# Patient Record
Sex: Female | Born: 1940 | Race: White | Hispanic: No | Smoking: Former smoker
Health system: Southern US, Community
[De-identification: ages and names within clinical notes are randomized; demographics above are authoritative.]

## PROBLEM LIST (undated history)

## (undated) ENCOUNTER — Emergency Department (HOSPITAL_COMMUNITY): Admission: EM | Payer: Medicare Other | Source: Home / Self Care

## (undated) DIAGNOSIS — F32A Depression, unspecified: Secondary | ICD-10-CM

## (undated) DIAGNOSIS — Z8709 Personal history of other diseases of the respiratory system: Secondary | ICD-10-CM

## (undated) DIAGNOSIS — I00 Rheumatic fever without heart involvement: Secondary | ICD-10-CM

## (undated) DIAGNOSIS — J189 Pneumonia, unspecified organism: Secondary | ICD-10-CM

## (undated) DIAGNOSIS — N289 Disorder of kidney and ureter, unspecified: Secondary | ICD-10-CM

## (undated) DIAGNOSIS — Z8669 Personal history of other diseases of the nervous system and sense organs: Secondary | ICD-10-CM

## (undated) DIAGNOSIS — Z860100 Personal history of colon polyps, unspecified: Secondary | ICD-10-CM

## (undated) DIAGNOSIS — M11261 Other chondrocalcinosis, right knee: Secondary | ICD-10-CM

## (undated) DIAGNOSIS — D649 Anemia, unspecified: Secondary | ICD-10-CM

## (undated) DIAGNOSIS — R062 Wheezing: Secondary | ICD-10-CM

## (undated) DIAGNOSIS — F329 Major depressive disorder, single episode, unspecified: Secondary | ICD-10-CM

## (undated) DIAGNOSIS — E785 Hyperlipidemia, unspecified: Secondary | ICD-10-CM

## (undated) DIAGNOSIS — M1990 Unspecified osteoarthritis, unspecified site: Secondary | ICD-10-CM

## (undated) DIAGNOSIS — F419 Anxiety disorder, unspecified: Secondary | ICD-10-CM

## (undated) DIAGNOSIS — R42 Dizziness and giddiness: Secondary | ICD-10-CM

## (undated) DIAGNOSIS — M2550 Pain in unspecified joint: Secondary | ICD-10-CM

## (undated) DIAGNOSIS — H269 Unspecified cataract: Secondary | ICD-10-CM

## (undated) DIAGNOSIS — I10 Essential (primary) hypertension: Secondary | ICD-10-CM

## (undated) HISTORY — PX: EYE SURGERY: SHX253

## (undated) HISTORY — PX: OTHER SURGICAL HISTORY: SHX169

## (undated) HISTORY — PX: CATARACT EXTRACTION: SUR2

## (undated) HISTORY — PX: ABDOMINAL HYSTERECTOMY: SHX81

## (undated) HISTORY — PX: CHOLECYSTECTOMY: SHX55

## (undated) HISTORY — PX: EXPLORATORY LAPAROTOMY: SUR591

## (undated) HISTORY — PX: CARDIAC CATHETERIZATION: SHX172

## (undated) HISTORY — PX: APPENDECTOMY: SHX54

---

## 1998-12-02 ENCOUNTER — Inpatient Hospital Stay (HOSPITAL_COMMUNITY): Admission: EM | Admit: 1998-12-02 | Discharge: 1998-12-03 | Payer: Self-pay | Admitting: Cardiology

## 2000-07-28 ENCOUNTER — Encounter: Payer: Self-pay | Admitting: Internal Medicine

## 2000-07-28 ENCOUNTER — Ambulatory Visit (HOSPITAL_COMMUNITY): Admission: RE | Admit: 2000-07-28 | Discharge: 2000-07-28 | Payer: Self-pay | Admitting: Internal Medicine

## 2000-12-19 ENCOUNTER — Ambulatory Visit (HOSPITAL_COMMUNITY): Admission: RE | Admit: 2000-12-19 | Discharge: 2000-12-19 | Payer: Self-pay | Admitting: Specialist

## 2000-12-19 ENCOUNTER — Encounter: Payer: Self-pay | Admitting: Specialist

## 2002-10-30 ENCOUNTER — Encounter: Payer: Self-pay | Admitting: Family Medicine

## 2002-10-30 ENCOUNTER — Ambulatory Visit (HOSPITAL_COMMUNITY): Admission: RE | Admit: 2002-10-30 | Discharge: 2002-10-30 | Payer: Self-pay | Admitting: Family Medicine

## 2003-01-03 ENCOUNTER — Ambulatory Visit (HOSPITAL_COMMUNITY): Admission: RE | Admit: 2003-01-03 | Discharge: 2003-01-03 | Payer: Self-pay | Admitting: Neurology

## 2003-01-27 ENCOUNTER — Ambulatory Visit (HOSPITAL_COMMUNITY): Admission: RE | Admit: 2003-01-27 | Discharge: 2003-01-27 | Payer: Self-pay | Admitting: Family Medicine

## 2003-01-30 ENCOUNTER — Encounter: Admission: RE | Admit: 2003-01-30 | Discharge: 2003-01-30 | Payer: Self-pay | Admitting: Family Medicine

## 2003-02-15 HISTORY — PX: COLONOSCOPY: SHX174

## 2003-07-08 ENCOUNTER — Ambulatory Visit (HOSPITAL_COMMUNITY): Admission: RE | Admit: 2003-07-08 | Discharge: 2003-07-08 | Payer: Self-pay | Admitting: Family Medicine

## 2003-12-03 ENCOUNTER — Ambulatory Visit (HOSPITAL_COMMUNITY): Admission: RE | Admit: 2003-12-03 | Discharge: 2003-12-03 | Payer: Self-pay | Admitting: Internal Medicine

## 2003-12-12 ENCOUNTER — Ambulatory Visit (HOSPITAL_COMMUNITY): Admission: RE | Admit: 2003-12-12 | Discharge: 2003-12-12 | Payer: Self-pay | Admitting: Internal Medicine

## 2004-01-01 ENCOUNTER — Ambulatory Visit: Payer: Self-pay | Admitting: Internal Medicine

## 2004-03-09 ENCOUNTER — Ambulatory Visit: Payer: Self-pay | Admitting: Cardiology

## 2004-04-14 ENCOUNTER — Ambulatory Visit: Payer: Self-pay | Admitting: Cardiology

## 2004-05-12 ENCOUNTER — Ambulatory Visit (HOSPITAL_COMMUNITY): Admission: RE | Admit: 2004-05-12 | Discharge: 2004-05-12 | Payer: Self-pay | Admitting: Urology

## 2004-09-13 ENCOUNTER — Ambulatory Visit (HOSPITAL_COMMUNITY): Admission: RE | Admit: 2004-09-13 | Discharge: 2004-09-13 | Payer: Self-pay | Admitting: Family Medicine

## 2004-12-23 ENCOUNTER — Ambulatory Visit: Payer: Self-pay | Admitting: Orthopedic Surgery

## 2004-12-27 ENCOUNTER — Ambulatory Visit (HOSPITAL_COMMUNITY): Admission: RE | Admit: 2004-12-27 | Discharge: 2004-12-27 | Payer: Self-pay | Admitting: Family Medicine

## 2005-02-09 ENCOUNTER — Ambulatory Visit (HOSPITAL_COMMUNITY): Admission: RE | Admit: 2005-02-09 | Discharge: 2005-02-09 | Payer: Self-pay | Admitting: Family Medicine

## 2005-07-08 ENCOUNTER — Ambulatory Visit: Payer: Self-pay | Admitting: Cardiology

## 2005-07-22 ENCOUNTER — Ambulatory Visit: Payer: Self-pay

## 2005-09-30 ENCOUNTER — Ambulatory Visit (HOSPITAL_COMMUNITY): Admission: RE | Admit: 2005-09-30 | Discharge: 2005-09-30 | Payer: Self-pay | Admitting: Family Medicine

## 2005-11-14 ENCOUNTER — Ambulatory Visit (HOSPITAL_COMMUNITY): Admission: RE | Admit: 2005-11-14 | Discharge: 2005-11-14 | Payer: Self-pay | Admitting: Family Medicine

## 2006-06-28 ENCOUNTER — Ambulatory Visit: Payer: Self-pay | Admitting: Cardiology

## 2006-06-28 LAB — CONVERTED CEMR LAB
ALT: 16 units/L (ref 0–40)
AST: 19 units/L (ref 0–37)
Albumin: 4.2 g/dL (ref 3.5–5.2)
Alkaline Phosphatase: 91 units/L (ref 39–117)
BUN: 14 mg/dL (ref 6–23)
Bilirubin, Direct: 0.1 mg/dL (ref 0.0–0.3)
CO2: 31 meq/L (ref 19–32)
Calcium: 9.6 mg/dL (ref 8.4–10.5)
Chloride: 106 meq/L (ref 96–112)
Cholesterol: 182 mg/dL (ref 0–200)
Creatinine, Ser: 0.7 mg/dL (ref 0.4–1.2)
GFR calc Af Amer: 107 mL/min
GFR calc non Af Amer: 89 mL/min
Glucose, Bld: 109 mg/dL — ABNORMAL HIGH (ref 70–99)
HDL: 49.5 mg/dL (ref >39.0)
LDL Cholesterol: 109 mg/dL — ABNORMAL HIGH (ref 0–99)
Potassium: 4.5 meq/L (ref 3.5–5.1)
Sodium: 142 meq/L (ref 135–145)
Total Bilirubin: 0.7 mg/dL (ref 0.3–1.2)
Total CHOL/HDL Ratio: 3.7
Total Protein: 7.7 g/dL (ref 6.0–8.3)
Triglycerides: 118 mg/dL (ref 0–149)
VLDL: 24 mg/dL (ref 0–40)

## 2006-12-12 ENCOUNTER — Ambulatory Visit (HOSPITAL_COMMUNITY): Admission: RE | Admit: 2006-12-12 | Discharge: 2006-12-12 | Payer: Self-pay | Admitting: Family Medicine

## 2007-09-03 ENCOUNTER — Ambulatory Visit: Payer: Self-pay | Admitting: Cardiology

## 2008-01-09 ENCOUNTER — Ambulatory Visit (HOSPITAL_COMMUNITY): Admission: RE | Admit: 2008-01-09 | Discharge: 2008-01-09 | Payer: Self-pay | Admitting: Internal Medicine

## 2008-01-21 ENCOUNTER — Ambulatory Visit (HOSPITAL_COMMUNITY): Admission: RE | Admit: 2008-01-21 | Discharge: 2008-01-21 | Payer: Self-pay | Admitting: Internal Medicine

## 2008-01-24 ENCOUNTER — Ambulatory Visit (HOSPITAL_COMMUNITY): Admission: RE | Admit: 2008-01-24 | Discharge: 2008-01-24 | Payer: Self-pay | Admitting: Internal Medicine

## 2008-11-24 ENCOUNTER — Encounter (INDEPENDENT_AMBULATORY_CARE_PROVIDER_SITE_OTHER): Payer: Self-pay | Admitting: *Deleted

## 2008-12-24 DIAGNOSIS — I059 Rheumatic mitral valve disease, unspecified: Secondary | ICD-10-CM | POA: Insufficient documentation

## 2009-01-07 ENCOUNTER — Ambulatory Visit: Payer: Self-pay | Admitting: Cardiology

## 2009-01-12 ENCOUNTER — Ambulatory Visit (HOSPITAL_COMMUNITY): Admission: RE | Admit: 2009-01-12 | Discharge: 2009-01-12 | Payer: Self-pay | Admitting: Internal Medicine

## 2009-06-25 ENCOUNTER — Ambulatory Visit (HOSPITAL_COMMUNITY): Admission: RE | Admit: 2009-06-25 | Discharge: 2009-06-25 | Payer: Self-pay | Admitting: Obstetrics and Gynecology

## 2009-12-16 ENCOUNTER — Ambulatory Visit: Payer: Self-pay | Admitting: Internal Medicine

## 2009-12-16 ENCOUNTER — Ambulatory Visit (HOSPITAL_COMMUNITY): Admission: RE | Admit: 2009-12-16 | Discharge: 2009-12-16 | Payer: Self-pay | Admitting: Internal Medicine

## 2009-12-17 ENCOUNTER — Encounter: Payer: Self-pay | Admitting: Cardiology

## 2009-12-17 ENCOUNTER — Ambulatory Visit: Payer: Self-pay | Admitting: Cardiology

## 2009-12-17 DIAGNOSIS — R0989 Other specified symptoms and signs involving the circulatory and respiratory systems: Secondary | ICD-10-CM

## 2009-12-24 ENCOUNTER — Ambulatory Visit (HOSPITAL_COMMUNITY): Admission: RE | Admit: 2009-12-24 | Discharge: 2009-12-24 | Payer: Self-pay | Admitting: Cardiology

## 2010-01-15 ENCOUNTER — Ambulatory Visit (HOSPITAL_COMMUNITY): Admission: RE | Admit: 2010-01-15 | Discharge: 2010-01-15 | Payer: Self-pay | Admitting: Internal Medicine

## 2010-03-16 NOTE — Assessment & Plan Note (Signed)
Summary: YEARLY./CY   Visit Type:  Follow-up Primary Provider:  Carylon Perches  CC:  little sob.  History of Present Illness: Mackenzie [name] comes in today for followup of her coronary disease, history of mitral valve prolapse. Her hyperlipidemia and hypertension are being managed by Dr. Ouida Sills.  Her blood pressure is under excellent control. She denies any symptoms of TIAs or mini strokes. He's had no angina or ischemic symptoms. She denies palpitations or chest pain.   Current Medications (verified): 1)  Aspirin 81 Mg Tbec (Aspirin) .... Take One Tablet By Mouth Daily 2)  Pravastatin Sodium 40 Mg Tabs (Pravastatin Sodium) .Marland Kitchen.. 1 By Mouth Daily 3)  Azor 5-40 Mg Tabs (Amlodipine-Olmesartan) .... Take One Daily 4)  Chlorthalidone 25 Mg Tabs (Chlorthalidone) .... Take One Daily  Allergies: No Known Drug Allergies  Past History:  Past Medical History: Last updated: 2009-01-20 MITRAL VALVE PROLAPSE (ICD-424.0) CAD, NATIVE VESSEL (ICD-414.01) HYPERTENSION (ICD-401.9) HYPERLIPIDEMIA (ICD-272.4)  Past Surgical History: Last updated: 01-20-2009 Total colonoscopy..12/03/2003.Lionel December, M.D. Hysterectomy Cholecystectomy  Family History: Last updated: 01/20/2009 Mother: deceased 33..heart trouble Siblings: 1 deceased at 30 Family History of Cancer:  Family History of Diabetes:  Family History of Hypertension:   Social History: Last updated: January 20, 2009 Alcohol Use - yes.Marland Kitchenon occasion Tobacco Use - Yes. quit more than 20+ yrs ago Drug Use - no  Risk Factors: Smoking Status: current (01-20-2009)  Review of Systems       negative other than history of present illness  Vital Signs:  Patient profile:   70 year old female Height:      60 inches Weight:      138 pounds BMI:     27.05 Pulse rate:   64 / minute Pulse rhythm:   regular BP sitting:   110 / 50  (right arm)  Vitals Entered By: Jacquelin Hawking, CMA (December 17, 2009 2:24 PM)  Physical Exam  General:  Well  developed, well nourished, in no acute distress. Head:  normocephalic and atraumatic Eyes:  PERRLA/EOM intact; conjunctiva and lids normal. Neck:  Neck supple, no JVD. No masses, thyromegaly or abnormal cervical nodes. Chest Wall:  no deformities or breast masses noted Lungs:  Clear bilaterally to auscultation and percussion. Heart:  PMI nondisplaced, normal S1-S2, no obvious click, regular rate and rhythm, left carotid bruit Abdomen:  Bowel sounds positive; abdomen soft and non-tender without masses, organomegaly, or hernias noted. No hepatosplenomegaly. Msk:  Back normal, normal gait. Muscle strength and tone normal. Pulses:  pulses normal in all 4 extremities Extremities:  No clubbing or cyanosis. Neurologic:  Alert and oriented x 3. Skin:  Intact without lesions or rashes. Psych:  Normal affect.   Impression & Recommendations:  Problem # 1:  CAROTID BRUIT, LEFT (ICD-785.9) Assessment New Will obtain carotid Dopplers in [place] Orders: Carotid Duplex (Carotid Duplex)  Problem # 2:  MITRAL VALVE PROLAPSE (ICD-424.0) Assessment: Unchanged  Her updated medication list for this problem includes:    Azor 5-40 Mg Tabs (Amlodipine-olmesartan) .Marland Kitchen... Take one daily    Chlorthalidone 25 Mg Tabs (Chlorthalidone) .Marland Kitchen... Take one daily  Problem # 3:  CAD, NATIVE VESSEL (ICD-414.01) Assessment: Unchanged clinically doing well, no change in medical treatment Her updated medication list for this problem includes:    Aspirin 81 Mg Tbec (Aspirin) .Marland Kitchen... Take one tablet by mouth daily    Azor 5-40 Mg Tabs (Amlodipine-olmesartan) .Marland Kitchen... Take one daily  Orders: EKG w/ Interpretation (93000)  Problem # 4:  HYPERTENSION (ICD-401.9) Assessment: Improved  Her updated  medication list for this problem includes:    Aspirin 81 Mg Tbec (Aspirin) .Marland Kitchen... Take one tablet by mouth daily    Azor 5-40 Mg Tabs (Amlodipine-olmesartan) .Marland Kitchen... Take one daily    Chlorthalidone 25 Mg Tabs (Chlorthalidone)  .Marland Kitchen... Take one daily  Problem # 5:  HYPERLIPIDEMIA (ICD-272.4)  Her updated medication list for this problem includes:    Pravastatin Sodium 40 Mg Tabs (Pravastatin sodium) .Marland Kitchen... 1 by mouth daily  Patient Instructions: 1)  Your physician recommends that you schedule a follow-up appointment in: 1 year with Dr. Daleen Squibb 2)  Your physician recommends that you continue on your current medications as directed. Please refer to the Current Medication list given to you today. 3)  Your physician has requested that you have a carotid duplex. This test is an ultrasound of the carotid arteries in your neck. It looks at blood flow through these arteries that supply the brain with blood. Allow one hour for this exam. There are no restrictions or special instructions.

## 2010-06-29 NOTE — Assessment & Plan Note (Signed)
[facility]                            CARDIOLOGY OFFICE NOTE   NAME:Keagle, Bernyce B                          MRN:          [id number]  DATE:09/03/2007                            DOB:          August 29, 1940    Ms. Sarracino comes to the office today for followup.  She has a history of  the following:  1. Nonobstructive coronary artery disease.  She has normal left      ventricular function.  Stress Myoview on July 22, 2005, EF 70%, no      ischemia.  Her cardiac catheterization was in October 2000.  2. Hypertension.  This is mostly with exertion.  She is on felodipine,      which she takes at night.  Her pressures have been fairly good at      home.  She is asymptomatic.  3. Hyperlipidemia.  She began to have problems with aches and pains      with a torn and came off it.  Her total cholesterol by Dr. Ouida Sills      was 280, LDL 193, HDL 50, and triglycerides 213 in May on no      therapy.  He placed her on pravastatin 20 mg daily and she is due      for followup blood work.  She noticed except inferior results with      a weaker dose statin and this may have to be titrated.  4. Mitral valve prolapse.   She has no specific complaints today.  She is clearly having no chest  pain, angina, or any other vascular symptoms.   CURRENT MEDICATIONS:  1. Baby aspirin 81 mg a day.  2. Felodipine 2.5 mg q.a.m.  3. Pravastatin 20 mg a day.  4. B12 injection monthly.  5. Calcium.  6. Vitamin D.  7. Multivitamin.   PHYSICAL EXAMINATION:  VITAL SIGNS:  Her blood pressure today is 156/67.  Her pulse is 68 and regular.  Her weight is 124.  HEENT:  Unchanged.  NECK:  Carotid upstrokes were equal bilateral without bruits.  No JVD.  Thyroid is not enlarged.  Trachea is midline.  LUNGS:  Clear.  HEART:  Regular rate and rhythm.  No gallop.  ABDOMEN:  Soft.  Good bowel sounds.  EXTREMITIES:  No edema.  Pulses are intact.  NEURO:  Intact.   I have had a long talk with Ms.  Holdman.  I have encouraged her to follow  with Dr. Ouida Sills concerning her lipids.  He may indeed have to increase  her pravastatin to 40, which I hope she can tolerate.  I told her any  statin is better than no statin.   We will plan on seeing her back again in a year.   She wanted a limit supply of her Xanax before she takes a trip to  [place].  I gave her alprazolam 0.5 mg #30 in total, no refills.     Thomas C. Daleen Squibb, MD, [facility]  Electronically Signed    TCW/MedQ  DD: 09/03/2007  DT: 09/04/2007  Job #: [id number]  cc:   Kingsley Callander. Ouida Sills, MD

## 2010-06-29 NOTE — Assessment & Plan Note (Signed)
[facility]                            CARDIOLOGY OFFICE NOTE   Mackenzie [name], Mackenzie [name]                         MRN:          [id number]  DATE:06/28/2006                            DOB:          [birth year]    Mackenzie [name] returns today for management of the following issues:   1. Nonobstructive coronary disease. She has normal left ventricular      systolic function. Stress Myoview July 22, 2005. EF 70%, no      ischemia. Hypertensive blood pressure response to exercise.  2. Hypertension. She is now on felodipine with pressures usually      around 130-180 systolic, 60s diastolic. She is asymptomatic.  3. Hyperlipidemia. She is due lipids and LFTs which she would like me      to draw today.  4. Mitral valve prolapse.   Other than some chronic back pain which she injures occasionally during  yard work, she has no complaints.   MEDICATIONS:  1. Baby aspirin 81 mg a day.  2. Vytorin 10/20 daily.  3. Felodipine 2.5 mg q.a.m.   PHYSICAL EXAMINATION:  VITAL SIGNS:  Her blood pressure today is 142/68.  Her pulse was 85 and regular. EKG is normal. Weight is 134, down 2.  HEENT:  Normocephalic, atraumatic. PERRLA. Extraocular movements intact.  Sclera clear. Facial symmetry is normal.  NECK:  Carotid upstrokes equal bilaterally without bruits. There is no  JVD. Thyroid is not enlarged, trachea is midline.  LUNGS:  Clear.  HEART:  Reveals a poorly appreciated PMI, normal S1, S2. No click or  gallop.  ABDOMEN:  Soft with good bowel sounds.  EXTREMITIES:  Reveal no edema. Pulses are intact.  NEUROLOGIC:  Intact.   ASSESSMENT/PLAN:  Mackenzie [name] is doing well. I have asked her to continue  with her current medications. I want her blood pressure on the average  less than 140 systolic. Will obtain a BNP, LFTs and a lipid panel today.  Assuming these are unremarkable and she is doing well, I will see her  back in a year.    Thomas C. Daleen Squibb, MD, [facility]  Electronically Signed   TCW/MedQ  DD: 06/28/2006  DT: 06/28/2006  Job #: [id number]   cc:   Patrica Duel, M.D.

## 2010-07-02 NOTE — Op Note (Signed)
NAMEElleigh, Mackenzie [name]                   ACCOUNT NO.:  [id number]   MEDICAL RECORD NO.:  [id number]          PATIENT TYPE:  AMB   LOCATION:  DAY                           FACILITY:  [facility]   PHYSICIAN:  Lionel December, M.D.    DATE OF BIRTH:  05/18/1940   DATE OF PROCEDURE:  12/03/2003  DATE OF DISCHARGE:                                 OPERATIVE REPORT   PROCEDURE:  Total colonoscopy.   INDICATION:  Khaila is a 70 year old, Caucasian female, who is undergoing  screening colonoscopy.  She has occasional hematochezia felt to be secondary  to hemorrhoids. Family history is strongly positive for colon carcinoma.  Mother died of it at age 77, and her maternal aunt and uncle also had colon  carcinoma.  Her last exam was in August 2000 and was normal.  Procedure  risks were reviewed with the patient.  Informed consent was obtained.   PREOPERATIVE MEDICATIONS:  1.  Demerol 50 mg IV.  2.  Versed 5 mg IV in divided dose.   FINDINGS:  Procedure performed in endoscopy suite.  The patient's vital  signs and O2 saturations were monitored during the procedure and remained  stable.  The patient was placed in left lateral decubitus position.  Rectal  examination performed.  No abnormality noted on external or digital exam.  Olympus video scope was placed in the rectum and advanced under vision into  sigmoid colon and beyond.  Preparation was satisfactory.  Scope was passed  in the cecum which was identified by appendiceal stump and ileocecal valve.  Pictures taken for the record.  As the scope was withdrawn, colonic mucosa  was carefully examined and was normal throughout.  Rectal mucosa similarly  was normal.  Scope was retroflexed to examine anorectal junction, and small  hemorrhoids were noted below the dentate line.  Endoscope was straightened  and withdrawn.  The patient tolerated the procedure well.   FINAL DIAGNOSES:  Normal colonoscopy except for small external hemorrhoids.   RECOMMENDATIONS:   Standard instructions given.  She should continue with  yearly Hemoccults and return for next screening exam in 5 years from now.     Naje   NR/MEDQ  D:  12/03/2003  T:  12/03/2003  Job:  [id number]   cc:   Richardean Canal, M.D.  [address]  [place], [place] [address]  Fax: [phone]   Patrica Duel, M.D.  [address], [address]  [place]  [place] [address]  Fax: [phone]

## 2010-07-02 NOTE — Procedures (Signed)
NAME:  Mackenzie [name]                           ACCOUNT NO.:  [id number]   MEDICAL RECORD NO.:  [id number]                   PATIENT TYPE:  OUT   LOCATION:  RAD                                  FACILITY:  [facility]   PHYSICIAN:  Bratenahl Bing, M.D.               DATE OF BIRTH:  [birth year]   DATE OF PROCEDURE:  01/03/2003  DATE OF DISCHARGE:                                  ECHOCARDIOGRAM   CLINICAL DATA:  A 70 year old woman with cerebrovascular accident.   M-MODE MEASUREMENTS:  Aorta 2.8.   Left atrium 3.9.   Septum 1.3.   Posterior wall 1.2.   Left ventricular diastole 4.1.   Left ventricular systole 3.3.   1. Technically adequate echocardiographic study.  2. Very mild left atrial enlargement; normal right atrial size.  3. Normal right ventricular size; moderate hypertrophy. Normal systolic     function.  4. Normal aortic valve; mild annular calcification.  5. Normal tricuspid valve; trivial regurgitation.  6. Mild mitral valve thickening; mild prolapse; mild annular calcification;     trivial regurgitation.  7. Normal pulmonic valve.  8. Normal internal dimension of the left ventricle; mild concentric left     ventricular hypertrophy. Normal regional and global systolic function.  9. Normal inferior vena cava.  10.      Thickening of the atrial septum, consistent with lipomatous     hypertrophy.  11.      Comparison to prior study of April 14, 1998: No significant     interval change.      ___________________________________________                                            Homedale Bing, M.D.   RR/MEDQ  D:  01/03/2003  T:  01/03/2003  Job:  [id number]

## 2010-09-02 ENCOUNTER — Emergency Department (HOSPITAL_COMMUNITY)
Admission: EM | Admit: 2010-09-02 | Discharge: 2010-09-02 | Disposition: A | Discharge status: Home / Self Care | Payer: Medicare Other | Attending: Emergency Medicine | Admitting: Emergency Medicine

## 2010-09-02 ENCOUNTER — Encounter: Payer: Self-pay | Admitting: Emergency Medicine

## 2010-09-02 ENCOUNTER — Other Ambulatory Visit: Payer: Self-pay

## 2010-09-02 ENCOUNTER — Emergency Department (HOSPITAL_COMMUNITY): Payer: Medicare Other

## 2010-09-02 DIAGNOSIS — I10 Essential (primary) hypertension: Secondary | ICD-10-CM | POA: Insufficient documentation

## 2010-09-02 DIAGNOSIS — R51 Headache: Secondary | ICD-10-CM | POA: Insufficient documentation

## 2010-09-02 DIAGNOSIS — R5381 Other malaise: Secondary | ICD-10-CM | POA: Insufficient documentation

## 2010-09-02 DIAGNOSIS — E785 Hyperlipidemia, unspecified: Secondary | ICD-10-CM | POA: Insufficient documentation

## 2010-09-02 DIAGNOSIS — R55 Syncope and collapse: Secondary | ICD-10-CM | POA: Insufficient documentation

## 2010-09-02 HISTORY — DX: Hyperlipidemia, unspecified: E78.5

## 2010-09-02 HISTORY — DX: Essential (primary) hypertension: I10

## 2010-09-02 LAB — CBC
HCT: 35.5 % — ABNORMAL LOW (ref 36.0–46.0)
Hemoglobin: 12.1 g/dL (ref 12.0–15.0)
MCH: 32.3 pg (ref 26.0–34.0)
MCHC: 34.1 g/dL (ref 30.0–36.0)
RDW: 12.6 % (ref 11.5–15.5)

## 2010-09-02 LAB — BASIC METABOLIC PANEL
BUN: 20 mg/dL (ref 6–23)
Chloride: 100 mEq/L (ref 96–112)
Creatinine, Ser: 0.72 mg/dL (ref 0.50–1.10)
GFR calc non Af Amer: >60 mL/min (ref >60)
Glucose, Bld: 96 mg/dL (ref 70–99)
Potassium: 4.3 mEq/L (ref 3.5–5.1)

## 2010-09-02 LAB — DIFFERENTIAL
Basophils Relative: 0 % (ref 0–1)
Eosinophils Absolute: 0.2 10*3/uL (ref 0.0–0.7)
Monocytes Absolute: 0.7 10*3/uL (ref 0.1–1.0)
Monocytes Relative: 9 % (ref 3–12)

## 2010-09-02 MED ORDER — SODIUM CHLORIDE 0.9 % IV SOLN
Freq: Once | INTRAVENOUS | Status: AC
  Administered 2010-09-02: 20:00:00 via INTRAVENOUS

## 2010-09-02 NOTE — ED Notes (Signed)
Pt states she still feels jittery. Has taken 1 0.5mg  xanax PTA.

## 2010-09-02 NOTE — ED Notes (Signed)
Pt ambulated to the restroom and did leave a urine sample . Mackenzie [name]

## 2010-09-02 NOTE — ED Notes (Signed)
Pt states she passed out last night in the bathroom and loss control of her bowels and urine and could not move. Pt was able to stand with help. Pt states today she feels jittery on the inside.

## 2010-09-02 NOTE — ED Provider Notes (Addendum)
History     Chief Complaint  Patient presents with  . Loss of Consciousness   The history is provided by the patient. No language interpreter was used.  Patient reports she collapsed last night while using the [place]room in [place] with associated urinary incontinence. Patient states she did not lose consciousness during the collapse or after and she was speaking with her friend following the collapse but was unable to move despite trying to get up after fall. States her friend was forced to pick her up and move her to the door. Says she was feeling completely normal prior to collapsing. Patient notes onset of a HA, not similar to previous migraines experienced in the past, a "jittery" feeling and generalized weakness this morning upon awaking which has been persistent since. Patient notes she recently had blood pressure medication changed approximately 3 weeks ago. Reports history of dizziness. Denies abdominal pain, chest pain, head injury, n/v and history of previous similar symptoms.   Patient seen at 7:58 PM   Past Medical History  Diagnosis Date  . Hypertension   . Hyperlipidemia     Past Surgical History  Procedure Date  . Cholecystectomy   . Abdominal hysterectomy     History reviewed. No pertinent family history.  History  Substance Use Topics  . Smoking status: Not on file  . Smokeless tobacco: Not on file  . Alcohol Use: Yes    OB History    Grav Para Term Preterm Abortions TAB SAB Ect Mult Living                  Review of Systems  Respiratory: Negative for shortness of breath.   Cardiovascular: Negative for chest pain.  Gastrointestinal: Negative for nausea, vomiting and abdominal pain.  Genitourinary:       Urinary incontinence.   Neurological: Positive for weakness and headaches. Negative for dizziness and syncope.  All other systems reviewed and are negative.  All other systems negative except as noted in HPI.   Physical Exam  BP 181/70  Pulse 99   Temp(Src) 98.7 F (37.1 C) (Oral)  Resp 16  Ht 5' (1.524 m)  Wt 138 lb (62.596 kg)  BMI 26.95 kg/m2  SpO2 98%  Physical Exam  Nursing note and vitals reviewed. Constitutional: She is oriented to person, place, and time. She appears well-developed and well-nourished.       Hypertensive.   HENT:  Head: Normocephalic and atraumatic.  Eyes: Conjunctivae are normal. Pupils are equal, round, and reactive to light.  Neck: Neck supple.  Cardiovascular: Normal rate, regular rhythm, intact distal pulses and normal pulses.  Exam reveals no gallop and no friction rub.   No murmur heard. Pulmonary/Chest: Effort normal.  Abdominal: Soft. Bowel sounds are normal. She exhibits no distension and no mass. There is no tenderness.  Musculoskeletal: Normal range of motion. She exhibits no edema.  Neurological: She is alert and oriented to person, place, and time. No sensory deficit.  Skin: Skin is warm and dry.  Psychiatric: She has a normal mood and affect. Her behavior is normal.    ED Course  Procedures  MDM   Ct Head Wo Contrast  09/02/2010  *RADIOLOGY REPORT*  Clinical Data: Weakness, headache, dizziness, hypertension  CT HEAD WITHOUT CONTRAST  Technique:  Contiguous axial images were obtained from the base of the skull through the vertex without contrast.  Comparison: None.  Findings: Atherosclerotic and physiologic intracranial calcifications. There is no evidence of acute intracranial hemorrhage, brain  edema, mass lesion, acute infarction,   mass effect, or midline shift. Acute infarct may be inapparent on noncontrast CT.  No other intra-axial abnormalities are seen, and the ventricles and sulci are within normal limits in size and symmetry.   No abnormal extra-axial fluid collections or masses are identified.  No significant calvarial abnormality.  IMPRESSION: 1. Negative for bleed or other acute intracranial process.  Original Report Authenticated By: Osa Craver, M.D.   Results for  orders placed during the hospital encounter of 09/02/10  BASIC METABOLIC PANEL      Component Value Range   Sodium 138  135 - 145 (mEq/L)   Potassium 4.3  3.5 - 5.1 (mEq/L)   Chloride 100  96 - 112 (mEq/L)   CO2 28  19 - 32 (mEq/L)   Glucose, Bld 96  70 - 99 (mg/dL)   BUN 20  6 - 23 (mg/dL)   Creatinine, Ser 7.82  0.50 - 1.10 (mg/dL)   Calcium 9.8  8.4 - 95.6 (mg/dL)   GFR calc non Af Amer >60  >60 (mL/min)   GFR calc Af Amer >60  >60 (mL/min)  CBC      Component Value Range   WBC 8.4  4.0 - 10.5 (K/uL)   RBC 3.75 (*) 3.87 - 5.11 (MIL/uL)   Hemoglobin 12.1  12.0 - 15.0 (g/dL)   HCT 21.3 (*) 08.6 - 46.0 (%)   MCV 94.7  78.0 - 100.0 (fL)   MCH 32.3  26.0 - 34.0 (pg)   MCHC 34.1  30.0 - 36.0 (g/dL)   RDW 57.8  46.9 - 62.9 (%)   Platelets 405 (*) 150 - 400 (K/uL)  DIFFERENTIAL      Component Value Range   Neutrophils Relative 50  43 - 77 (%)   Neutro Abs 4.2  1.7 - 7.7 (K/uL)   Lymphocytes Relative 38  12 - 46 (%)   Lymphs Abs 3.2  0.7 - 4.0 (K/uL)   Monocytes Relative 9  3 - 12 (%)   Monocytes Absolute 0.7  0.1 - 1.0 (K/uL)   Eosinophils Relative 3  0 - 5 (%)   Eosinophils Absolute 0.2  0.0 - 0.7 (K/uL)   Basophils Relative 0  0 - 1 (%)   Basophils Absolute 0.0  0.0 - 0.1 (K/uL)  TROPONIN I      Component Value Range   Troponin I <0.30  <0.30 (ng/mL)  CK      Component Value Range   Total CK 119  7 - 177 (U/L)    EKG: Normal Sinus Rhythm Rate=73 Axis in normal QRS is normal No ST/T wave changes.    Chart written by Clarita Crane acting as scribe for Nelia Shi, MD  I personally performed the services described in this documentation, which was scribed in my presence. The recorded information has been reviewed and considered.   Nelia Shi, MD 09/08/10 [id number]  Nelia Shi, MD 11/02/10 2223

## 2010-12-28 ENCOUNTER — Ambulatory Visit: Payer: 59 | Admitting: Cardiology

## 2011-01-10 ENCOUNTER — Other Ambulatory Visit (HOSPITAL_COMMUNITY): Payer: Self-pay | Admitting: Internal Medicine

## 2011-01-10 DIAGNOSIS — Z139 Encounter for screening, unspecified: Secondary | ICD-10-CM

## 2011-01-19 ENCOUNTER — Other Ambulatory Visit (HOSPITAL_COMMUNITY): Payer: Self-pay | Admitting: Internal Medicine

## 2011-01-19 DIAGNOSIS — Z139 Encounter for screening, unspecified: Secondary | ICD-10-CM

## 2011-01-20 ENCOUNTER — Ambulatory Visit (HOSPITAL_COMMUNITY)
Admission: RE | Admit: 2011-01-20 | Discharge: 2011-01-20 | Disposition: A | Discharge status: Home / Self Care | Payer: Medicare Other | Source: Ambulatory Visit | Attending: Internal Medicine | Admitting: Internal Medicine

## 2011-01-20 DIAGNOSIS — Z139 Encounter for screening, unspecified: Secondary | ICD-10-CM

## 2011-01-20 DIAGNOSIS — Z1231 Encounter for screening mammogram for malignant neoplasm of breast: Secondary | ICD-10-CM | POA: Insufficient documentation

## 2011-01-21 ENCOUNTER — Ambulatory Visit (HOSPITAL_COMMUNITY)
Admission: RE | Admit: 2011-01-21 | Discharge: 2011-01-21 | Disposition: A | Discharge status: Home / Self Care | Payer: Medicare Other | Source: Ambulatory Visit | Attending: Internal Medicine | Admitting: Internal Medicine

## 2011-01-21 DIAGNOSIS — Z13820 Encounter for screening for osteoporosis: Secondary | ICD-10-CM | POA: Insufficient documentation

## 2011-01-21 DIAGNOSIS — Z780 Asymptomatic menopausal state: Secondary | ICD-10-CM | POA: Insufficient documentation

## 2011-01-21 DIAGNOSIS — Z139 Encounter for screening, unspecified: Secondary | ICD-10-CM

## 2011-01-21 DIAGNOSIS — M899 Disorder of bone, unspecified: Secondary | ICD-10-CM | POA: Insufficient documentation

## 2011-01-31 ENCOUNTER — Encounter: Payer: Self-pay | Admitting: *Deleted

## 2011-02-02 ENCOUNTER — Ambulatory Visit (INDEPENDENT_AMBULATORY_CARE_PROVIDER_SITE_OTHER): Payer: Medicare Other | Admitting: Cardiology

## 2011-02-02 ENCOUNTER — Encounter: Payer: Self-pay | Admitting: Cardiology

## 2011-02-02 DIAGNOSIS — I2510 Atherosclerotic heart disease of native coronary artery without angina pectoris: Secondary | ICD-10-CM

## 2011-02-02 DIAGNOSIS — R0600 Dyspnea, unspecified: Secondary | ICD-10-CM

## 2011-02-02 DIAGNOSIS — E785 Hyperlipidemia, unspecified: Secondary | ICD-10-CM

## 2011-02-02 DIAGNOSIS — I10 Essential (primary) hypertension: Secondary | ICD-10-CM

## 2011-02-02 DIAGNOSIS — R0989 Other specified symptoms and signs involving the circulatory and respiratory systems: Secondary | ICD-10-CM

## 2011-02-02 DIAGNOSIS — I059 Rheumatic mitral valve disease, unspecified: Secondary | ICD-10-CM

## 2011-02-02 DIAGNOSIS — R0609 Other forms of dyspnea: Secondary | ICD-10-CM

## 2011-02-02 NOTE — Assessment & Plan Note (Signed)
Since she is unable to take statins, we'll continue with a low saturated fat diet. She also needs to lose significant weight.

## 2011-02-02 NOTE — Patient Instructions (Addendum)
Your physician has requested that you have a carotid duplex. This test is an ultrasound of the carotid arteries in your neck. It looks at blood flow through these arteries that supply the brain with blood. Allow one hour for this exam. There are no restrictions or special instructions.  Your physician has requested that you have a lexiscan myoview. For further information please visit [email or web address] Please follow instruction sheet, as given.  Your physician wants you to follow-up in: 1 year with Dr. Daleen Squibb. You will receive a reminder letter in the mail two months in advance. If you don't receive a letter, please call our office to schedule the follow-up appointment.  Your physician encouraged you to lose weight for better health.  A weight loss of 1 pound per week until you reach your ideal weight. Cholesterol Control Diet Cholesterol levels in your body are determined significantly by your diet. Cholesterol levels may also be related to heart disease. The following material helps to explain this relationship and discusses what you can do to help keep your heart healthy. Not all cholesterol is bad. Low-density lipoprotein (LDL) cholesterol is the "bad" cholesterol. It may cause fatty deposits to build up inside your arteries. High-density lipoprotein (HDL) cholesterol is "good." It helps to remove the "bad" LDL cholesterol from your blood. Cholesterol is a very important risk factor for heart disease. Other risk factors are high blood pressure, smoking, stress, heredity, and weight. The heart muscle gets its supply of blood through the coronary arteries. If your LDL cholesterol is high and your HDL cholesterol is low, you are at risk for having fatty deposits build up in your coronary arteries. This leaves less room through which blood can flow. Without sufficient blood and oxygen, the heart muscle cannot function properly and you may feel chest pains (angina pectoris). When a coronary artery closes up  entirely, a part of the heart muscle may die, causing a heart attack (myocardial infarction). CHECKING CHOLESTEROL When your caregiver sends your blood to a lab to be analyzed for cholesterol, a complete lipid (fat) profile may be done. With this test, the total amount of cholesterol and levels of LDL and HDL are determined. Triglycerides are a type of fat that circulates in the blood and can also be used to determine heart disease risk. The list below describes what the numbers should be: Test: Total Cholesterol.  Less than 200 mg/dl.  Test: LDL "bad cholesterol."  Less than 100 mg/dl.   Less than 70 mg/dl if you are at very high risk of a heart attack or sudden cardiac death.  Test: HDL "good cholesterol."  Greater than 50 mg/dl for women.   Greater than 40 mg/dl for men.  Test: Triglycerides.  Less than 150 mg/dl.  CONTROLLING CHOLESTEROL WITH DIET Although exercise and lifestyle factors are important, your diet is key. That is because certain foods are known to raise cholesterol and others to lower it. The goal is to balance foods for their effect on cholesterol and more importantly, to replace saturated and trans fat with other types of fat, such as monounsaturated fat, polyunsaturated fat, and omega-3 fatty acids. On average, a person should consume no more than 15 to 17 g of saturated fat daily. Saturated and trans fats are considered "bad" fats, and they will raise LDL cholesterol. Saturated fats are primarily found in animal products such as meats, butter, and cream. However, that does not mean you need to sacrifice all your favorite foods. Today, there are good tasting, low-fat,  low-cholesterol substitutes for most of the things you like to eat. Choose low-fat or nonfat alternatives. Choose round or loin cuts of red meat, since these types of cuts are lowest in fat and cholesterol. Chicken (without the skin), fish, veal, and ground [place] breast are excellent choices. Eliminate fatty  meats, such as hot dogs and salami. Even shellfish have little or no saturated fat. Have a 3 oz (85 g) portion when you eat lean meat, poultry, or fish. Trans fats are also called "partially hydrogenated oils." They are oils that have been scientifically manipulated so that they are solid at room temperature resulting in a longer shelf life and improved taste and texture of foods in which they are added. Trans fats are found in stick margarine, some tub margarines, cookies, crackers, and baked goods.  When baking and cooking, oils are an excellent substitute for butter. The monounsaturated oils are especially beneficial since it is believed they lower LDL and raise HDL. The oils you should avoid entirely are saturated tropical oils, such as coconut and palm.  Remember to eat liberally from food groups that are naturally free of saturated and trans fat, including fish, fruit, vegetables, beans, grains (barley, rice, couscous, bulgur wheat), and pasta (without cream sauces).  IDENTIFYING FOODS THAT LOWER CHOLESTEROL  Soluble fiber may lower your cholesterol. This type of fiber is found in fruits such as apples, vegetables such as broccoli, potatoes, and carrots, legumes such as beans, peas, and lentils, and grains such as barley. Foods fortified with plant sterols (phytosterol) may also lower cholesterol. You should eat at least 2 g per day of these foods for a cholesterol lowering effect.  Read package labels to identify low-saturated fats, trans fats free, and low-fat foods at the supermarket. Select cheeses that have only 2 to 3 g saturated fat per ounce. Use a heart-healthy tub margarine that is free of trans fats or partially hydrogenated oil. When buying baked goods (cookies, crackers), avoid partially hydrogenated oils. Breads and muffins should be made from whole grains (whole-wheat or whole oat flour, instead of "flour" or "enriched flour"). Buy non-creamy canned soups with reduced salt and no added  fats.  FOOD PREPARATION TECHNIQUES  Never deep-fry. If you must fry, either stir-fry, which uses very little fat, or use non-stick cooking sprays. When possible, broil, bake, or roast meats, and steam vegetables. Instead of dressing vegetables with butter or margarine, use lemon and herbs, applesauce and cinnamon (for squash and sweet potatoes), nonfat yogurt, salsa, and low-fat dressings for salads.  LOW-SATURATED FAT / LOW-FAT FOOD SUBSTITUTES Meats / Saturated Fat (g)  Avoid: Steak, marbled (3 oz/85 g) / 11 g   Choose: Steak, lean (3 oz/85 g) / 4 g   Avoid: Hamburger (3 oz/85 g) / 7 g   Choose: Hamburger, lean (3 oz/85 g) / 5 g   Avoid: Ham (3 oz/85 g) / 6 g   Choose: Ham, lean cut (3 oz/85 g) / 2.4 g   Avoid: Chicken, with skin, dark meat (3 oz/85 g) / 4 g   Choose: Chicken, skin removed, dark meat (3 oz/85 g) / 2 g   Avoid: Chicken, with skin, light meat (3 oz/85 g) / 2.5 g   Choose: Chicken, skin removed, light meat (3 oz/85 g) / 1 g  Dairy / Saturated Fat (g)  Avoid: Whole milk (1 cup) / 5 g   Choose: Low-fat milk, 2% (1 cup) / 3 g   Choose: Low-fat milk, 1% (1 cup) / 1.5  g   Choose: Skim milk (1 cup) / 0.3 g   Avoid: Hard cheese (1 oz/28 g) / 6 g   Choose: Skim milk cheese (1 oz/28 g) / 2 to 3 g   Avoid: Cottage cheese, 4% fat (1 cup) / 6.5 g   Choose: Low-fat cottage cheese, 1% fat (1 cup) / 1.5 g   Avoid: Ice cream (1 cup) / 9 g   Choose: Sherbet (1 cup) / 2.5 g   Choose: Nonfat frozen yogurt (1 cup) / 0.3 g   Choose: Frozen fruit bar / trace   Avoid: Whipped cream (1 tbs) / 3.5 g   Choose: Nondairy whipped topping (1 tbs) / 1 g  Condiments / Saturated Fat (g)  Avoid: Mayonnaise (1 tbs) / 2 g   Choose: Low-fat mayonnaise (1 tbs) / 1 g   Avoid: Butter (1 tbs) / 7 g   Choose: Extra light margarine (1 tbs) / 1 g   Avoid: Coconut oil (1 tbs) / 11.8 g   Choose: Olive oil (1 tbs) / 1.8 g   Choose: Corn oil (1 tbs) / 1.7 g   Choose: Safflower  oil (1 tbs) / 1.2 g   Choose: Sunflower oil (1 tbs) / 1.4 g   Choose: Soybean oil (1 tbs) / 2.4 g   Choose: Canola oil (1 tbs) / 1 g  Document Released: 01/31/2005 Document Revised: 10/13/2010 Document Reviewed: 07/22/2010 [facility] Patient Information 2012 [place], [place].Your physician discussed the importance of regular exercise and recommended that you start or continue a regular exercise program for good health.

## 2011-02-02 NOTE — Progress Notes (Signed)
HPI Mackenzie [name] comes in today with a chief complaint of dyspnea and exertion. She is known carotid disease and nonobstructive coronary disease. She is significant hyperlipidemia but cannot take statins. Her last cholesterol was 270 with an LDL of 180. Her HDL is 48.  She doesn't activity such as rushed to define concepts with you shortly. She denies a true angina.  Carotid Dopplers last year showed nonobstructive plaque.  Her blood pressures and elevated in the past as well but has come down with the addition of antidepressant. She says she cannot a lot of stress.  Past Medical History  Diagnosis Date  . Hypertension   . Hyperlipidemia   . Mitral valve disorders   . Coronary atherosclerosis of native coronary artery     Current Outpatient Prescriptions  Medication Sig Dispense Refill  . ALPRAZolam (XANAX) 0.5 MG tablet Take 0.5 mg by mouth 2 (two) times daily.        Marland Kitchen amLODipine-olmesartan (AZOR) 5-40 MG per tablet Take 1 tablet by mouth daily.        Marland Kitchen aspirin 81 MG tablet Take 81 mg by mouth daily.        . chlorthalidone (HYGROTON) 25 MG tablet Take 25 mg by mouth daily.        . citalopram (CELEXA) 10 MG tablet Take 10 mg by mouth as directed.        . metoprolol succinate (TOPROL-XL) 25 MG 24 hr tablet Take 25 mg by mouth daily.          No Known Allergies  Family History  Problem Relation Age of Onset  . Other Mother     heart problems  . Cancer      FH  . Diabetes      FH  . Hypertension      FH    History   Social History  . Marital Status: Widowed    Spouse Name: N/A    Number of Children: N/A  . Years of Education: N/A   Occupational History  . Not on file.   Social History Main Topics  . Smoking status: Former Games developer  . Smokeless tobacco: Never Used   Comment: quit 20 + yrs ago  . Alcohol Use: Yes     occasionally  . Drug Use: No  . Sexually Active: Not on file   Other Topics Concern  . Not on file   Social History Narrative  . No narrative  on file    ROS ALL NEGATIVE EXCEPT THOSE NOTED IN HPI  PE  General Appearance: well developed, well nourished in no acute distress, obese HEENT: symmetrical face, PERRLA, good dentition  Neck: no JVD, thyromegaly, or adenopathy, trachea midline Chest: symmetric without deformity Cardiac: PMI non-displaced, RRR, normal S1, S2, no gallop or murmur Lung: clear to ausculation and percussion Vascular: all pulses full, right carotid bruit  Abdominal: nondistended, nontender, good bowel sounds, no HSM, no bruits Extremities: no cyanosis, clubbing or edema, no sign of DVT, no varicosities  Skin: normal color, no rashes Neuro: alert and oriented x 3, non-focal Pysch: normal affect  EKG Sinus bradycardia, otherwise normal EKG BMET    Component Value Date/Time   NA 138 09/02/2010 1953   K 4.3 09/02/2010 1953   CL 100 09/02/2010 1953   CO2 28 09/02/2010 1953   GLUCOSE 96 09/02/2010 1953   BUN 20 09/02/2010 1953   CREATININE 0.72 09/02/2010 1953   CALCIUM 9.8 09/02/2010 1953   GFRNONAA >60 09/02/2010 1953  GFRAA >60 09/02/2010 1953    Lipid Panel     Component Value Date/Time   CHOL 182 06/28/2006 1226   TRIG 118 06/28/2006 1226   HDL 49.5 06/28/2006 1226   CHOLHDL 3.7 CALC 06/28/2006 1226   VLDL 24 06/28/2006 1226   LDLCALC 109* 06/28/2006 1226    CBC    Component Value Date/Time   WBC 8.4 09/02/2010 1953   RBC 3.75* 09/02/2010 1953   HGB 12.1 09/02/2010 1953   HCT 35.5* 09/02/2010 1953   PLT 405* 09/02/2010 1953   MCV 94.7 09/02/2010 1953   MCH 32.3 09/02/2010 1953   MCHC 34.1 09/02/2010 1953   RDW 12.6 09/02/2010 1953   LYMPHSABS 3.2 09/02/2010 1953   MONOABS 0.7 09/02/2010 1953   EOSABS 0.2 09/02/2010 1953   BASOSABS 0.0 09/02/2010 1953

## 2011-02-02 NOTE — Assessment & Plan Note (Signed)
Her dyspnea on exertion may be an anginal equivalent.  I will arrange for a stress Myoview for risk stratification.

## 2011-02-02 NOTE — Assessment & Plan Note (Signed)
Arrange carotid Dopplers. 

## 2011-02-03 NOTE — Progress Notes (Signed)
Addended by: Mylo Red F on: 02/03/2011 04:37 PM   Modules accepted: Orders

## 2011-02-17 ENCOUNTER — Encounter: Payer: Medicare Other | Admitting: *Deleted

## 2011-02-17 ENCOUNTER — Ambulatory Visit (HOSPITAL_COMMUNITY): Payer: Medicare Other | Attending: Cardiology | Admitting: Radiology

## 2011-02-17 VITALS — BP 150/54 | Ht 60.0 in | Wt 140.0 lb

## 2011-02-17 DIAGNOSIS — R5381 Other malaise: Secondary | ICD-10-CM | POA: Insufficient documentation

## 2011-02-17 DIAGNOSIS — R000 Tachycardia, unspecified: Secondary | ICD-10-CM | POA: Diagnosis not present

## 2011-02-17 DIAGNOSIS — I2510 Atherosclerotic heart disease of native coronary artery without angina pectoris: Secondary | ICD-10-CM

## 2011-02-17 DIAGNOSIS — Z87891 Personal history of nicotine dependence: Secondary | ICD-10-CM | POA: Diagnosis not present

## 2011-02-17 DIAGNOSIS — I059 Rheumatic mitral valve disease, unspecified: Secondary | ICD-10-CM | POA: Diagnosis not present

## 2011-02-17 DIAGNOSIS — R0602 Shortness of breath: Secondary | ICD-10-CM | POA: Diagnosis not present

## 2011-02-17 DIAGNOSIS — I10 Essential (primary) hypertension: Secondary | ICD-10-CM | POA: Diagnosis not present

## 2011-02-17 DIAGNOSIS — I779 Disorder of arteries and arterioles, unspecified: Secondary | ICD-10-CM | POA: Diagnosis not present

## 2011-02-17 DIAGNOSIS — Z8249 Family history of ischemic heart disease and other diseases of the circulatory system: Secondary | ICD-10-CM | POA: Insufficient documentation

## 2011-02-17 DIAGNOSIS — R42 Dizziness and giddiness: Secondary | ICD-10-CM | POA: Diagnosis not present

## 2011-02-17 DIAGNOSIS — R002 Palpitations: Secondary | ICD-10-CM | POA: Insufficient documentation

## 2011-02-17 DIAGNOSIS — E785 Hyperlipidemia, unspecified: Secondary | ICD-10-CM | POA: Insufficient documentation

## 2011-02-17 DIAGNOSIS — R0609 Other forms of dyspnea: Secondary | ICD-10-CM | POA: Insufficient documentation

## 2011-02-17 DIAGNOSIS — R0989 Other specified symptoms and signs involving the circulatory and respiratory systems: Secondary | ICD-10-CM | POA: Insufficient documentation

## 2011-02-17 DIAGNOSIS — R0600 Dyspnea, unspecified: Secondary | ICD-10-CM

## 2011-02-17 MED ORDER — REGADENOSON 0.4 MG/5ML IV SOLN
0.4000 mg | Freq: Once | INTRAVENOUS | Status: AC
  Administered 2011-02-17: 0.4 mg via INTRAVENOUS

## 2011-02-17 MED ORDER — TECHNETIUM TC 99M TETROFOSMIN IV KIT
11.0000 | PACK | Freq: Once | INTRAVENOUS | Status: AC | PRN
  Administered 2011-02-17: 11 via INTRAVENOUS

## 2011-02-17 MED ORDER — TECHNETIUM TC 99M TETROFOSMIN IV KIT
33.0000 | PACK | Freq: Once | INTRAVENOUS | Status: AC | PRN
  Administered 2011-02-17: 33 via INTRAVENOUS

## 2011-02-17 NOTE — Progress Notes (Signed)
[facility] SITE 3 NUCLEAR MED [address] [place] [address] [phone]  Cardiology Nuclear Med Study  Mackenzie [name] is a 71 y.o. female [id number] [birth year]   Nuclear Med Background Indication for Stress Test:  Evaluation for Ischemia History:  MVP and '00 Cath:N/O CAD; '04 Echo:Normal LVF; '07 MPS:No ischemia, EF=70% Cardiac Risk Factors: Carotid Disease, Family History - CAD, History of Smoking, Hypertension and Lipids  Symptoms:  Dizziness, DOE, Fatigue, Palpitations and Rapid HR   Nuclear Pre-Procedure Caffeine/Decaff Intake:  None NPO After: 4:30 pm yesterday   Lungs:  Clear.  O2 SAT 98% on RA. IV 0.9% NS with Angio Cath:  22g  IV Site: L Antecubital x 1, tolerated well IV Started by:  Irean Hong, RN  Chest Size (in):  46 Cup Size: D  Height: 5' (1.524 m)  Weight:  140 lb (63.504 kg)  BMI:  Body mass index is 27.34 kg/(m^2). Tech Comments:  Toprol held x 36 hours, no am meds. Taken today    Nuclear Med Study 1 or 2 day study: 1 day  Stress Test Type:  Treadmill/Lexiscan  Reading MD: Olga Millers, MD  Order Authorizing Provider:  Valera Castle, MD  Resting Radionuclide: Technetium 48m Tetrofosmin  Resting Radionuclide Dose: 11.0 mCi   Stress Radionuclide:  Technetium 56m Tetrofosmin  Stress Radionuclide Dose: 33.0 mCi           Stress Protocol Rest HR: 62 Stress HR: 114  Rest BP: 150/54 Stress BP: **224/95  Exercise Time (min): 2:00 METS: n/a   Predicted Max HR: 148 bpm % Max HR: 77.03 bpm Rate Pressure Product: 21308   Dose of Adenosine (mg):  n/a Dose of Lexiscan: 0.4 mg  Dose of Atropine (mg): n/a Dose of Dobutamine: n/a mcg/kg/min (at max HR)  Stress Test Technologist: Smiley Houseman, CMA-N  Nuclear Technologist:  Domenic Polite, CNMT     Rest Procedure:  Myocardial perfusion imaging was performed at rest 45 minutes following the intravenous administration of Technetium 41m Tetrofosmin.  Rest ECG: No acute changes.  Stress  Procedure:  The patient received IV Lexiscan 0.4 mg over 15-seconds with concurrent low level exercise and then Technetium 53m Tetrofosmin was injected at 30-seconds while the patient continued walking one more minute.  There were no significant changes with Lexiscan.  She did have a hypertensive response to Lexiscan, 224/95, an she c/o neck tightness.  Quantitative spect images were obtained after a 45-minute delay.  Stress ECG: No significant ST segment change suggestive of ischemia.  QPS Raw Data Images:  Acquisition technically good; normal left ventricular size. Stress Images:  There is decreased uptake in the apex. Rest Images:  There is decreased uptake in the apex. Subtraction (SDS):  No evidence of ischemia. Transient Ischemic Dilatation (Normal <1.22):  1.11 Lung/Heart Ratio (Normal <0.45):  0.32  Quantitative Gated Spect Images QGS EDV:  62 ml QGS ESV:  18 ml QGS cine images:  NL LV Function; NL Wall Motion QGS EF: 71%  Impression Exercise Capacity:  Lexiscan with low level exercise. BP Response:  Hypertensive blood pressure response. Clinical Symptoms:  Neck tightness with infusion ECG Impression:  No significant ST segment change suggestive of ischemia. Comparison with Prior Nuclear Study: Apical defect slightly more prominent compared to previous  Overall Impression:  Normal stress nuclear study with a small fixed apical defect suggestive of soft tissue attenuation; no ischemia.   Olga Millers

## 2011-02-18 ENCOUNTER — Encounter (INDEPENDENT_AMBULATORY_CARE_PROVIDER_SITE_OTHER): Payer: Medicare Other | Admitting: *Deleted

## 2011-02-18 DIAGNOSIS — I6529 Occlusion and stenosis of unspecified carotid artery: Secondary | ICD-10-CM | POA: Diagnosis not present

## 2011-02-18 DIAGNOSIS — R0989 Other specified symptoms and signs involving the circulatory and respiratory systems: Secondary | ICD-10-CM

## 2011-02-21 ENCOUNTER — Telehealth: Payer: Self-pay | Admitting: *Deleted

## 2011-02-21 NOTE — Telephone Encounter (Signed)
Notified pt of nuclear stress test results.

## 2011-03-17 DIAGNOSIS — I10 Essential (primary) hypertension: Secondary | ICD-10-CM | POA: Diagnosis not present

## 2011-03-17 DIAGNOSIS — F329 Major depressive disorder, single episode, unspecified: Secondary | ICD-10-CM | POA: Diagnosis not present

## 2011-06-23 DIAGNOSIS — I10 Essential (primary) hypertension: Secondary | ICD-10-CM | POA: Diagnosis not present

## 2011-06-23 DIAGNOSIS — F329 Major depressive disorder, single episode, unspecified: Secondary | ICD-10-CM | POA: Diagnosis not present

## 2011-07-19 DIAGNOSIS — Z961 Presence of intraocular lens: Secondary | ICD-10-CM | POA: Diagnosis not present

## 2011-07-19 DIAGNOSIS — H2510 Age-related nuclear cataract, unspecified eye: Secondary | ICD-10-CM | POA: Diagnosis not present

## 2011-07-19 DIAGNOSIS — H35379 Puckering of macula, unspecified eye: Secondary | ICD-10-CM | POA: Diagnosis not present

## 2011-09-28 DIAGNOSIS — I10 Essential (primary) hypertension: Secondary | ICD-10-CM | POA: Diagnosis not present

## 2011-09-28 DIAGNOSIS — F329 Major depressive disorder, single episode, unspecified: Secondary | ICD-10-CM | POA: Diagnosis not present

## 2011-12-16 DIAGNOSIS — Z23 Encounter for immunization: Secondary | ICD-10-CM | POA: Diagnosis not present

## 2011-12-16 DIAGNOSIS — J209 Acute bronchitis, unspecified: Secondary | ICD-10-CM | POA: Diagnosis not present

## 2012-01-02 DIAGNOSIS — J189 Pneumonia, unspecified organism: Secondary | ICD-10-CM | POA: Diagnosis not present

## 2012-01-03 ENCOUNTER — Encounter (HOSPITAL_COMMUNITY): Payer: Self-pay

## 2012-01-03 ENCOUNTER — Ambulatory Visit (HOSPITAL_COMMUNITY)
Admission: RE | Admit: 2012-01-03 | Discharge: 2012-01-03 | Disposition: A | Discharge status: Home / Self Care | Payer: Medicare Other | Source: Ambulatory Visit | Attending: Internal Medicine | Admitting: Internal Medicine

## 2012-01-03 ENCOUNTER — Other Ambulatory Visit (HOSPITAL_COMMUNITY): Payer: Self-pay | Admitting: Internal Medicine

## 2012-01-03 DIAGNOSIS — R059 Cough, unspecified: Secondary | ICD-10-CM | POA: Insufficient documentation

## 2012-01-03 DIAGNOSIS — R509 Fever, unspecified: Secondary | ICD-10-CM | POA: Diagnosis not present

## 2012-01-03 DIAGNOSIS — J449 Chronic obstructive pulmonary disease, unspecified: Secondary | ICD-10-CM | POA: Insufficient documentation

## 2012-01-03 DIAGNOSIS — R05 Cough: Secondary | ICD-10-CM

## 2012-01-03 DIAGNOSIS — J4489 Other specified chronic obstructive pulmonary disease: Secondary | ICD-10-CM | POA: Insufficient documentation

## 2012-01-09 DIAGNOSIS — B9789 Other viral agents as the cause of diseases classified elsewhere: Secondary | ICD-10-CM | POA: Diagnosis not present

## 2012-01-15 DIAGNOSIS — D649 Anemia, unspecified: Secondary | ICD-10-CM

## 2012-01-15 HISTORY — DX: Anemia, unspecified: D64.9

## 2012-01-31 ENCOUNTER — Encounter (HOSPITAL_COMMUNITY): Payer: Self-pay | Admitting: Emergency Medicine

## 2012-01-31 ENCOUNTER — Emergency Department (HOSPITAL_COMMUNITY): Payer: Medicare Other

## 2012-01-31 ENCOUNTER — Other Ambulatory Visit: Payer: Self-pay

## 2012-01-31 ENCOUNTER — Encounter (HOSPITAL_COMMUNITY): Payer: Self-pay | Admitting: Cardiology

## 2012-01-31 ENCOUNTER — Observation Stay (HOSPITAL_COMMUNITY)
Admission: EM | Admit: 2012-01-31 | Discharge: 2012-02-01 | Disposition: A | Discharge status: Home / Self Care | Payer: Medicare Other | Attending: Internal Medicine | Admitting: Internal Medicine

## 2012-01-31 DIAGNOSIS — I7090 Unspecified atherosclerosis: Secondary | ICD-10-CM

## 2012-01-31 DIAGNOSIS — I2510 Atherosclerotic heart disease of native coronary artery without angina pectoris: Secondary | ICD-10-CM | POA: Diagnosis not present

## 2012-01-31 DIAGNOSIS — R079 Chest pain, unspecified: Principal | ICD-10-CM | POA: Insufficient documentation

## 2012-01-31 DIAGNOSIS — I059 Rheumatic mitral valve disease, unspecified: Secondary | ICD-10-CM | POA: Diagnosis present

## 2012-01-31 DIAGNOSIS — D649 Anemia, unspecified: Secondary | ICD-10-CM | POA: Diagnosis present

## 2012-01-31 DIAGNOSIS — R0602 Shortness of breath: Secondary | ICD-10-CM | POA: Diagnosis not present

## 2012-01-31 DIAGNOSIS — I679 Cerebrovascular disease, unspecified: Secondary | ICD-10-CM | POA: Diagnosis present

## 2012-01-31 DIAGNOSIS — I10 Essential (primary) hypertension: Secondary | ICD-10-CM | POA: Diagnosis not present

## 2012-01-31 DIAGNOSIS — I200 Unstable angina: Secondary | ICD-10-CM | POA: Diagnosis present

## 2012-01-31 DIAGNOSIS — R0789 Other chest pain: Secondary | ICD-10-CM | POA: Diagnosis not present

## 2012-01-31 DIAGNOSIS — E785 Hyperlipidemia, unspecified: Secondary | ICD-10-CM | POA: Diagnosis not present

## 2012-01-31 DIAGNOSIS — I38 Endocarditis, valve unspecified: Secondary | ICD-10-CM | POA: Diagnosis not present

## 2012-01-31 HISTORY — DX: Anemia, unspecified: D64.9

## 2012-01-31 LAB — LIPID PANEL
HDL: 56 mg/dL (ref >39)
LDL Cholesterol: 184 mg/dL — ABNORMAL HIGH (ref 0–99)
Triglycerides: 152 mg/dL — ABNORMAL HIGH (ref <150)
VLDL: 30 mg/dL (ref 0–40)

## 2012-01-31 LAB — BASIC METABOLIC PANEL
BUN: 26 mg/dL — ABNORMAL HIGH (ref 6–23)
Calcium: 10 mg/dL (ref 8.4–10.5)
Chloride: 101 mEq/L (ref 96–112)
Creatinine, Ser: 0.96 mg/dL (ref 0.50–1.10)
GFR calc Af Amer: 66 mL/min — ABNORMAL LOW (ref >90)
GFR calc non Af Amer: 57 mL/min — ABNORMAL LOW (ref >90)

## 2012-01-31 LAB — CBC
HCT: 32 % — ABNORMAL LOW (ref 36.0–46.0)
MCH: 31.5 pg (ref 26.0–34.0)
MCHC: 33.1 g/dL (ref 30.0–36.0)
MCV: 95 fL (ref 78.0–100.0)
RDW: 13.8 % (ref 11.5–15.5)

## 2012-01-31 LAB — CK TOTAL AND CKMB (NOT AT ARMC)
CK, MB: 1.3 ng/mL (ref 0.3–4.0)
Relative Index: INVALID (ref 0.0–2.5)
Total CK: 58 U/L (ref 7–177)

## 2012-01-31 LAB — URINALYSIS, ROUTINE W REFLEX MICROSCOPIC
Bilirubin Urine: NEGATIVE
Hgb urine dipstick: NEGATIVE
Ketones, ur: NEGATIVE mg/dL
Specific Gravity, Urine: <1.005 — ABNORMAL LOW (ref 1.005–1.030)
pH: 7 (ref 5.0–8.0)

## 2012-01-31 LAB — FERRITIN: Ferritin: 120 ng/mL (ref 10–291)

## 2012-01-31 LAB — IRON AND TIBC
Iron: 53 ug/dL (ref 42–135)
Saturation Ratios: 16 % — ABNORMAL LOW (ref 20–55)
UIBC: 286 ug/dL (ref 125–400)

## 2012-01-31 LAB — MAGNESIUM: Magnesium: 1.8 mg/dL (ref 1.5–2.5)

## 2012-01-31 LAB — FOLATE: Folate: >20 ng/mL

## 2012-01-31 MED ORDER — METOPROLOL TARTRATE 25 MG PO TABS
12.5000 mg | ORAL_TABLET | Freq: Two times a day (BID) | ORAL | Status: DC
  Administered 2012-01-31 – 2012-02-01 (×3): 12.5 mg via ORAL
  Filled 2012-01-31 (×3): qty 1

## 2012-01-31 MED ORDER — TRAZODONE HCL 50 MG PO TABS: 25.0000 mg | ORAL_TABLET | Freq: Every evening | ORAL | Status: DC | PRN

## 2012-01-31 MED ORDER — IRBESARTAN 300 MG PO TABS
300.0000 mg | ORAL_TABLET | Freq: Every day | ORAL | Status: DC
  Administered 2012-01-31 – 2012-02-01 (×2): 300 mg via ORAL
  Filled 2012-01-31 (×2): qty 1

## 2012-01-31 MED ORDER — HEPARIN (PORCINE) IN NACL 100-0.45 UNIT/ML-% IJ SOLN
12.0000 [IU]/kg/h | INTRAMUSCULAR | Status: DC
  Administered 2012-01-31: 12 [IU]/kg/h via INTRAVENOUS
  Filled 2012-01-31: qty 250

## 2012-01-31 MED ORDER — POLYETHYLENE GLYCOL 3350 17 G PO PACK: 17.0000 g | PACK | Freq: Every day | ORAL | Status: DC | PRN

## 2012-01-31 MED ORDER — ALUM & MAG HYDROXIDE-SIMETH 200-200-20 MG/5ML PO SUSP: 30.0000 mL | Freq: Four times a day (QID) | ORAL | Status: DC | PRN

## 2012-01-31 MED ORDER — CITALOPRAM HYDROBROMIDE 20 MG PO TABS
10.0000 mg | ORAL_TABLET | Freq: Every day | ORAL | Status: DC
  Administered 2012-01-31 – 2012-02-01 (×2): 10 mg via ORAL
  Filled 2012-01-31: qty 2
  Filled 2012-01-31: qty 1

## 2012-01-31 MED ORDER — METOPROLOL TARTRATE 25 MG PO TABS
25.0000 mg | ORAL_TABLET | Freq: Once | ORAL | Status: AC
  Administered 2012-01-31: 25 mg via ORAL
  Filled 2012-01-31: qty 1

## 2012-01-31 MED ORDER — NITROGLYCERIN 0.4 MG SL SUBL: 0.4000 mg | SUBLINGUAL_TABLET | SUBLINGUAL | Status: DC | PRN

## 2012-01-31 MED ORDER — ASPIRIN EC 81 MG PO TBEC
81.0000 mg | DELAYED_RELEASE_TABLET | Freq: Every day | ORAL | Status: DC
  Administered 2012-01-31 – 2012-02-01 (×2): 81 mg via ORAL
  Filled 2012-01-31 (×2): qty 1

## 2012-01-31 MED ORDER — ALPRAZOLAM 0.5 MG PO TABS
0.5000 mg | ORAL_TABLET | Freq: Two times a day (BID) | ORAL | Status: DC
  Administered 2012-01-31: 0.5 mg via ORAL
  Filled 2012-01-31 (×2): qty 1

## 2012-01-31 MED ORDER — AMLODIPINE BESYLATE 5 MG PO TABS
5.0000 mg | ORAL_TABLET | Freq: Every day | ORAL | Status: DC
  Administered 2012-01-31 – 2012-02-01 (×2): 5 mg via ORAL
  Filled 2012-01-31 (×2): qty 1

## 2012-01-31 MED ORDER — NITROGLYCERIN IN D5W 200-5 MCG/ML-% IV SOLN
10.0000 ug/min | Freq: Once | INTRAVENOUS | Status: AC
  Administered 2012-01-31: 5 ug/min via INTRAVENOUS
  Filled 2012-01-31: qty 250

## 2012-01-31 MED ORDER — PANTOPRAZOLE SODIUM 40 MG PO TBEC
40.0000 mg | DELAYED_RELEASE_TABLET | Freq: Two times a day (BID) | ORAL | Status: DC
  Administered 2012-01-31 – 2012-02-01 (×2): 40 mg via ORAL
  Filled 2012-01-31 (×2): qty 1

## 2012-01-31 MED ORDER — NITROGLYCERIN 0.4 MG SL SUBL
SUBLINGUAL_TABLET | SUBLINGUAL | Status: AC
  Administered 2012-01-31: 01:00:00
  Filled 2012-01-31: qty 25

## 2012-01-31 MED ORDER — AMLODIPINE-OLMESARTAN 5-40 MG PO TABS: 1.0000 | ORAL_TABLET | Freq: Every day | ORAL | Status: DC

## 2012-01-31 MED ORDER — SORBITOL 70 % SOLN
30.0000 mL | Freq: Every day | Status: DC | PRN
  Filled 2012-01-31: qty 30

## 2012-01-31 MED ORDER — ATORVASTATIN CALCIUM 40 MG PO TABS
40.0000 mg | ORAL_TABLET | Freq: Every day | ORAL | Status: DC
  Administered 2012-01-31: 40 mg via ORAL
  Filled 2012-01-31: qty 1

## 2012-01-31 MED ORDER — ONDANSETRON HCL 4 MG/2ML IJ SOLN: 4.0000 mg | INTRAMUSCULAR | Status: DC | PRN

## 2012-01-31 MED ORDER — ONDANSETRON HCL 4 MG/2ML IJ SOLN
4.0000 mg | Freq: Once | INTRAMUSCULAR | Status: AC
  Administered 2012-01-31: 4 mg via INTRAVENOUS
  Filled 2012-01-31: qty 2

## 2012-01-31 MED ORDER — CLOPIDOGREL BISULFATE 300 MG PO TABS
300.0000 mg | ORAL_TABLET | Freq: Once | ORAL | Status: AC
  Administered 2012-01-31: 300 mg via ORAL
  Filled 2012-01-31: qty 1

## 2012-01-31 MED ORDER — HEPARIN SODIUM (PORCINE) 5000 UNIT/ML IJ SOLN
60.0000 [IU]/kg | Freq: Once | INTRAMUSCULAR | Status: AC
  Administered 2012-01-31: 3850 [IU] via INTRAVENOUS

## 2012-01-31 MED ORDER — FAMOTIDINE IN NACL 20-0.9 MG/50ML-% IV SOLN
20.0000 mg | Freq: Once | INTRAVENOUS | Status: AC
  Administered 2012-01-31: 20 mg via INTRAVENOUS
  Filled 2012-01-31: qty 50

## 2012-01-31 MED ORDER — ENOXAPARIN SODIUM 40 MG/0.4ML ~~LOC~~ SOLN
40.0000 mg | SUBCUTANEOUS | Status: DC
  Administered 2012-01-31: 40 mg via SUBCUTANEOUS
  Filled 2012-01-31 (×2): qty 0.4

## 2012-01-31 MED ORDER — ACETAMINOPHEN 325 MG PO TABS: 650.0000 mg | ORAL_TABLET | ORAL | Status: DC | PRN

## 2012-01-31 MED ORDER — SODIUM CHLORIDE 0.9 % IJ SOLN
3.0000 mL | Freq: Two times a day (BID) | INTRAMUSCULAR | Status: DC
  Administered 2012-01-31: 3 mL via INTRAVENOUS

## 2012-01-31 MED ORDER — ASPIRIN 81 MG PO TABS
81.0000 mg | ORAL_TABLET | Freq: Every day | ORAL | Status: DC
  Filled 2012-01-31 (×2): qty 1

## 2012-01-31 MED ORDER — PANTOPRAZOLE SODIUM 40 MG IV SOLR
40.0000 mg | Freq: Once | INTRAVENOUS | Status: AC
  Filled 2012-01-31: qty 40

## 2012-01-31 NOTE — ED Notes (Signed)
Comfortable, pain free. Sitting upright talking with family

## 2012-01-31 NOTE — ED Notes (Signed)
Patient presents to ER with c/o sharp chest pain that woke her up at 2330.  Patient states she took a baby aspirin before she went to bed.  Patient states she took 2 ASA 325mg  before coming.  Patient A&O; skin w/d.  Respirations even and unlabored; able to speak in complete sentences without difficulty.

## 2012-01-31 NOTE — Consult Note (Signed)
Patient Name: Mackenzie [name]  MRN: [id number]  HPI: DEMIAH GULLICKSON is an 71 y.o. female referred for consultation by Osborne Casco, MD for chest pain.  This nice woman has been a patient followed by Dr. Valera Castle for cardiovascular risk factors and nonsignificant coronary disease at cardiac catheterization approximately 13 years ago. Her most recent stress test was 6 years ago and was negative. She notes occasional mild chest heaviness that she generally ignores, but was awakened the night of admission with severe anterior chest pressure accompanied by diaphoresis. She subsequently noted dyspnea nausea as well as a sensation of fullness in her throat. She took aspirin at home and came to the emergency department where sublingual nitroglycerin resulted in substantial improvement. Initial EKGs and cardiac markers have been negative.  Since admission to the hospital, she has felt fine.  Past Medical History  Diagnosis Date  . Hypertension   . Hyperlipidemia   . Mitral valve prolapse     Mitral valve prolapse on echocardiogram in 2004 along with RVH and LVH  . Arteriosclerotic cardiovascular disease (ASCVD)     Cardiac catheterization in 2000: Report is unavailable-chart notes indicate insignificant coronary artery disease  . Colonic polyp     X3 in 2011 along with an ulceration of the cecum  . Anemia, normocytic normochromic 01/2012    H&H-10.6/32; low normal iron, normal ferritin, saturation-16%   Past Surgical History  Procedure Date  . Cholecystectomy   . Abdominal hysterectomy   . Colonoscopy 2005    Negative screening study  . Colonoscopy w/ polypectomy 2011    Cecal ulcer; tubular adenoma x3   Family History  Problem Relation Age of Onset  . Heart attack Mother 15    heart problems  . Cancer      FH  . Diabetes      FH  . Hypertension      FH  . Cancer Mother     uterus  . Pancreatitis Sister     died  age 40  . Diabetes Son   . Diabetes Daughter    Social History:  reports  that she quit smoking about 25 years ago. She has never used smokeless tobacco. She reports that she drinks alcohol. She reports that she does not use illicit drugs.  Allergies: No Known Allergies  I have reviewed the patient's current medications.    . ALPRAZolam  0.5 mg Oral BID  . amLODipine  5 mg Oral Daily  . aspirin EC  81 mg Oral Daily  . citalopram  10 mg Oral Daily  . enoxaparin (LOVENOX) injection  40 mg Subcutaneous Q24H  . irbesartan  300 mg Oral Daily  . metoprolol tartrate  12.5 mg Oral BID  . pantoprazole (PROTONIX) IV  40 mg Intravenous Once   Followed by  . pantoprazole  40 mg Oral BID AC  . sodium chloride  3 mL Intravenous Q12H   Results for orders placed during the hospital encounter of 01/31/12 (from the past 48 hour(s))  CBC     Status: Abnormal   Collection Time   01/31/12 12:54 AM      Component Value Range Comment   WBC 7.0  4.0 - 10.5 K/uL    RBC 3.37 (*) 3.87 - 5.11 MIL/uL    Hemoglobin 10.6 (*) 12.0 - 15.0 g/dL    HCT 40.9 (*) 81.1 - 46.0 %    MCV 95.0  78.0 - 100.0 fL    MCH 31.5  26.0 -  34.0 pg    MCHC 33.1  30.0 - 36.0 g/dL    RDW 11.9  14.7 - 82.9 %    Platelets 268  150 - 400 K/uL   BASIC METABOLIC PANEL     Status: Abnormal   Collection Time   01/31/12 12:54 AM      Component Value Range Comment   Sodium 138  135 - 145 mEq/L    Potassium 3.8  3.5 - 5.1 mEq/L    Chloride 101  96 - 112 mEq/L    CO2 27  19 - 32 mEq/L    Glucose, Bld 113 (*) 70 - 99 mg/dL    BUN 26 (*) 6 - 23 mg/dL    Creatinine, Ser 5.62  0.50 - 1.10 mg/dL    Calcium 13.0  8.4 - 10.5 mg/dL    GFR calc non Af Amer 57 (*) >90 mL/min    GFR calc Af Amer 66 (*) >90 mL/min   TROPONIN I     Status: Normal   Collection Time   01/31/12 12:54 AM      Component Value Range Comment   Troponin I <0.30  <0.30 ng/mL   TROPONIN I     Status: Normal   Collection Time   01/31/12  4:01 AM      Component Value Range Comment   Troponin I <0.30  <0.30 ng/mL   VITAMIN B12     Status:  Normal   Collection Time   01/31/12  5:50 AM      Component Value Range Comment   Vitamin B-12 554  211 - 911 pg/mL   FOLATE     Status: Normal   Collection Time   01/31/12  5:50 AM      Component Value Range Comment   Folate >20.0     IRON AND TIBC     Status: Abnormal   Collection Time   01/31/12  5:50 AM      Component Value Range Comment   Iron 53  42 - 135 ug/dL    TIBC 865  784 - 696 ug/dL    Saturation Ratios 16 (*) 20 - 55 %    UIBC 286  125 - 400 ug/dL   FERRITIN     Status: Normal   Collection Time   01/31/12  5:50 AM      Component Value Range Comment   Ferritin 120  10 - 291 ng/mL   RETICULOCYTES     Status: Abnormal   Collection Time   01/31/12  5:50 AM      Component Value Range Comment   Retic Ct Pct 2.5  0.4 - 3.1 %    RBC. 3.37 (*) 3.87 - 5.11 MIL/uL    Retic Count, Manual 84.3  19.0 - 186.0 K/uL   LIPID PANEL     Status: Abnormal   Collection Time   01/31/12  5:50 AM      Component Value Range Comment   Cholesterol 270 (*) 0 - 200 mg/dL    Triglycerides 295 (*) <150 mg/dL    HDL 56  >28 mg/dL    Total CHOL/HDL Ratio 4.8      VLDL 30  0 - 40 mg/dL    LDL Cholesterol 413 (*) 0 - 99 mg/dL   MAGNESIUM     Status: Normal   Collection Time   01/31/12  9:58 AM      Component Value Range Comment   Magnesium 1.8  1.5 - 2.5  mg/dL   URINALYSIS, ROUTINE W REFLEX MICROSCOPIC     Status: Abnormal   Collection Time   01/31/12 11:36 AM      Component Value Range Comment   Color, Urine YELLOW  YELLOW    APPearance CLEAR  CLEAR    Specific Gravity, Urine <1.005 (*) 1.005 - 1.030    pH 7.0  5.0 - 8.0    Glucose, UA NEGATIVE  NEGATIVE mg/dL    Hgb urine dipstick NEGATIVE  NEGATIVE    Bilirubin Urine NEGATIVE  NEGATIVE    Ketones, ur NEGATIVE  NEGATIVE mg/dL    Protein, ur NEGATIVE  NEGATIVE mg/dL    Urobilinogen, UA 0.2  0.0 - 1.0 mg/dL    Nitrite NEGATIVE  NEGATIVE    Leukocytes, UA NEGATIVE  NEGATIVE    Dg Chest Port 1 View  01/31/2012  *RADIOLOGY  REPORT*  Clinical Data: Hypertension, shortness of breath  PORTABLE CHEST - 1 VIEW  Comparison: 01/03/2012  Findings: Chronic interstitial markings.  No focal consolidation. No pleural effusion or pneumothorax.  Stable mild cardiomegaly.  IMPRESSION: No evidence of acute cardiopulmonary disease.   Original Report Authenticated By: Charline Bills, M.D.    Review of Systems: General: no anorexia, weight gain or weight loss Respiratory: no cough, sputum production or hemoptysis GI: no nausea, abdominal pain, emesis, diarrhea or constipation Integument: no significant lesions Neurologic: No muscle weakness or paralysis; no speech disturbance; no headache All other systems reviewed and are negative.  Physical Exam: Blood pressure 119/44, pulse 60, temperature 98.7 F (37.1 C), temperature source Oral, resp. rate 18, height 5' (1.524 m), weight 65.091 kg (143 lb 8 oz), SpO2 95.00%. General-Well-developed; no acute distress HEENT-[place]/AT; PERRL; EOM intact; conjunctiva and lids nl Neck-No JVD; no carotid bruits Endocrine-No thyromegaly Lungs-Clear lung fields; resonant percussion; normal I-to-E ratio Cardiovascular- normal PMI; normal S1 and S2 Abdomen-BS normal; soft and non-tender without masses or organomegaly Musculoskeletal-No deformities, cyanosis or clubbing Neurologic-Nl cranial nerves; symmetric strength and tone Skin- Warm, no significant lesions Extremities-Nl distal pulses; no edema  EKG:  Normal sinus rhythm; borderline low-voltage; minor nonspecific ST segment abnormality; otherwise unremarkable.  Assessment/Plan:  Chest pain: Clinical characteristics are highly consistent with myocardial ischemia, but negative EKG and cardiac markers decrease the likelihood of symptomatic coronary artery disease. Remote negative cardiac catheterization is not terribly helpful, but certainly does not markedly increase the likelihood of an acute coronary syndrome. If a third set of cardiac markers,  a d-dimer level, and repeat EKG remained normal, we will plan to proceed with a stress echocardiogram in the morning. Alternative diagnoses include pulmonary embolism, coronary artery spasm, esophageal spasm or other GI conditions that might cause chest discomfort along with GI blood loss.  Amlodipine would tend to diminish coronary or esophageal spasm.  Hypertension: Systolic blood pressure has been elevated in the past and early in this admission; however, since this morning BPs have been normal. We will continue to monitor and adjust medication if necessary.  Hyperlipidemia: Lipid profile is markedly suboptimal. Atorvastatin will be added to the patient's medical regime.  Anemia:  Etiology is uncertain. Patient denies use of nonsteroidal medications. Iron studies are not highly suggestive of iron deficiency. Stool for Hemoccult testing will be obtained. If positive, and GI consultation should be considered.  [place], MD 01/31/2012, 6:04 PM

## 2012-01-31 NOTE — H&P (Addendum)
[facility] History and Physical  CHARLE [name]  BMW:[id number]  DOB: 10-18-1940   DOA: 01/31/2012   PCP:   Mackenzie Perches, MD   Chief Complaint:  Chest pain for one hour last night  HPI: Mackenzie [name] is an 71 y.o. female.   Elderly Caucasian lady, with known nonobstructive coronary artery disease, hypertension, hyperlipidemia, awoken from sleep at about 11:30 PM with 8/10 central crushing chest pain, associated with drenching sweats, legs and nausea, and after about one hour without significant relief from over 700 mg of aspirin she eventually came to the emergency room for assistance.  In the emergency room she received intravenous Zofran without effect and then one tablet of sublingual NTG, which cause the pain to dramatically decrease to a 2/10. Because of the relief with nitroglycerin the patient was initially started on intravenous heparin and nitroglycerin, but as her EKG remained unremarkable and cardiac enzymes were returned negative, nitroglycerin and heparin were discontinued and the hospitalist service was called for assistance.  She denies previous similar episode, and admits to heartburn with [place] food, or margaritas.  Rewiew of Systems:   All systems negative except as marked bold or noted in the HPI;  Constitutional:  malaise, fever and chills. ;  Eyes: eye pain, redness and discharge. ;  ENMT: ear pain, hoarseness, nasal congestion, sinus pressure and sore throat. ;  Cardiovascular:  chest pain, palpitations, diaphoresis, dyspnea and peripheral edema. ;  Respiratory:  cough, hemoptysis, wheezing and stridor. ;  Gastrointestinal:  nausea, vomiting, diarrhea, constipation, abdominal pain, melena, blood in stool, hematemesis, jaundice and rectal bleeding. unusual weight loss..   Genitourinary: r frequency, dysuria, incontinence,flank pain and hematuria; Musculoskeletal:  back pain and neck pain.  swelling and trauma.;  Skin: .  pruritus, rash, abrasions, bruising and skin  lesion.; ulcerations Neuro:  headache, lightheadedness and neck stiffness.  weakness, altered level of consciousness , altered mental status, extremity weakness, burning feet, involuntary movement, seizure and syncope.  Psych:  anxiety, depression, insomnia, tearfulness, panic attacks, hallucinations, paranoia, suicidal or homicidal ideation    Past Medical History  Diagnosis Date  . Hypertension   . Hyperlipidemia   . Mitral valve disorders   . Coronary atherosclerosis of native coronary artery     Past Surgical History  Procedure Date  . Cholecystectomy   . Abdominal hysterectomy     Medications:  HOME MEDS: Prior to Admission medications   Medication Sig Start Date End Date Taking? Authorizing Provider  aspirin 81 MG tablet Take 81 mg by mouth daily.     Yes Historical Provider, MD  ALPRAZolam Prudy Feeler) 0.5 MG tablet Take 0.5 mg by mouth 2 (two) times daily.      Historical Provider, MD  amLODipine-olmesartan (AZOR) 5-40 MG per tablet Take 1 tablet by mouth daily.      Historical Provider, MD  chlorthalidone (HYGROTON) 25 MG tablet Take 25 mg by mouth daily.      Historical Provider, MD  citalopram (CELEXA) 10 MG tablet Take 10 mg by mouth as directed.      Historical Provider, MD  metoprolol succinate (TOPROL-XL) 25 MG 24 hr tablet Take 25 mg by mouth daily.      Historical Provider, MD     Allergies:  No Known Allergies  Social History:   reports that she quit smoking about 25 years ago. She has never used smokeless tobacco. She reports that she drinks alcohol. She reports that she does not use illicit drugs.  Family History: Family History  Problem Relation Age of Onset  . Heart attack Mother 65    heart problems  . Cancer      FH  . Diabetes      FH  . Hypertension      FH  . Cancer Mother     uterus  . Pancreatitis Sister     died  age 52  . Diabetes Son   . Diabetes Daughter      Physical Exam: Filed Vitals:   01/31/12 0400 01/31/12 0415 01/31/12  0445 01/31/12 0500  BP: 147/69 139/65 140/58 135/48  Pulse: 59 67 57 59  Temp:      TempSrc:      Resp: 16 18 15 17   Height:      Weight:      SpO2: 97% 95% 97% 96%   Blood pressure 135/48, pulse 59, temperature 98.3 F (36.8 C), temperature source Oral, resp. rate 17, height 5' (1.524 m), weight 64.411 kg (142 lb), SpO2 96.00%.  GEN:  Pleasant obese elderly lady lying in the stretcher in no acute distress; cooperative with exam PSYCH:  alert and oriented x4; seems a little anxious; affect is appropriate. HEENT: Mucous membranes pink and anicteric; PERRLA; EOM intact; no cervical lymphadenopathy nor thyromegaly or carotid bruit; no JVD; Breasts:: Not examined CHEST WALL: No tenderness CHEST: Normal respiration, clear to auscultation bilaterally HEART: Regular rate and rhythm; no murmurs rubs or gallops BACK: Mild kyphosis  no CVA tenderness ABDOMEN: Obese, soft non-tender; no masses, no organomegaly, normal abdominal bowel sounds;  Rectal Exam: Not done EXTREMITIES:  age-appropriate arthropathy of the hands and knees; no edema; no ulcerations. Genitalia: not examined PULSES: 2+ and symmetric SKIN: Normal hydration no rash or ulceration CNS: Cranial nerves 2-12 grossly intact no focal lateralizing neurologic deficit   Labs on Admission:  Basic Metabolic Panel:  Lab 01/31/12 9604  NA 138  K 3.8  CL 101  CO2 27  GLUCOSE 113*  BUN 26*  CREATININE 0.96  CALCIUM 10.0  MG --  PHOS --   Liver Function Tests: No results found for this basename: AST:5,ALT:5,ALKPHOS:5,BILITOT:5,PROT:5,ALBUMIN:5 in the last 168 hours No results found for this basename: LIPASE:5,AMYLASE:5 in the last 168 hours No results found for this basename: AMMONIA:5 in the last 168 hours CBC:  Lab 01/31/12 0054  WBC 7.0  NEUTROABS --  HGB 10.6*  HCT 32.0*  MCV 95.0  PLT 268   Cardiac Enzymes:  Lab 01/31/12 0401 01/31/12 0054  CKTOTAL -- --  CKMB -- --  CKMBINDEX -- --  TROPONINI <0.30 <0.30    BNP: No components found with this basename: POCBNP:5 D-dimer: No components found with this basename: D-DIMER:5 CBG: No results found for this basename: GLUCAP:5 in the last 168 hours  Radiological Exams on Admission: Dg Chest Port 1 View  01/31/2012  *RADIOLOGY REPORT*  Clinical Data: Hypertension, shortness of breath  PORTABLE CHEST - 1 VIEW  Comparison: 01/03/2012  Findings: Chronic interstitial markings.  No focal consolidation. No pleural effusion or pneumothorax.  Stable mild cardiomegaly.  IMPRESSION: No evidence of acute cardiopulmonary disease.   Original Report Authenticated By: Charline Bills, M.D.     EKG: Independently reviewed. Normal sinus rhythm; nonspecific ST segment changes unchanged from prior EKGs   Assessment/Plan Present on Admission:  Chest pain  . Unstable angina, versus GERD   anemia  . HYPERLIPIDEMIA . HYPERTENSION . CAD, NATIVE VESSEL . MITRAL VALVE PROLAPSE   PLAN: We'll bring this lady in on observation to rule out an  acute coronary event by cardiac enzymes; will request a cardiology  evaluation in the morning.  Will give a dose of intravenous Protonix and continue high-dose Protonix as empiric treatment for acid reflux  Will not give any further IV fluids but will hold her diuretics for now;  When necessary nitroglycerin; Check lipid panel  check in your panel in light of new lid diagnosed anemia  She will be followed by her primary care physician later today  Other plans as per orders.  Code Status: FULL CODE  Family Communication: Plans discussed with patient and her son who is present at the bedside Disposition Plan: Deferred to primary physician    Mackenzie [name] Nocturnist [facility] Pager [phone]   01/31/2012, 6:17 AM

## 2012-01-31 NOTE — ED Provider Notes (Signed)
History     CSN: [id number]  Arrival date & time 01/31/12  0036   First MD Initiated Contact with Patient 01/31/12 0100      Chief Complaint  Patient presents with  . Chest Pain    (Consider location/radiation/quality/duration/timing/severity/associated sxs/prior treatment) HPI Mackenzie [name] is a 70 y.o. female  With a h/o HTN, MVP, CAD, hyperlipidemia who presents to the Emergency Department complaining of chest discomfort that woke her from sleep at 2330. She states she was wrapping packages all evening and had some anterior chest discomfort which she attributed to wrapping. She took her ASA 81 mg at bedtime. Woke with diaphoresis and sharp chest pain that radiated to her left shoulder blade. Took two regular 325 mg aspirin. Experienced mild nausea, no vomiting.Discomfort while improved some, remains a 3-4/10. Denies dificulty speaking or swallowing, fever, chills, cough, shortness of breath, weakness.  PCP Dr. Ouida Sills Cardiology Dr. Daleen Squibb  Past Medical History  Diagnosis Date  . Hypertension   . Hyperlipidemia   . Mitral valve disorders   . Coronary atherosclerosis of native coronary artery     Past Surgical History  Procedure Date  . Cholecystectomy   . Abdominal hysterectomy     Family History  Problem Relation Age of Onset  . Other Mother     heart problems  . Cancer      FH  . Diabetes      FH  . Hypertension      FH    History  Substance Use Topics  . Smoking status: Former Games developer  . Smokeless tobacco: Never Used     Comment: quit 20 + yrs ago  . Alcohol Use: Yes     Comment: occasionally    OB History    Grav Para Term Preterm Abortions TAB SAB Ect Mult Living                  Review of Systems  Constitutional: Positive for diaphoresis. Negative for fever.       10 Systems reviewed and are negative for acute change except as noted in the HPI.  HENT: Negative for congestion.   Eyes: Negative for discharge and redness.  Respiratory: Negative for  cough and shortness of breath.   Cardiovascular: Positive for chest pain.  Gastrointestinal: Positive for nausea. Negative for vomiting and abdominal pain.  Musculoskeletal: Negative for back pain.  Skin: Negative for rash.  Neurological: Negative for syncope, numbness and headaches.  Psychiatric/Behavioral:       No behavior change.    Allergies  Review of patient's allergies indicates no known allergies.  Home Medications   Current Outpatient Rx  Name  Route  Sig  Dispense  Refill  . ASPIRIN 81 MG PO TABS   Oral   Take 81 mg by mouth daily.           Marland Kitchen ALPRAZOLAM 0.5 MG PO TABS   Oral   Take 0.5 mg by mouth 2 (two) times daily.           Marland Kitchen AMLODIPINE-OLMESARTAN 5-40 MG PO TABS   Oral   Take 1 tablet by mouth daily.           . CHLORTHALIDONE 25 MG PO TABS   Oral   Take 25 mg by mouth daily.           Marland Kitchen CITALOPRAM HYDROBROMIDE 10 MG PO TABS   Oral   Take 10 mg by mouth as directed.           Marland Kitchen  METOPROLOL SUCCINATE ER 25 MG PO TB24   Oral   Take 25 mg by mouth daily.             BP 139/65  Pulse 67  Temp 98.3 F (36.8 C) (Oral)  Resp 18  Ht 5' (1.524 m)  Wt 142 lb (64.411 kg)  BMI 27.73 kg/m2  SpO2 95%  Physical Exam  Nursing note and vitals reviewed. Constitutional: She is oriented to person, place, and time. She appears well-developed and well-nourished.       Awake, alert, nontoxic appearance.  HENT:  Head: Normocephalic and atraumatic.  Right Ear: External ear normal.  Left Ear: External ear normal.  Mouth/Throat: Oropharynx is clear and moist.  Eyes: Conjunctivae normal are normal. Pupils are equal, round, and reactive to light. Right eye exhibits no discharge. Left eye exhibits no discharge.  Neck: Neck supple. No JVD present.       Right carotid bruit  Cardiovascular: Normal heart sounds and intact distal pulses.   Pulmonary/Chest: Effort normal and breath sounds normal. She exhibits no tenderness.  Abdominal: Soft. Bowel sounds  are normal. There is no tenderness. There is no rebound.  Musculoskeletal: Normal range of motion. She exhibits no tenderness.       Baseline ROM, no obvious new focal weakness.  Neurological: She is alert and oriented to person, place, and time.       Mental status and motor strength appears baseline for patient and situation.  Skin: No rash noted.  Psychiatric: She has a normal mood and affect.    ED Course  Procedures (including critical care time)  Results for orders placed during the hospital encounter of 01/31/12  CBC      Component Value Range   WBC 7.0  4.0 - 10.5 K/uL   RBC 3.37 (*) 3.87 - 5.11 MIL/uL   Hemoglobin 10.6 (*) 12.0 - 15.0 g/dL   HCT 40.9 (*) 81.1 - 91.4 %   MCV 95.0  78.0 - 100.0 fL   MCH 31.5  26.0 - 34.0 pg   MCHC 33.1  30.0 - 36.0 g/dL   RDW 78.2  95.6 - 21.3 %   Platelets 268  150 - 400 K/uL  BASIC METABOLIC PANEL      Component Value Range   Sodium 138  135 - 145 mEq/L   Potassium 3.8  3.5 - 5.1 mEq/L   Chloride 101  96 - 112 mEq/L   CO2 27  19 - 32 mEq/L   Glucose, Bld 113 (*) 70 - 99 mg/dL   BUN 26 (*) 6 - 23 mg/dL   Creatinine, Ser 0.86  0.50 - 1.10 mg/dL   Calcium 57.8  8.4 - 46.9 mg/dL   GFR calc non Af Amer 57 (*) >90 mL/min   GFR calc Af Amer 66 (*) >90 mL/min  TROPONIN I      Component Value Range   Troponin I <0.30  <0.30 ng/mL  TROPONIN I      Component Value Range   Troponin I <0.30  <0.30 ng/mL   Dg Chest Port 1 View  01/31/2012  *RADIOLOGY REPORT*  Clinical Data: Hypertension, shortness of breath  PORTABLE CHEST - 1 VIEW  Comparison: 01/03/2012  Findings: Chronic interstitial markings.  No focal consolidation. No pleural effusion or pneumothorax.  Stable mild cardiomegaly.  IMPRESSION: No evidence of acute cardiopulmonary disease.   Original Report Authenticated By: Charline Bills, M.D.     Date: 01/31/2012   0043  Rate:79  Rhythm:  normal sinus rhythm  QRS Axis: normal  Intervals: normal  ST/T Wave abnormalities:  nonspecific ST changes  Conduction Disutrbances:none  Narrative Interpretation:   Old EKG Reviewed: unchanged c/w 09/02/10  #2  Date: 01/31/2012  0346  Rate: 64  Rhythm: normal sinus rhythm  QRS Axis: normal  Intervals: normal  ST/T Wave abnormalities: normal  Conduction Disutrbances: none  Narrative Interpretation: unremarkable    Review of nuclear study completed on 02/17/2011: EF 71% Impression  Exercise Capacity: Lexiscan with low level exercise.  BP Response: Hypertensive blood pressure response.  Clinical Symptoms: Neck tightness with infusion  ECG Impression: No significant ST segment change suggestive of ischemia.  Comparison with Prior Nuclear Study: Apical defect slightly more prominent compared to previous  Overall Impression: Normal stress nuclear study with a small fixed apical defect suggestive of soft tissue attenuation; no ischemia.      4:50 AM:  T/C to Dr. Orvan Falconer, case discussed, including:  HPI, pertinent PM/SHx, VS/PE, dx testing, ED course and treatment.  Agreeable to admission for observation.  Requests to write temporary orders, telemetry bed..Will D/C heparin and NTG drips MDM  Patient with chst pain radiating to left shoulder associated with diaphoresis and nausea. H/o CAD, stress test showing fixed lesion of native vessel and mild apical defect. EKGS unremarkable. Troponin x 2 negative. Had begun heparin and nitro with relief of chest pain with one SL NTG. No further chest pain. Consulted with hospitalist, Dr. Orvan Falconer. He will admit for observation. Cardiology to see in the AM.Dx testing d/w pt and family.  Questions answered.  Verb understanding, agreeable to admission.Pt stable in ED with no significant deterioration in condition.The patient appears reasonably stabilized for admission considering the current resources, flow, and capabilities available in the ED at this time, and I doubt any other [facility] requiring further screening and/or treatment in the ED  prior to admission.  MDM Reviewed: previous chart, nursing note and vitals Reviewed previous: labs, ECG and x-ray Interpretation: labs, ECG and x-ray  CRITICAL CARE Performed by: Annamarie Dawley. Total critical care time: 30 Critical care time was exclusive of separately billable procedures and treating other patients. Critical care was necessary to treat or prevent imminent or life-threatening deterioration. Critical care was time spent personally by me on the following activities: development of treatment plan with patient and/or surrogate as well as nursing, discussions with consultants, evaluation of patient's response to treatment, examination of patient, obtaining history from patient or surrogate, ordering and performing treatments and interventions, ordering and review of laboratory studies, ordering and review of radiographic studies, pulse oximetry and re-evaluation of patient's condition.          Nicoletta Dress. Colon Branch, MD 01/31/12 [phone]

## 2012-01-31 NOTE — Progress Notes (Signed)
UR Chart Review Completed  

## 2012-02-01 DIAGNOSIS — D649 Anemia, unspecified: Secondary | ICD-10-CM | POA: Diagnosis not present

## 2012-02-01 DIAGNOSIS — R072 Precordial pain: Secondary | ICD-10-CM | POA: Diagnosis not present

## 2012-02-01 DIAGNOSIS — I2510 Atherosclerotic heart disease of native coronary artery without angina pectoris: Secondary | ICD-10-CM | POA: Diagnosis not present

## 2012-02-01 DIAGNOSIS — R079 Chest pain, unspecified: Secondary | ICD-10-CM | POA: Diagnosis not present

## 2012-02-01 LAB — CBC
HCT: 31.4 % — ABNORMAL LOW (ref 36.0–46.0)
Hemoglobin: 10.3 g/dL — ABNORMAL LOW (ref 12.0–15.0)
MCH: 31.4 pg (ref 26.0–34.0)
MCV: 95.7 fL (ref 78.0–100.0)
Platelets: 259 10*3/uL (ref 150–400)
RBC: 3.28 MIL/uL — ABNORMAL LOW (ref 3.87–5.11)
WBC: 6.7 10*3/uL (ref 4.0–10.5)

## 2012-02-01 LAB — COMPREHENSIVE METABOLIC PANEL
AST: 16 U/L (ref 0–37)
BUN: 20 mg/dL (ref 6–23)
CO2: 29 mEq/L (ref 19–32)
Calcium: 9.6 mg/dL (ref 8.4–10.5)
Chloride: 103 mEq/L (ref 96–112)
Creatinine, Ser: 0.97 mg/dL (ref 0.50–1.10)
GFR calc Af Amer: 66 mL/min — ABNORMAL LOW (ref >90)
GFR calc non Af Amer: 57 mL/min — ABNORMAL LOW (ref >90)
Glucose, Bld: 101 mg/dL — ABNORMAL HIGH (ref 70–99)
Total Bilirubin: 0.3 mg/dL (ref 0.3–1.2)

## 2012-02-01 MED ORDER — NITROGLYCERIN 0.4 MG SL SUBL: 0.4000 mg | SUBLINGUAL_TABLET | SUBLINGUAL | Status: DC | PRN

## 2012-02-01 MED ORDER — ATORVASTATIN CALCIUM 40 MG PO TABS: 40.0000 mg | ORAL_TABLET | Freq: Every day | ORAL | Status: DC

## 2012-02-01 NOTE — Progress Notes (Signed)
*  PRELIMINARY RESULTS* Echocardiogram Echocardiogram Stress Test has been performed.  [place] 02/01/2012, 10:11 AM

## 2012-02-01 NOTE — Progress Notes (Signed)
Stress Lab Nurses Notes - Liseth Wann  ANYRA KAUFMAN 02/01/2012 Reason for doing test: Chest Pain Type of test: Stress Echo inpatient Rm 320 Nurse performing test: Parke Poisson, RN Nuclear Medicine Tech: Not Applicable Echo Tech: Karrie Doffing MD performing test: R. Dietrich Pates Family MD: Ouida Sills Test explained and consent signed: yes IV started: 20g jelco, No redness or edema and Saline lock from floor Symptoms: Fatigue & SOB Treatment/Intervention: None Reason test stopped: reached target HR After recovery IV was: No redness or edema Patient to return to Nuc. Med at :NA Patient discharged: Transported back to room 320 via wc Patient's Condition upon discharge was: stable Comments: During test peak BP 203/43 & HR 150.  Recovery BP 132/54 & HR 88.  Symptom resolved in recovery. Erskine Speed T

## 2012-02-01 NOTE — Progress Notes (Signed)
NAMEGLENNIS, BORGER                  ACCOUNT NO.:  [id number]  MEDICAL RECORD NO.:  [id number]  LOCATION:  A320                          FACILITY:  [facility]  PHYSICIAN:  Kingsley Callander. Ouida Sills, MD       DATE OF BIRTH:  26-Jun-1940  DATE OF PROCEDURE: DATE OF DISCHARGE:  02/01/2012                                PROGRESS NOTE   Katara has had no chest pain overnight.  She has been evaluated by Cardiology and is having a stress echo today.  PHYSICAL EXAMINATION:  VITAL SIGNS:  Stable. LUNGS:  Clear. HEART:  Regular with no murmurs. ABDOMEN:  Soft and nontender.  IMPRESSION/PLAN: 1. Congestive chest pain.  History of nonobstructive coronary artery     disease.  Three sets of troponins are normal at less than 0.30.     EKGs revealed no evidence of ischemia.  Stress echo today. 2. Hypertension.  Continue current treatment. 3. Hyperlipidemia.  Cholesterol was 270 with an LDL of 184 and HDL of     56, triglycerides are 152.  She has been off therapy because of     side effects.  Cardiology has restarted Lipitor which has     previously given her side effects.  She is euthyroid with a TSH of     2.19. 4. Anemia stable.  Hemoglobin is 10.3.  Iron is 53, ferritin is 120,     B12 is 554, folate is greater than 20.  She has had no symptoms of     bleeding.  Colonoscopy is up-to-date.     Kingsley Callander. Ouida Sills, MD     ROF/MEDQ  D:  02/01/2012  T:  02/01/2012  Job:  [id number]

## 2012-02-01 NOTE — Plan of Care (Signed)
Problem: Discharge Progression Outcomes Goal: Discharge plan in place and appropriate Outcome: Adequate for Discharge Pt will be d/c home  Goal: Pain controlled with appropriate interventions Outcome: Adequate for Discharge Nitroglycerin tabs ordered prn for chest pain Goal: Hemodynamically stable Outcome: Adequate for Discharge Follow up with Dr. Ouida Sills for out pt blood work.

## 2012-02-01 NOTE — Progress Notes (Signed)
Mackenzie [name], [name]                  ACCOUNT NO.:  [id number]  MEDICAL RECORD NO.:  [id number]  LOCATION:  A320                          FACILITY:  [facility]  PHYSICIAN:  Kingsley Callander. Ouida Sills, MD       DATE OF BIRTH:  January 06, 1941  DATE OF PROCEDURE: DATE OF DISCHARGE:  02/01/2012                                PROGRESS NOTE   [name] was admitted last night after awakening with substernal chest pressure radiating into her back.  She had associated diaphoresis.  She was evaluated in the emergency room where EKG revealed no evidence of acute ischemia and her initial troponin was normal.  PHYSICAL EXAMINATION:  VITAL SIGNS:  Normal. GENERAL:  She is alert and in no distress. LUNGS:  Clear. HEART:  Regular with no murmurs.  No chest wall tenderness. ABDOMEN:  Soft and nontender with no palpable organomegaly. EXTREMITIES:  Reveal no calf swelling or edema.  IMPRESSION/PLAN: 1. Chest pain.  Initial evaluation reveals no evidence of acute     infarct.  Consult Cardiology. 2. Anemia stable. 3. Hypertension. 4. Hyperlipidemia.     Kingsley Callander. Ouida Sills, MD     ROF/MEDQ  D:  02/01/2012  T:  02/01/2012  Job:  [id number]

## 2012-02-01 NOTE — Progress Notes (Signed)
Mackenzie [name]  71 y.o.  female  Subjective: No problems overnight.  No recurrent chest discomfort since hospital admission.  Allergy: Review of patient's allergies indicates no known allergies.  Objective: Vital signs in last 24 hours: Temp:  [97.9 F (36.6 C)-98.7 F (37.1 C)] 98.1 F (36.7 C) (12/18 0559) Pulse Rate:  [57-66] 57  (12/18 0559) Resp:  [18] 18  (12/18 0559) BP: (119-139)/(42-68) 139/48 mmHg (12/18 0559) SpO2:  [95 %-98 %] 98 % (12/18 0559) Weight:  [66.134 kg (145 lb 12.8 oz)] 66.134 kg (145 lb 12.8 oz) (12/18 0559)  66.134 kg (145 lb 12.8 oz) Body mass index is 28.47 kg/(m^2).  Weight change: 1.724 kg (3 lb 12.8 oz) Last BM Date: 01/30/12  Intake/Output from previous day: 12/17 0701 - 12/18 0700 In: 120 [P.O.:120] Out: 1000 [Urine:1000]  General- Well developed; no acute distress; overweight Neck- No JVD, right carotid bruit Lungs- clear lung fields; normal I:E ratio Cardiovascular- normal PMI; normal S1 and S2; fourth heart sound present Abdomen- normal bowel sounds; soft and non-tender without masses or organomegaly Skin- Warm, no significant lesions  Lab Results: Cardiac Markers:    Basename 01/31/12 1829 01/31/12 0401  TROPONINI <0.30 <0.30   CBC:   Basename 02/01/12 0432 01/31/12 0054  WBC 6.7 7.0  HGB 10.3* 10.6*  HCT 31.4* 32.0*  PLT 259 268   BMET:  Basename 02/01/12 0432 01/31/12 0054  NA 140 138  K 4.3 3.8  CL 103 101  CO2 29 27  GLUCOSE 101* 113*  BUN 20 26*  CREATININE 0.97 0.96  CALCIUM 9.6 10.0   Hepatic Function:   Basename 02/01/12 0432  PROT 6.7  ALBUMIN 3.5  AST 16  ALT 11  ALKPHOS 65  BILITOT 0.3  BILIDIR --  IBILI --   GFR:  Estimated Creatinine Clearance: 43.8 ml/min (by C-G formula based on Cr of 0.97). Lipids:   Basename 01/31/12 0550  CHOL 270*  TRIG 152*  HDL 56   Imaging Studies/Results: Dg Chest Port 1 View 01/31/2012  *IMPRESSION: No evidence of acute cardiopulmonary disease.     Stress Echo:  achieve workload of 7 mets and an adequate heart rate.  No chest pain.  Stress EKG-normal.   Imaging: normal at rest; only a slight increase in contractility post-exercise.  Equivocal but low risk study.  Medications:  I have reviewed the patient's current medications. Scheduled:   . ALPRAZolam  0.5 mg Oral BID  . amLODipine  5 mg Oral Daily  . aspirin EC  81 mg Oral Daily  . atorvastatin  40 mg Oral q1800  . citalopram  10 mg Oral Daily  . enoxaparin (LOVENOX) injection  40 mg Subcutaneous Q24H  . irbesartan  300 mg Oral Daily  . metoprolol tartrate  12.5 mg Oral BID  . pantoprazole (PROTONIX) IV  40 mg Intravenous Once   Followed by  . pantoprazole  40 mg Oral BID AC  . sodium chloride  3 mL Intravenous Q12H    Assessment/Plan: In the setting of negative cardiac studies and a low risk stress echocardiogram, no further testing is necessary at present. Patient is to be discharged today. I will provide her with nitroglycerin to use in case of recurrent symptoms, but he she is to call if that happens.  Dr. Ouida Sills will address anemia. She will be provided with stool Hemoccult cards to return to his office. Iron studies are pending.  LOS: 1 day   [name] 02/01/2012, 10:13 AM

## 2012-02-13 DIAGNOSIS — R079 Chest pain, unspecified: Secondary | ICD-10-CM | POA: Diagnosis not present

## 2012-02-13 DIAGNOSIS — D649 Anemia, unspecified: Secondary | ICD-10-CM | POA: Diagnosis not present

## 2012-02-19 NOTE — Discharge Summary (Signed)
NAMEKADI, Mackenzie [name]                  ACCOUNT NO.:  [id number]  MEDICAL RECORD NO.:  [id number]  LOCATION:  A320                          FACILITY:  [facility]  PHYSICIAN:  Kingsley Callander. Ouida Sills, MD       DATE OF BIRTH:  15-Nov-1940  DATE OF ADMISSION:  01/31/2012 DATE OF DISCHARGE:  12/18/2013LH                              DISCHARGE SUMMARY   DISCHARGE DIAGNOSES: 1. Chest pain, myocardial infarction ruled out. 2. Normocytic anemia. 3. Hyperlipidemia. 4. Hypertension. 5. Mitral valve prolapse. 6. Colon polyps.  PROCEDURES:  Stress echocardiogram.  HOSPITAL COURSE:  This patient is a 72 year old female, who presented to the emergency room with pressure in the chest.  Her initial evaluation revealed no evidence of ischemia.  She was hospitalized for observation and had negative serial cardiac enzymes.  She was seen in Cardiology consultation by Dr. Dietrich Pates.  She underwent a stress echo which revealed no evidence of ischemia.  She had previously undergone a cardiac catheterization, in 2000 and has had no significant disease.  She has risk factors of heart disease and hyperlipidemia.  She has had great difficulty tolerating statin therapy.  Her cholesterol now is 270 with an LDL of 184, HDL 56, and triglycerides of 152.  Her glucose was mildly elevated on admission at 113.  Hemoglobin A1c was 5.8.  She was euthyroid with a TSH of 2.19.  She had a normocytic anemia with a hemoglobin of 10.6 with an MCV of 95. Iron was 53, TIBC was 339, ferritin was 120.  Folate was greater than 20 and B12 was 554.  Her colonoscopy is up-to-date.  She has had no symptoms of blood loss.  She is recovering from a recent viral illness.  She had a normal D-dimer of 0.38.  The patient's symptoms resolved.  There were no arrhythmias on cardiac monitoring.  She had no further pressures, therefore felt to be stable for discharge.  She will have follow up in my office in 1 week.  DISCHARGE MEDICATIONS:  Azor 5/40 mg  daily, alprazolam 0.5 mg b.i.d. p.r.n., aspirin 81 mg daily, chlorthalidone 25 mg daily, citalopram 10 mg daily, Toprol-XL 25 mg daily, multivitamin daily, and sublingual nitroglycerin p.r.n.  CONDITION AT DISCHARGE:  Stable and improved.     Kingsley Callander. Ouida Sills, MD     ROF/MEDQ  D:  02/19/2012  T:  02/19/2012  Job:  [id number]

## 2012-03-01 ENCOUNTER — Other Ambulatory Visit (INDEPENDENT_AMBULATORY_CARE_PROVIDER_SITE_OTHER): Payer: Medicare Other | Admitting: *Deleted

## 2012-03-01 ENCOUNTER — Ambulatory Visit (INDEPENDENT_AMBULATORY_CARE_PROVIDER_SITE_OTHER): Payer: Medicare Other | Admitting: *Deleted

## 2012-03-01 DIAGNOSIS — D649 Anemia, unspecified: Secondary | ICD-10-CM | POA: Diagnosis not present

## 2012-03-01 DIAGNOSIS — R0989 Other specified symptoms and signs involving the circulatory and respiratory systems: Secondary | ICD-10-CM

## 2012-03-01 DIAGNOSIS — Z7901 Long term (current) use of anticoagulants: Secondary | ICD-10-CM

## 2012-03-01 LAB — POC HEMOCCULT BLD/STL (HOME/3-CARD/SCREEN)
Card #2 Fecal Occult Blod, POC: NEGATIVE
Card #3 Fecal Occult Blood, POC: NEGATIVE

## 2012-03-22 ENCOUNTER — Other Ambulatory Visit (HOSPITAL_COMMUNITY): Payer: Self-pay | Admitting: Internal Medicine

## 2012-03-22 DIAGNOSIS — Z139 Encounter for screening, unspecified: Secondary | ICD-10-CM

## 2012-03-26 ENCOUNTER — Encounter (INDEPENDENT_AMBULATORY_CARE_PROVIDER_SITE_OTHER): Payer: Medicare Other

## 2012-03-26 DIAGNOSIS — I6529 Occlusion and stenosis of unspecified carotid artery: Secondary | ICD-10-CM | POA: Diagnosis not present

## 2012-03-27 ENCOUNTER — Ambulatory Visit (HOSPITAL_COMMUNITY)
Admission: RE | Admit: 2012-03-27 | Discharge: 2012-03-27 | Disposition: A | Discharge status: Home / Self Care | Payer: Medicare Other | Source: Ambulatory Visit | Attending: Internal Medicine | Admitting: Internal Medicine

## 2012-03-27 DIAGNOSIS — Z1231 Encounter for screening mammogram for malignant neoplasm of breast: Secondary | ICD-10-CM | POA: Diagnosis not present

## 2012-03-27 DIAGNOSIS — Z139 Encounter for screening, unspecified: Secondary | ICD-10-CM

## 2012-05-08 DIAGNOSIS — D649 Anemia, unspecified: Secondary | ICD-10-CM | POA: Diagnosis not present

## 2012-05-08 DIAGNOSIS — Z79899 Other long term (current) drug therapy: Secondary | ICD-10-CM | POA: Diagnosis not present

## 2012-05-15 DIAGNOSIS — D649 Anemia, unspecified: Secondary | ICD-10-CM | POA: Diagnosis not present

## 2012-05-15 DIAGNOSIS — I10 Essential (primary) hypertension: Secondary | ICD-10-CM | POA: Diagnosis not present

## 2012-06-18 ENCOUNTER — Inpatient Hospital Stay (HOSPITAL_COMMUNITY): Payer: Medicare Other

## 2012-06-18 ENCOUNTER — Emergency Department (HOSPITAL_COMMUNITY): Payer: Medicare Other

## 2012-06-18 ENCOUNTER — Inpatient Hospital Stay (HOSPITAL_COMMUNITY)
Admission: EM | Admit: 2012-06-18 | Discharge: 2012-06-20 | DRG: 184 | Disposition: A | Discharge status: Home Health | Payer: Medicare Other | Attending: General Surgery | Admitting: General Surgery

## 2012-06-18 ENCOUNTER — Encounter (HOSPITAL_COMMUNITY): Payer: Self-pay | Admitting: *Deleted

## 2012-06-18 DIAGNOSIS — I200 Unstable angina: Secondary | ICD-10-CM | POA: Diagnosis present

## 2012-06-18 DIAGNOSIS — I2510 Atherosclerotic heart disease of native coronary artery without angina pectoris: Secondary | ICD-10-CM | POA: Diagnosis present

## 2012-06-18 DIAGNOSIS — D649 Anemia, unspecified: Secondary | ICD-10-CM | POA: Diagnosis present

## 2012-06-18 DIAGNOSIS — Z888 Allergy status to other drugs, medicaments and biological substances status: Secondary | ICD-10-CM | POA: Diagnosis not present

## 2012-06-18 DIAGNOSIS — S2241XA Multiple fractures of ribs, right side, initial encounter for closed fracture: Secondary | ICD-10-CM

## 2012-06-18 DIAGNOSIS — Z7982 Long term (current) use of aspirin: Secondary | ICD-10-CM | POA: Diagnosis not present

## 2012-06-18 DIAGNOSIS — S2231XA Fracture of one rib, right side, initial encounter for closed fracture: Secondary | ICD-10-CM

## 2012-06-18 DIAGNOSIS — Z79899 Other long term (current) drug therapy: Secondary | ICD-10-CM | POA: Diagnosis not present

## 2012-06-18 DIAGNOSIS — I10 Essential (primary) hypertension: Secondary | ICD-10-CM | POA: Diagnosis not present

## 2012-06-18 DIAGNOSIS — I679 Cerebrovascular disease, unspecified: Secondary | ICD-10-CM | POA: Diagnosis present

## 2012-06-18 DIAGNOSIS — Z87891 Personal history of nicotine dependence: Secondary | ICD-10-CM | POA: Diagnosis not present

## 2012-06-18 DIAGNOSIS — E785 Hyperlipidemia, unspecified: Secondary | ICD-10-CM | POA: Diagnosis present

## 2012-06-18 DIAGNOSIS — J939 Pneumothorax, unspecified: Secondary | ICD-10-CM | POA: Diagnosis present

## 2012-06-18 DIAGNOSIS — S2249XA Multiple fractures of ribs, unspecified side, initial encounter for closed fracture: Secondary | ICD-10-CM | POA: Diagnosis not present

## 2012-06-18 DIAGNOSIS — S298XXA Other specified injuries of thorax, initial encounter: Secondary | ICD-10-CM | POA: Diagnosis not present

## 2012-06-18 DIAGNOSIS — S2239XA Fracture of one rib, unspecified side, initial encounter for closed fracture: Secondary | ICD-10-CM | POA: Diagnosis not present

## 2012-06-18 DIAGNOSIS — S7000XA Contusion of unspecified hip, initial encounter: Secondary | ICD-10-CM | POA: Diagnosis not present

## 2012-06-18 DIAGNOSIS — Y92009 Unspecified place in unspecified non-institutional (private) residence as the place of occurrence of the external cause: Secondary | ICD-10-CM

## 2012-06-18 DIAGNOSIS — Z8249 Family history of ischemic heart disease and other diseases of the circulatory system: Secondary | ICD-10-CM

## 2012-06-18 DIAGNOSIS — W010XXA Fall on same level from slipping, tripping and stumbling without subsequent striking against object, initial encounter: Secondary | ICD-10-CM | POA: Diagnosis present

## 2012-06-18 DIAGNOSIS — Z8049 Family history of malignant neoplasm of other genital organs: Secondary | ICD-10-CM

## 2012-06-18 DIAGNOSIS — Z833 Family history of diabetes mellitus: Secondary | ICD-10-CM

## 2012-06-18 DIAGNOSIS — S270XXA Traumatic pneumothorax, initial encounter: Secondary | ICD-10-CM | POA: Diagnosis present

## 2012-06-18 DIAGNOSIS — Y93H2 Activity, gardening and landscaping: Secondary | ICD-10-CM

## 2012-06-18 DIAGNOSIS — R079 Chest pain, unspecified: Secondary | ICD-10-CM | POA: Diagnosis not present

## 2012-06-18 LAB — CBC WITH DIFFERENTIAL/PLATELET
Eosinophils Relative: 2 % (ref 0–5)
HCT: 36.8 % (ref 36.0–46.0)
Hemoglobin: 12.4 g/dL (ref 12.0–15.0)
Lymphocytes Relative: 19 % (ref 12–46)
Lymphs Abs: 2 10*3/uL (ref 0.7–4.0)
MCV: 93.2 fL (ref 78.0–100.0)
Monocytes Absolute: 0.6 10*3/uL (ref 0.1–1.0)
Monocytes Relative: 6 % (ref 3–12)
RBC: 3.95 MIL/uL (ref 3.87–5.11)
WBC: 10.8 10*3/uL — ABNORMAL HIGH (ref 4.0–10.5)

## 2012-06-18 LAB — COMPREHENSIVE METABOLIC PANEL
ALT: 22 U/L (ref 0–35)
CO2: 26 mEq/L (ref 19–32)
Calcium: 10.4 mg/dL (ref 8.4–10.5)
Creatinine, Ser: 1.09 mg/dL (ref 0.50–1.10)
GFR calc Af Amer: 57 mL/min — ABNORMAL LOW (ref >90)
GFR calc non Af Amer: 49 mL/min — ABNORMAL LOW (ref >90)
Glucose, Bld: 117 mg/dL — ABNORMAL HIGH (ref 70–99)
Sodium: 134 mEq/L — ABNORMAL LOW (ref 135–145)

## 2012-06-18 LAB — CREATININE, SERUM
Creatinine, Ser: 1.15 mg/dL — ABNORMAL HIGH (ref 0.50–1.10)
GFR calc non Af Amer: 46 mL/min — ABNORMAL LOW (ref >90)

## 2012-06-18 LAB — URINALYSIS, ROUTINE W REFLEX MICROSCOPIC
Bilirubin Urine: NEGATIVE
Hgb urine dipstick: NEGATIVE
Protein, ur: NEGATIVE mg/dL
Urobilinogen, UA: 0.2 mg/dL (ref 0.0–1.0)

## 2012-06-18 LAB — MRSA PCR SCREENING: MRSA by PCR: NEGATIVE

## 2012-06-18 LAB — URINE MICROSCOPIC-ADD ON

## 2012-06-18 MED ORDER — IOHEXOL 300 MG/ML  SOLN
80.0000 mL | Freq: Once | INTRAMUSCULAR | Status: AC | PRN
  Administered 2012-06-18: 80 mL via INTRAVENOUS

## 2012-06-18 MED ORDER — HYDROCODONE-ACETAMINOPHEN 5-325 MG PO TABS
1.0000 | ORAL_TABLET | Freq: Once | ORAL | Status: AC
  Administered 2012-06-18: 1 via ORAL
  Filled 2012-06-18: qty 1

## 2012-06-18 MED ORDER — ALPRAZOLAM 0.25 MG PO TABS: 0.5000 mg | ORAL_TABLET | Freq: Two times a day (BID) | ORAL | Status: DC | PRN

## 2012-06-18 MED ORDER — ASPIRIN EC 81 MG PO TBEC
81.0000 mg | DELAYED_RELEASE_TABLET | Freq: Every day | ORAL | Status: DC
  Administered 2012-06-18 – 2012-06-20 (×3): 81 mg via ORAL
  Filled 2012-06-18 (×3): qty 1

## 2012-06-18 MED ORDER — METOPROLOL SUCCINATE ER 25 MG PO TB24
25.0000 mg | ORAL_TABLET | Freq: Every day | ORAL | Status: DC
  Administered 2012-06-18 – 2012-06-19 (×2): 25 mg via ORAL
  Filled 2012-06-18 (×3): qty 1

## 2012-06-18 MED ORDER — IRBESARTAN 300 MG PO TABS
300.0000 mg | ORAL_TABLET | Freq: Every day | ORAL | Status: DC
  Administered 2012-06-18 – 2012-06-20 (×3): 300 mg via ORAL
  Filled 2012-06-18 (×3): qty 1

## 2012-06-18 MED ORDER — SODIUM CHLORIDE 0.9 % IJ SOLN
3.0000 mL | Freq: Two times a day (BID) | INTRAMUSCULAR | Status: DC
  Administered 2012-06-18: 3 mL via INTRAVENOUS

## 2012-06-18 MED ORDER — SODIUM CHLORIDE 0.9 % IJ SOLN: 3.0000 mL | INTRAMUSCULAR | Status: DC | PRN

## 2012-06-18 MED ORDER — MORPHINE SULFATE 2 MG/ML IJ SOLN
1.0000 mg | INTRAMUSCULAR | Status: DC | PRN
  Administered 2012-06-19 (×4): 1 mg via INTRAVENOUS
  Filled 2012-06-18 (×4): qty 1

## 2012-06-18 MED ORDER — CITALOPRAM HYDROBROMIDE 10 MG PO TABS
10.0000 mg | ORAL_TABLET | Freq: Every day | ORAL | Status: DC
  Administered 2012-06-18 – 2012-06-20 (×3): 10 mg via ORAL
  Filled 2012-06-18 (×3): qty 1

## 2012-06-18 MED ORDER — HYDROMORPHONE HCL PF 1 MG/ML IJ SOLN
1.0000 mg | Freq: Once | INTRAMUSCULAR | Status: AC
  Administered 2012-06-18: 1 mg via INTRAVENOUS
  Filled 2012-06-18: qty 1

## 2012-06-18 MED ORDER — ATORVASTATIN CALCIUM 20 MG PO TABS
20.0000 mg | ORAL_TABLET | Freq: Every day | ORAL | Status: DC
  Administered 2012-06-18: 20 mg via ORAL
  Filled 2012-06-18 (×2): qty 1

## 2012-06-18 MED ORDER — OXYCODONE HCL 5 MG PO TABS
5.0000 mg | ORAL_TABLET | ORAL | Status: DC | PRN
  Administered 2012-06-18 – 2012-06-20 (×4): 5 mg via ORAL
  Filled 2012-06-18 (×4): qty 1

## 2012-06-18 MED ORDER — AMLODIPINE BESYLATE 5 MG PO TABS
5.0000 mg | ORAL_TABLET | Freq: Every day | ORAL | Status: DC
  Administered 2012-06-18 – 2012-06-19 (×2): 5 mg via ORAL
  Filled 2012-06-18 (×3): qty 1

## 2012-06-18 MED ORDER — SIMVASTATIN 40 MG PO TABS: 40.0000 mg | ORAL_TABLET | Freq: Every day | ORAL | Status: DC

## 2012-06-18 MED ORDER — ONDANSETRON HCL 4 MG/2ML IJ SOLN
4.0000 mg | Freq: Once | INTRAMUSCULAR | Status: AC
  Administered 2012-06-18: 4 mg via INTRAVENOUS
  Filled 2012-06-18: qty 2

## 2012-06-18 MED ORDER — SODIUM CHLORIDE 0.9 % IV SOLN: 250.0000 mL | INTRAVENOUS | Status: DC | PRN

## 2012-06-18 MED ORDER — ONDANSETRON HCL 4 MG/2ML IJ SOLN
4.0000 mg | Freq: Four times a day (QID) | INTRAMUSCULAR | Status: DC | PRN
  Administered 2012-06-18: 4 mg via INTRAVENOUS
  Filled 2012-06-18: qty 2

## 2012-06-18 MED ORDER — SODIUM CHLORIDE 0.9 % IV SOLN
Freq: Once | INTRAVENOUS | Status: AC
  Administered 2012-06-18: 13:00:00 via INTRAVENOUS

## 2012-06-18 MED ORDER — AMLODIPINE-OLMESARTAN 5-40 MG PO TABS: 1.0000 | ORAL_TABLET | Freq: Every day | ORAL | Status: DC

## 2012-06-18 MED ORDER — ACETAMINOPHEN 325 MG PO TABS
650.0000 mg | ORAL_TABLET | ORAL | Status: DC | PRN
  Filled 2012-06-18: qty 2

## 2012-06-18 MED ORDER — ONDANSETRON HCL 4 MG PO TABS: 4.0000 mg | ORAL_TABLET | Freq: Four times a day (QID) | ORAL | Status: DC | PRN

## 2012-06-18 MED ORDER — ENOXAPARIN SODIUM 40 MG/0.4ML ~~LOC~~ SOLN
40.0000 mg | SUBCUTANEOUS | Status: DC
  Administered 2012-06-18 – 2012-06-19 (×2): 40 mg via SUBCUTANEOUS
  Filled 2012-06-18 (×3): qty 0.4

## 2012-06-18 MED ORDER — CHLORTHALIDONE 25 MG PO TABS
25.0000 mg | ORAL_TABLET | Freq: Every day | ORAL | Status: DC
  Administered 2012-06-18 – 2012-06-20 (×3): 25 mg via ORAL
  Filled 2012-06-18 (×3): qty 1

## 2012-06-18 MED ORDER — ADULT MULTIVITAMIN W/MINERALS CH
1.0000 | ORAL_TABLET | Freq: Every day | ORAL | Status: DC
  Administered 2012-06-18 – 2012-06-20 (×3): 1 via ORAL
  Filled 2012-06-18 (×3): qty 1

## 2012-06-18 MED ORDER — BISACODYL 10 MG RE SUPP: 10.0000 mg | Freq: Every day | RECTAL | Status: DC | PRN

## 2012-06-18 NOTE — ED Notes (Signed)
Per [facility]-Pt fell while doing yard work, has right 8th and 9th rib fx and small apical pneumo. HR-55 O2-96% on 2L BP-113/45

## 2012-06-18 NOTE — ED Notes (Signed)
Trauma MD at bedside.

## 2012-06-18 NOTE — ED Notes (Signed)
Abrasions noted to rt lateral chest.

## 2012-06-18 NOTE — ED Notes (Signed)
Pt requesting something for pain. edp aware and vo received

## 2012-06-18 NOTE — H&P (Signed)
Mackenzie [name] is an 72 y.o. female.   Chief Complaint: right rib pain HPI: Patient was working in her backyard pulling out dead trees and gathering branches.  She fell and struck her right chest on a stump.  She denies LOC but had immediate sharp pain.  The pain is in her right chest and exacerbated by deep inspiration. She called her neighbor who helped her get up. She continued to have pain & her neighbor brought her to [facility] emergency room. She was evaluated there by Dr. Preston Fleeting. She was found to have right-sided rib fractures 8-9 With a small apical pneumothorax on the right side. No significant hemothorax was seen. CT scan of the chest was planned but the scanner was not functional at [facility]. I accepted the patient in transfer to the trauma service at [facility]. She came to the emergency department so we could complete her workup. She continues to complain of localized pain. No significant shortness of breath. Her son is known to me as he works  In patient placement.  Past Medical History  Diagnosis Date  . Hypertension   . Hyperlipidemia   . Mitral valve prolapse     Mitral valve prolapse on echocardiogram in 2004 along with RVH and LVH  . Arteriosclerotic cardiovascular disease (ASCVD)     Cardiac catheterization in 2000: Report is unavailable-chart notes indicate insignificant coronary artery disease  . Colonic polyp     X3 in 2011 along with an ulceration of the cecum  . Anemia, normocytic normochromic 01/2012    H&H-10.6/32; low normal iron, normal ferritin, saturation-16%   Primary care physician is Dr. Ouida Sills in [place]   Past Surgical History  Procedure Laterality Date  . Cholecystectomy    . Abdominal hysterectomy    . Colonoscopy  2005    Negative screening study  . Colonoscopy w/ polypectomy  2011    Cecal ulcer; tubular adenoma x3    Family History  Problem Relation Age of Onset  . Heart attack Mother 83    heart problems  . Cancer      FH  . Diabetes     FH  . Hypertension      FH  . Cancer Mother     uterus  . Pancreatitis Sister     died  age 75  . Diabetes Son   . Diabetes Daughter    Social History:  reports that she quit smoking about 25 years ago. She has never used smokeless tobacco. She reports that  drinks alcohol. She reports that she does not use illicit drugs.  Allergies:  Allergies  Allergen Reactions  . Statins Other (See Comments)    Atorvastatin, pravastatin, simvastatin->myalgias      (Not in a hospital admission)  Results for orders placed during the hospital encounter of 06/18/12 (from the past 48 hour(s))  CBC WITH DIFFERENTIAL     Status: Abnormal   Collection Time    06/18/12 12:52 PM      Result Value Range   WBC 10.8 (*) 4.0 - 10.5 K/uL   RBC 3.95  3.87 - 5.11 MIL/uL   Hemoglobin 12.4  12.0 - 15.0 g/dL   HCT 57.8  46.9 - 62.9 %   MCV 93.2  78.0 - 100.0 fL   MCH 31.4  26.0 - 34.0 pg   MCHC 33.7  30.0 - 36.0 g/dL   RDW 52.8  41.3 - 24.4 %   Platelets 374  150 - 400 K/uL  Neutrophils Relative 73  43 - 77 %   Neutro Abs 7.9 (*) 1.7 - 7.7 K/uL   Lymphocytes Relative 19  12 - 46 %   Lymphs Abs 2.0  0.7 - 4.0 K/uL   Monocytes Relative 6  3 - 12 %   Monocytes Absolute 0.6  0.1 - 1.0 K/uL   Eosinophils Relative 2  0 - 5 %   Eosinophils Absolute 0.3  0.0 - 0.7 K/uL   Basophils Relative 0  0 - 1 %   Basophils Absolute 0.0  0.0 - 0.1 K/uL  COMPREHENSIVE METABOLIC PANEL     Status: Abnormal   Collection Time    06/18/12 12:52 PM      Result Value Range   Sodium 134 (*) 135 - 145 mEq/L   Potassium 4.8  3.5 - 5.1 mEq/L   Chloride 95 (*) 96 - 112 mEq/L   CO2 26  19 - 32 mEq/L   Glucose, Bld 117 (*) 70 - 99 mg/dL   BUN 38 (*) 6 - 23 mg/dL   Creatinine, Ser 0.86  0.50 - 1.10 mg/dL   Calcium 57.8  8.4 - 46.9 mg/dL   Total Protein 8.4 (*) 6.0 - 8.3 g/dL   Albumin 4.6  3.5 - 5.2 g/dL   AST 30  0 - 37 U/L   ALT 22  0 - 35 U/L   Alkaline Phosphatase 91  39 - 117 U/L   Total Bilirubin 0.3  0.3 - 1.2  mg/dL   GFR calc non Af Amer 49 (*) >90 mL/min   GFR calc Af Amer 57 (*) >90 mL/min   Comment:            The eGFR has been calculated     using the CKD EPI equation.     This calculation has not been     validated in all clinical     situations.     eGFR's persistently     <90 mL/min signify     possible Chronic Kidney Disease.  LACTIC ACID, PLASMA     Status: None   Collection Time    06/18/12 12:53 PM      Result Value Range   Lactic Acid, Venous 1.3  0.5 - 2.2 mmol/L  URINALYSIS, ROUTINE W REFLEX MICROSCOPIC     Status: Abnormal   Collection Time    06/18/12  1:26 PM      Result Value Range   Color, Urine YELLOW  YELLOW   APPearance CLEAR  CLEAR   Specific Gravity, Urine 1.025  1.005 - 1.030   pH 6.0  5.0 - 8.0   Glucose, UA NEGATIVE  NEGATIVE mg/dL   Hgb urine dipstick NEGATIVE  NEGATIVE   Bilirubin Urine NEGATIVE  NEGATIVE   Ketones, ur TRACE (*) NEGATIVE mg/dL   Protein, ur NEGATIVE  NEGATIVE mg/dL   Urobilinogen, UA 0.2  0.0 - 1.0 mg/dL   Nitrite NEGATIVE  NEGATIVE   Leukocytes, UA SMALL (*) NEGATIVE  URINE MICROSCOPIC-ADD ON     Status: Abnormal   Collection Time    06/18/12  1:26 PM      Result Value Range   Squamous Epithelial / LPF FEW (*) RARE   WBC, UA 0-2  <3 WBC/hpf   RBC / HPF 3-6  <3 RBC/hpf   Bacteria, UA FEW (*) RARE   Dg Ribs Unilateral W/chest Right  06/18/2012  *RADIOLOGY REPORT*  Clinical Data: Limb fell and struck the patient in back, pain and  swelling over right scapula  RIGHT RIBS AND CHEST - 3+ VIEW  Comparison: Chest radiograph 01/31/2012  Findings: Normal heart size, mediastinal contours, and pulmonary vascularity. Calcified granuloma mid right lung again identified. Small right apex pneumothorax. No pulmonary infiltrate or definite pleural effusion. Bones appear mildly demineralized. Atherosclerotic calcification noted within thoracic aorta. Mildly displaced fractures of the right eighth and ninth ribs noted. No additional focal bony  abnormalities.  IMPRESSION: Fractures of the posterior right eighth and ninth ribs. Small right apex pneumothorax.  Findings called to Dr. Preston Fleeting on 06/18/2012 at 1240 hours.   Original Report Authenticated By: Ulyses Southward, M.D.     Review of Systems  Constitutional: Negative.   HENT: Negative.   Eyes: Negative.   Respiratory:       Pain right ribs exacerbated by deep inspiration, no cough  Cardiovascular:       Localized rib chest wall pain, no central chest pain  Gastrointestinal:       No acute abdominal pain, chronic pelvic abdominal pain  Genitourinary: Negative.   Musculoskeletal: Negative.   Skin:       Abrasion right chest wall  Neurological: Negative for dizziness, tremors, sensory change, speech change and loss of consciousness.  Endo/Heme/Allergies: Negative.     Blood pressure 132/67, pulse 70, temperature 97.9 F (36.6 C), temperature source Oral, resp. rate 18, height 5' (1.524 m), weight 63.504 kg (140 lb), SpO2 94.00%. Physical Exam  Constitutional: She is oriented to person, place, and time. She appears well-developed and well-nourished. No distress.  HENT:  Head: Normocephalic and atraumatic.  Right Ear: External ear normal.  Nose: Nose normal.  Mouth/Throat: No oropharyngeal exudate.  Eyes: EOM are normal. Pupils are equal, round, and reactive to light. No scleral icterus.  Neck: Normal range of motion. Neck supple. No tracheal deviation present.  No posterior midline tenderness  Cardiovascular: Normal rate, regular rhythm, normal heart sounds and intact distal pulses.   No murmur heard. Respiratory: Effort normal and breath sounds normal. No stridor. No respiratory distress. She has no wheezes. She has no rales. She exhibits tenderness.    Abrasion with underlying tenderness right lateral chest wall, no crepitance  GI: Soft. Bowel sounds are normal. She exhibits no distension. There is no tenderness. There is no rebound and no guarding.  Lower midline scar,  small periumbilical incisional hernia  Musculoskeletal: Normal range of motion. She exhibits no edema and no tenderness.  Neurological: She is alert and oriented to person, place, and time. She exhibits normal muscle tone. GCS eye subscore is 4. GCS verbal subscore is 5. GCS motor subscore is 6.  Skin: Skin is warm.  Abrasion as above  Psychiatric: She has a normal mood and affect.     Assessment/Plan Status post fall with blunt force trauma to right chest wall and fractures of rib 8 and 9. Also a small apical pneumothorax. We'll proceed with CT scan of the chest with IV contrast. We will evaluate the size of her pneumothorax and the presence of any hemothorax. We'll admit her to the surgical intensive care unit and work on pulmonary toilet and pain control. Plan was discussed in detail with the patient and her son who is at the bedside.  Ricke Kimoto E 06/18/2012, 3:58 PM

## 2012-06-18 NOTE — ED Provider Notes (Signed)
Patient transferred from [facility] for evaluation of rib fracture with ptx.  She was seen by Dr. Preston Fleeting and xrays revealed these injuries.  There ct scanner was down and she was sent here for a ct of the chest in consultation with Dr. Janee Morn from [facility].  The patient has no additional complaints or injuries.  Her pain is controlled with medications given.  Surgery has come in to see the patient and will admit her to their service.  Geoffery Lyons, MD 06/18/12 [phone]

## 2012-06-18 NOTE — ED Notes (Signed)
Injury to rt lat chest,  Pt was pulling branches  Off a tree, fell and struck rt chest on broken tree trunk.  Hurts to take a deep breath.  No Loc.  Abrasion , contusion to rt lat chest

## 2012-06-18 NOTE — ED Provider Notes (Signed)
History     CSN: [id number]  Arrival date & time 06/18/12  1112   First MD Initiated Contact with Patient 06/18/12 1241      Chief Complaint  Patient presents with  . Chest Pain    (Consider location/radiation/quality/duration/timing/severity/associated sxs/prior treatment) Patient is a 72 y.o. female presenting with chest pain. The history is provided by the patient.  Chest Pain She was pulling their branches off of the bush when she fell and struck her right posterior lateral rib cage on a branch off the flexor he. She's complaining of pain in that site. Pain is worse if she moves or if she takes a breath but she is not short of breath. She denies other injury. She rates pain at 10/10 with movement but at 6/10 at rest. She denies head, neck, abdomen, extremity injury.  Past Medical History  Diagnosis Date  . Hypertension   . Hyperlipidemia   . Mitral valve prolapse     Mitral valve prolapse on echocardiogram in 2004 along with RVH and LVH  . Arteriosclerotic cardiovascular disease (ASCVD)     Cardiac catheterization in 2000: Report is unavailable-chart notes indicate insignificant coronary artery disease  . Colonic polyp     X3 in 2011 along with an ulceration of the cecum  . Anemia, normocytic normochromic 01/2012    H&H-10.6/32; low normal iron, normal ferritin, saturation-16%    Past Surgical History  Procedure Laterality Date  . Cholecystectomy    . Abdominal hysterectomy    . Colonoscopy  2005    Negative screening study  . Colonoscopy w/ polypectomy  2011    Cecal ulcer; tubular adenoma x3    Family History  Problem Relation Age of Onset  . Heart attack Mother 28    heart problems  . Cancer      FH  . Diabetes      FH  . Hypertension      FH  . Cancer Mother     uterus  . Pancreatitis Sister     died  age 36  . Diabetes Son   . Diabetes Daughter     History  Substance Use Topics  . Smoking status: Former Smoker    Quit date: 01/31/1987  .  Smokeless tobacco: Never Used     Comment: quit 20 + yrs ago  . Alcohol Use: Yes     Comment: occasionally    OB History   Grav Para Term Preterm Abortions TAB SAB Ect Mult Living                  Review of Systems  Cardiovascular: Positive for chest pain.  All other systems reviewed and are negative.    Allergies  Statins  Home Medications   Current Outpatient Rx  Name  Route  Sig  Dispense  Refill  . ALPRAZolam (XANAX) 0.5 MG tablet   Oral   Take 0.5 mg by mouth 2 (two) times daily as needed. Anxiety/sleep aid         . amLODipine-olmesartan (AZOR) 5-40 MG per tablet   Oral   Take 1 tablet by mouth daily.           Marland Kitchen aspirin 81 MG tablet   Oral   Take 81 mg by mouth daily.           . chlorthalidone (HYGROTON) 25 MG tablet   Oral   Take 25 mg by mouth daily.           Marland Kitchen  citalopram (CELEXA) 10 MG tablet   Oral   Take 10 mg by mouth daily.         . metFORMIN (GLUCOPHAGE) 500 MG tablet   Oral   Take 500 mg by mouth every evening.         . metoprolol succinate (TOPROL-XL) 25 MG 24 hr tablet   Oral   Take 25 mg by mouth daily.           . Multiple Vitamin (MULTIVITAMIN WITH MINERALS) TABS   Oral   Take 1 tablet by mouth daily.         . pravastatin (PRAVACHOL) 40 MG tablet   Oral   Take 40 mg by mouth 3 (three) times a week.           BP 151/42  Pulse 68  Temp(Src) 97.2 F (36.2 C) (Oral)  Resp 18  Ht 5' (1.524 m)  Wt 140 lb (63.504 kg)  BMI 27.34 kg/m2  SpO2 100%  Physical Exam  Nursing note and vitals reviewed.  72 year old female, resting comfortably and in no acute distress. Vital signs are significant for hypertension with blood pressure 151/42. Oxygen saturation is 100%, which is normal. Head is normocephalic and atraumatic. PERRLA, EOMI. Oropharynx is clear. Neck is nontender and supple without adenopathy or JVD. Back is nontender in the midline and there is no CVA tenderness. Lungs are clear without rales,  wheezes, or rhonchi. Chest has an abrasion present in the right lower posterior lateral rib cage with marked tenderness over that area. There is no crepitus.Marland Kitchen Heart has regular rate and rhythm without murmur. Abdomen is soft, flat, nontender without masses or hepatosplenomegaly and peristalsis is normoactive. Extremities have no cyanosis or edema, full range of motion is present. Skin is warm and dry without rash. Neurologic: Mental status is normal, cranial nerves are intact, there are no motor or sensory deficits.  ED Course  Procedures (including critical care time)  Results for orders placed during the hospital encounter of 06/18/12  CBC WITH DIFFERENTIAL      Result Value Range   WBC 10.8 (*) 4.0 - 10.5 K/uL   RBC 3.95  3.87 - 5.11 MIL/uL   Hemoglobin 12.4  12.0 - 15.0 g/dL   HCT 09.8  11.9 - 14.7 %   MCV 93.2  78.0 - 100.0 fL   MCH 31.4  26.0 - 34.0 pg   MCHC 33.7  30.0 - 36.0 g/dL   RDW 82.9  56.2 - 13.0 %   Platelets 374  150 - 400 K/uL   Neutrophils Relative 73  43 - 77 %   Neutro Abs 7.9 (*) 1.7 - 7.7 K/uL   Lymphocytes Relative 19  12 - 46 %   Lymphs Abs 2.0  0.7 - 4.0 K/uL   Monocytes Relative 6  3 - 12 %   Monocytes Absolute 0.6  0.1 - 1.0 K/uL   Eosinophils Relative 2  0 - 5 %   Eosinophils Absolute 0.3  0.0 - 0.7 K/uL   Basophils Relative 0  0 - 1 %   Basophils Absolute 0.0  0.0 - 0.1 K/uL  COMPREHENSIVE METABOLIC PANEL      Result Value Range   Sodium 134 (*) 135 - 145 mEq/L   Potassium 4.8  3.5 - 5.1 mEq/L   Chloride 95 (*) 96 - 112 mEq/L   CO2 26  19 - 32 mEq/L   Glucose, Bld 117 (*) 70 - 99 mg/dL  BUN 38 (*) 6 - 23 mg/dL   Creatinine, Ser 7.82  0.50 - 1.10 mg/dL   Calcium 95.6  8.4 - 21.3 mg/dL   Total Protein 8.4 (*) 6.0 - 8.3 g/dL   Albumin 4.6  3.5 - 5.2 g/dL   AST 30  0 - 37 U/L   ALT 22  0 - 35 U/L   Alkaline Phosphatase 91  39 - 117 U/L   Total Bilirubin 0.3  0.3 - 1.2 mg/dL   GFR calc non Af Amer 49 (*) >90 mL/min   GFR calc Af Amer 57 (*)  >90 mL/min  LACTIC ACID, PLASMA      Result Value Range   Lactic Acid, Venous 1.3  0.5 - 2.2 mmol/L  URINALYSIS, ROUTINE W REFLEX MICROSCOPIC      Result Value Range   Color, Urine YELLOW  YELLOW   APPearance CLEAR  CLEAR   Specific Gravity, Urine 1.025  1.005 - 1.030   pH 6.0  5.0 - 8.0   Glucose, UA NEGATIVE  NEGATIVE mg/dL   Hgb urine dipstick NEGATIVE  NEGATIVE   Bilirubin Urine NEGATIVE  NEGATIVE   Ketones, ur TRACE (*) NEGATIVE mg/dL   Protein, ur NEGATIVE  NEGATIVE mg/dL   Urobilinogen, UA 0.2  0.0 - 1.0 mg/dL   Nitrite NEGATIVE  NEGATIVE   Leukocytes, UA SMALL (*) NEGATIVE  URINE MICROSCOPIC-ADD ON      Result Value Range   Squamous Epithelial / LPF FEW (*) RARE   WBC, UA 0-2  <3 WBC/hpf   RBC / HPF 3-6  <3 RBC/hpf   Bacteria, UA FEW (*) RARE   Dg Ribs Unilateral W/chest Right  06/18/2012  *RADIOLOGY REPORT*  Clinical Data: Limb fell and struck the patient in back, pain and swelling over right scapula  RIGHT RIBS AND CHEST - 3+ VIEW  Comparison: Chest radiograph 01/31/2012  Findings: Normal heart size, mediastinal contours, and pulmonary vascularity. Calcified granuloma mid right lung again identified. Small right apex pneumothorax. No pulmonary infiltrate or definite pleural effusion. Bones appear mildly demineralized. Atherosclerotic calcification noted within thoracic aorta. Mildly displaced fractures of the right eighth and ninth ribs noted. No additional focal bony abnormalities.  IMPRESSION: Fractures of the posterior right eighth and ninth ribs. Small right apex pneumothorax.  Findings called to Dr. Preston Fleeting on 06/18/2012 at 1240 hours.   Original Report Authenticated By: Ulyses Southward, M.D.     Images viewed by me, discussed with radiologist.  1. Fall at home, initial encounter   2. Closed rib fracture, right, initial encounter   3. Pneumothorax, right       MDM  Fall with chest injury with rib fractures and small pneumothorax. She will be sent for CT to clarify extent  of injury. Because of pneumothorax, she will need to be transferred to [facility] cone the trauma service.  CT scanner at any panel hospital is off-line for repairs. Case is discussed with Dr. Janee Morn of trauma surgery service who requests the patient be sent to the ED at [name] where she can get CT of her chest. Case is also discussed with Dr.Pollina, ED physician at [facility], agrees to accept the patient in transfer.       Dione Booze, MD 06/18/12 [phone]

## 2012-06-18 NOTE — ED Notes (Signed)
Report given to [facility]. Pt advised they are on way.

## 2012-06-19 ENCOUNTER — Inpatient Hospital Stay (HOSPITAL_COMMUNITY): Payer: Medicare Other

## 2012-06-19 DIAGNOSIS — I10 Essential (primary) hypertension: Secondary | ICD-10-CM | POA: Diagnosis not present

## 2012-06-19 DIAGNOSIS — S2239XA Fracture of one rib, unspecified side, initial encounter for closed fracture: Secondary | ICD-10-CM | POA: Diagnosis not present

## 2012-06-19 DIAGNOSIS — S270XXA Traumatic pneumothorax, initial encounter: Secondary | ICD-10-CM | POA: Diagnosis not present

## 2012-06-19 DIAGNOSIS — S2249XA Multiple fractures of ribs, unspecified side, initial encounter for closed fracture: Secondary | ICD-10-CM | POA: Diagnosis not present

## 2012-06-19 DIAGNOSIS — S7000XA Contusion of unspecified hip, initial encounter: Secondary | ICD-10-CM | POA: Diagnosis not present

## 2012-06-19 LAB — CBC
Hemoglobin: 9.8 g/dL — ABNORMAL LOW (ref 12.0–15.0)
MCHC: 34.1 g/dL (ref 30.0–36.0)
Platelets: 292 10*3/uL (ref 150–400)
RDW: 13.2 % (ref 11.5–15.5)

## 2012-06-19 LAB — BASIC METABOLIC PANEL
GFR calc Af Amer: 44 mL/min — ABNORMAL LOW (ref >90)
GFR calc non Af Amer: 38 mL/min — ABNORMAL LOW (ref >90)
Potassium: 3.9 mEq/L (ref 3.5–5.1)
Sodium: 133 mEq/L — ABNORMAL LOW (ref 135–145)

## 2012-06-19 MED ORDER — SODIUM CHLORIDE 0.9 % IV SOLN
INTRAVENOUS | Status: DC
  Administered 2012-06-19 – 2012-06-20 (×3): via INTRAVENOUS

## 2012-06-19 NOTE — Progress Notes (Signed)
Patient ID: Mackenzie [name], female   DOB: 09-27-1940, 72 y.o.   MRN: [id number]   LOS: 1 day   Subjective: Pt did well overnight.  Complaining of right sided hip pain, ecchymosis noted on the side.  Denies shortness of breath, chest pains or palpitations.  I helped the patient to the chair, did fairly well with minimal assistance.  She is tolerating diet, no dysuria.   She lives at home by herself, 1 level.  Has a son and daughter as well as neighbors nearby for assistance.  Objective: Vital signs in last 24 hours: Temp:  [97.2 F (36.2 C)-99.1 F (37.3 C)] 99.1 F (37.3 C) (05/06 0724) Pulse Rate:  [56-70] 67 (05/06 0725) Resp:  [9-18] 15 (05/06 0725) BP: (100-151)/(42-67) 101/45 mmHg (05/06 0725) SpO2:  [94 %-100 %] 98 % (05/06 0725) Weight:  [140 lb (63.504 kg)] 140 lb (63.504 kg) (05/05 1700) Last BM Date: 06/18/12  Lab Results:  CBC  Recent Labs  06/18/12 1252 06/19/12 0520  WBC 10.8* 7.3  HGB 12.4 9.8*  HCT 36.8 28.7*  PLT 374 292   BMET  Recent Labs  06/18/12 1252 06/18/12 1738 06/19/12 0520  NA 134*  --  133*  K 4.8  --  3.9  CL 95*  --  97  CO2 26  --  27  GLUCOSE 117*  --  109*  BUN 38*  --  38*  CREATININE 1.09 1.15* 1.34*  CALCIUM 10.4  --  9.1    Imaging: Dg Ribs Unilateral W/chest Right  06/18/2012  *RADIOLOGY REPORT*  Clinical Data: Limb fell and struck the patient in back, pain and swelling over right scapula  RIGHT RIBS AND CHEST - 3+ VIEW  Comparison: Chest radiograph 01/31/2012  Findings: Normal heart size, mediastinal contours, and pulmonary vascularity. Calcified granuloma mid right lung again identified. Small right apex pneumothorax. No pulmonary infiltrate or definite pleural effusion. Bones appear mildly demineralized. Atherosclerotic calcification noted within thoracic aorta. Mildly displaced fractures of the right eighth and ninth ribs noted. No additional focal bony abnormalities.  IMPRESSION: Fractures of the posterior right eighth and ninth  ribs. Small right apex pneumothorax.  Findings called to Dr. Preston Fleeting on 06/18/2012 at 1240 hours.   Original Report Authenticated By: Ulyses Southward, M.D.    Ct Chest W Contrast  06/18/2012  *RADIOLOGY REPORT*  Clinical Data: Post fall, now with right-sided chest pain, exacerbated by deep inspiration  CT CHEST WITH CONTRAST  Technique:  Multidetector CT imaging of the chest was performed following the standard protocol during bolus administration of intravenous contrast.  Contrast: 80mL OMNIPAQUE IOHEXOL 300 MG/ML  SOLN  Comparison: Chest radiograph - earlier same day  Findings:  There are minimally displaced fractures of the lateral and posterior aspect of the right sixth seventh through ninth ribs. This finding is associated with a tiny right-sided hydropneumothorax and minimal amount of subcutaneous emphysema about the right posterior and lateral chest wall.  No left-sided pleural effusion or pneumothorax.  Note is made of an approximately 8 x 5 mm calcified granuloma within the superior segment right lower lobe (image 28, series 3). This finding is associated with calcified right hilar lymph nodes, the sequela of prior granulomas infection.  No discrete noncalcified pulmonary nodules.  There is minimal grossly symmetric subpleural ground-glass opacities favored to represent atelectasis. No focal airspace opacity.  The central pulmonary airways are patent.  No mediastinal, hilar or axillary lymphadenopathy.  Normal heart size.  Coronary artery calcifications.  Calcifications within the mitral  valve annulus.  Trace amount of pericardial fluid, presumably physiologic.  There is scattered atherosclerotic plaque within a normal caliber thoracic aorta.  Conventional configuration of the aortic arch.  The visualized portions of the aortic arch widely patent.  Early arterial phase evaluation of the superior aspect of the abdomen demonstrates the sequela of prior cholecystectomy.  IMPRESSION: 1.  Minimally displaced right  sided seventh through ninth rib fractures with associated tiny right-sided hydropneumothorax and minimal amount of adjacent subcutaneous emphysema. 2.  Sequela of prior granulomatous disease as above. 3.  Coronary artery calcifications.   Original Report Authenticated By: Tacey Ruiz, MD     PE:  General appearance: alert, cooperative and appears stated age  Head: Normocephalic, atraumatic,  without obvious abnormality Eyes: conjunctivae/corneas clear. PERRL, EOM's intact. Fundi benign. Resp: clear to auscultation bilaterally.  Bruising and swelling right lateral chest wall. Cardio: regular rate and rhythm, S1, S2 normal, no murmur, click, rub or gallop and +2 pedal pulses, no edema.  GI: soft, non-tender; bowel sounds normal; no masses, no organomegaly and no ecchymosis  Pulses: 2+ and symmetric  Skin: Skin color, texture, turgor normal. No rashes or lesions Extremities: right hip, normal ROM, able to bear weight, ecchymosis right lateral aspect of the hip. Neurologic: Alert and oriented X 3, normal strength and tone. Normal symmetric reflexes. Normal coordination and gait   Patient Active Problem List   Diagnosis Date Noted  . Multiple fractures of ribs of right side 06/18/2012  . Pneumothorax on right 06/18/2012  . Unstable angina 01/31/2012  . Cerebrovascular disease 01/31/2012  . Arteriosclerotic cardiovascular disease (ASCVD)   . Hyperlipidemia   . Hypertension   . Anemia, normocytic normochromic 01/15/2012  . MITRAL VALVE PROLAPSE 12/24/2008   Assessment/Plan  S/p Fall Right 7th, 8th and 9th rib fractures with hydropneumothorax -VSS, she maintained oxygen saturation overnight -repeat cxr today to ensure no increase in pneumothorax. -Incentive spirometry -ambulate -PT to evaluate mobility and safe discharge home -pain control  Anemia -h&h 9.8/28.7  -monitor  Right hip pain -obtain xr to rule out a fracture.  Hyperlipidemia -pt only able to tolerate pravastatin 3x  per week.  DC atorvastatin, pt to resume home medication after discharge  ARI -likely due to IV contrast -increase fluids -repeat BMET in AM  VTE - SCD's, Lovenox, ambulate  FEN - tolerating diet  Dispo --if cxr is stable, transfer to floor, discharge tomorrow  **verified home medication with Dr. Alonza Smoker office, pt does not take metformin**   Ashok Norris, ANP-BC Pager: [phone] General Trauma [place] Pager: [phone]   06/19/2012

## 2012-06-19 NOTE — Progress Notes (Signed)
Patient examined and I agree with the assessment and plan Check cxr and hip x-ray. I spoke to her son at the bedside as well as her friend that can provide 24h assist at D/C. Violeta Gelinas, MD, MPH, FACS Pager: [phone]  06/19/2012 10:31 AM

## 2012-06-19 NOTE — Clinical Social Work Note (Signed)
Clinical Social Work Department BRIEF PSYCHOSOCIAL ASSESSMENT 06/19/2012  Patient:  Mackenzie [name]     Account Number:  [id number]     Admit date:  06/18/2012  Clinical Social Worker:  Verl Blalock  Date/Time:  06/19/2012 02:00 PM  Referred by:  Physician  Date Referred:  06/19/2012 Referred for  Psychosocial assessment   Other Referral:   Interview type:  Patient Other interview type:   Patient son, Mackenzie [name], at bedside    PSYCHOSOCIAL DATA Living Status:  ALONE Admitted from facility:   Level of care:   Primary support name:  [name],Mackenzie   [phone] Primary support relationship to patient:  CHILD, ADULT Degree of support available:   Strong    CURRENT CONCERNS Current Concerns  None Noted   Other Concerns:    SOCIAL WORK ASSESSMENT / PLAN Clinical Social Worker met with patient and patient son at bedside to offer support and discuss patient needs at discharge.  Patient states that she had gone for her morning work and when she returned she started to pick up tree limbs and pull broken ones from the tree when she fell backwards.  Patient knew immediately that something was wrong and was brought in to [name] and transferred to [name].  Patient son is an employee with Patient Placement at [employer] [facility] [employer] as well.  Patient states that she lives alone, however she has many friends and family members to assist her at discharge.  Patient and patient son agreed that SNF is not option for patient at this time and they will make 24 hour support available as needed.  Patient would like to think about the idea of home health - CM notified of [facility] if home health is ordered.  Patient son likely to provide transportation to patient at discharge.    Clinical Social Worker inquired about current substance use.  Patient states that there was alcohol involved at the time of the trauma and is comfortable with current use. SBIRT complete and no resources needed at this time.  CSW signing off  at this time.  No further social work needs identified - please reconsult if further needs arise.   Assessment/plan status:  Psychosocial Support/Ongoing Assessment of Needs Other assessment/ plan:   Information/referral to community resources:   Patient and patient son both state that they have necessary resources and equipment at discharge - CM aware.    PATIENT'S/FAMILY'S RESPONSE TO PLAN OF CARE: Patient alert and oriented x3 sitting up in bed.  Patient and patient son discussing potential home health, but have clearly stated that patient will be returning home with assistance.  Patient not completely realistic regarding her injury and ability to recover.  Patient son seems realistic regarding patient needs and plans to push patient in the right direction.  Patient verbalized her appreciation for CSW support and concern.   Mackenzie [name], [place] [phone]

## 2012-06-19 NOTE — Evaluation (Signed)
Physical Therapy Evaluation Patient Details Name: Mackenzie [name] MRN: [id number] DOB: [birth year] Today's Date: 06/19/2012 Time: 4098-1191 PT Time Calculation (min): 15 min  PT Assessment / Plan / Recommendation Clinical Impression  Patient is a 72 y/o female admitted s/p fall with multiple right rib fractures and hydropneumothorax.  Brief session due to on the way to x-ray and reports right hip bruise.  Feel limitations mainly due to pain and her significant other reports he can stay with her to assist as needed.  LIkely no DME needs, but will benefit from HHPT. Will follow acutely.    PT Assessment  Patient needs continued PT services    Follow Up Recommendations  Home health PT;Supervision/Assistance - 24 hour          Equipment Recommendations  None recommended by PT       Frequency Min 5X/week    Precautions / Restrictions Precautions Precautions: Fall   Pertinent Vitals/Pain 5/10 right ribs      Mobility  Bed Mobility Bed Mobility: Not assessed Details for Bed Mobility Assistance: pt out of bed in chair Transfers Transfers: Sit to Stand;Stand to Sit Sit to Stand: 5: Supervision;With armrests;From chair/3-in-1 Stand to Sit: 5: Supervision;To chair/3-in-1;With upper extremity assist Details for Transfer Assistance: cues for safety with backing up to wheelchair (going to x-ray) Ambulation/Gait Ambulation/Gait Assistance: 4: Min assist Ambulation Distance (Feet): 20 Feet Assistive device: None Ambulation/Gait Assistance Details: slightly antalgic on right Gait Pattern: Step-through pattern;Decreased stride length;Trunk flexed;Antalgic        PT Diagnosis: Difficulty walking;Acute pain  PT Problem List: Decreased activity tolerance;Decreased mobility;Pain PT Treatment Interventions: DME instruction;Gait training;Stair training;Balance training;Functional mobility training;Therapeutic activities;Therapeutic exercise;Patient/family education   PT Goals Acute Rehab PT  Goals PT Goal Formulation: With patient Time For Goal Achievement: 06/26/12 Potential to Achieve Goals: Good Pt will go Supine/Side to Sit: with supervision PT Goal: Supine/Side to Sit - Progress: Goal set today Pt will go Sit to Supine/Side: with supervision PT Goal: Sit to Supine/Side - Progress: Goal set today Pt will go Sit to Stand: with modified independence PT Goal: Sit to Stand - Progress: Goal set today Pt will Ambulate: >150 feet;with modified independence;with least restrictive assistive device PT Goal: Ambulate - Progress: Goal set today Pt will Go Up / Down Stairs: 3-5 stairs;with rail(s);with supervision PT Goal: Up/Down Stairs - Progress: Goal set today Pt will Perform Home Exercise Program: Independently PT Goal: Perform Home Exercise Program - Progress: Goal set today  Visit Information  Last PT Received On: 06/19/12 Assistance Needed: +1    Subjective Data  Subjective: Just was trying to pull branches out of trees and fell over. Patient Stated Goal: To return to independent   Prior Functioning  Home Living Lives With: Alone Available Help at Discharge: Friend(s);Available 24 hours/day Type of Home: House Home Access: Stairs to enter [employer] of Steps: 4 Entrance Stairs-Rails: Right Home Layout: One level Bathroom Shower/Tub: Tub/shower unit;Walk-in shower Home Adaptive Equipment: Walker - rolling (equipment from mother) Prior Function Level of Independence: Independent Able to Take Stairs?: Yes Driving: Yes Communication Communication: No difficulties    Cognition  Cognition Arousal/Alertness: Awake/alert Behavior During Therapy: WFL for tasks assessed/performed Overall Cognitive Status: Within Functional Limits for tasks assessed    Extremity/Trunk Assessment Right Lower Extremity Assessment RLE ROM/Strength/Tone: Deficits;Due to pain RLE ROM/Strength/Tone Deficits: AROM WFL, pain with increased weight right hip (as with testing left  hip flexion,)  knee extension, ankle dorsiflexion WNL RLE Sensation: WFL - Light Touch Left Lower Extremity  Assessment LLE ROM/Strength/Tone: Within functional levels LLE Sensation: WFL - Light Touch      End of Session PT - End of Session Equipment Utilized During Treatment: Gait belt Activity Tolerance: Patient tolerated treatment well Patient left: in chair;with family/visitor present  GP     [facility] 06/19/2012, 11:07 AM Sheran Lawless, PT [phone] 06/19/2012

## 2012-06-20 DIAGNOSIS — S270XXA Traumatic pneumothorax, initial encounter: Secondary | ICD-10-CM | POA: Diagnosis not present

## 2012-06-20 DIAGNOSIS — I10 Essential (primary) hypertension: Secondary | ICD-10-CM | POA: Diagnosis not present

## 2012-06-20 DIAGNOSIS — S2249XA Multiple fractures of ribs, unspecified side, initial encounter for closed fracture: Secondary | ICD-10-CM | POA: Diagnosis not present

## 2012-06-20 LAB — BASIC METABOLIC PANEL
CO2: 26 mEq/L (ref 19–32)
GFR calc non Af Amer: 60 mL/min — ABNORMAL LOW (ref >90)
Glucose, Bld: 107 mg/dL — ABNORMAL HIGH (ref 70–99)
Potassium: 4 mEq/L (ref 3.5–5.1)
Sodium: 137 mEq/L (ref 135–145)

## 2012-06-20 LAB — CBC
Hemoglobin: 9.7 g/dL — ABNORMAL LOW (ref 12.0–15.0)
RBC: 3.09 MIL/uL — ABNORMAL LOW (ref 3.87–5.11)

## 2012-06-20 MED ORDER — ACETAMINOPHEN 325 MG PO TABS: 650.0000 mg | ORAL_TABLET | Freq: Four times a day (QID) | ORAL | Status: DC | PRN

## 2012-06-20 MED ORDER — OXYCODONE HCL 5 MG PO TABS: 5.0000 mg | ORAL_TABLET | ORAL | Status: DC | PRN

## 2012-06-20 NOTE — Progress Notes (Signed)
Pt d/cing to home with family, VSS, d/c instructions given and pt verbalizes understanding, prescriptions given

## 2012-06-20 NOTE — Discharge Summary (Signed)
Physician Discharge Summary  Mackenzie [name] HYQ:[id number] DOB: [birth year] DOA: 06/18/2012  PCP: Carylon Perches, MD  Consultation: none  Admit date: 06/18/2012 Discharge date: 06/20/2012  Recommendations for Outpatient Follow-up:  1. Pt will be discharged home with physical therapy.  She will have 24 hour supervision between her significant other, son and neighbors.  Follow-up Information   Schedule an appointment as soon as possible for a visit with FAGAN,ROY, MD. (please be sure to follow up with your primary care doctor in 1-2 weeks.)    Contact information:   [address] [place] [address] [phone]       Call [facility]. (please call our clinic with any questions or concerns.)    Contact information:   [address] [place] [address] [phone]      Discharge Diagnoses:  1. Right sided rib fractures, 8th and 9th 2. Small right apical pneumothorax   Surgical Procedure: none  Discharge Condition: stable Disposition: discharge home with 24 hour supervision  Diet recommendation: regular  Filed Weights   06/18/12 1121 06/18/12 1700  Weight: 140 lb (63.504 kg) 140 lb (63.504 kg)       Hospital Course:  [name] is a pleasant 72 year old female with a history of hypertension, hyperlipidemia, ASCVD, anemia and CHF who presented to [facility] ED from [facility] ED after falling at home while pulling down dead braches.  She did not have LOC, but experienced right sided rib pain.  She was found to have 8th and 9th rib fractures on the right with a small apical pneumothorax.  A CT of chest showed above pathology.  She was admitted to stepdown for continues pulse oximetry and close monitoring.  Her pain was adequately treated with IV and po medication.  She was evaluated by physical therapy who recommended 24hr assistance and home physical therapy.  On hospital day 2, a repeat chest x ray was stable.  A right hip xray was obtained due to  complaints of pain and bruising.  It did not show a fracture.  She was able to bear weight and her exam was unremarkable.  This will need to be further evaluated with imaging should her symptoms persist.  She was ambulating quite well.   Denied shortness of breath.  VS remained stable with an adequate oxygen saturation.  Her home medication were resumed.  I called Dr. Alonza Smoker office because I was unsure if she was taking metformin as she does not have a diagnosis of diabetes mellitus, Dr. Alonza Smoker office confirmed she was not on the medication.  She was stable for discharge.  Home PT arranged by case manager. The patient and I discussed warning signs that warrant immediate attention.  She was encouraged to use IS q1h while awake which she demonstrated correctly for me.  I encouraged her to take pain medication as needed.  She will take miralax daily as needed for constipation.  She was advised to avoid driving or operating heavy machinery.  She will follow up with pcp in 1-2 weeks.   General appearance: alert and oriented. Calm and cooperative No acute distress. VSS. Afebrile.  Resp: clear to auscultation bilaterally.  Right lateral and posterior TTP.   Cardio: S1S1 RRR without murmurs or gallops. No edema. GI: soft round and nontender. +BS x4 quadrants. No organomegaly, hernias or masses.  Pulses: +2 bilaterally without cyanosis. Neurologic: Mental status: Alert, oriented, thought content appropriate  Psychiatric: calm and cooperative  Discharge Instructions   Future Appointments Provider Department Dept Phone   07/03/2012 2:30 PM Malissa Hippo, MD [facility] [phone]       Medication List    STOP taking these medications       metFORMIN 500 MG tablet  Commonly known as:  GLUCOPHAGE      TAKE these medications       acetaminophen 325 MG tablet  Commonly known as:  TYLENOL  Take 2 tablets (650 mg total) by mouth every 6 (six) hours as needed.      ALPRAZolam 0.5 MG tablet  Commonly known as:  XANAX  Take 0.5 mg by mouth 2 (two) times daily as needed. Anxiety/sleep aid     aspirin 81 MG tablet  Take 81 mg by mouth daily.     AZOR 5-40 MG per tablet  Generic drug:  amLODipine-olmesartan  Take 1 tablet by mouth daily.     chlorthalidone 25 MG tablet  Commonly known as:  HYGROTON  Take 25 mg by mouth daily.     citalopram 10 MG tablet  Commonly known as:  CELEXA  Take 10 mg by mouth daily.     metoprolol succinate 25 MG 24 hr tablet  Commonly known as:  TOPROL-XL  Take 25 mg by mouth daily.     multivitamin with minerals Tabs  Take 1 tablet by mouth daily.     oxyCODONE 5 MG immediate release tablet  Commonly known as:  Oxy IR/ROXICODONE  Take 1 tablet (5 mg total) by mouth every 4 (four) hours as needed.     pravastatin 40 MG tablet  Commonly known as:  PRAVACHOL  Take 40 mg by mouth 3 (three) times a week.           Follow-up Information   Schedule an appointment as soon as possible for a visit with FAGAN,ROY, MD. (please be sure to follow up with your primary care doctor in 1-2 weeks.)    Contact information:   [address] [place] [address] [phone]       Call [facility]. (please call our clinic with any questions or concerns.)    Contact information:   [address] [place] [address] [phone]        The results of significant diagnostics from this hospitalization (including imaging, microbiology, ancillary and laboratory) are listed below for reference.    Significant Diagnostic Studies: Dg Chest 2 View  06/19/2012  *RADIOLOGY REPORT*  Clinical Data: Follow-up rib fractures and pneumothorax.  CHEST - 2 VIEW  Comparison: Radiographs and CT 06/18/2012.  Findings: Mildly displaced fractures of the right eighth and ninth ribs are unchanged.  There is a stable small right apical pneumothorax.  The lungs appear stable with a stable calcified right  lower lobe granuloma.  There is no significant pleural effusion or mediastinal shift.  Heart size and mediastinal contours are stable.  IMPRESSION: Stable right-sided rib fractures and small right-sided pneumothorax.  No new findings identified.   Original Report Authenticated By: Carey Bullocks, M.D.    Dg Ribs Unilateral W/chest Right  06/18/2012  *RADIOLOGY REPORT*  Clinical Data: Limb fell and struck the patient in back, pain and swelling over right scapula  RIGHT RIBS AND CHEST - 3+ VIEW  Comparison: Chest radiograph 01/31/2012  Findings: Normal heart size, mediastinal contours, and pulmonary vascularity. Calcified granuloma mid right lung again identified. Small right apex pneumothorax. No pulmonary infiltrate  or definite pleural effusion. Bones appear mildly demineralized. Atherosclerotic calcification noted within thoracic aorta. Mildly displaced fractures of the right eighth and ninth ribs noted. No additional focal bony abnormalities.  IMPRESSION: Fractures of the posterior right eighth and ninth ribs. Small right apex pneumothorax.  Findings called to Dr. Preston Fleeting on 06/18/2012 at 1240 hours.   Original Report Authenticated By: Ulyses Southward, M.D.    Dg Hip Complete Right  06/19/2012  *RADIOLOGY REPORT*  Clinical Data: Right hip pain and bruising since falling yesterday.  RIGHT HIP - COMPLETE 2+ VIEW  Comparison: One-view pelvis 09/30/2005.  Findings: The bones are mildly demineralized.  No acute fracture or dislocation is identified.  The hip and sacroiliac joint spaces are maintained.  There may be mild asymmetric soft tissue swelling superior to the right femoral greater trochanter.  IMPRESSION:  1.  No radiographic evidence of acute fracture or dislocation. Possible mild right lateral soft tissue swelling. 2. If the patient has persistent unexplained pain or inability to bear weight, follow-up or additional imaging may be warranted as acute fractures can be radiographically occult in the elderly.    Original Report Authenticated By: Carey Bullocks, M.D.    Ct Chest W Contrast  06/18/2012  *RADIOLOGY REPORT*  Clinical Data: Post fall, now with right-sided chest pain, exacerbated by deep inspiration  CT CHEST WITH CONTRAST  Technique:  Multidetector CT imaging of the chest was performed following the standard protocol during bolus administration of intravenous contrast.  Contrast: 80mL OMNIPAQUE IOHEXOL 300 MG/ML  SOLN  Comparison: Chest radiograph - earlier same day  Findings:  There are minimally displaced fractures of the lateral and posterior aspect of the right sixth seventh through ninth ribs. This finding is associated with a tiny right-sided hydropneumothorax and minimal amount of subcutaneous emphysema about the right posterior and lateral chest wall.  No left-sided pleural effusion or pneumothorax.  Note is made of an approximately 8 x 5 mm calcified granuloma within the superior segment right lower lobe (image 28, series 3). This finding is associated with calcified right hilar lymph nodes, the sequela of prior granulomas infection.  No discrete noncalcified pulmonary nodules.  There is minimal grossly symmetric subpleural ground-glass opacities favored to represent atelectasis. No focal airspace opacity.  The central pulmonary airways are patent.  No mediastinal, hilar or axillary lymphadenopathy.  Normal heart size.  Coronary artery calcifications.  Calcifications within the mitral valve annulus.  Trace amount of pericardial fluid, presumably physiologic.  There is scattered atherosclerotic plaque within a normal caliber thoracic aorta.  Conventional configuration of the aortic arch.  The visualized portions of the aortic arch widely patent.  Early arterial phase evaluation of the superior aspect of the abdomen demonstrates the sequela of prior cholecystectomy.  IMPRESSION: 1.  Minimally displaced right sided seventh through ninth rib fractures with associated tiny right-sided hydropneumothorax and  minimal amount of adjacent subcutaneous emphysema. 2.  Sequela of prior granulomatous disease as above. 3.  Coronary artery calcifications.   Original Report Authenticated By: Tacey Ruiz, MD     Microbiology: Recent Results (from the past 240 hour(s))  MRSA PCR SCREENING     Status: None   Collection Time    06/18/12  5:17 PM      Result Value Range Status   MRSA by PCR NEGATIVE  NEGATIVE Final   Comment:            The GeneXpert MRSA Assay (FDA     approved for NASAL specimens  only), is one component of a     comprehensive MRSA colonization     surveillance program. It is not     intended to diagnose MRSA     infection nor to guide or     monitor treatment for     MRSA infections.     Labs: Basic Metabolic Panel:  Recent Labs Lab 06/18/12 1252 06/18/12 1738 06/19/12 0520 06/20/12 0400  NA 134*  --  133* 137  K 4.8  --  3.9 4.0  CL 95*  --  97 103  CO2 26  --  27 26  GLUCOSE 117*  --  109* 107*  BUN 38*  --  38* 26*  CREATININE 1.09 1.15* 1.34* 0.92  CALCIUM 10.4  --  9.1 8.8   Liver Function Tests:  Recent Labs Lab 06/18/12 1252  AST 30  ALT 22  ALKPHOS 91  BILITOT 0.3  PROT 8.4*  ALBUMIN 4.6   No results found for this basename: LIPASE, AMYLASE,  in the last 168 hours No results found for this basename: AMMONIA,  in the last 168 hours CBC:  Recent Labs Lab 06/18/12 1252 06/19/12 0520 06/20/12 0400  WBC 10.8* 7.3 7.9  NEUTROABS 7.9*  --   --   HGB 12.4 9.8* 9.7*  HCT 36.8 28.7* 28.3*  MCV 93.2 91.7 91.6  PLT 374 292 281    Active Problems:   Multiple fractures of ribs of right side   Pneumothorax on right   Time coordinating discharge: 20 mins  Signed:  Kaeli Nichelson, ANP-BC

## 2012-06-20 NOTE — Discharge Summary (Signed)
Patient doing well enough to go home.  This patient has been seen and I agree with the findings and treatment plan.  Marta Lamas. Gae Bon, MD, FACS [phone] (pager) [phone] (direct pager) Trauma Surgeon

## 2012-06-20 NOTE — Progress Notes (Signed)
Spoke with pt and her sig other in room re: HH needs for discharge. Confirmed the address and both phone numbers in EPIC are correct. Pt reports having spoken with her son (a hospital employee) and a relative who has had HH in the past and they recommended [facility].  Arrangements made for HHPT. There were no DME needs noted by therapists and pt reports having several pieces of DME at home from a relative who used them in the past.

## 2012-06-22 DIAGNOSIS — J9383 Other pneumothorax: Secondary | ICD-10-CM | POA: Diagnosis not present

## 2012-06-22 DIAGNOSIS — IMO0001 Reserved for inherently not codable concepts without codable children: Secondary | ICD-10-CM | POA: Diagnosis not present

## 2012-06-22 DIAGNOSIS — I10 Essential (primary) hypertension: Secondary | ICD-10-CM | POA: Diagnosis not present

## 2012-06-22 DIAGNOSIS — I2510 Atherosclerotic heart disease of native coronary artery without angina pectoris: Secondary | ICD-10-CM | POA: Diagnosis not present

## 2012-06-22 DIAGNOSIS — Z9181 History of falling: Secondary | ICD-10-CM | POA: Diagnosis not present

## 2012-06-22 DIAGNOSIS — D649 Anemia, unspecified: Secondary | ICD-10-CM | POA: Diagnosis not present

## 2012-06-25 DIAGNOSIS — I10 Essential (primary) hypertension: Secondary | ICD-10-CM | POA: Diagnosis not present

## 2012-06-25 DIAGNOSIS — J9383 Other pneumothorax: Secondary | ICD-10-CM | POA: Diagnosis not present

## 2012-06-25 DIAGNOSIS — I2510 Atherosclerotic heart disease of native coronary artery without angina pectoris: Secondary | ICD-10-CM | POA: Diagnosis not present

## 2012-06-25 DIAGNOSIS — D649 Anemia, unspecified: Secondary | ICD-10-CM | POA: Diagnosis not present

## 2012-06-25 DIAGNOSIS — IMO0001 Reserved for inherently not codable concepts without codable children: Secondary | ICD-10-CM | POA: Diagnosis not present

## 2012-06-28 DIAGNOSIS — D649 Anemia, unspecified: Secondary | ICD-10-CM | POA: Diagnosis not present

## 2012-06-28 DIAGNOSIS — I2510 Atherosclerotic heart disease of native coronary artery without angina pectoris: Secondary | ICD-10-CM | POA: Diagnosis not present

## 2012-06-28 DIAGNOSIS — J9383 Other pneumothorax: Secondary | ICD-10-CM | POA: Diagnosis not present

## 2012-06-28 DIAGNOSIS — IMO0001 Reserved for inherently not codable concepts without codable children: Secondary | ICD-10-CM | POA: Diagnosis not present

## 2012-06-28 DIAGNOSIS — I10 Essential (primary) hypertension: Secondary | ICD-10-CM | POA: Diagnosis not present

## 2012-06-29 DIAGNOSIS — S2249XA Multiple fractures of ribs, unspecified side, initial encounter for closed fracture: Secondary | ICD-10-CM | POA: Diagnosis not present

## 2012-06-29 DIAGNOSIS — J9311 Primary spontaneous pneumothorax: Secondary | ICD-10-CM | POA: Diagnosis not present

## 2012-07-03 ENCOUNTER — Ambulatory Visit (INDEPENDENT_AMBULATORY_CARE_PROVIDER_SITE_OTHER): Payer: Medicare Other | Admitting: Internal Medicine

## 2012-07-03 DIAGNOSIS — I10 Essential (primary) hypertension: Secondary | ICD-10-CM | POA: Diagnosis not present

## 2012-07-03 DIAGNOSIS — D649 Anemia, unspecified: Secondary | ICD-10-CM | POA: Diagnosis not present

## 2012-07-03 DIAGNOSIS — J9383 Other pneumothorax: Secondary | ICD-10-CM | POA: Diagnosis not present

## 2012-07-03 DIAGNOSIS — IMO0001 Reserved for inherently not codable concepts without codable children: Secondary | ICD-10-CM | POA: Diagnosis not present

## 2012-07-03 DIAGNOSIS — I2510 Atherosclerotic heart disease of native coronary artery without angina pectoris: Secondary | ICD-10-CM | POA: Diagnosis not present

## 2012-07-05 DIAGNOSIS — D649 Anemia, unspecified: Secondary | ICD-10-CM | POA: Diagnosis not present

## 2012-07-05 DIAGNOSIS — I2510 Atherosclerotic heart disease of native coronary artery without angina pectoris: Secondary | ICD-10-CM | POA: Diagnosis not present

## 2012-07-05 DIAGNOSIS — J9383 Other pneumothorax: Secondary | ICD-10-CM | POA: Diagnosis not present

## 2012-07-05 DIAGNOSIS — IMO0001 Reserved for inherently not codable concepts without codable children: Secondary | ICD-10-CM | POA: Diagnosis not present

## 2012-07-05 DIAGNOSIS — I10 Essential (primary) hypertension: Secondary | ICD-10-CM | POA: Diagnosis not present

## 2012-07-10 DIAGNOSIS — I10 Essential (primary) hypertension: Secondary | ICD-10-CM | POA: Diagnosis not present

## 2012-07-10 DIAGNOSIS — I2510 Atherosclerotic heart disease of native coronary artery without angina pectoris: Secondary | ICD-10-CM | POA: Diagnosis not present

## 2012-07-10 DIAGNOSIS — IMO0001 Reserved for inherently not codable concepts without codable children: Secondary | ICD-10-CM | POA: Diagnosis not present

## 2012-07-10 DIAGNOSIS — J9383 Other pneumothorax: Secondary | ICD-10-CM | POA: Diagnosis not present

## 2012-07-10 DIAGNOSIS — D649 Anemia, unspecified: Secondary | ICD-10-CM | POA: Diagnosis not present

## 2012-07-13 DIAGNOSIS — S2249XA Multiple fractures of ribs, unspecified side, initial encounter for closed fracture: Secondary | ICD-10-CM | POA: Diagnosis not present

## 2012-07-13 DIAGNOSIS — N390 Urinary tract infection, site not specified: Secondary | ICD-10-CM | POA: Diagnosis not present

## 2012-07-25 DIAGNOSIS — IMO0001 Reserved for inherently not codable concepts without codable children: Secondary | ICD-10-CM | POA: Diagnosis not present

## 2012-07-25 DIAGNOSIS — J9383 Other pneumothorax: Secondary | ICD-10-CM | POA: Diagnosis not present

## 2012-07-25 DIAGNOSIS — D649 Anemia, unspecified: Secondary | ICD-10-CM | POA: Diagnosis not present

## 2012-07-25 DIAGNOSIS — I2510 Atherosclerotic heart disease of native coronary artery without angina pectoris: Secondary | ICD-10-CM | POA: Diagnosis not present

## 2012-07-25 DIAGNOSIS — I10 Essential (primary) hypertension: Secondary | ICD-10-CM | POA: Diagnosis not present

## 2012-08-14 DIAGNOSIS — Z961 Presence of intraocular lens: Secondary | ICD-10-CM | POA: Diagnosis not present

## 2012-08-14 DIAGNOSIS — H2510 Age-related nuclear cataract, unspecified eye: Secondary | ICD-10-CM | POA: Diagnosis not present

## 2012-09-03 DIAGNOSIS — E785 Hyperlipidemia, unspecified: Secondary | ICD-10-CM | POA: Diagnosis not present

## 2012-09-10 DIAGNOSIS — E785 Hyperlipidemia, unspecified: Secondary | ICD-10-CM | POA: Diagnosis not present

## 2012-09-10 DIAGNOSIS — I10 Essential (primary) hypertension: Secondary | ICD-10-CM | POA: Diagnosis not present

## 2012-11-15 DIAGNOSIS — Z23 Encounter for immunization: Secondary | ICD-10-CM | POA: Diagnosis not present

## 2013-01-15 DIAGNOSIS — I10 Essential (primary) hypertension: Secondary | ICD-10-CM | POA: Diagnosis not present

## 2013-01-15 DIAGNOSIS — F411 Generalized anxiety disorder: Secondary | ICD-10-CM | POA: Diagnosis not present

## 2013-01-29 DIAGNOSIS — M76899 Other specified enthesopathies of unspecified lower limb, excluding foot: Secondary | ICD-10-CM | POA: Diagnosis not present

## 2013-04-19 DIAGNOSIS — N390 Urinary tract infection, site not specified: Secondary | ICD-10-CM | POA: Diagnosis not present

## 2013-04-19 DIAGNOSIS — J209 Acute bronchitis, unspecified: Secondary | ICD-10-CM | POA: Diagnosis not present

## 2013-04-29 DIAGNOSIS — N390 Urinary tract infection, site not specified: Secondary | ICD-10-CM | POA: Diagnosis not present

## 2013-05-01 ENCOUNTER — Other Ambulatory Visit (HOSPITAL_COMMUNITY): Payer: Self-pay | Admitting: Internal Medicine

## 2013-05-01 ENCOUNTER — Other Ambulatory Visit: Payer: Self-pay

## 2013-05-01 DIAGNOSIS — Z1231 Encounter for screening mammogram for malignant neoplasm of breast: Secondary | ICD-10-CM

## 2013-05-02 ENCOUNTER — Ambulatory Visit (HOSPITAL_COMMUNITY)
Admission: RE | Admit: 2013-05-02 | Discharge: 2013-05-02 | Disposition: A | Discharge status: Home / Self Care | Payer: Medicare Other | Source: Ambulatory Visit | Attending: Internal Medicine | Admitting: Internal Medicine

## 2013-05-02 DIAGNOSIS — Z1231 Encounter for screening mammogram for malignant neoplasm of breast: Secondary | ICD-10-CM | POA: Diagnosis not present

## 2013-05-06 ENCOUNTER — Encounter (HOSPITAL_COMMUNITY): Payer: Medicare Other

## 2013-05-06 ENCOUNTER — Other Ambulatory Visit (HOSPITAL_COMMUNITY): Payer: Self-pay | Admitting: Cardiology

## 2013-05-06 DIAGNOSIS — I6529 Occlusion and stenosis of unspecified carotid artery: Secondary | ICD-10-CM

## 2013-05-07 ENCOUNTER — Ambulatory Visit (HOSPITAL_COMMUNITY): Payer: Medicare Other | Attending: Cardiology | Admitting: Cardiology

## 2013-05-07 ENCOUNTER — Encounter: Payer: Self-pay | Admitting: Cardiology

## 2013-05-07 DIAGNOSIS — H53129 Transient visual loss, unspecified eye: Secondary | ICD-10-CM

## 2013-05-07 DIAGNOSIS — I6529 Occlusion and stenosis of unspecified carotid artery: Secondary | ICD-10-CM | POA: Insufficient documentation

## 2013-05-07 DIAGNOSIS — R42 Dizziness and giddiness: Secondary | ICD-10-CM | POA: Insufficient documentation

## 2013-05-07 NOTE — Progress Notes (Signed)
Carotid duplex complete 

## 2013-05-10 ENCOUNTER — Encounter: Payer: Self-pay | Admitting: Cardiology

## 2013-05-14 DIAGNOSIS — E785 Hyperlipidemia, unspecified: Secondary | ICD-10-CM | POA: Diagnosis not present

## 2013-05-14 DIAGNOSIS — D649 Anemia, unspecified: Secondary | ICD-10-CM | POA: Diagnosis not present

## 2013-05-14 DIAGNOSIS — Z79899 Other long term (current) drug therapy: Secondary | ICD-10-CM | POA: Diagnosis not present

## 2013-05-21 DIAGNOSIS — D649 Anemia, unspecified: Secondary | ICD-10-CM | POA: Diagnosis not present

## 2013-05-21 DIAGNOSIS — I10 Essential (primary) hypertension: Secondary | ICD-10-CM | POA: Diagnosis not present

## 2013-05-21 DIAGNOSIS — E785 Hyperlipidemia, unspecified: Secondary | ICD-10-CM | POA: Diagnosis not present

## 2013-06-10 ENCOUNTER — Institutional Professional Consult (permissible substitution): Payer: Medicare Other | Admitting: Cardiology

## 2013-06-12 ENCOUNTER — Encounter: Payer: Medicare Other | Admitting: Cardiology

## 2013-07-09 DIAGNOSIS — M25559 Pain in unspecified hip: Secondary | ICD-10-CM | POA: Diagnosis not present

## 2013-07-09 DIAGNOSIS — M76899 Other specified enthesopathies of unspecified lower limb, excluding foot: Secondary | ICD-10-CM | POA: Diagnosis not present

## 2013-08-19 DIAGNOSIS — Z79899 Other long term (current) drug therapy: Secondary | ICD-10-CM | POA: Diagnosis not present

## 2013-08-19 DIAGNOSIS — D649 Anemia, unspecified: Secondary | ICD-10-CM | POA: Diagnosis not present

## 2013-08-27 DIAGNOSIS — I10 Essential (primary) hypertension: Secondary | ICD-10-CM | POA: Diagnosis not present

## 2013-08-27 DIAGNOSIS — D649 Anemia, unspecified: Secondary | ICD-10-CM | POA: Diagnosis not present

## 2013-09-10 DIAGNOSIS — H35379 Puckering of macula, unspecified eye: Secondary | ICD-10-CM | POA: Diagnosis not present

## 2013-09-10 DIAGNOSIS — H2510 Age-related nuclear cataract, unspecified eye: Secondary | ICD-10-CM | POA: Diagnosis not present

## 2013-09-10 DIAGNOSIS — Z961 Presence of intraocular lens: Secondary | ICD-10-CM | POA: Diagnosis not present

## 2013-10-29 DIAGNOSIS — M25559 Pain in unspecified hip: Secondary | ICD-10-CM | POA: Diagnosis not present

## 2013-10-29 DIAGNOSIS — M76899 Other specified enthesopathies of unspecified lower limb, excluding foot: Secondary | ICD-10-CM | POA: Diagnosis not present

## 2013-10-29 DIAGNOSIS — M719 Bursopathy, unspecified: Secondary | ICD-10-CM | POA: Diagnosis not present

## 2013-10-29 DIAGNOSIS — M67919 Unspecified disorder of synovium and tendon, unspecified shoulder: Secondary | ICD-10-CM | POA: Diagnosis not present

## 2013-11-23 ENCOUNTER — Emergency Department (HOSPITAL_COMMUNITY): Payer: Medicare Other

## 2013-11-23 ENCOUNTER — Emergency Department (HOSPITAL_COMMUNITY)
Admission: EM | Admit: 2013-11-23 | Discharge: 2013-11-23 | Disposition: A | Discharge status: Home / Self Care | Payer: Medicare Other | Attending: Emergency Medicine | Admitting: Emergency Medicine

## 2013-11-23 ENCOUNTER — Encounter (HOSPITAL_COMMUNITY): Payer: Self-pay | Admitting: Emergency Medicine

## 2013-11-23 DIAGNOSIS — Z86010 Personal history of colonic polyps: Secondary | ICD-10-CM | POA: Insufficient documentation

## 2013-11-23 DIAGNOSIS — R42 Dizziness and giddiness: Secondary | ICD-10-CM | POA: Diagnosis not present

## 2013-11-23 DIAGNOSIS — Z862 Personal history of diseases of the blood and blood-forming organs and certain disorders involving the immune mechanism: Secondary | ICD-10-CM | POA: Insufficient documentation

## 2013-11-23 DIAGNOSIS — I10 Essential (primary) hypertension: Secondary | ICD-10-CM | POA: Diagnosis not present

## 2013-11-23 DIAGNOSIS — Z7982 Long term (current) use of aspirin: Secondary | ICD-10-CM | POA: Insufficient documentation

## 2013-11-23 DIAGNOSIS — I2510 Atherosclerotic heart disease of native coronary artery without angina pectoris: Secondary | ICD-10-CM | POA: Insufficient documentation

## 2013-11-23 DIAGNOSIS — S0990XA Unspecified injury of head, initial encounter: Secondary | ICD-10-CM | POA: Diagnosis not present

## 2013-11-23 DIAGNOSIS — Z79899 Other long term (current) drug therapy: Secondary | ICD-10-CM | POA: Diagnosis not present

## 2013-11-23 DIAGNOSIS — I509 Heart failure, unspecified: Secondary | ICD-10-CM | POA: Insufficient documentation

## 2013-11-23 DIAGNOSIS — R55 Syncope and collapse: Secondary | ICD-10-CM | POA: Diagnosis present

## 2013-11-23 DIAGNOSIS — E785 Hyperlipidemia, unspecified: Secondary | ICD-10-CM | POA: Insufficient documentation

## 2013-11-23 LAB — URINALYSIS, ROUTINE W REFLEX MICROSCOPIC
Bilirubin Urine: NEGATIVE
GLUCOSE, UA: NEGATIVE mg/dL
HGB URINE DIPSTICK: NEGATIVE
Ketones, ur: NEGATIVE mg/dL
Nitrite: NEGATIVE
PROTEIN: NEGATIVE mg/dL
Specific Gravity, Urine: 1.006 (ref 1.005–1.030)
Urobilinogen, UA: 0.2 mg/dL (ref 0.0–1.0)
pH: 7 (ref 5.0–8.0)

## 2013-11-23 LAB — CBC
HCT: 35.7 % — ABNORMAL LOW (ref 36.0–46.0)
HEMOGLOBIN: 11.6 g/dL — AB (ref 12.0–15.0)
MCH: 31.3 pg (ref 26.0–34.0)
MCHC: 32.5 g/dL (ref 30.0–36.0)
MCV: 96.2 fL (ref 78.0–100.0)
Platelets: 334 10*3/uL (ref 150–400)
RBC: 3.71 MIL/uL — ABNORMAL LOW (ref 3.87–5.11)
RDW: 12.5 % (ref 11.5–15.5)
WBC: 7.6 10*3/uL (ref 4.0–10.5)

## 2013-11-23 LAB — BASIC METABOLIC PANEL
Anion gap: 14 (ref 5–15)
BUN: 33 mg/dL — ABNORMAL HIGH (ref 6–23)
CO2: 26 mEq/L (ref 19–32)
Calcium: 9.5 mg/dL (ref 8.4–10.5)
Chloride: 99 mEq/L (ref 96–112)
Creatinine, Ser: 0.87 mg/dL (ref 0.50–1.10)
GFR calc Af Amer: 74 mL/min — ABNORMAL LOW (ref >90)
GFR, EST NON AFRICAN AMERICAN: 64 mL/min — AB (ref >90)
GLUCOSE: 97 mg/dL (ref 70–99)
Potassium: 4.6 mEq/L (ref 3.7–5.3)
SODIUM: 139 meq/L (ref 137–147)

## 2013-11-23 LAB — URINE MICROSCOPIC-ADD ON

## 2013-11-23 MED ORDER — SODIUM CHLORIDE 0.9 % IV BOLUS (SEPSIS)
500.0000 mL | Freq: Once | INTRAVENOUS | Status: AC
  Administered 2013-11-23: 500 mL via INTRAVENOUS

## 2013-11-23 MED ORDER — MECLIZINE HCL 12.5 MG PO TABS: 12.5000 mg | ORAL_TABLET | Freq: Three times a day (TID) | ORAL | Status: DC | PRN

## 2013-11-23 NOTE — ED Provider Notes (Signed)
CSN: [id number]     Arrival date & time 11/23/13  1303 History   First MD Initiated Contact with Patient 11/23/13 1653     Chief Complaint  Patient presents with  . Loss of Consciousness   Patient is a 73 y.o. female presenting with near-syncope. The history is provided by the patient.  Near Syncope This is a new problem. The current episode started today. The problem occurs rarely. The problem has been resolved. Pertinent negatives include no abdominal pain, chest pain, fever, headaches, nausea, neck pain, rash, sore throat, vomiting or weakness. The symptoms are aggravated by bending. She has tried nothing for the symptoms. The treatment provided no relief.   Pt is a 73 yo F with a PMH of HTN, HLD, MVP, CHF and anemia who presents after multiple pre-syncopal events. She describes these episodes as a sensation of falling forward each time she bends over. She has not had a full syncopal event nor has she hit her head. She denies fevers, chest pain, shortness of breath, bloody stools, recent travel. She denies room spinning sensation. She denies any tick bites or rash. She denies tinnitus. She has not had any recent medication changes.   Past Medical History  Diagnosis Date  . Hypertension   . Hyperlipidemia   . Mitral valve prolapse     Mitral valve prolapse on echocardiogram in 2004 along with RVH and LVH  . Arteriosclerotic cardiovascular disease (ASCVD)     Cardiac catheterization in 2000: Report is unavailable-chart notes indicate insignificant coronary artery disease  . Colonic polyp     X3 in 2011 along with an ulceration of the cecum  . Anemia, normocytic normochromic 01/2012    H&H-10.6/32; low normal iron, normal ferritin, saturation-16%  . CHF (congestive heart failure)    Past Surgical History  Procedure Laterality Date  . Cholecystectomy    . Abdominal hysterectomy    . Colonoscopy  2005    Negative screening study  . Colonoscopy w/ polypectomy  2011    Cecal ulcer;  tubular adenoma x3   Family History  Problem Relation Age of Onset  . Heart attack Mother 79    heart problems  . Cancer      FH  . Diabetes      FH  . Hypertension      FH  . Cancer Mother     uterus  . Pancreatitis Sister     died  age 53  . Diabetes Son   . Diabetes Daughter    History  Substance Use Topics  . Smoking status: Former Smoker    Quit date: 01/31/1987  . Smokeless tobacco: Never Used     Comment: quit 20 + yrs ago  . Alcohol Use: Yes     Comment: occasionally   OB History   Grav Para Term Preterm Abortions TAB SAB Ect Mult Living                 Review of Systems  Constitutional: Negative for fever.  HENT: Negative for rhinorrhea and sore throat.   Eyes: Negative for visual disturbance.  Respiratory: Negative for chest tightness and shortness of breath.   Cardiovascular: Positive for near-syncope. Negative for chest pain and palpitations.  Gastrointestinal: Negative for nausea, vomiting, abdominal pain and constipation.  Genitourinary: Negative for dysuria and hematuria.  Musculoskeletal: Negative for back pain and neck pain.  Skin: Negative for rash.  Neurological: Positive for light-headedness. Negative for dizziness, tremors, seizures, syncope, weakness and headaches.  Imbalance, sensation of falling forward with bending  Psychiatric/Behavioral: Negative for confusion.  All other systems reviewed and are negative.  Allergies  Statins  Home Medications   Prior to Admission medications   Medication Sig Start Date End Date Taking? Authorizing Provider  ALPRAZolam Duanne Moron) 0.5 MG tablet Take 0.5 mg by mouth 2 (two) times daily as needed. Anxiety/sleep aid   Yes Historical Provider, MD  amLODipine-olmesartan (AZOR) 5-40 MG per tablet Take 1 tablet by mouth at bedtime.    Yes Historical Provider, MD  aspirin 81 MG tablet Take 81 mg by mouth at bedtime.    Yes Historical Provider, MD  chlorthalidone (HYGROTON) 25 MG tablet Take 25 mg by mouth  daily.     Yes Historical Provider, MD  citalopram (CELEXA) 10 MG tablet Take 10 mg by mouth daily.   Yes Historical Provider, MD  Cyanocobalamin (VITAMIN B-12 IJ) Inject 1 mL as directed every 30 (thirty) days.   Yes Historical Provider, MD  metoprolol succinate (TOPROL-XL) 25 MG 24 hr tablet Take 25 mg by mouth daily.     Yes Historical Provider, MD  Multiple Vitamin (MULTIVITAMIN WITH MINERALS) TABS Take 1 tablet by mouth daily.   Yes Historical Provider, MD  pravastatin (PRAVACHOL) 40 MG tablet Take 40 mg by mouth daily.    Yes Historical Provider, MD  meclizine (ANTIVERT) 12.5 MG tablet Take 1 tablet (12.5 mg total) by mouth 3 (three) times daily as needed for dizziness or nausea. 11/23/13   Gustavus Bryant, MD   BP 159/50  Pulse 73  Temp(Src) 98.8 F (37.1 C)  Resp 18  Ht 5' (1.524 m)  Wt 137 lb (62.143 kg)  BMI 26.76 kg/m2  SpO2 97% Physical Exam  Constitutional: She is oriented to person, place, and time. She appears well-developed and well-nourished. No distress.  HENT:  Head: Normocephalic and atraumatic.  Mouth/Throat: Oropharynx is clear and moist.  Eyes: EOM are normal. Pupils are equal, round, and reactive to light.  Neck: Neck supple. No JVD present.  Cardiovascular: Normal rate, regular rhythm, normal heart sounds and intact distal pulses.  Exam reveals no gallop.   No murmur heard. Pulmonary/Chest: Effort normal and breath sounds normal. She has no wheezes. She has no rales.  Abdominal: Soft. She exhibits no distension. There is no tenderness.  Musculoskeletal: Normal range of motion. She exhibits no tenderness.  Neurological: She is alert and oriented to person, place, and time. She displays no tremor. No cranial nerve deficit or sensory deficit. She exhibits normal muscle tone. She displays a negative Romberg sign. Gait abnormal. GCS eye subscore is 4. GCS verbal subscore is 5. GCS motor subscore is 6. She displays no Babinski's sign on the right side. She displays no  Babinski's sign on the left side.  Reflex Scores:      Tricep reflexes are 2+ on the right side and 2+ on the left side.      Bicep reflexes are 2+ on the right side and 2+ on the left side.      Brachioradialis reflexes are 2+ on the right side and 2+ on the left side.      Patellar reflexes are 2+ on the right side and 2+ on the left side.      Achilles reflexes are 2+ on the right side and 2+ on the left side. 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 Negative pronator drift. Imbalance with heel-to-toe walking.  Skin: Skin is warm and dry. No rash noted.  Psychiatric:  Her behavior is normal.    ED Course  Procedures  None  Labs Review Labs Reviewed  CBC - Abnormal; Notable for the following:    RBC 3.71 (*)    Hemoglobin 11.6 (*)    HCT 35.7 (*)    All other components within normal limits  BASIC METABOLIC PANEL - Abnormal; Notable for the following:    BUN 33 (*)    GFR calc non Af Amer 64 (*)    GFR calc Af Amer 74 (*)    All other components within normal limits  URINALYSIS, ROUTINE W REFLEX MICROSCOPIC - Abnormal; Notable for the following:    Leukocytes, UA MODERATE (*)    All other components within normal limits  URINE MICROSCOPIC-ADD ON    Imaging Review Mr Brain Wo Contrast  11/23/2013   CLINICAL DATA:  73 year old female with dizziness resulting in 3 recent falls. Suspicion of cerebellar infarct. Initial encounter.  EXAM: MRI HEAD WITHOUT CONTRAST  TECHNIQUE: Multiplanar, multiecho pulse sequences of the brain and surrounding structures were obtained without intravenous contrast.  COMPARISON:  Head CT without contrast 09/02/2010.  FINDINGS: Cerebral volume is normal. Each No restricted diffusion to suggest acute infarction. No midline shift, mass effect, evidence of mass lesion, ventriculomegaly, extra-axial collection or acute intracranial hemorrhage. Cervicomedullary junction and pituitary are within normal limits. Negative visualized cervical spine. Major  intracranial vascular flow voids are preserved.  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 Elsewhere minimal to mild for age nonspecific white matter T2 and FLAIR hyperintensity. Deep gray matter nuclei, brainstem and cerebellum are within normal limits for age. Visible internal auditory structures appear normal. Mastoids are clear.  Postoperative changes to the left globe. Paranasal sinuses are clear. Visualized scalp soft tissues are within normal limits. Normal bone marrow signal.  IMPRESSION: 1.  No acute intracranial abnormality. 2. Mild for age chronic ischemic disease.   Electronically Signed   By: Lars Pinks M.D.   On: 11/23/2013 18:44   MDM   Final diagnoses:  Disequilibrium    73 year old white female presents with symptoms of disequilibrium. She is well appearing on exam. Afebrile, vital stable. Neurologic exam significant for truncal ataxia, positive Romberg, imbalance with heel-to-toe walking. MRI brain was obtained and was negative for acute pathology. Labs are unremarkable. Patient given 500 cc NS bolus. Given Rx for meclizine for vertiginous symptoms. Instructed to f/u with PCP in 1-2 weeks if symptoms persist. Pt voices understanding and is agreeable with plan. Stable at discharge.   Case discussed with Dr. Audie Pinto.   Gustavus Bryant, MD 11/23/13 [place], MD 11/23/13 2342

## 2013-11-23 NOTE — ED Notes (Signed)
Pt reports that she has had 3 syncopal episodes over the past couple of days. States she had an episode about 1100am reports that afterwards she feels weak. Denies any chest pain, sob or n/v. Also reports a headache

## 2013-11-23 NOTE — ED Notes (Signed)
Pt off the floor

## 2013-11-23 NOTE — ED Notes (Signed)
Patient transported to MRI 

## 2013-11-26 DIAGNOSIS — I951 Orthostatic hypotension: Secondary | ICD-10-CM | POA: Diagnosis not present

## 2013-11-26 DIAGNOSIS — Z23 Encounter for immunization: Secondary | ICD-10-CM | POA: Diagnosis not present

## 2013-11-28 NOTE — ED Provider Notes (Signed)
I saw and evaluated the patient, reviewed the resident's note and I agree with the findings and plan.   .Face to face Exam:  General:  Awake HEENT:  Atraumatic Resp:  Normal effort Abd:  Nondistended Neuro:No focal weakness     Dot Lanes, MD 11/28/13 1616

## 2013-12-03 DIAGNOSIS — R55 Syncope and collapse: Secondary | ICD-10-CM | POA: Diagnosis not present

## 2014-01-14 DIAGNOSIS — M170 Bilateral primary osteoarthritis of knee: Secondary | ICD-10-CM | POA: Diagnosis not present

## 2014-03-06 DIAGNOSIS — N390 Urinary tract infection, site not specified: Secondary | ICD-10-CM | POA: Diagnosis not present

## 2014-03-06 DIAGNOSIS — I10 Essential (primary) hypertension: Secondary | ICD-10-CM | POA: Diagnosis not present

## 2014-03-06 DIAGNOSIS — N3000 Acute cystitis without hematuria: Secondary | ICD-10-CM | POA: Diagnosis not present

## 2014-03-06 DIAGNOSIS — F419 Anxiety disorder, unspecified: Secondary | ICD-10-CM | POA: Diagnosis not present

## 2014-03-25 ENCOUNTER — Encounter (INDEPENDENT_AMBULATORY_CARE_PROVIDER_SITE_OTHER): Payer: Self-pay | Admitting: Internal Medicine

## 2014-03-25 ENCOUNTER — Other Ambulatory Visit (INDEPENDENT_AMBULATORY_CARE_PROVIDER_SITE_OTHER): Payer: Self-pay | Admitting: *Deleted

## 2014-03-25 ENCOUNTER — Ambulatory Visit (INDEPENDENT_AMBULATORY_CARE_PROVIDER_SITE_OTHER): Payer: Medicare Other | Admitting: Internal Medicine

## 2014-03-25 ENCOUNTER — Telehealth (INDEPENDENT_AMBULATORY_CARE_PROVIDER_SITE_OTHER): Payer: Self-pay | Admitting: *Deleted

## 2014-03-25 VITALS — BP 128/68 | HR 72 | Temp 98.0°F | Ht 60.0 in | Wt 146.4 lb

## 2014-03-25 DIAGNOSIS — R1030 Lower abdominal pain, unspecified: Secondary | ICD-10-CM

## 2014-03-25 DIAGNOSIS — Z800 Family history of malignant neoplasm of digestive organs: Secondary | ICD-10-CM | POA: Diagnosis not present

## 2014-03-25 DIAGNOSIS — Z1211 Encounter for screening for malignant neoplasm of colon: Secondary | ICD-10-CM

## 2014-03-25 DIAGNOSIS — R1084 Generalized abdominal pain: Secondary | ICD-10-CM

## 2014-03-25 MED ORDER — PEG-KCL-NACL-NASULF-NA ASC-C 100 G PO SOLR: 1.0000 | Freq: Once | ORAL | Status: DC

## 2014-03-25 NOTE — Progress Notes (Addendum)
Subjective:    Patient ID: Mackenzie [name], female    DOB: 30-Mar-1940, 74 y.o.   MRN: [id number]  HPI Presents today with c/o lower abdominal pain. She says something does not feel right. Lower abdominal pain for about a month. She says it feels like a menstrual cramp. Her BMs are normal. Stools are normal size.  She did see some blood on the toilet tissue 2-3 weeks ago. Denies any constipation. She has the cramps about every day. Cramps are not related to her BMs.  No melena. Appetite is good. No weight loss.  She takes one Baby ASA daily.  Her last colonoscopy was in 2011 with a tubular adenoma. Family hx of colon cancer in Mother's sisters.  Last colonoscopy in 2011 with tubular adenomas    12/16/2009 Colonoscopy: High risk screening. Mother has a hx of uterine cancer. Mother's sister had colon cancer. (age 61s) Single ulcer at the ileocecal valve. Possible etiologies include non-steroidal antiinflammatory drug induced versus nonspecific or ischemic. Risk for ischemia is low.  She is on a low dose ASA but also uses OTC non-steroidal antiinflammatory drugs no more than once or twice a week.  Three small polyps ablated via cold biopsy. Biospy: Tubular adenoma. Next colonoscopy in 5 yrs.  Review of Systems Widowed, Two children. Both are diabetic.    Past Medical History  Diagnosis Date  . Hypertension   . Hyperlipidemia   . Mitral valve prolapse     Mitral valve prolapse on echocardiogram in 2004 along with RVH and LVH  . Arteriosclerotic cardiovascular disease (ASCVD)     Cardiac catheterization in 2000: Report is unavailable-chart notes indicate insignificant coronary artery disease  . Colonic polyp     X3 in 2011 along with an ulceration of the cecum  . Anemia, normocytic normochromic 01/2012    H&H-10.6/32; low normal iron, normal ferritin, saturation-16%  . CHF (congestive heart failure)     Past Surgical History  Procedure Laterality Date  . Cholecystectomy    . Abdominal  hysterectomy    . Colonoscopy  2005    Negative screening study  . Colonoscopy w/ polypectomy  2011    Cecal ulcer; tubular adenoma x3    Allergies  Allergen Reactions  . Statins Other (See Comments)    Atorvastatin, pravastatin, simvastatin->myalgias     Current Outpatient Prescriptions on File Prior to Visit  Medication Sig Dispense Refill  . ALPRAZolam (XANAX) 0.5 MG tablet Take 0.5 mg by mouth 2 (two) times daily as needed. Anxiety/sleep aid    . amLODipine-olmesartan (AZOR) 5-40 MG per tablet Take 1 tablet by mouth at bedtime.     Marland Kitchen aspirin 81 MG tablet Take 81 mg by mouth at bedtime.     . citalopram (CELEXA) 10 MG tablet Take 10 mg by mouth daily.    . Cyanocobalamin (VITAMIN B-12 IJ) Inject 1 mL as directed every 30 (thirty) days.    . meclizine (ANTIVERT) 12.5 MG tablet Take 1 tablet (12.5 mg total) by mouth 3 (three) times daily as needed for dizziness or nausea. 30 tablet 0  . metoprolol succinate (TOPROL-XL) 25 MG 24 hr tablet Take 25 mg by mouth daily.      . Multiple Vitamin (MULTIVITAMIN WITH MINERALS) TABS Take 1 tablet by mouth daily.    . pravastatin (PRAVACHOL) 40 MG tablet Take 40 mg by mouth as needed.      No current facility-administered medications on file prior to visit.        Objective:  Physical Exam  Filed Vitals:   03/25/14 1426  Height: 5' (1.524 m)  Weight: 146 lb 6.4 oz (66.407 kg)   Alert and oriented. Skin warm and dry. Oral mucosa is moist.   . Sclera anicteric, conjunctivae is pink. Thyroid not enlarged. No cervical lymphadenopathy. Lungs clear. Heart regular rate and rhythm.  Abdomen is soft. Bowel sounds are positive. No hepatomegaly. No abdominal masses felt. No tenderness.  No edema to lower extremities.          Assessment & Plan:  Lower abdominal cramps. Patient is concerned about family hx of colon cancer. Colonic carcinoma needs to be ruled out. Colonic ulcer also needs to e ruled out.  Will schedule a colonoscopy. (Patient  is due this year for a high risk colonoscopy).

## 2014-03-25 NOTE — Telephone Encounter (Signed)
Patient needs movi prep 

## 2014-03-25 NOTE — Patient Instructions (Signed)
Colonoscopy.  The risks and benefits such as perforation, bleeding, and infection were reviewed with the patient and is agreeable. 

## 2014-04-04 ENCOUNTER — Encounter (HOSPITAL_COMMUNITY): Payer: Self-pay | Admitting: *Deleted

## 2014-04-04 ENCOUNTER — Encounter (HOSPITAL_COMMUNITY)
Admission: RE | Disposition: A | Discharge status: Home / Self Care | Payer: Self-pay | Source: Ambulatory Visit | Attending: Internal Medicine

## 2014-04-04 ENCOUNTER — Ambulatory Visit (HOSPITAL_COMMUNITY)
Admission: RE | Admit: 2014-04-04 | Discharge: 2014-04-04 | Disposition: A | Discharge status: Home / Self Care | Payer: Medicare Other | Source: Ambulatory Visit | Attending: Internal Medicine | Admitting: Internal Medicine

## 2014-04-04 DIAGNOSIS — F329 Major depressive disorder, single episode, unspecified: Secondary | ICD-10-CM | POA: Insufficient documentation

## 2014-04-04 DIAGNOSIS — K644 Residual hemorrhoidal skin tags: Secondary | ICD-10-CM | POA: Diagnosis not present

## 2014-04-04 DIAGNOSIS — Z800 Family history of malignant neoplasm of digestive organs: Secondary | ICD-10-CM

## 2014-04-04 DIAGNOSIS — I2510 Atherosclerotic heart disease of native coronary artery without angina pectoris: Secondary | ICD-10-CM | POA: Insufficient documentation

## 2014-04-04 DIAGNOSIS — E785 Hyperlipidemia, unspecified: Secondary | ICD-10-CM | POA: Insufficient documentation

## 2014-04-04 DIAGNOSIS — D123 Benign neoplasm of transverse colon: Secondary | ICD-10-CM

## 2014-04-04 DIAGNOSIS — R1084 Generalized abdominal pain: Secondary | ICD-10-CM

## 2014-04-04 DIAGNOSIS — D122 Benign neoplasm of ascending colon: Secondary | ICD-10-CM

## 2014-04-04 DIAGNOSIS — Z79899 Other long term (current) drug therapy: Secondary | ICD-10-CM | POA: Insufficient documentation

## 2014-04-04 DIAGNOSIS — Z87891 Personal history of nicotine dependence: Secondary | ICD-10-CM | POA: Diagnosis not present

## 2014-04-04 DIAGNOSIS — K5730 Diverticulosis of large intestine without perforation or abscess without bleeding: Secondary | ICD-10-CM | POA: Insufficient documentation

## 2014-04-04 DIAGNOSIS — Z86010 Personal history of colonic polyps: Secondary | ICD-10-CM | POA: Insufficient documentation

## 2014-04-04 DIAGNOSIS — I10 Essential (primary) hypertension: Secondary | ICD-10-CM | POA: Insufficient documentation

## 2014-04-04 DIAGNOSIS — K648 Other hemorrhoids: Secondary | ICD-10-CM

## 2014-04-04 DIAGNOSIS — R1030 Lower abdominal pain, unspecified: Secondary | ICD-10-CM | POA: Diagnosis present

## 2014-04-04 HISTORY — DX: Major depressive disorder, single episode, unspecified: F32.9

## 2014-04-04 HISTORY — PX: COLONOSCOPY: SHX5424

## 2014-04-04 HISTORY — DX: Depression, unspecified: F32.A

## 2014-04-04 SURGERY — COLONOSCOPY
Anesthesia: Moderate Sedation

## 2014-04-04 MED ORDER — MEPERIDINE HCL 50 MG/ML IJ SOLN
INTRAMUSCULAR | Status: DC | PRN
  Administered 2014-04-04 (×2): 25 mg via INTRAVENOUS

## 2014-04-04 MED ORDER — MIDAZOLAM HCL 5 MG/5ML IJ SOLN
INTRAMUSCULAR | Status: DC | PRN
  Administered 2014-04-04: 1 mg via INTRAVENOUS
  Administered 2014-04-04 (×3): 2 mg via INTRAVENOUS
  Administered 2014-04-04: 1 mg via INTRAVENOUS
  Administered 2014-04-04: 2 mg via INTRAVENOUS

## 2014-04-04 MED ORDER — SODIUM CHLORIDE 0.9 % IV SOLN
INTRAVENOUS | Status: DC
  Administered 2014-04-04: 12:00:00 via INTRAVENOUS

## 2014-04-04 MED ORDER — BENEFIBER DRINK MIX PO PACK: 4.0000 g | PACK | Freq: Every day | ORAL | Status: DC

## 2014-04-04 MED ORDER — DICYCLOMINE HCL 10 MG PO CAPS: 10.0000 mg | ORAL_CAPSULE | Freq: Two times a day (BID) | ORAL | Status: DC | PRN

## 2014-04-04 MED ORDER — MEPERIDINE HCL 50 MG/ML IJ SOLN
INTRAMUSCULAR | Status: AC
  Filled 2014-04-04: qty 1

## 2014-04-04 MED ORDER — STERILE WATER FOR IRRIGATION IR SOLN
Status: DC | PRN
  Administered 2014-04-04: 12:00:00

## 2014-04-04 MED ORDER — MIDAZOLAM HCL 5 MG/5ML IJ SOLN
INTRAMUSCULAR | Status: AC
  Filled 2014-04-04: qty 10

## 2014-04-04 NOTE — Op Note (Signed)
COLONOSCOPY PROCEDURE REPORT  PATIENT:  Mackenzie [name]  MR#:  [id number] Birthdate:  26-Jul-1940, 74 y.o., female Endoscopist:  Dr. Rogene Houston, MD Referred By:  Dr. Asencion Noble, MD  Procedure Date: 04/04/2014  Procedure:   Colonoscopy  Indications:  Patient is 74 year old Caucasian female who presents with three-month months history of lower abdominal cramps. She was having diarrhea but has resolved. Urologic and gynecologic evaluations been negative. 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 She had 3 adenomas removed on her last colonoscopy over 4 years ago. She was also found to have large ulcerated ileocecal valve on her colonoscopy of November 2011. Family history significant for CRC in for second-degree relatives. She is undergoing colonoscopy primarily for diagnostic purposes.  Informed Consent:  The procedure and risks were reviewed with the patient and informed consent was obtained.  Medications:  Demerol 50 mg IV Versed 10 mg IV  Description of procedure:  After a digital rectal exam was performed, that colonoscope was advanced from the anus through the rectum and colon to the area of the cecum, ileocecal valve and appendiceal orifice. The cecum was deeply intubated. These structures were well-seen and photographed for the record. From the level of the cecum and ileocecal valve, the scope was slowly and cautiously withdrawn. The mucosal surfaces were carefully surveyed utilizing scope tip to flexion to facilitate fold flattening as needed. The scope was pulled down into the rectum where a thorough exam including retroflexion was performed.  Findings:   Prep excellent. 6 mm polyp cold snared from ascending colon. There was continued was from polypectomy site which did not stop with clamping a year for a few minutes. Bleeding therefore controlled with application of single resolution clip. Small polyp ablated via cold biopsy from hepatic flexure. Both of these polyps were submitted  together. Few diverticula at sigmoid colon. Normal rectal mucosa. Small hemorrhoids below the dentate line.   Therapeutic/Diagnostic Maneuvers Performed:  See above  Complications:  None  Cecal Withdrawal Time:  16  minutes  Impression:  Examination performed to cecum. 6 mm polyp cold snared from ascending colon. Single resolution clip applied to polypectomy site for hemostasis. Another small polyp ablated via cold biopsy from hepatic flexure. Both of these polyps were submitted together. Mild sigmoid colon diverticulosis. Small external hemorrhoids.  Comments; It is possible that she has developed irritable bowel syndrome.  Recommendations:  Standard instructions given. High fiber diet. Dicyclomine 10 mg by mouth twice a day when necessary. Benefiber 4 g by mouth daily at bedtime. I will contact patient with biopsy results and further recommendations.  Aquan Kope U  04/04/2014 12:59 PM  CC: Dr. Asencion Noble, MD & Dr. Rayne Du ref. provider found

## 2014-04-04 NOTE — H&P (Signed)
Mackenzie [name] is an 74 y.o. female.   Chief Complaint: Patient is here for colonoscopy. HPI: Patient is 74 year old Caucasian female who presents with three-month history of lower abdominal cramps. She was also having diarrhea but has resolved since she was taken off the fluid medication. She's had negative for urologic and gynecologic evaluation. She denies melena or frank rectal bleeding. At times she notices blood on the tissue she feels is coming from hemorrhoids. She has good appetite. She has not taken antibiotics recently. Last colonoscopy was in November 2011 with removal of 3 small polyps and these are tubular adenomas. She is also found to have ulcer at ileocecal valve and biopsy revealed benign ulceration. Family history significant for colon carcinoma in maternal uncle who was in his 33s and three maternal aunts with colon carcinoma in their 24s. She had one sister who died of fulminant pancreatitis when she was in her 36s.  Past Medical History  Diagnosis Date  . Hypertension   . Hyperlipidemia   . Mitral valve prolapse     Mitral valve prolapse on echocardiogram in 2004 along with RVH and LVH  . Arteriosclerotic cardiovascular disease (ASCVD)     Cardiac catheterization in 2000: Report is unavailable-chart notes indicate insignificant coronary artery disease  . Colonic polyp     X3 in 2011 along with an ulceration of the cecum  . Anemia, normocytic normochromic 01/2012    H&H-10.6/32; low normal iron, normal ferritin, saturation-16%  . CHF (congestive heart failure)   . Depression     Past Surgical History  Procedure Laterality Date  . Cholecystectomy    . Abdominal hysterectomy    . Colonoscopy  2005    Negative screening study  . Colonoscopy w/ polypectomy  2011    Cecal ulcer; tubular adenoma x3    Family History  Problem Relation Age of Onset  . Heart attack Mother 52    heart problems  . Cancer Mother     uterus  . Cancer      FH  . Diabetes      FH  .  Hypertension      FH  . Pancreatitis Sister     died  age 107  . Diabetes Son   . Diabetes Daughter   . Colon cancer Maternal Aunt 81  . Colon cancer Maternal Aunt 78  . Colon cancer Maternal Aunt 76  . Colon cancer Maternal Uncle 3   Social History:  reports that she quit smoking about 27 years ago. She has never used smokeless tobacco. She reports that she drinks alcohol. She reports that she does not use illicit drugs.  Allergies:  Allergies  Allergen Reactions  . Statins Other (See Comments)    Atorvastatin, pravastatin, simvastatin->myalgias     Medications Prior to Admission  Medication Sig Dispense Refill  . ALPRAZolam (XANAX) 0.5 MG tablet Take 0.5 mg by mouth 2 (two) times daily as needed. Anxiety/sleep aid    . amLODipine-olmesartan (AZOR) 5-40 MG per tablet Take 1 tablet by mouth at bedtime.     Marland Kitchen aspirin 81 MG tablet Take 81 mg by mouth at bedtime.     . citalopram (CELEXA) 10 MG tablet Take 10 mg by mouth daily.    . Cyanocobalamin (VITAMIN B-12 IJ) Inject 1 mL as directed every 30 (thirty) days.    . meclizine (ANTIVERT) 12.5 MG tablet Take 1 tablet (12.5 mg total) by mouth 3 (three) times daily as needed for dizziness or nausea. (Patient not taking:  Reported on 03/27/2014) 30 tablet 0  . metoprolol succinate (TOPROL-XL) 25 MG 24 hr tablet Take 25 mg by mouth daily.      . Multiple Vitamin (MULTIVITAMIN WITH MINERALS) TABS Take 1 tablet by mouth daily.    . peg 3350 powder (MOVIPREP) 100 G SOLR Take 1 kit (200 g total) by mouth once. 1 kit 0  . pravastatin (PRAVACHOL) 40 MG tablet Take 40 mg by mouth at bedtime.       No results found for this or any previous visit (from the past 48 hour(s)). No results found.  ROS  Blood pressure 157/71, pulse 64, temperature 98.1 F (36.7 C), temperature source Oral, resp. rate 14, SpO2 99 %. Physical Exam  Constitutional: She appears well-developed and well-nourished.  HENT:  Mouth/Throat: Oropharynx is clear and moist.   Eyes: Conjunctivae are normal. No scleral icterus.  Neck: No thyromegaly present.  Cardiovascular: Normal rate, regular rhythm and normal heart sounds.   No murmur heard. Respiratory: Effort normal and breath sounds normal.  GI: Soft. She exhibits no distension and no mass. There is no tenderness.  Musculoskeletal: She exhibits no edema.  Lymphadenopathy:    She has no cervical adenopathy.  Neurological: She is alert.  Skin: Skin is warm and dry.     Assessment/Plan Lower abdominal pain. 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 Family history of colon carcinoma(for second-degree relatives). Diagnostic colonoscopy  Mackenzie [name] U 04/04/2014, 12:04 PM

## 2014-04-04 NOTE — Discharge Instructions (Signed)
Resume usual medications. Dicyclomine 10 mg by mouth before breakfast and lunch daily as needed. High fiber diet. Benefiber 4 g by mouth daily at bedtime. Remember you cannot have an MRI until clipped as passed. No driving for 24 hours. Physician will call with biopsy results.   Colonoscopy, Care After Refer to this sheet in the next few weeks. These instructions provide you with information on caring for yourself after your procedure. Your health care provider may also give you more specific instructions. Your treatment has been planned according to current medical practices, but problems sometimes occur. Call your health care provider if you have any problems or questions after your procedure. WHAT TO EXPECT AFTER THE PROCEDURE  After your procedure, it is typical to have the following:  A small amount of blood in your stool.  Moderate amounts of gas and mild abdominal cramping or bloating. HOME CARE INSTRUCTIONS  Do not drive, operate machinery, or sign important documents for 24 hours.  You may shower and resume your regular physical activities, but move at a slower pace for the first 24 hours.  Take frequent rest periods for the first 24 hours.  Walk around or put a warm pack on your abdomen to help reduce abdominal cramping and bloating.  Drink enough fluids to keep your urine clear or pale yellow.  You may resume your normal diet as instructed by your health care provider. Avoid heavy or fried foods that are hard to digest.  Avoid drinking alcohol for 24 hours or as instructed by your health care provider.  Only take over-the-counter or prescription medicines as directed by your health care provider.  If a tissue sample (biopsy) was taken during your procedure:  Do not take aspirin or blood thinners for 7 days, or as instructed by your health care provider.  Do not drink alcohol for 7 days, or as instructed by your health care provider.  Eat soft foods for the first 24  hours. SEEK MEDICAL CARE IF: You have persistent spotting of blood in your stool 2-3 days after the procedure. SEEK IMMEDIATE MEDICAL CARE IF:  You have more than a small spotting of blood in your stool.  You pass large blood clots in your stool.  Your abdomen is swollen (distended).  You have nausea or vomiting.  You have a fever.  You have increasing abdominal pain that is not relieved with medicine. Document Released: 09/15/2003 Document Revised: 11/21/2012 Document Reviewed: 10/08/2012 [facility] Patient Information 2015 [place], [place]. This information is not intended to replace advice given to you by your health care provider. Make sure you discuss any questions you have with your health care provider.  High-Fiber Diet Fiber is found in fruits, vegetables, and grains. A high-fiber diet encourages the addition of more whole grains, legumes, fruits, and vegetables in your diet. The recommended amount of fiber for adult males is 38 g per day. For adult females, it is 25 g per day. Pregnant and lactating women should get 28 g of fiber per day. If you have a digestive or bowel problem, ask your caregiver for advice before adding high-fiber foods to your diet. Eat a variety of high-fiber foods instead of only a select few type of foods.  PURPOSE  To increase stool bulk.  To make bowel movements more regular to prevent constipation.  To lower cholesterol.  To prevent overeating. WHEN IS THIS DIET USED?  It may be used if you have constipation and hemorrhoids.  It may be used if you  have uncomplicated diverticulosis (intestine condition) and irritable bowel syndrome.  It may be used if you need help with weight management.  It may be used if you want to add it to your diet as a protective measure against atherosclerosis, diabetes, and cancer. SOURCES OF FIBER  Whole-grain breads and cereals.  Fruits, such as apples, oranges, bananas, berries, prunes, and pears.  Vegetables,  such as green peas, carrots, sweet potatoes, beets, broccoli, cabbage, spinach, and artichokes.  Legumes, such split peas, soy, lentils.  Almonds. FIBER CONTENT IN FOODS Starches and Grains / Dietary Fiber (g)  Cheerios, 1 cup / 3 g  Corn Flakes cereal, 1 cup / 0.7 g  Rice crispy treat cereal, 1 cup / 0.3 g  Instant oatmeal (cooked),  cup / 2 g  Frosted wheat cereal, 1 cup / 5.1 g  Brown, long-grain rice (cooked), 1 cup / 3.5 g  White, long-grain rice (cooked), 1 cup / 0.6 g  Enriched macaroni (cooked), 1 cup / 2.5 g Legumes / Dietary Fiber (g)  Baked beans (canned, plain, or vegetarian),  cup / 5.2 g  Kidney beans (canned),  cup / 6.8 g  Pinto beans (cooked),  cup / 5.5 g Breads and Crackers / Dietary Fiber (g)  Plain or honey graham crackers, 2 squares / 0.7 g  Saltine crackers, 3 squares / 0.3 g  Plain, salted pretzels, 10 pieces / 1.8 g  Whole-wheat bread, 1 slice / 1.9 g  White bread, 1 slice / 0.7 g  Raisin bread, 1 slice / 1.2 g  Plain bagel, 3 oz / 2 g  Flour tortilla, 1 oz / 0.9 g  Corn tortilla, 1 small / 1.5 g  Hamburger or hotdog bun, 1 small / 0.9 g Fruits / Dietary Fiber (g)  Apple with skin, 1 medium / 4.4 g  Sweetened applesauce,  cup / 1.5 g  Banana,  medium / 1.5 g  Grapes, 10 grapes / 0.4 g  Orange, 1 small / 2.3 g  Raisin, 1.5 oz / 1.6 g  Melon, 1 cup / 1.4 g Vegetables / Dietary Fiber (g)  Green beans (canned),  cup / 1.3 g  Carrots (cooked),  cup / 2.3 g  Broccoli (cooked),  cup / 2.8 g  Peas (cooked),  cup / 4.4 g  Mashed potatoes,  cup / 1.6 g  Lettuce, 1 cup / 0.5 g  Corn (canned),  cup / 1.6 g  Tomato,  cup / 1.1 g Document Released: 01/31/2005 Document Revised: 08/02/2011 Document Reviewed: 05/05/2011 [employer] Patient Information 2015 [place], [place]. This information is not intended to replace advice given to you by your health care provider. Make sure you discuss any questions you have with  your health care provider.

## 2014-04-08 ENCOUNTER — Encounter (HOSPITAL_COMMUNITY): Payer: Self-pay | Admitting: Internal Medicine

## 2014-04-15 DIAGNOSIS — M7061 Trochanteric bursitis, right hip: Secondary | ICD-10-CM | POA: Diagnosis not present

## 2014-04-21 ENCOUNTER — Encounter (INDEPENDENT_AMBULATORY_CARE_PROVIDER_SITE_OTHER): Payer: Self-pay | Admitting: *Deleted

## 2014-05-10 IMAGING — MG MM DIGITAL SCREENING BILAT
4 series · 4 of 4 positions shown · non-contrast
Comparison: Previous exams.

CLINICAL DATA: Screening.

DIGITAL BILATERAL SCREENING MAMMOGRAM WITH CAD

[L CC]
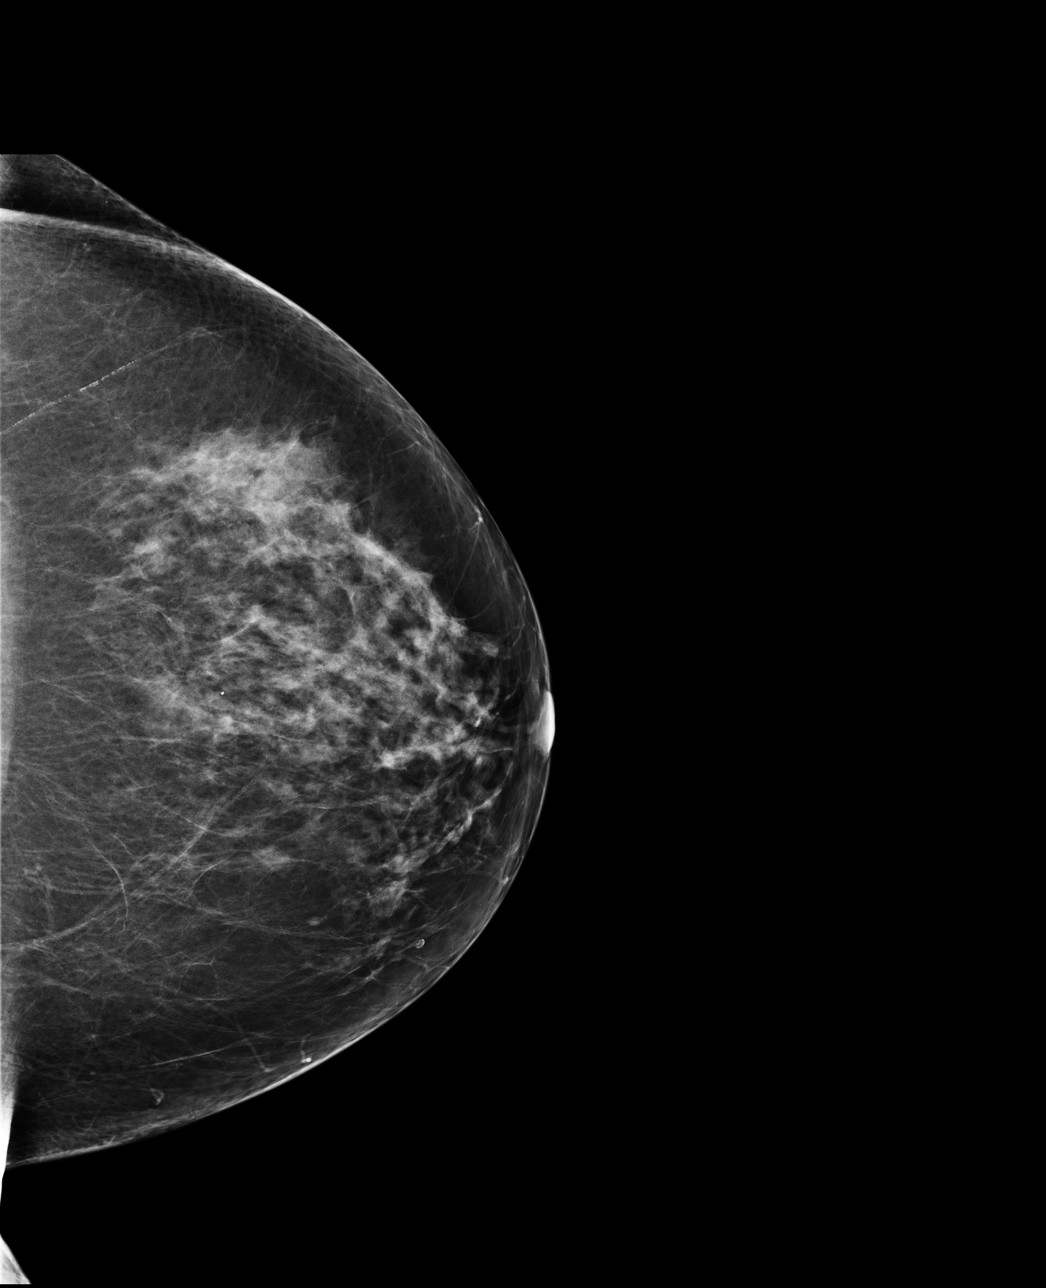

[L MLO]
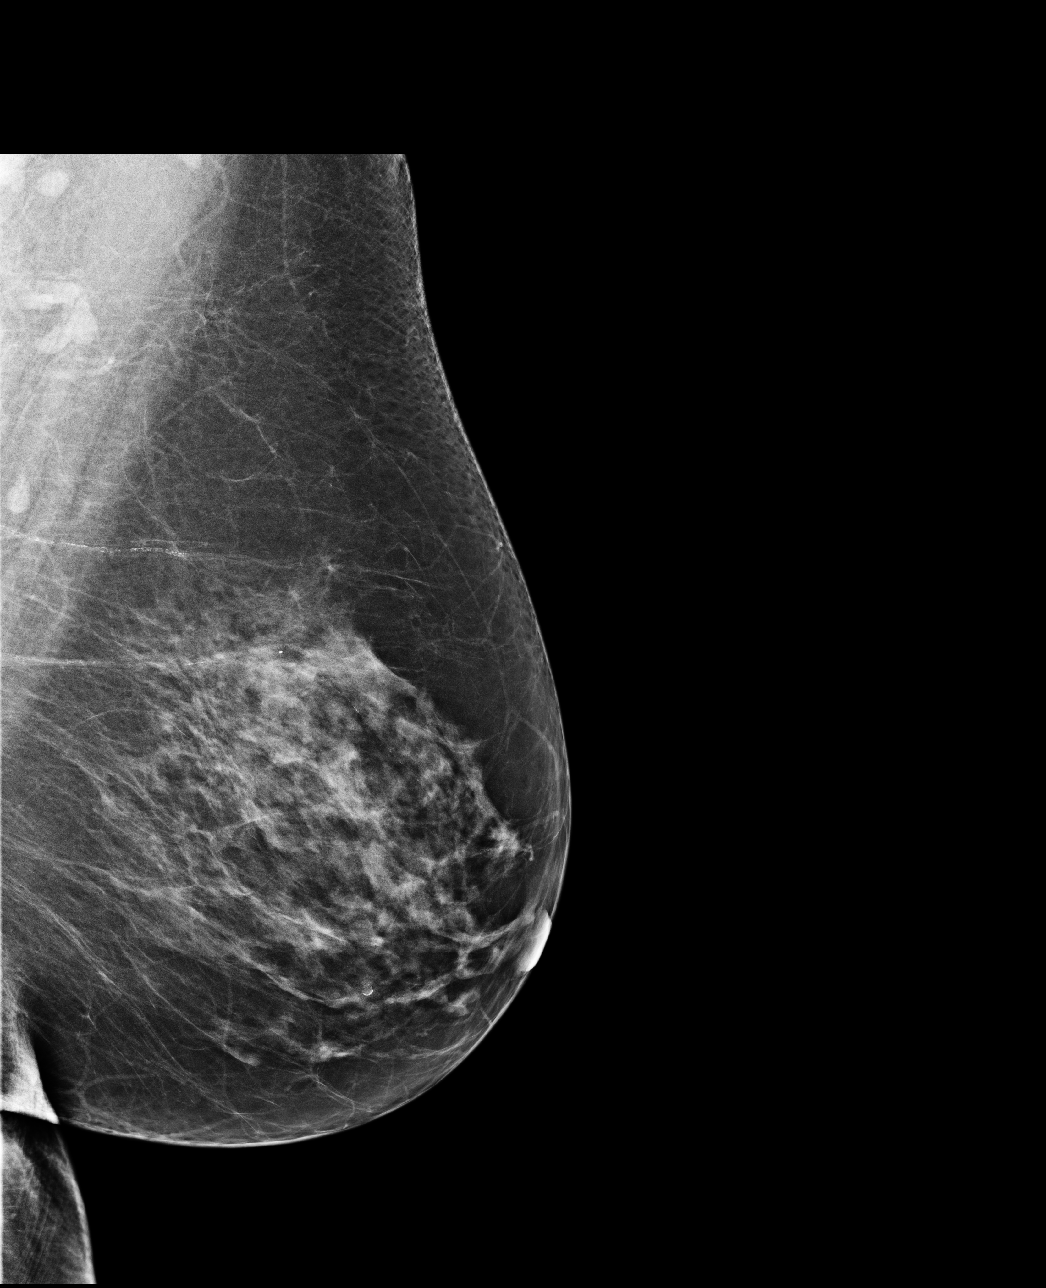

[R CC]
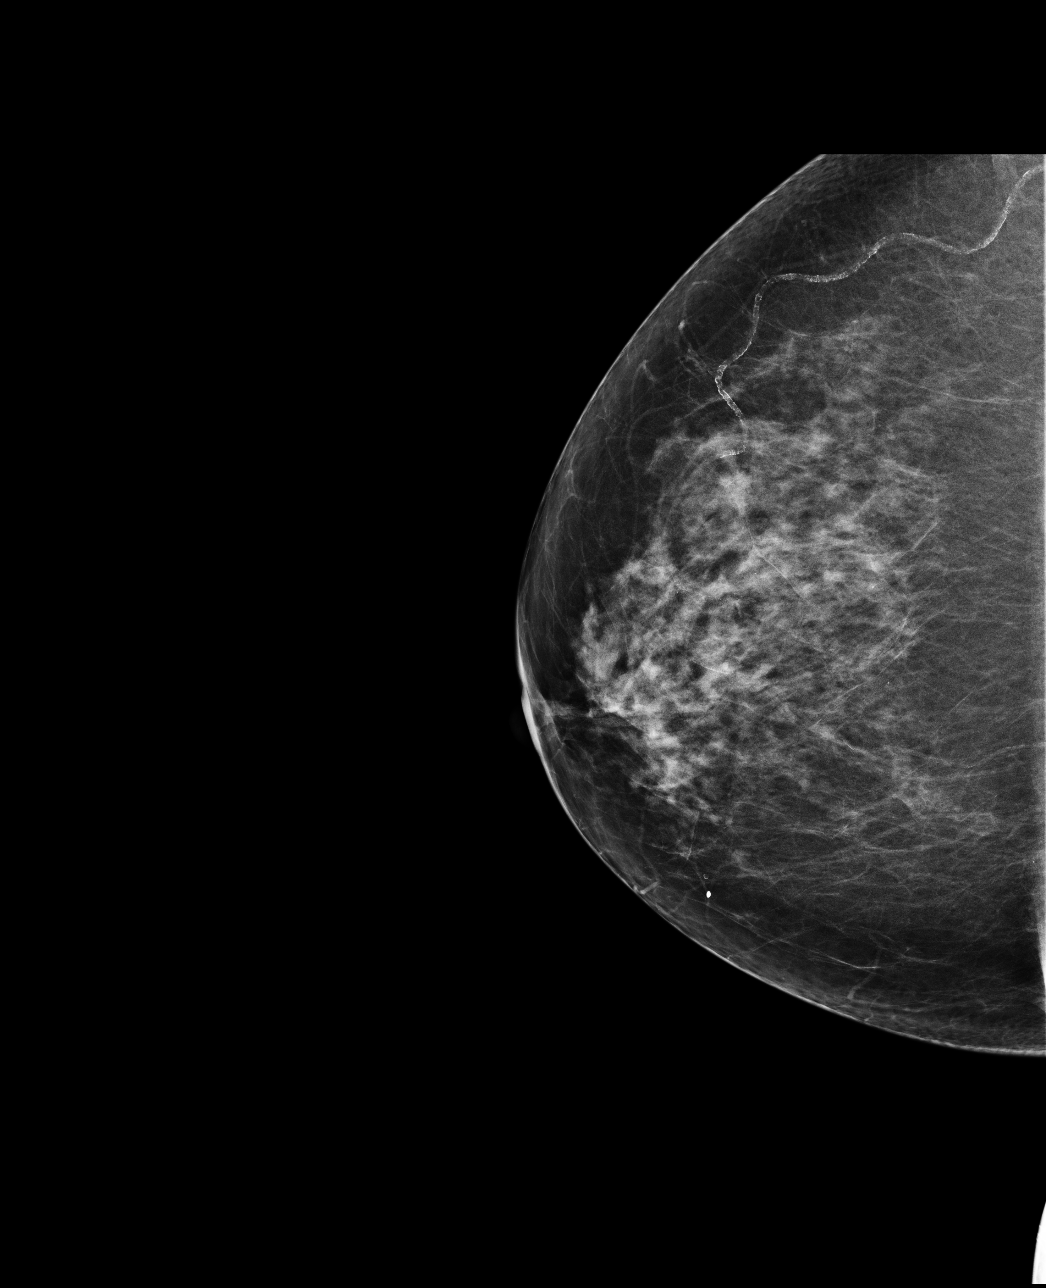

[R MLO]
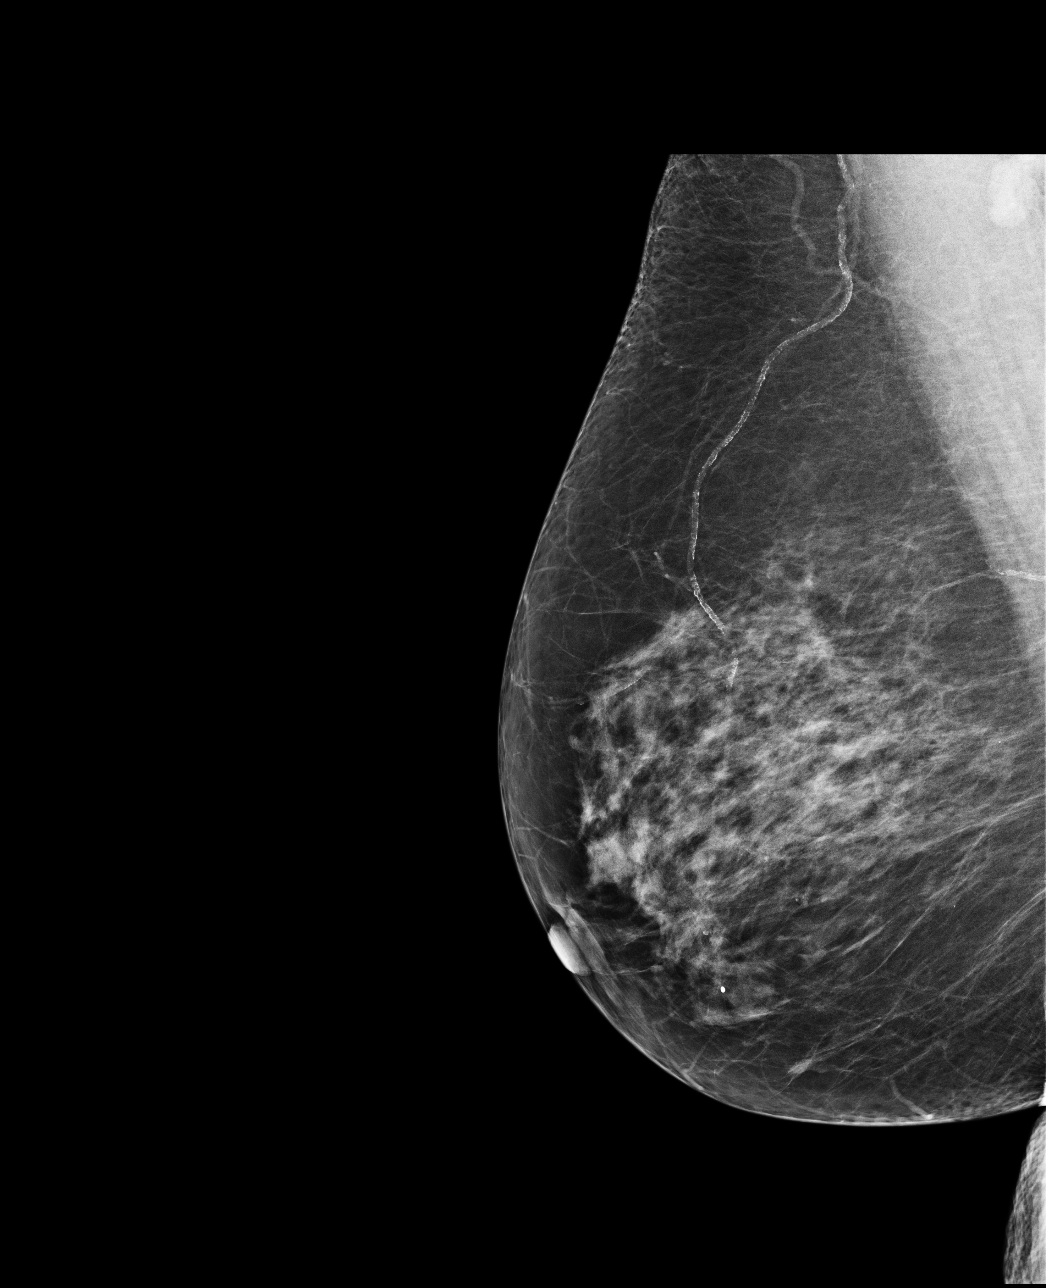

[4 of 4 positions shown; findings below may reference images not displayed]

FINDINGS: ACR Breast Density Category 3: The breast tissue is heterogeneously
dense.

No suspicious masses, architectural distortion, or calcifications
are present.

Images were processed with CAD.
IMPRESSION: No mammographic evidence of malignancy.

A result letter of this screening mammogram will be mailed directly
to the patient.

RECOMMENDATION:
Screening mammogram in one year. (Code:S5-2-VZT)

BI-RADS CATEGORY 1:  Negative.

## 2014-05-27 DIAGNOSIS — M7061 Trochanteric bursitis, right hip: Secondary | ICD-10-CM | POA: Diagnosis not present

## 2014-05-28 ENCOUNTER — Other Ambulatory Visit (HOSPITAL_COMMUNITY): Payer: Self-pay | Admitting: Orthopedic Surgery

## 2014-05-28 DIAGNOSIS — M25551 Pain in right hip: Secondary | ICD-10-CM

## 2014-05-30 ENCOUNTER — Ambulatory Visit (HOSPITAL_COMMUNITY)
Admission: RE | Admit: 2014-05-30 | Discharge: 2014-05-30 | Disposition: A | Discharge status: Home / Self Care | Payer: Medicare Other | Source: Ambulatory Visit | Attending: Orthopedic Surgery | Admitting: Orthopedic Surgery

## 2014-05-30 DIAGNOSIS — G8929 Other chronic pain: Secondary | ICD-10-CM | POA: Insufficient documentation

## 2014-05-30 DIAGNOSIS — M25551 Pain in right hip: Secondary | ICD-10-CM | POA: Diagnosis not present

## 2014-05-30 DIAGNOSIS — M7061 Trochanteric bursitis, right hip: Secondary | ICD-10-CM | POA: Diagnosis not present

## 2014-05-30 DIAGNOSIS — M1611 Unilateral primary osteoarthritis, right hip: Secondary | ICD-10-CM | POA: Diagnosis not present

## 2014-05-30 DIAGNOSIS — M94251 Chondromalacia, right hip: Secondary | ICD-10-CM | POA: Diagnosis not present

## 2014-06-12 DIAGNOSIS — M25552 Pain in left hip: Secondary | ICD-10-CM | POA: Diagnosis not present

## 2014-07-03 DIAGNOSIS — Z79899 Other long term (current) drug therapy: Secondary | ICD-10-CM | POA: Diagnosis not present

## 2014-07-03 DIAGNOSIS — E785 Hyperlipidemia, unspecified: Secondary | ICD-10-CM | POA: Diagnosis not present

## 2014-07-03 DIAGNOSIS — I10 Essential (primary) hypertension: Secondary | ICD-10-CM | POA: Diagnosis not present

## 2014-07-10 DIAGNOSIS — I10 Essential (primary) hypertension: Secondary | ICD-10-CM | POA: Diagnosis not present

## 2014-07-10 DIAGNOSIS — E785 Hyperlipidemia, unspecified: Secondary | ICD-10-CM | POA: Diagnosis not present

## 2014-07-10 DIAGNOSIS — F3340 Major depressive disorder, recurrent, in remission, unspecified: Secondary | ICD-10-CM | POA: Diagnosis not present

## 2014-07-10 DIAGNOSIS — Z23 Encounter for immunization: Secondary | ICD-10-CM | POA: Diagnosis not present

## 2014-07-29 DIAGNOSIS — M7071 Other bursitis of hip, right hip: Secondary | ICD-10-CM | POA: Diagnosis not present

## 2014-09-16 DIAGNOSIS — M1711 Unilateral primary osteoarthritis, right knee: Secondary | ICD-10-CM | POA: Diagnosis not present

## 2014-09-16 DIAGNOSIS — M1611 Unilateral primary osteoarthritis, right hip: Secondary | ICD-10-CM | POA: Diagnosis not present

## 2014-09-25 DIAGNOSIS — M25551 Pain in right hip: Secondary | ICD-10-CM | POA: Diagnosis not present

## 2014-09-30 DIAGNOSIS — H2513 Age-related nuclear cataract, bilateral: Secondary | ICD-10-CM | POA: Diagnosis not present

## 2014-09-30 DIAGNOSIS — Z961 Presence of intraocular lens: Secondary | ICD-10-CM | POA: Diagnosis not present

## 2014-09-30 DIAGNOSIS — H35372 Puckering of macula, left eye: Secondary | ICD-10-CM | POA: Diagnosis not present

## 2014-10-29 ENCOUNTER — Encounter: Payer: Medicare Other | Admitting: Cardiology

## 2014-11-04 DIAGNOSIS — M1611 Unilateral primary osteoarthritis, right hip: Secondary | ICD-10-CM | POA: Diagnosis not present

## 2014-11-05 ENCOUNTER — Encounter: Payer: Self-pay | Admitting: Cardiology

## 2014-11-05 ENCOUNTER — Ambulatory Visit (INDEPENDENT_AMBULATORY_CARE_PROVIDER_SITE_OTHER): Payer: Medicare Other | Admitting: Cardiology

## 2014-11-05 VITALS — BP 132/60 | HR 69 | Ht 60.0 in | Wt 145.0 lb

## 2014-11-05 DIAGNOSIS — I779 Disorder of arteries and arterioles, unspecified: Secondary | ICD-10-CM

## 2014-11-05 DIAGNOSIS — I10 Essential (primary) hypertension: Secondary | ICD-10-CM

## 2014-11-05 DIAGNOSIS — E785 Hyperlipidemia, unspecified: Secondary | ICD-10-CM

## 2014-11-05 DIAGNOSIS — I739 Peripheral vascular disease, unspecified: Secondary | ICD-10-CM

## 2014-11-05 DIAGNOSIS — I951 Orthostatic hypotension: Secondary | ICD-10-CM

## 2014-11-05 NOTE — Progress Notes (Signed)
Patient ID: Mackenzie [name], female   DOB: [birth year], 74 y.o.   MRN: [id number]      Cardiology Office Note   Date:  11/05/2014   ID:  Mackenzie [name], DOB 01-15-1941, MRN [id number]  PCP:  Asencion Noble, MD  Cardiologist:  Dorothy Spark, MD   Chief complain: Dizziness   History of Present Illness: Mackenzie [name] is a 74 y.o. female, prior patient of Dr Verl Blalock who is coming after 4 years. She used to be evaluated for DOE and had negative Lexiscan stress test in 2013 and negative stress echocardiogram in 2014. She denies chest pain, has stable DOE, no palpitations, claudications, LE edema, syncope, PND.  Her PCP follows her cholesterol. Her only complain is dizziness that happens only after standing up from bending position. She recognizes it and is taking it slowly when standing up. With this her symptoms are present but tolerable. On one occasion she felt that she going to pass out but denies syncope.  She will require preop evaluation for her right knee replacement.  Past Medical History  Diagnosis Date  . Hypertension   . Hyperlipidemia   . Mitral valve prolapse     Mitral valve prolapse on echocardiogram in 2004 along with RVH and LVH  . Arteriosclerotic cardiovascular disease (ASCVD)     Cardiac catheterization in 2000: Report is unavailable-chart notes indicate insignificant coronary artery disease  . Colonic polyp     X3 in 2011 along with an ulceration of the cecum  . Anemia, normocytic normochromic 01/2012    H&H-10.6/32; low normal iron, normal ferritin, saturation-16%  . CHF (congestive heart failure)   . Depression     Past Surgical History  Procedure Laterality Date  . Cholecystectomy    . Abdominal hysterectomy    . Colonoscopy  2005    Negative screening study  . Colonoscopy w/ polypectomy  2011    Cecal ulcer; tubular adenoma x3  . Colonoscopy N/A 04/04/2014    Procedure: COLONOSCOPY;  Surgeon: Rogene Houston, MD;  [facility];  Service: Endoscopy;  Laterality: N/A;   1225     Current Outpatient Prescriptions  Medication Sig Dispense Refill  . ALPRAZolam (XANAX) 0.5 MG tablet Take 0.5 mg by mouth 2 (two) times daily as needed. Anxiety/sleep aid    . amLODipine-olmesartan (AZOR) 5-40 MG per tablet Take 1 tablet by mouth at bedtime.     Marland Kitchen aspirin 81 MG tablet Take 81 mg by mouth at bedtime.     . chlorthalidone (HYGROTON) 25 MG tablet Take 25 mg by mouth daily.    . citalopram (CELEXA) 10 MG tablet Take 10 mg by mouth daily.    . Cyanocobalamin (VITAMIN B-12 IJ) Inject 1 mL as directed every 30 (thirty) days.    Marland Kitchen HYDROcodone-acetaminophen (NORCO/VICODIN) 5-325 MG per tablet Take 1 tablet by mouth 4 (four) times daily as needed. For pain    . metoprolol succinate (TOPROL-XL) 25 MG 24 hr tablet Take 25 mg by mouth daily.      . Multiple Vitamin (MULTIVITAMIN WITH MINERALS) TABS Take 1 tablet by mouth daily.    . pravastatin (PRAVACHOL) 40 MG tablet Take 40 mg by mouth at bedtime.      No current facility-administered medications for this visit.    Allergies:   Statins    Social History:  The patient  reports that she quit smoking about 27 years ago. She has never used smokeless tobacco. She reports that she drinks alcohol. She reports that she  does not use illicit drugs.   Family History:  The patient's family history includes Cancer in her mother and another family member; Colon cancer (age of onset: 1) in her maternal uncle; Colon cancer (age of onset: 60) in her maternal aunt; Colon cancer (age of onset: 44) in her maternal aunt; Colon cancer (age of onset: 4) in her maternal aunt; Diabetes in her daughter, son, and another family member; Heart attack (age of onset: 21) in her mother; Hypertension in an other family member; Pancreatitis in her sister.    ROS:  Please see the history of present illness.   Otherwise, review of systems are positive for none.   All other systems are reviewed and negative.    PHYSICAL EXAM: VS:  BP 132/60 mmHg   Pulse 69  Ht 5' (1.524 m)  Wt 145 lb (65.772 kg)  BMI 28.32 kg/m2  SpO2 96% , BMI Body mass index is 28.32 kg/(m^2). GEN: Well nourished, well developed, in no acute distress HEENT: normal Neck: no JVD, carotid bruits, or masses Cardiac: RRR; no murmurs, rubs, or gallops,no edema  Respiratory:  clear to auscultation bilaterally, normal work of breathing GI: soft, nontender, nondistended, + BS MS: no deformity or atrophy Skin: warm and dry, no rash Neuro:  Strength and sensation are intact Psych: euthymic mood, full affect  EKG:  EKG is ordered today. The ekg ordered today demonstrates SR, normal ECG.  Recent Labs: 11/23/2013: BUN 33*; Creatinine, Ser 0.87; Hemoglobin 11.6*; Platelets 334; Potassium 4.6; Sodium 139   Lipid Panel    Component Value Date/Time   CHOL 270* 01/31/2012 0550   TRIG 152* 01/31/2012 0550   HDL 56 01/31/2012 0550   CHOLHDL 4.8 01/31/2012 0550   VLDL 30 01/31/2012 0550   LDLCALC 184* 01/31/2012 0550   Wt Readings from Last 3 Encounters:  11/05/14 145 lb (65.772 kg)  05/30/14 138 lb (62.596 kg)  03/25/14 146 lb 6.4 oz (66.407 kg)    2013 Lexiscan Quantitative Gated Spect Images QGS EDV: 62 ml QGS ESV: 18 ml QGS cine images: NL LV Function; NL Wall Motion QGS EF: 71%  Impression Exercise Capacity: Lexiscan with low level exercise. BP Response: Hypertensive blood pressure response. Clinical Symptoms: Neck tightness with infusion ECG Impression: No significant ST segment change suggestive of ischemia. Comparison with Prior Nuclear Study: Apical defect slightly more prominent compared to previous  Overall Impression: Normal stress nuclear study with a small fixed apical defect suggestive of soft tissue attenuation; no ischemia.    ASSESSMENT AND PLAN:  1.  Orthostatic hypotension - the symptoms are tolerable as the patient always stands up slowly. If her symptoms become worse I would decrease the dose of amlodipine to 2.5 mg po daily  or switch to a different antihypertensive agent. Amlodipine is a vasodilator and can worsen symptoms of orthostatic hypotension. Since her hypertension is difficult to control I wouldn't change her medications right now.  2. Carotid disease - 40-59% B/L ICA disease in 04/2013, we will repeat B/L Carotid US  3. HLP - on pravastatin, followed by her PCP.  4. Preop evaluation - normal ECG, asymptomatic, no contraindication for an orthopedic surgery. No further ischemic workup needed prior to the surgery.  Orders Placed This Encounter  Procedures  . EKG 12-Lead   Follow up in 1 year.  Signed, Dorothy Spark, MD  11/05/2014 2:34 PM    [place] [place], [place], [place]  [address] Phone: [phone]; Fax: [phone]

## 2014-11-05 NOTE — Patient Instructions (Signed)
Medication Instructions:   Your physician recommends that you continue on your current medications as directed. Please refer to the Current Medication list given to you today.     Testing/Procedures:  Your physician has requested that you have a carotid duplex. This test is an ultrasound of the carotid arteries in your neck. It looks at blood flow through these arteries that supply the brain with blood. Allow one hour for this exam. There are no restrictions or special instructions.    Follow-Up:  Your physician wants you to follow-up in: [place] will receive a reminder letter in the mail two months in advance. If you don't receive a letter, please call our office to schedule the follow-up appointment.

## 2014-11-07 ENCOUNTER — Ambulatory Visit (HOSPITAL_COMMUNITY)
Admission: RE | Admit: 2014-11-07 | Discharge: 2014-11-07 | Disposition: A | Discharge status: Home / Self Care | Payer: Medicare Other | Source: Ambulatory Visit | Attending: Cardiology | Admitting: Cardiology

## 2014-11-07 DIAGNOSIS — I779 Disorder of arteries and arterioles, unspecified: Secondary | ICD-10-CM | POA: Diagnosis not present

## 2014-11-07 DIAGNOSIS — I739 Peripheral vascular disease, unspecified: Secondary | ICD-10-CM

## 2014-11-07 DIAGNOSIS — I6523 Occlusion and stenosis of bilateral carotid arteries: Secondary | ICD-10-CM | POA: Diagnosis not present

## 2014-11-07 DIAGNOSIS — E785 Hyperlipidemia, unspecified: Secondary | ICD-10-CM | POA: Insufficient documentation

## 2014-11-07 DIAGNOSIS — I10 Essential (primary) hypertension: Secondary | ICD-10-CM | POA: Insufficient documentation

## 2014-11-11 DIAGNOSIS — M1990 Unspecified osteoarthritis, unspecified site: Secondary | ICD-10-CM | POA: Diagnosis not present

## 2014-11-11 DIAGNOSIS — Z6828 Body mass index (BMI) 28.0-28.9, adult: Secondary | ICD-10-CM | POA: Diagnosis not present

## 2014-11-11 DIAGNOSIS — Z23 Encounter for immunization: Secondary | ICD-10-CM | POA: Diagnosis not present

## 2014-11-11 DIAGNOSIS — F419 Anxiety disorder, unspecified: Secondary | ICD-10-CM | POA: Diagnosis not present

## 2014-11-11 DIAGNOSIS — I10 Essential (primary) hypertension: Secondary | ICD-10-CM | POA: Diagnosis not present

## 2014-11-12 DIAGNOSIS — M1611 Unilateral primary osteoarthritis, right hip: Secondary | ICD-10-CM | POA: Diagnosis not present

## 2014-11-27 DIAGNOSIS — M1611 Unilateral primary osteoarthritis, right hip: Secondary | ICD-10-CM | POA: Diagnosis not present

## 2014-11-27 NOTE — H&P (Signed)
TOTAL HIP ADMISSION H&P  Patient is admitted for right total hip arthroplasty.  Subjective:  Chief Complaint: right hip pain  HPI: Mackenzie [name], 74 y.o. female, has a history of pain and functional disability in the right hip(s) due to arthritis and patient has failed non-surgical conservative treatments for greater than 12 weeks to include NSAID's and/or analgesics, corticosteriod injections, supervised PT with diminished ADL's post treatment, use of assistive devices and activity modification.  Onset of symptoms was gradual starting 1 years ago with gradually worsening course since that time.The patient noted no past surgery on the right hip(s).  Patient currently rates pain in the right hip at 7 out of 10 with activity. Patient has night pain, worsening of pain with activity and weight bearing, pain that interfers with activities of daily living and pain with passive range of motion. Patient has evidence of joint space narrowing by imaging studies. This condition presents safety issues increasing the risk of falls.  There is no current active infection.  Patient Active Problem List   Diagnosis Date Noted  . Bilateral carotid artery disease ([place]) 11/05/2014  . Multiple fractures of ribs of right side 06/18/2012  . Pneumothorax on right 06/18/2012  . Unstable angina ([place]) 01/31/2012  . Cerebrovascular disease 01/31/2012  . Arteriosclerotic cardiovascular disease (ASCVD)   . Hyperlipidemia   . Hypertension   . Anemia, normocytic normochromic 01/15/2012  . MITRAL VALVE PROLAPSE 12/24/2008   Past Medical History  Diagnosis Date  . Hypertension   . Hyperlipidemia   . Mitral valve prolapse     Mitral valve prolapse on echocardiogram in 2004 along with RVH and LVH  . Arteriosclerotic cardiovascular disease (ASCVD)     Cardiac catheterization in 2000: Report is unavailable-chart notes indicate insignificant coronary artery disease  . Colonic polyp     X3 in 2011 along with an ulceration of the  cecum  . Anemia, normocytic normochromic 01/2012    H&H-10.6/32; low normal iron, normal ferritin, saturation-16%  . CHF (congestive heart failure)   . Depression     Past Surgical History  Procedure Laterality Date  . Cholecystectomy    . Abdominal hysterectomy    . Colonoscopy  2005    Negative screening study  . Colonoscopy w/ polypectomy  2011    Cecal ulcer; tubular adenoma x3  . Colonoscopy N/A 04/04/2014    Procedure: COLONOSCOPY;  Surgeon: Rogene Houston, MD;  [facility];  Service: Endoscopy;  Laterality: N/A;  1225    No prescriptions prior to admission   Allergies  Allergen Reactions  . Statins Other (See Comments)    Atorvastatin, pravastatin, simvastatin->myalgias     Social History  Substance Use Topics  . Smoking status: Former Smoker -- 1.00 packs/day for 15 years    Quit date: 01/31/1987  . Smokeless tobacco: Never Used  . Alcohol Use: Yes     Comment: occasionally    Family History  Problem Relation Age of Onset  . Heart attack Mother 82    heart problems  . Cancer Mother     uterus  . Cancer      FH  . Diabetes      FH  . Hypertension      FH  . Pancreatitis Sister     died  age 70  . Diabetes Son   . Diabetes Daughter   . Colon cancer Maternal Aunt 81  . Colon cancer Maternal Aunt 78  . Colon cancer Maternal Aunt 76  .  Colon cancer Maternal Uncle 40     Review of Systems  Constitutional: Negative for fever and chills.  HENT: Negative for congestion and sore throat.   Eyes: Negative for double vision.  Respiratory: Negative for shortness of breath and wheezing.   Cardiovascular: Negative for chest pain and palpitations.  Gastrointestinal: Negative for nausea, vomiting and abdominal pain.  Musculoskeletal:       Right hip pain with prolonged ambulation  Skin: Negative for rash.  Neurological: Negative for dizziness and loss of consciousness.  Psychiatric/Behavioral: Negative for depression and suicidal ideas.     Objective:  Physical Exam  Vitals reviewed. Constitutional: She is oriented to person, place, and time. She appears well-developed and well-nourished.  HENT:  Head: Normocephalic and atraumatic.  Eyes: Conjunctivae and EOM are normal. Pupils are equal, round, and reactive to light.  Neck: Normal range of motion. Neck supple.  Cardiovascular: Normal rate and intact distal pulses.   Respiratory: Effort normal and breath sounds normal.  GI: Soft. Bowel sounds are normal.  Musculoskeletal: Normal range of motion.  Pain with flexion of the hip  Neurological: She is alert and oriented to person, place, and time.  Skin: Skin is warm and dry.  Psychiatric: She has a normal mood and affect. Her behavior is normal. Judgment and thought content normal.    Vital signs in last 24 hours:    Labs:   Estimated body mass index is 28.32 kg/(m^2) as calculated from the following:   Height as of 11/05/14: 5' (1.524 m).   Weight as of 11/05/14: 65.772 kg (145 lb).   Imaging Review Plain radiographs demonstrate severe degenerative joint disease of the right hip(s). The bone quality appears to be fair for age and reported activity level.  Assessment/Plan:  End stage arthritis, right hip(s)  The patient history, physical examination, clinical judgement of the provider and imaging studies are consistent with end stage degenerative joint disease of the right hip(s) and total hip arthroplasty is deemed medically necessary. The treatment options including medical management, injection therapy, arthroscopy and arthroplasty were discussed at length. The risks and benefits of total hip arthroplasty were presented and reviewed. The risks due to aseptic loosening, infection, stiffness, dislocation/subluxation,  thromboembolic complications and other imponderables were discussed.  The patient acknowledged the explanation, agreed to proceed with the plan and consent was signed. Patient is being admitted for  inpatient treatment for surgery, pain control, PT, OT, prophylactic antibiotics, VTE prophylaxis, progressive ambulation and ADL's and discharge planning.The patient is planning to be discharged home with home health services

## 2014-12-04 ENCOUNTER — Ambulatory Visit (HOSPITAL_COMMUNITY)
Admission: RE | Admit: 2014-12-04 | Discharge: 2014-12-04 | Disposition: A | Discharge status: Home / Self Care | Payer: Medicare Other | Source: Ambulatory Visit | Attending: Anesthesiology | Admitting: Anesthesiology

## 2014-12-04 ENCOUNTER — Encounter (HOSPITAL_COMMUNITY): Payer: Self-pay

## 2014-12-04 ENCOUNTER — Encounter (HOSPITAL_COMMUNITY)
Admission: RE | Admit: 2014-12-04 | Discharge: 2014-12-04 | Disposition: A | Discharge status: Home / Self Care | Payer: Medicare Other | Source: Ambulatory Visit | Attending: Orthopedic Surgery | Admitting: Orthopedic Surgery

## 2014-12-04 DIAGNOSIS — R062 Wheezing: Secondary | ICD-10-CM

## 2014-12-04 DIAGNOSIS — Z0183 Encounter for blood typing: Secondary | ICD-10-CM | POA: Insufficient documentation

## 2014-12-04 DIAGNOSIS — Z01812 Encounter for preprocedural laboratory examination: Secondary | ICD-10-CM | POA: Insufficient documentation

## 2014-12-04 DIAGNOSIS — J8410 Pulmonary fibrosis, unspecified: Secondary | ICD-10-CM | POA: Diagnosis not present

## 2014-12-04 DIAGNOSIS — I10 Essential (primary) hypertension: Secondary | ICD-10-CM | POA: Insufficient documentation

## 2014-12-04 DIAGNOSIS — Z01818 Encounter for other preprocedural examination: Secondary | ICD-10-CM | POA: Diagnosis not present

## 2014-12-04 DIAGNOSIS — M1611 Unilateral primary osteoarthritis, right hip: Secondary | ICD-10-CM | POA: Diagnosis not present

## 2014-12-04 DIAGNOSIS — Z79899 Other long term (current) drug therapy: Secondary | ICD-10-CM | POA: Insufficient documentation

## 2014-12-04 HISTORY — DX: Unspecified osteoarthritis, unspecified site: M19.90

## 2014-12-04 HISTORY — DX: Dizziness and giddiness: R42

## 2014-12-04 HISTORY — DX: Rheumatic fever without heart involvement: I00

## 2014-12-04 HISTORY — DX: Anxiety disorder, unspecified: F41.9

## 2014-12-04 HISTORY — DX: Unspecified cataract: H26.9

## 2014-12-04 HISTORY — DX: Personal history of colonic polyps: Z86.010

## 2014-12-04 HISTORY — DX: Pneumonia, unspecified organism: J18.9

## 2014-12-04 HISTORY — DX: Personal history of other diseases of the nervous system and sense organs: Z86.69

## 2014-12-04 HISTORY — DX: Wheezing: R06.2

## 2014-12-04 HISTORY — DX: Personal history of other diseases of the respiratory system: Z87.09

## 2014-12-04 HISTORY — DX: Pain in unspecified joint: M25.50

## 2014-12-04 HISTORY — DX: Personal history of colon polyps, unspecified: Z86.0100

## 2014-12-04 LAB — PROTIME-INR
INR: 1.02 (ref 0.00–1.49)
Prothrombin Time: 13.6 seconds (ref 11.6–15.2)

## 2014-12-04 LAB — URINALYSIS, ROUTINE W REFLEX MICROSCOPIC
BILIRUBIN URINE: NEGATIVE
Glucose, UA: NEGATIVE mg/dL
HGB URINE DIPSTICK: NEGATIVE
Ketones, ur: NEGATIVE mg/dL
Leukocytes, UA: NEGATIVE
Nitrite: NEGATIVE
PH: 7 (ref 5.0–8.0)
Protein, ur: NEGATIVE mg/dL
SPECIFIC GRAVITY, URINE: 1.01 (ref 1.005–1.030)
Urobilinogen, UA: 0.2 mg/dL (ref 0.0–1.0)

## 2014-12-04 LAB — CBC
HEMATOCRIT: 36.7 % (ref 36.0–46.0)
HEMOGLOBIN: 11.8 g/dL — AB (ref 12.0–15.0)
MCH: 30.9 pg (ref 26.0–34.0)
MCHC: 32.2 g/dL (ref 30.0–36.0)
MCV: 96.1 fL (ref 78.0–100.0)
Platelets: 311 10*3/uL (ref 150–400)
RBC: 3.82 MIL/uL — ABNORMAL LOW (ref 3.87–5.11)
RDW: 13.3 % (ref 11.5–15.5)
WBC: 7.4 10*3/uL (ref 4.0–10.5)

## 2014-12-04 LAB — BASIC METABOLIC PANEL
ANION GAP: 6 (ref 5–15)
BUN: 18 mg/dL (ref 6–20)
CHLORIDE: 106 mmol/L (ref 101–111)
CO2: 28 mmol/L (ref 22–32)
Calcium: 9.9 mg/dL (ref 8.9–10.3)
Creatinine, Ser: 0.77 mg/dL (ref 0.44–1.00)
GFR calc Af Amer: >60 mL/min (ref >60)
GFR calc non Af Amer: >60 mL/min (ref >60)
GLUCOSE: 107 mg/dL — AB (ref 65–99)
POTASSIUM: 4.9 mmol/L (ref 3.5–5.1)
Sodium: 140 mmol/L (ref 135–145)

## 2014-12-04 LAB — SURGICAL PCR SCREEN
MRSA, PCR: NEGATIVE
STAPHYLOCOCCUS AUREUS: NEGATIVE

## 2014-12-04 LAB — ABO/RH: ABO/RH(D): A POS

## 2014-12-04 MED ORDER — CHLORHEXIDINE GLUCONATE 4 % EX LIQD: 60.0000 mL | Freq: Once | CUTANEOUS | Status: DC

## 2014-12-04 NOTE — Progress Notes (Addendum)
Cardiologist is Dr.Katrina Meda Coffee with clearance note in epic from 11-05-14  Echo and stress test report in epic from 2013  EKG in epic from 11-05-14  Heart cath early 2000's  CXR denies having one in past yr  Medical Md is Dr.Roy Willey Blade

## 2014-12-04 NOTE — Pre-Procedure Instructions (Signed)
Mackenzie [name]  12/04/2014      [facility] - [place], [place] [address] [place] [address] Phone: [phone] Fax: [phone]  [facility] (NOT [place] [address] [place] [address] Phone: [phone] Fax: [phone]    Your procedure is scheduled on Tues, Nov 1 @ 7:15 AM  Report to [facility] Admitting at 5:30 AM  Call this number if you have problems the morning of surgery:  [phone]   Remember:  Do not eat food or drink liquids after midnight.  Take these medicines the morning of surgery with A SIP OF WATER Alprazolam(Xanax),Amlodipine(Norvasc),Celexa(Citalopram),Pain Pill(if needed),and Metoprolol(Toprol)              Stop taking your Aspirin a week prior to surgery along with any Vitamins or Herbal Medications. No Goody's,BC's,Aleve,Ibuprofen,or Fish Oil.   Do not wear jewelry, make-up or nail polish.  Do not wear lotions, powders, or perfumes.  You may wear deodorant.  Do not shave 48 hours prior to surgery.    Do not bring valuables to the hospital.  [facility] is not responsible for any belongings or valuables.  Contacts, dentures or bridgework may not be worn into surgery.  Leave your suitcase in the car.  After surgery it may be brought to your room.  For patients admitted to the hospital, discharge time will be determined by your treatment team.  Patients discharged the day of surgery will not be allowed to drive home.    Special instructions:  [place] - Preparing for Surgery  Before surgery, you can play an important role.  Because skin is not sterile, your skin needs to be as free of germs as possible.  You can reduce the number of germs on you skin by washing with CHG (chlorahexidine gluconate) soap before surgery.  CHG is an antiseptic cleaner which kills germs and bonds with the skin to continue killing germs even after  washing.  Please DO NOT use if you have an allergy to CHG or antibacterial soaps.  If your skin becomes reddened/irritated stop using the CHG and inform your nurse when you arrive at Short Stay.  Do not shave (including legs and underarms) for at least 48 hours prior to the first CHG shower.  You may shave your face.  Please follow these instructions carefully:   1.  Shower with CHG Soap the night before surgery and the                                morning of Surgery.  2.  If you choose to wash your hair, wash your hair first as usual with your       normal shampoo.  3.  After you shampoo, rinse your hair and body thoroughly to remove the                      Shampoo.  4.  Use CHG as you would any other liquid soap.  You can apply chg directly       to the skin and wash gently with scrungie or a clean washcloth.  5.  Apply the CHG Soap to your body ONLY FROM THE NECK DOWN.        Do not use on open wounds or open sores.  Avoid contact with your eyes,  ears, mouth and genitals (private parts).  Wash genitals (private parts)       with your normal soap.  6.  Wash thoroughly, paying special attention to the area where your surgery        will be performed.  7.  Thoroughly rinse your body with warm water from the neck down.  8.  DO NOT shower/wash with your normal soap after using and rinsing off       the CHG Soap.  9.  Pat yourself dry with a clean towel.            10.  Wear clean pajamas.            11.  Place clean sheets on your bed the night of your first shower and do not        sleep with pets.  Day of Surgery  Do not apply any lotions/deoderants the morning of surgery.  Please wear clean clothes to the hospital/surgery center.    Please read over the following fact sheets that you were given. Pain Booklet, Coughing and Deep Breathing, Blood Transfusion Information, MRSA Information and Surgical Site Infection Prevention

## 2014-12-08 DIAGNOSIS — M6281 Muscle weakness (generalized): Secondary | ICD-10-CM | POA: Diagnosis not present

## 2014-12-08 DIAGNOSIS — R262 Difficulty in walking, not elsewhere classified: Secondary | ICD-10-CM | POA: Diagnosis not present

## 2014-12-08 DIAGNOSIS — M25551 Pain in right hip: Secondary | ICD-10-CM | POA: Diagnosis not present

## 2014-12-08 DIAGNOSIS — M1611 Unilateral primary osteoarthritis, right hip: Secondary | ICD-10-CM | POA: Diagnosis not present

## 2014-12-15 MED ORDER — TRANEXAMIC ACID 1000 MG/10ML IV SOLN
1000.0000 mg | INTRAVENOUS | Status: AC
  Administered 2014-12-16 (×2): 1000 mg via INTRAVENOUS
  Filled 2014-12-15: qty 10

## 2014-12-15 MED ORDER — POTASSIUM CHLORIDE IN NACL 20-0.45 MEQ/L-% IV SOLN
INTRAVENOUS | Status: DC
  Filled 2014-12-15 (×2): qty 1000

## 2014-12-15 MED ORDER — CEFAZOLIN SODIUM-DEXTROSE 2-3 GM-% IV SOLR
2.0000 g | INTRAVENOUS | Status: AC
  Administered 2014-12-16: 2 g via INTRAVENOUS
  Filled 2014-12-15: qty 50

## 2014-12-15 MED ORDER — ACETAMINOPHEN 500 MG PO TABS
1000.0000 mg | ORAL_TABLET | Freq: Once | ORAL | Status: AC
  Administered 2014-12-16: 1000 mg via ORAL
  Filled 2014-12-15: qty 2

## 2014-12-16 ENCOUNTER — Inpatient Hospital Stay (HOSPITAL_COMMUNITY)
Admission: AD | Admit: 2014-12-16 | Discharge: 2014-12-17 | DRG: 470 | Disposition: A | Discharge status: Home Health | Payer: Medicare Other | Source: Ambulatory Visit | Attending: Orthopedic Surgery | Admitting: Orthopedic Surgery

## 2014-12-16 ENCOUNTER — Inpatient Hospital Stay (HOSPITAL_COMMUNITY): Payer: Medicare Other | Admitting: Certified Registered"

## 2014-12-16 ENCOUNTER — Encounter (HOSPITAL_COMMUNITY)
Admission: AD | Disposition: A | Discharge status: Home Health | Payer: Self-pay | Source: Ambulatory Visit | Attending: Orthopedic Surgery

## 2014-12-16 ENCOUNTER — Inpatient Hospital Stay (HOSPITAL_COMMUNITY): Payer: Medicare Other

## 2014-12-16 ENCOUNTER — Encounter (HOSPITAL_COMMUNITY): Payer: Self-pay | Admitting: Certified Registered"

## 2014-12-16 DIAGNOSIS — Z87891 Personal history of nicotine dependence: Secondary | ICD-10-CM | POA: Diagnosis not present

## 2014-12-16 DIAGNOSIS — M169 Osteoarthritis of hip, unspecified: Secondary | ICD-10-CM | POA: Diagnosis not present

## 2014-12-16 DIAGNOSIS — Z419 Encounter for procedure for purposes other than remedying health state, unspecified: Secondary | ICD-10-CM

## 2014-12-16 DIAGNOSIS — Z96641 Presence of right artificial hip joint: Secondary | ICD-10-CM | POA: Diagnosis not present

## 2014-12-16 DIAGNOSIS — Z471 Aftercare following joint replacement surgery: Secondary | ICD-10-CM | POA: Diagnosis not present

## 2014-12-16 DIAGNOSIS — M1611 Unilateral primary osteoarthritis, right hip: Principal | ICD-10-CM | POA: Diagnosis present

## 2014-12-16 DIAGNOSIS — Z833 Family history of diabetes mellitus: Secondary | ICD-10-CM | POA: Diagnosis not present

## 2014-12-16 DIAGNOSIS — F419 Anxiety disorder, unspecified: Secondary | ICD-10-CM | POA: Diagnosis present

## 2014-12-16 DIAGNOSIS — E785 Hyperlipidemia, unspecified: Secondary | ICD-10-CM | POA: Diagnosis present

## 2014-12-16 DIAGNOSIS — D62 Acute posthemorrhagic anemia: Secondary | ICD-10-CM | POA: Diagnosis not present

## 2014-12-16 DIAGNOSIS — Z8249 Family history of ischemic heart disease and other diseases of the circulatory system: Secondary | ICD-10-CM

## 2014-12-16 DIAGNOSIS — I110 Hypertensive heart disease with heart failure: Secondary | ICD-10-CM | POA: Diagnosis present

## 2014-12-16 DIAGNOSIS — I2510 Atherosclerotic heart disease of native coronary artery without angina pectoris: Secondary | ICD-10-CM | POA: Diagnosis present

## 2014-12-16 DIAGNOSIS — I509 Heart failure, unspecified: Secondary | ICD-10-CM | POA: Diagnosis present

## 2014-12-16 DIAGNOSIS — F329 Major depressive disorder, single episode, unspecified: Secondary | ICD-10-CM | POA: Diagnosis present

## 2014-12-16 DIAGNOSIS — M25551 Pain in right hip: Secondary | ICD-10-CM | POA: Diagnosis not present

## 2014-12-16 HISTORY — PX: TOTAL HIP ARTHROPLASTY: SHX124

## 2014-12-16 LAB — POCT I-STAT 4, (NA,K, GLUC, HGB,HCT)
GLUCOSE: 110 mg/dL — AB (ref 65–99)
HEMATOCRIT: 23 % — AB (ref 36.0–46.0)
Hemoglobin: 7.8 g/dL — ABNORMAL LOW (ref 12.0–15.0)
POTASSIUM: 4.4 mmol/L (ref 3.5–5.1)
Sodium: 141 mmol/L (ref 135–145)

## 2014-12-16 LAB — PREPARE RBC (CROSSMATCH)

## 2014-12-16 SURGERY — ARTHROPLASTY, HIP, TOTAL, ANTERIOR APPROACH
Anesthesia: Monitor Anesthesia Care | Site: Hip | Laterality: Right

## 2014-12-16 MED ORDER — PROMETHAZINE HCL 25 MG/ML IJ SOLN
INTRAMUSCULAR | Status: AC
  Filled 2014-12-16: qty 1

## 2014-12-16 MED ORDER — BUPIVACAINE HCL (PF) 0.5 % IJ SOLN
INTRAMUSCULAR | Status: DC | PRN
  Administered 2014-12-16: 3 mL via INTRATHECAL

## 2014-12-16 MED ORDER — MIDAZOLAM HCL 2 MG/2ML IJ SOLN
INTRAMUSCULAR | Status: AC
  Filled 2014-12-16: qty 4

## 2014-12-16 MED ORDER — PRAVASTATIN SODIUM 40 MG PO TABS
40.0000 mg | ORAL_TABLET | ORAL | Status: DC
  Administered 2014-12-17: 40 mg via ORAL
  Filled 2014-12-16: qty 1

## 2014-12-16 MED ORDER — DOCUSATE SODIUM 100 MG PO CAPS
100.0000 mg | ORAL_CAPSULE | Freq: Two times a day (BID) | ORAL | Status: DC
  Administered 2014-12-16 – 2014-12-17 (×2): 100 mg via ORAL
  Filled 2014-12-16 (×2): qty 1

## 2014-12-16 MED ORDER — ONDANSETRON HCL 4 MG/2ML IJ SOLN
INTRAMUSCULAR | Status: DC | PRN
Start: 2014-12-16 — End: 2014-12-16
  Administered 2014-12-16: 4 mg via INTRAVENOUS

## 2014-12-16 MED ORDER — HYDROCODONE-ACETAMINOPHEN 5-325 MG PO TABS: 1.0000 | ORAL_TABLET | Freq: Four times a day (QID) | ORAL | Status: DC | PRN

## 2014-12-16 MED ORDER — ASPIRIN EC 325 MG PO TBEC
325.0000 mg | DELAYED_RELEASE_TABLET | Freq: Every day | ORAL | Status: DC
  Administered 2014-12-17: 325 mg via ORAL
  Filled 2014-12-16: qty 1

## 2014-12-16 MED ORDER — POTASSIUM CHLORIDE IN NACL 20-0.45 MEQ/L-% IV SOLN
INTRAVENOUS | Status: DC
Start: 2014-12-16 — End: 2014-12-17
  Administered 2014-12-16: 20:00:00 via INTRAVENOUS
  Filled 2014-12-16 (×5): qty 1000

## 2014-12-16 MED ORDER — HYDROMORPHONE HCL 1 MG/ML IJ SOLN
1.0000 mg | INTRAMUSCULAR | Status: DC | PRN
  Administered 2014-12-16 – 2014-12-17 (×5): 1 mg via INTRAVENOUS
  Filled 2014-12-16 (×4): qty 1

## 2014-12-16 MED ORDER — LIDOCAINE HCL (CARDIAC) 20 MG/ML IV SOLN
INTRAVENOUS | Status: DC | PRN
  Administered 2014-12-16: 40 mg via INTRAVENOUS

## 2014-12-16 MED ORDER — ONDANSETRON HCL 4 MG/2ML IJ SOLN
INTRAMUSCULAR | Status: AC
  Filled 2014-12-16: qty 2

## 2014-12-16 MED ORDER — PROPOFOL 10 MG/ML IV BOLUS
INTRAVENOUS | Status: AC
  Filled 2014-12-16: qty 20

## 2014-12-16 MED ORDER — METOPROLOL SUCCINATE ER 25 MG PO TB24
25.0000 mg | ORAL_TABLET | Freq: Every day | ORAL | Status: DC
Start: 2014-12-17 — End: 2014-12-17
  Administered 2014-12-17: 25 mg via ORAL
  Filled 2014-12-16: qty 1

## 2014-12-16 MED ORDER — CELECOXIB 200 MG PO CAPS
200.0000 mg | ORAL_CAPSULE | Freq: Two times a day (BID) | ORAL | Status: DC
  Administered 2014-12-16 – 2014-12-17 (×2): 200 mg via ORAL
  Filled 2014-12-16 (×2): qty 1

## 2014-12-16 MED ORDER — MENTHOL 3 MG MT LOZG: 1.0000 | LOZENGE | OROMUCOSAL | Status: DC | PRN

## 2014-12-16 MED ORDER — CITALOPRAM HYDROBROMIDE 10 MG PO TABS
10.0000 mg | ORAL_TABLET | Freq: Every day | ORAL | Status: DC
  Administered 2014-12-17: 10 mg via ORAL
  Filled 2014-12-16: qty 1

## 2014-12-16 MED ORDER — METHOCARBAMOL 1000 MG/10ML IJ SOLN
500.0000 mg | INTRAVENOUS | Status: DC
  Administered 2014-12-16: 500 mg via INTRAVENOUS
  Filled 2014-12-16: qty 5

## 2014-12-16 MED ORDER — ONDANSETRON HCL 4 MG PO TABS: 4.0000 mg | ORAL_TABLET | Freq: Three times a day (TID) | ORAL | Status: DC | PRN

## 2014-12-16 MED ORDER — PHENOL 1.4 % MT LIQD: 1.0000 | OROMUCOSAL | Status: DC | PRN

## 2014-12-16 MED ORDER — ACETAMINOPHEN 325 MG PO TABS
650.0000 mg | ORAL_TABLET | Freq: Four times a day (QID) | ORAL | Status: DC | PRN
Start: 2014-12-16 — End: 2014-12-17

## 2014-12-16 MED ORDER — FENTANYL CITRATE (PF) 250 MCG/5ML IJ SOLN
INTRAMUSCULAR | Status: AC
  Filled 2014-12-16: qty 5

## 2014-12-16 MED ORDER — FENTANYL CITRATE (PF) 100 MCG/2ML IJ SOLN
INTRAMUSCULAR | Status: DC | PRN
  Administered 2014-12-16: 50 ug via INTRAVENOUS
  Administered 2014-12-16: 2 ug via INTRAVENOUS

## 2014-12-16 MED ORDER — LIDOCAINE HCL (CARDIAC) 20 MG/ML IV SOLN
INTRAVENOUS | Status: AC
  Filled 2014-12-16: qty 5

## 2014-12-16 MED ORDER — METOCLOPRAMIDE HCL 5 MG/ML IJ SOLN: 5.0000 mg | Freq: Three times a day (TID) | INTRAMUSCULAR | Status: DC | PRN

## 2014-12-16 MED ORDER — SODIUM CHLORIDE 0.9 % IV SOLN: 10.0000 mL/h | Freq: Once | INTRAVENOUS | Status: DC

## 2014-12-16 MED ORDER — AMLODIPINE-OLMESARTAN 5-40 MG PO TABS: 1.0000 | ORAL_TABLET | Freq: Every day | ORAL | Status: DC

## 2014-12-16 MED ORDER — PROPOFOL 500 MG/50ML IV EMUL
INTRAVENOUS | Status: DC | PRN
  Administered 2014-12-16: 50 ug/kg/min via INTRAVENOUS

## 2014-12-16 MED ORDER — ALPRAZOLAM 0.5 MG PO TABS
0.5000 mg | ORAL_TABLET | Freq: Two times a day (BID) | ORAL | Status: DC | PRN
  Administered 2014-12-16: 0.5 mg via ORAL
  Filled 2014-12-16: qty 1

## 2014-12-16 MED ORDER — METOCLOPRAMIDE HCL 5 MG PO TABS: 5.0000 mg | ORAL_TABLET | Freq: Three times a day (TID) | ORAL | Status: DC | PRN

## 2014-12-16 MED ORDER — PHENYLEPHRINE HCL 10 MG/ML IJ SOLN
INTRAMUSCULAR | Status: DC | PRN
  Administered 2014-12-16 (×2): 80 ug via INTRAVENOUS

## 2014-12-16 MED ORDER — ACETAMINOPHEN 650 MG RE SUPP: 650.0000 mg | Freq: Four times a day (QID) | RECTAL | Status: DC | PRN

## 2014-12-16 MED ORDER — HYDROCODONE-ACETAMINOPHEN 5-325 MG PO TABS
ORAL_TABLET | ORAL | Status: AC
  Filled 2014-12-16: qty 2

## 2014-12-16 MED ORDER — DOCUSATE SODIUM 100 MG PO CAPS: 100.0000 mg | ORAL_CAPSULE | Freq: Two times a day (BID) | ORAL | Status: DC

## 2014-12-16 MED ORDER — POLYETHYLENE GLYCOL 3350 17 G PO PACK: 17.0000 g | PACK | Freq: Every day | ORAL | Status: DC | PRN

## 2014-12-16 MED ORDER — PROMETHAZINE HCL 25 MG/ML IJ SOLN
6.2500 mg | Freq: Once | INTRAMUSCULAR | Status: AC
  Administered 2014-12-16: 6.25 mg via INTRAVENOUS

## 2014-12-16 MED ORDER — TRANEXAMIC ACID 1000 MG/10ML IV SOLN
1000.0000 mg | INTRAVENOUS | Status: DC
  Filled 2014-12-16: qty 10

## 2014-12-16 MED ORDER — METHOCARBAMOL 500 MG PO TABS: 500.0000 mg | ORAL_TABLET | Freq: Four times a day (QID) | ORAL | Status: DC

## 2014-12-16 MED ORDER — DEXAMETHASONE SODIUM PHOSPHATE 10 MG/ML IJ SOLN
10.0000 mg | Freq: Once | INTRAMUSCULAR | Status: AC
  Administered 2014-12-17: 10 mg via INTRAVENOUS
  Filled 2014-12-16: qty 1

## 2014-12-16 MED ORDER — AMLODIPINE BESYLATE 5 MG PO TABS
5.0000 mg | ORAL_TABLET | Freq: Every day | ORAL | Status: DC
  Administered 2014-12-16: 5 mg via ORAL
  Filled 2014-12-16: qty 1

## 2014-12-16 MED ORDER — ONDANSETRON HCL 4 MG/2ML IJ SOLN
4.0000 mg | Freq: Four times a day (QID) | INTRAMUSCULAR | Status: DC | PRN
  Administered 2014-12-16: 4 mg via INTRAVENOUS

## 2014-12-16 MED ORDER — 0.9 % SODIUM CHLORIDE (POUR BTL) OPTIME
TOPICAL | Status: DC | PRN
  Administered 2014-12-16: 1000 mL

## 2014-12-16 MED ORDER — LACTATED RINGERS IV SOLN
INTRAVENOUS | Status: DC | PRN
  Administered 2014-12-16 (×2): via INTRAVENOUS

## 2014-12-16 MED ORDER — IRBESARTAN 300 MG PO TABS
300.0000 mg | ORAL_TABLET | Freq: Every day | ORAL | Status: DC
  Administered 2014-12-16: 300 mg via ORAL
  Filled 2014-12-16: qty 1

## 2014-12-16 MED ORDER — CEFAZOLIN SODIUM-DEXTROSE 2-3 GM-% IV SOLR
2.0000 g | Freq: Four times a day (QID) | INTRAVENOUS | Status: AC
  Administered 2014-12-16: 2 g via INTRAVENOUS
  Filled 2014-12-16 (×3): qty 50

## 2014-12-16 MED ORDER — METHOCARBAMOL 1000 MG/10ML IJ SOLN
500.0000 mg | Freq: Four times a day (QID) | INTRAVENOUS | Status: DC | PRN
  Filled 2014-12-16: qty 5

## 2014-12-16 MED ORDER — METHOCARBAMOL 500 MG PO TABS
500.0000 mg | ORAL_TABLET | Freq: Four times a day (QID) | ORAL | Status: DC | PRN
  Administered 2014-12-17 (×2): 500 mg via ORAL
  Filled 2014-12-16 (×3): qty 1

## 2014-12-16 MED ORDER — ALBUMIN HUMAN 5 % IV SOLN
INTRAVENOUS | Status: DC | PRN
  Administered 2014-12-16: 09:00:00 via INTRAVENOUS

## 2014-12-16 MED ORDER — ONDANSETRON HCL 4 MG PO TABS: 4.0000 mg | ORAL_TABLET | Freq: Four times a day (QID) | ORAL | Status: DC | PRN

## 2014-12-16 MED ORDER — HYDROCODONE-ACETAMINOPHEN 5-325 MG PO TABS
1.0000 | ORAL_TABLET | ORAL | Status: DC | PRN
  Administered 2014-12-16: 2 via ORAL
  Administered 2014-12-16: 1 via ORAL
  Administered 2014-12-16 – 2014-12-17 (×4): 2 via ORAL
  Administered 2014-12-17 (×2): 1 via ORAL
  Filled 2014-12-16 (×4): qty 2
  Filled 2014-12-16: qty 1
  Filled 2014-12-16: qty 2

## 2014-12-16 MED ORDER — ASPIRIN 325 MG PO TABS: 325.0000 mg | ORAL_TABLET | Freq: Every day | ORAL | Status: DC

## 2014-12-16 MED ORDER — HYDROMORPHONE HCL 1 MG/ML IJ SOLN
INTRAMUSCULAR | Status: AC
  Filled 2014-12-16: qty 1

## 2014-12-16 SURGICAL SUPPLY — 55 items
BAG DECANTER FOR FLEXI CONT (MISCELLANEOUS) IMPLANT
BLADE SAW SGTL 18X1.27X75 (BLADE) ×2 IMPLANT
BLADE SAW SGTL 18X1.27X75MM (BLADE) ×1
BLADE SURG ROTATE 9660 (MISCELLANEOUS) IMPLANT
CAPT HIP TOTAL 2 ×2 IMPLANT
CLOSURE STERI-STRIP 1/2X4 (GAUZE/BANDAGES/DRESSINGS) ×1
CLSR STERI-STRIP ANTIMIC 1/2X4 (GAUZE/BANDAGES/DRESSINGS) ×2 IMPLANT
COVER SURGICAL LIGHT HANDLE (MISCELLANEOUS) ×3 IMPLANT
DRAPE C-ARM 42X72 X-RAY (DRAPES) ×2 IMPLANT
DRAPE INCISE IOBAN 66X45 STRL (DRAPES) ×3 IMPLANT
DRAPE ORTHO SPLIT 77X108 STRL (DRAPES) ×6
DRAPE SURG ORHT 6 SPLT 77X108 (DRAPES) ×2 IMPLANT
DRAPE U-SHAPE 47X51 STRL (DRAPES) ×3 IMPLANT
DRSG MEPILEX BORDER 4X8 (GAUZE/BANDAGES/DRESSINGS) ×3 IMPLANT
DURAPREP 26ML APPLICATOR (WOUND CARE) ×3 IMPLANT
ELECT BLADE 4.0 EZ CLEAN MEGAD (MISCELLANEOUS) ×3
ELECT REM PT RETURN 9FT ADLT (ELECTROSURGICAL) ×3
ELECTRODE BLDE 4.0 EZ CLN MEGD (MISCELLANEOUS) ×1 IMPLANT
ELECTRODE REM PT RTRN 9FT ADLT (ELECTROSURGICAL) ×1 IMPLANT
FACESHIELD WRAPAROUND (MASK) ×6 IMPLANT
FACESHIELD WRAPAROUND OR TEAM (MASK) ×2 IMPLANT
GLOVE BIO SURGEON STRL SZ7 (GLOVE) ×3 IMPLANT
GLOVE BIO SURGEON STRL SZ7.5 (GLOVE) ×3 IMPLANT
GLOVE BIOGEL PI IND STRL 7.0 (GLOVE) ×1 IMPLANT
GLOVE BIOGEL PI IND STRL 8 (GLOVE) ×1 IMPLANT
GLOVE BIOGEL PI INDICATOR 7.0 (GLOVE) ×2
GLOVE BIOGEL PI INDICATOR 8 (GLOVE) ×2
GOWN STRL REUS W/ TWL LRG LVL3 (GOWN DISPOSABLE) ×2 IMPLANT
GOWN STRL REUS W/ TWL XL LVL3 (GOWN DISPOSABLE) ×1 IMPLANT
GOWN STRL REUS W/TWL LRG LVL3 (GOWN DISPOSABLE) ×6
GOWN STRL REUS W/TWL XL LVL3 (GOWN DISPOSABLE) ×3
KIT BASIN OR (CUSTOM PROCEDURE TRAY) ×3 IMPLANT
KIT ROOM TURNOVER OR (KITS) ×3 IMPLANT
MANIFOLD NEPTUNE II (INSTRUMENTS) ×3 IMPLANT
NDL 18GX1X1/2 (RX/OR ONLY) (NEEDLE) IMPLANT
NDL SAFETY ECLIPSE 18X1.5 (NEEDLE) IMPLANT
NEEDLE 18GX1X1/2 (RX/OR ONLY) (NEEDLE) ×3 IMPLANT
NEEDLE HYPO 18GX1.5 SHARP (NEEDLE)
NS IRRIG 1000ML POUR BTL (IV SOLUTION) ×3 IMPLANT
PACK TOTAL JOINT (CUSTOM PROCEDURE TRAY) ×3 IMPLANT
PACK UNIVERSAL I (CUSTOM PROCEDURE TRAY) ×3 IMPLANT
PAD ARMBOARD 7.5X6 YLW CONV (MISCELLANEOUS) ×3 IMPLANT
SPONGE LAP 18X18 X RAY DECT (DISPOSABLE) IMPLANT
SUT MNCRL AB 4-0 PS2 18 (SUTURE) ×3 IMPLANT
SUT MON AB 2-0 CT1 36 (SUTURE) ×3 IMPLANT
SUT VIC AB 0 CT1 27 (SUTURE) ×3
SUT VIC AB 0 CT1 27XBRD ANBCTR (SUTURE) ×1 IMPLANT
SUT VIC AB 1 CT1 27 (SUTURE) ×3
SUT VIC AB 1 CT1 27XBRD ANBCTR (SUTURE) ×1 IMPLANT
SYR 50ML LL SCALE MARK (SYRINGE) IMPLANT
SYRINGE 20CC LL (MISCELLANEOUS) ×3 IMPLANT
TOWEL OR 17X24 6PK STRL BLUE (TOWEL DISPOSABLE) ×3 IMPLANT
TOWEL OR 17X26 10 PK STRL BLUE (TOWEL DISPOSABLE) ×3 IMPLANT
TRAY FOLEY CATH 16FRSI W/METER (SET/KITS/TRAYS/PACK) IMPLANT
WATER STERILE IRR 1000ML POUR (IV SOLUTION) ×3 IMPLANT

## 2014-12-16 NOTE — Anesthesia Preprocedure Evaluation (Addendum)
Anesthesia Evaluation  Patient identified by MRN, date of birth, ID band Patient awake    Reviewed: Allergy & Precautions, NPO status , Patient's Chart, lab work & pertinent test results, reviewed documented beta blocker date and time   History of Anesthesia Complications Negative for: history of anesthetic complications  Airway Mallampati: II  TM Distance: >3 FB Neck ROM: Full    Dental  (+) Partial Lower, Dental Advisory Given   Pulmonary neg shortness of breath, neg sleep apnea, neg COPD, neg recent URI, former smoker, neg PE   breath sounds clear to auscultation       Cardiovascular hypertension, Pt. on medications and Pt. on home beta blockers + angina + Peripheral Vascular Disease and +CHF   Rhythm:Regular     Neuro/Psych PSYCHIATRIC DISORDERS Anxiety Depression negative neurological ROS     GI/Hepatic negative GI ROS, Neg liver ROS,   Endo/Other  negative endocrine ROS  Renal/GU negative Renal ROS     Musculoskeletal  (+) Arthritis ,   Abdominal   Peds  Hematology  (+) anemia ,   Anesthesia Other Findings   Reproductive/Obstetrics                           Anesthesia Physical Anesthesia Plan  ASA: II  Anesthesia Plan: Spinal and MAC   Post-op Pain Management:    Induction:   Airway Management Planned: Nasal Cannula, Simple Face Mask and Natural Airway  Additional Equipment: None  Intra-op Plan:   Post-operative Plan:   Informed Consent: I have reviewed the patients History and Physical, chart, labs and discussed the procedure including the risks, benefits and alternatives for the proposed anesthesia with the patient or authorized representative who has indicated his/her understanding and acceptance.   Dental advisory given  Plan Discussed with: Anesthesiologist, Surgeon and CRNA  Anesthesia Plan Comments:       Anesthesia Quick Evaluation

## 2014-12-16 NOTE — Discharge Instructions (Signed)

## 2014-12-16 NOTE — Evaluation (Signed)
Occupational Therapy Evaluation Patient Details Name: Mackenzie [name] MRN: [id number] DOB: 1940-05-15 Today's Date: 12/16/2014    History of Present Illness Patient is a 74 y.o. female s/p R THA, direct anterior approach. PMH includes HTN, HLD, anxiety, depression, CHF, cataracts.   Clinical Impression   Pt s/p above. Pt independent with ADLs, PTA. Feel pt will benefit from acute OT to increase independence prior to d/c.Do not feel pt will need follow up OT upon d/c.    Follow Up Recommendations  No OT follow up;Supervision - Intermittent    Equipment Recommendations  3 in 1 bedside comode    Recommendations for Other Services       Precautions / Restrictions Precautions Precautions: Fall Precaution Comments: direct anterior approach Restrictions Weight Bearing Restrictions: Yes RLE Weight Bearing: Weight bearing as tolerated      Mobility Bed Mobility Overal bed mobility: Needs Assistance Bed Mobility: Sit to Supine;Supine to Sit     Supine to sit: Min assist Sit to supine: Min assist   General bed mobility comments: assist with RLE  Transfers Overall transfer level: Needs assistance Equipment used: Rolling walker (2 wheeled) Transfers: Sit to/from [facility] Sit to Stand: Min guard Stand pivot transfers: Min guard          Balance Used RW for support. Balance not formally assessed.                           ADL Overall ADL's : Needs assistance/impaired Eating/Feeding: Independent                   Lower Body Dressing: Sit to/from stand;Moderate assistance   Toilet Transfer: Min guard;Stand-pivot;RW;BSC   Toileting- Clothing Manipulation and Hygiene: Sit to/from stand;Min guard       Functional mobility during ADLs: Min guard;Rolling walker General ADL Comments: Discussed 3 in 1.     Vision     Perception     Praxis      Pertinent Vitals/Pain Pain Assessment: 0-10 Pain Score: 7 Pain Location: RLE Pain  Descriptors / Indicators: Aching Pain Intervention(s): Monitored during session;Repositioned     Hand Dominance     Extremity/Trunk Assessment Upper Extremity Assessment Upper Extremity Assessment: Overall WFL for tasks assessed   Lower Extremity Assessment Lower Extremity Assessment: Defer to PT evaluation RLE Deficits / Details: Limited AROM/strength secondary to pain/surgery. Able to perform LAQ. Difficulty with hip abd/add. RLE Sensation:  [facility].)       Communication Communication Communication: No difficulties   Cognition Arousal/Alertness: Awake/alert Behavior During Therapy: WFL for tasks assessed/performed Overall Cognitive Status: Within Functional Limits for tasks assessed                     General Comments       Exercises       Shoulder Instructions      Home Living Family/patient expects to be discharged to:: Private residence Living Arrangements: Spouse/significant other Available Help at Discharge: Friend(s);Available 24 hours/day Type of Home: House Home Access: Stairs to enter [employer] of Steps: 3 Entrance Stairs-Rails: Right Home Layout: One level     Bathroom Shower/Tub: Tub/shower unit;Walk-in shower   Bathroom Toilet:  (comfort height)     Home Equipment: None          Prior Functioning/Environment Level of Independence: Independent        Comments: Friend of 44 years will be staying with pt at discharge.  OT Diagnosis: Acute pain   OT Problem List: Decreased knowledge of precautions;Decreased activity tolerance;Decreased knowledge of use of DME or AE;Pain   OT Treatment/Interventions: Self-care/ADL training;DME and/or AE instruction;Therapeutic activities;Patient/family education;Balance training    OT Goals(Current goals can be found in the care plan section) Acute Rehab OT Goals Patient Stated Goal: not stated OT Goal Formulation: With patient Time For Goal Achievement: 12/23/14 Potential to  Achieve Goals: Good ADL Goals Pt Will Perform Lower Body Dressing: with set-up;sit to/from stand Pt Will Transfer to Toilet: with supervision;ambulating;grab bars (comfort height toilet) Pt Will Perform Tub/Shower Transfer: Shower transfer;ambulating;3 in 1;rolling walker;with set-up;with supervision  OT Frequency: Min 2X/week   Barriers to D/C:            Co-evaluation              End of Session Equipment Utilized During Treatment: Gait belt;Rolling walker (O2 placed back on towards end of session)  Activity Tolerance:  (nauseous) Patient left: in bed;with call bell/phone within reach;with bed alarm set   Time: 0258-5277 OT Time Calculation (min): 16 min Charges:  OT General Charges $OT Visit: 1 Procedure OT Evaluation $Initial OT Evaluation Tier I: 1 Procedure G-CodesBenito Mccreedy OTR/L [id number] 12/16/2014, 4:46 PM

## 2014-12-16 NOTE — Evaluation (Signed)
Physical Therapy Evaluation Patient Details Name: Mackenzie [name] MRN: [id number] DOB: 15-Mar-1940 Today's Date: 12/16/2014   History of Present Illness  Patient is a 74 y/o female s/p R THA, direct anterior approach. PMH includes HTN, HLD, anxiety, depression, CHF, cataracts.  Clinical Impression  Patient presents with pain, lethargy and post surgical deficits RLE s/p R THA. Tolerated SPT from bed to/from [facility] with min A for balance/safety. Declined sitting in chair as pt with pain and lethargic. Instructed pt in exercises. Pt reports her friend will be staying with her a d/c. Will follow acutely to maximize independence and mobility prior to return home.    Follow Up Recommendations Home health PT;Supervision/Assistance - 24 hour    Equipment Recommendations  Rolling walker with 5" wheels    Recommendations for Other Services OT consult     Precautions / Restrictions Precautions Precautions: Fall Precaution Comments: direct anterior approach Restrictions Weight Bearing Restrictions: Yes RLE Weight Bearing: Weight bearing as tolerated      Mobility  Bed Mobility Overal bed mobility: Needs Assistance Bed Mobility: Supine to Sit;Sit to Supine     Supine to sit: Mod assist;HOB elevated Sit to supine: Min assist;HOB elevated   General bed mobility comments: Assist to bring RLE to EOB and elevate trunk. Assist to bring RLE into bed.  Transfers Overall transfer level: Needs assistance Equipment used: Rolling walker (2 wheeled) Transfers: Sit to/from [facility] Sit to Stand: Min assist Stand pivot transfers: Min assist       General transfer comment: Min A to boost from EOB x1, from [facility] x1 with cues for hand placement. Able to take a few pivotal steps bed to/from [facility]. Difficulty advancing RLE.  Ambulation/Gait                Stairs            Wheelchair Mobility    Modified Rankin (Stroke Patients Only)       Balance Overall balance  assessment: Needs assistance Sitting-balance support: Feet supported;No upper extremity supported Sitting balance-Leahy Scale: Fair     Standing balance support: During functional activity Standing balance-Leahy Scale: Poor Standing balance comment: Relient on RW for support.                             Pertinent Vitals/Pain Pain Assessment: Faces Faces Pain Scale: Hurts even more Pain Location: right hip with mobility. Pain Descriptors / Indicators: Sore;Aching Pain Intervention(s): Monitored during session;Repositioned;Premedicated before session;Ice applied    Home Living Family/patient expects to be discharged to:: Private residence Living Arrangements: Spouse/significant other (friend of 80 years ) Available Help at Discharge: Friend(s);Available 24 hours/day Type of Home: House Home Access: Stairs to enter Entrance Stairs-Rails: Right Entrance Stairs-Number of Steps: 3 Home Layout: One level Home Equipment: None      Prior Function Level of Independence: Independent         Comments: Friend of 44 years will be staying with pt at discharge.     Hand Dominance        Extremity/Trunk Assessment   Upper Extremity Assessment: Defer to OT evaluation           Lower Extremity Assessment: RLE deficits/detail RLE Deficits / Details: Limited AROM/strength secondary to pain/surgery. Able to perform LAQ. Difficulty with hip abd/add.       Communication   Communication: No difficulties  Cognition Arousal/Alertness: Lethargic;Suspect due to medications Behavior During Therapy: [facility] for tasks assessed/performed  Overall Cognitive Status: Within Functional Limits for tasks assessed                      General Comments General comments (skin integrity, edema, etc.): Son present during session.    Exercises Total Joint Exercises Ankle Circles/Pumps: Both;10 reps;Supine Quad Sets: Both;10 reps;Supine Gluteal Sets: Both;10 reps;Supine       Assessment/Plan    PT Assessment Patient needs continued PT services  PT Diagnosis Acute pain;Difficulty walking   PT Problem List Decreased strength;Pain;Decreased range of motion;Decreased activity tolerance;Decreased balance;Decreased mobility  PT Treatment Interventions Balance training;Gait training;Stair training;Functional mobility training;Therapeutic activities;Therapeutic exercise;Patient/family education   PT Goals (Current goals can be found in the Care Plan section) Acute Rehab PT Goals Patient Stated Goal: to get back to life PT Goal Formulation: With patient Time For Goal Achievement: 12/30/14 Potential to Achieve Goals: Good    Frequency 7X/week   Barriers to discharge        Co-evaluation               End of Session Equipment Utilized During Treatment: Gait belt Activity Tolerance: Patient tolerated treatment well;Patient limited by lethargy;Patient limited by pain Patient left: in bed;with call bell/phone within reach;with bed alarm set;with family/visitor present;with SCD's reapplied Nurse Communication: Mobility status         Time: 8325-4982 PT Time Calculation (min) (ACUTE ONLY): 26 min   Charges:   PT Evaluation $Initial PT Evaluation Tier I: 1 Procedure PT Treatments $Therapeutic Activity: 8-22 mins   PT G Codes:        Audre Cenci A Saleema Weppler 12/16/2014, 2:45 PM Wray Kearns, [place], DPT [phone]

## 2014-12-16 NOTE — Progress Notes (Signed)
Utilization review completed.  

## 2014-12-16 NOTE — Op Note (Signed)
12/16/2014  9:30 AM  PATIENT:  Mackenzie [name]   MRN: [id number]  PRE-OPERATIVE DIAGNOSIS:  OA RIGHT HIP  POST-OPERATIVE DIAGNOSIS:  OA RIGHT HIP  PROCEDURE:  Procedure(s): TOTAL HIP ARTHROPLASTY ANTERIOR APPROACH  PREOPERATIVE INDICATIONS:    Mackenzie [name] is an 74 y.o. female who has a diagnosis of <principal problem not specified> and elected for surgical management after failing conservative treatment.  The risks benefits and alternatives were discussed with the patient including but not limited to the risks of nonoperative treatment, versus surgical intervention including infection, bleeding, nerve injury, periprosthetic fracture, the need for revision surgery, dislocation, leg length discrepancy, blood clots, cardiopulmonary complications, morbidity, mortality, among others, and they were willing to proceed.     OPERATIVE REPORT     SURGEON:   Khya Halls, Ernesta Amble, MD    ASSISTANT:  Lovett Calender, PA-C, She was present and scrubbed throughout the case, critical for completion in a timely fashion, and for retraction, instrumentation, and closure.     ANESTHESIA:  General    COMPLICATIONS:  None.     COMPONENTS:  Stryker acolade fit femur size 3 with a 36 mm -2.5 head ball and a PSL acetabular shell size 50 with a  polyethylene liner    PROCEDURE IN DETAIL:   The patient was met in the holding area and  identified.  The appropriate hip was identified and marked at the operative site.  The patient was then transported to the OR  and  placed under general anesthesia.  At that point, the patient was  placed in the supine position and  secured to the operating room table and all bony prominences padded. He received pre-operative antibiotics    The operative lower extremity was prepped from the iliac crest to the distal leg.  Sterile draping was performed.  Time out was performed prior to incision.      Skin incision was made just 2 cm lateral to the ASIS  extending in line with the  tensor fascia lata. Electrocautery was used to control all bleeders. I dissected down sharply to the fascia of the tensor fascia lata was confirmed that the muscle fibers beneath were running posteriorly. I then incised the fascia over the superficial tensor fascia lata in line with the incision. The fascia was elevated off the anterior aspect of the muscle the muscle was retracted posteriorly and protected throughout the case. I then used electrocautery to incise the tensor fascia lata fascia control and all bleeders. Immediately visible was the fat over top of the anterior neck and capsule.  I removed the anterior fat from the capsule and elevated the rectus muscle off of the anterior capsule. I then removed a large time of capsule. The retractors were then placed over the anterior acetabulum as well as around the superior and inferior neck.  I then removed a section of the femoral neck and a napkin ring fashion. Then used the power course to remove the femoral head from the acetabulum and thoroughly irrigated the acetabulum. I sized the femoral head.    I then exposed the deep acetabulum, cleared out any tissue including the ligamentum teres.   After adequate visualization, I excised the labrum, and then sequentially reamed.  I placed the trial acetabulum, which seated nicely, and then impacted the real cup into place.  Appropriate version and inclination was confirmed clinically matching their bony anatomy, and also with the use transverse acetabular ligament.  I placed a 66m screw in the posterior/superio position  with an excellent bite.    I then placed the polyethylene liner in place  I then abducted the leg and released the external rotators from the posterior femur allowing it to be easily delivered up lateral and anterior to the acetabulum for preparation of the femoral canal.    I then prepared the proximal femur using the cookie-cutter and then sequentially reamed and broached.  A trial  broach, neck, and head was utilized, and I reduced the hip and it was found to have excellent stability with functional range of motion..  I then impacted the real femoral prosthesis into place into the appropriate version, slightly anteverted to the normal anatomy, and I impacted the real head ball into place. The hip was then reduced and taken through functional range of motion and found to have excellent stability. Leg lengths were restored.  I then irrigated the hip copiously again with, and repaired the fascia with Vicryl, followed by monocryl for the subcutaneous tissue, Monocryl for the skin, Steri-Strips and sterile gauze. The patient was then awakened and returned to PACU in stable and satisfactory condition. There were no complications.  POST OPERATIVE PLAN: WBAT, DVT px: SCD's/TED and ASA 325  Edmonia Lynch, MD Orthopedic Surgeon [phone]   This note was generated using a template and dragon dictation system. In light of that, I have reviewed the note and all aspects of it are applicable to this case. Any dictation errors are due to the computerized dictation system.

## 2014-12-16 NOTE — Interval H&P Note (Signed)
History and Physical Interval Note:  12/16/2014 7:24 AM  Mackenzie [name]  has presented today for surgery, with the diagnosis of OA RIGHT HIP  The various methods of treatment have been discussed with the patient and family. After consideration of risks, benefits and other options for treatment, the patient has consented to  Procedure(s): TOTAL HIP ARTHROPLASTY ANTERIOR APPROACH (Right) as a surgical intervention .  The patient's history has been reviewed, patient examined, no change in status, stable for surgery.  I have reviewed the patient's chart and labs.  Questions were answered to the patient's satisfaction.     Ajane Novella D

## 2014-12-16 NOTE — Transfer of Care (Signed)
Immediate Anesthesia Transfer of Care Note  Patient: Mackenzie [name]  Procedure(s) Performed: Procedure(s): TOTAL HIP ARTHROPLASTY ANTERIOR APPROACH (Right)  Patient Location: PACU  Anesthesia Type:MAC and Spinal  Level of Consciousness: awake, alert  and oriented  Airway & Oxygen Therapy: Patient Spontanous Breathing  Post-op Assessment: Report given to RN and Post -op Vital signs reviewed and stable  Post vital signs: Reviewed and stable  Last Vitals:  Filed Vitals:   12/16/14 0550  BP: 197/54  Pulse: 66  Temp: 29.0 C    Complications: No apparent anesthesia complications

## 2014-12-17 ENCOUNTER — Encounter (HOSPITAL_COMMUNITY): Payer: Self-pay | Admitting: Orthopedic Surgery

## 2014-12-17 LAB — TYPE AND SCREEN
ABO/RH(D): A POS
Antibody Screen: NEGATIVE
UNIT DIVISION: 0

## 2014-12-17 LAB — CBC
HCT: 28.8 % — ABNORMAL LOW (ref 36.0–46.0)
Hemoglobin: 9.4 g/dL — ABNORMAL LOW (ref 12.0–15.0)
MCH: 30.4 pg (ref 26.0–34.0)
MCHC: 32.6 g/dL (ref 30.0–36.0)
MCV: 93.2 fL (ref 78.0–100.0)
PLATELETS: 252 10*3/uL (ref 150–400)
RBC: 3.09 MIL/uL — AB (ref 3.87–5.11)
RDW: 16.8 % — ABNORMAL HIGH (ref 11.5–15.5)
WBC: 8.5 10*3/uL (ref 4.0–10.5)

## 2014-12-17 NOTE — Progress Notes (Signed)
     Subjective:  POD#1 RATH. Patient reports pain as mild to moderate.  Resting comfortably in bed this morning.  Patient was given 1 unit of blood yesterday for a drop in hemoglobin from 11.8 to 7.8 post-op.  Repeat CBC pending. Was able to tolerate transfers with PT yesterday.  Will see how mobilization goes today.  Expect that she will be ready for discharge this afternoon after two rounds of PT.   Objective:   VITALS:   Filed Vitals:   12/16/14 1239 12/16/14 1257 12/16/14 1947 12/17/14 0121  BP:  163/64 162/44 143/42  Pulse:   70 80  Temp: 97.9 F (36.6 C) 98 F (36.7 C) 98 F (36.7 C) 99.3 F (37.4 C)  TempSrc:  Oral Oral Oral  Resp:  16 16 16   Weight:      SpO2:  98% 100% 93%    Neurologically intact ABD soft Neurovascular intact Sensation intact distally Intact pulses distally Dorsiflexion/Plantar flexion intact Incision: dressing C/D/I   Lab Results  Component Value Date   WBC 7.4 12/04/2014   HGB 7.8* 12/16/2014   HCT 23.0* 12/16/2014   MCV 96.1 12/04/2014   PLT 311 12/04/2014   BMET    Component Value Date/Time   NA 141 12/16/2014 0921   K 4.4 12/16/2014 0921   CL 106 12/04/2014 1128   CO2 28 12/04/2014 1128   GLUCOSE 110* 12/16/2014 0921   BUN 18 12/04/2014 1128   CREATININE 0.77 12/04/2014 1128   CALCIUM 9.9 12/04/2014 1128   GFRNONAA >60 12/04/2014 1128   GFRAA >60 12/04/2014 1128     Assessment/Plan: 1 Day Post-Op   Principal Problem:   Primary osteoarthritis of right hip Active Problems:   Postoperative anemia due to acute blood loss   Up with therapy WBAT in the RLE ASA 325mg  daily for DVT prophylaxis Expect that the patient will be ready for discharge later today after working with PT on mobilization and stair training.   Mackenzie [name] 12/17/2014, 6:41 AM Cell [phone]

## 2014-12-17 NOTE — Progress Notes (Signed)
Physical Therapy Treatment Patient Details Name: Mackenzie [name] MRN: [id number] DOB: [birth year] Today's Date: 12/17/2014    History of Present Illness Patient is a 74 y.o. female s/p R THA, direct anterior approach. PMH includes HTN, HLD, anxiety, depression, CHF, cataracts.    PT Comments    Pt with appropriate level of activity and independence for d/c home today. Acute goals met and ready for HHPT to progress mobility further. Pt will have assistance at home. PT signing off.   Follow Up Recommendations  Home health PT;Supervision/Assistance - 24 hour     Equipment Recommendations  Rolling walker with 5" wheels    Recommendations for Other Services       Precautions / Restrictions Precautions Precautions: Fall Precaution Comments: direct anterior approach Restrictions Weight Bearing Restrictions: Yes RLE Weight Bearing: Weight bearing as tolerated    Mobility  Bed Mobility Overal bed mobility: Modified Independent Bed Mobility: Supine to Sit;Sit to Supine     Supine to sit: Modified independent (Device/Increase time) Sit to supine: Modified independent (Device/Increase time)   General bed mobility comments: pt able to use UE's to assist LE's, will have assist at home as well if she needs it  Transfers Overall transfer level: Modified independent Equipment used: Rolling walker (2 wheeled) Transfers: Sit to/from Stand Sit to Stand: Modified independent (Device/Increase time)         General transfer comment: discussed consistent use of RW, even for transfers or short distances  Ambulation/Gait Ambulation/Gait assistance: Modified independent (Device/Increase time) Ambulation Distance (Feet): 125 Feet Assistive device: Rolling walker (2 wheeled) Gait Pattern/deviations: Step-through pattern;Decreased weight shift to right;Decreased step length - right;Decreased stance time - right Gait velocity: decreased Gait velocity interpretation: Below normal speed for  age/gender General Gait Details: pt continues to have burning right thigh though better controlled this afternoon with pain meds. Pt tends to toe out right footm, discussed this and pt able to correct. Also worked on posture and shoulders down   [employer] Rankin (Stroke Patients Only)       Balance Overall balance assessment: Modified Independent                                  Cognition Arousal/Alertness: Awake/alert Behavior During Therapy: WFL for tasks assessed/performed Overall Cognitive Status: Within Functional Limits for tasks assessed                      Exercises Total Joint Exercises Ankle Circles/Pumps: AROM;Both;10 reps;Seated Hip ABduction/ADduction: AROM;Right;10 reps;Standing Long Arc Quad: AROM;Right;10 reps;Seated Standing Hip Extension: AROM;Right;10 reps;Standing    General Comments General comments (skin integrity, edema, etc.): discussed car transfer and d/c issues      Pertinent Vitals/Pain Pain Assessment: Faces Faces Pain Scale: Hurts little more Pain Location: right hip Pain Descriptors / Indicators: Burning Pain Intervention(s): Limited activity within patient's tolerance;Monitored during session;Premedicated before session    Home Living                      Prior Function            PT Goals (current goals can now be found in the care plan section) Acute Rehab PT Goals Patient Stated Goal: return home, be able to go to [facility] this January PT Goal Formulation: With patient Time For Goal Achievement: 12/30/14  Potential to Achieve Goals: Good Progress towards PT goals: Goals met/education completed, patient discharged from PT    Frequency  7X/week    PT Plan Current plan remains appropriate    Co-evaluation             End of Session   Activity Tolerance: Patient tolerated treatment well Patient left: in chair;with call bell/phone within reach;with  family/visitor present     Time: 9794-8016 PT Time Calculation (min) (ACUTE ONLY): 19 min  Charges:  $Gait Training: 8-22 mins                    G Codes:     Leighton Roach, PT  Acute Rehab Services  [phone]  Leighton Roach 12/17/2014, 3:41 PM

## 2014-12-17 NOTE — Progress Notes (Signed)
Physical Therapy Treatment Patient Details Name: Mackenzie [name] MRN: [id number] DOB: 1940/11/24 Today's Date: 12/17/2014    History of Present Illness Patient is a 74 y.o. female s/p R THA, direct anterior approach. PMH includes HTN, HLD, anxiety, depression, CHF, cataracts.    PT Comments    Pt progressing with mobility, ambulated 110' with RW and supervision and performed stairs, but pt concerned because of burning in right hip that worsens with activity. Will plan to see again before d/c later today.  Follow Up Recommendations  Home health PT;Supervision/Assistance - 24 hour     Equipment Recommendations  Rolling walker with 5" wheels    Recommendations for Other Services OT consult     Precautions / Restrictions Precautions Precautions: Fall Precaution Comments: direct anterior approach Restrictions Weight Bearing Restrictions: Yes RLE Weight Bearing: Weight bearing as tolerated    Mobility  Bed Mobility Overal bed mobility: Needs Assistance Bed Mobility: Supine to Sit;Sit to Supine     Supine to sit: Supervision Sit to supine: Min assist   General bed mobility comments: placed pt's bed in flat position for her to practice, was unable to rise without use of rail but with rail (she has bedside table at home), was able to get to EOB without physical assist. Needed min A to RLE for return to supine  Transfers Overall transfer level: Needs assistance Equipment used: Rolling walker (2 wheeled) Transfers: Sit to/from Stand Sit to Stand: Supervision;Modified independent (Device/Increase time) Stand pivot transfers: Min guard       General transfer comment: supervision for first transfer and then pt performed several at mod I level from recliner and bed  Ambulation/Gait Ambulation/Gait assistance: Supervision Ambulation Distance (Feet): 110 Feet Assistive device: Rolling walker (2 wheeled) Gait Pattern/deviations: Step-through pattern;Decreased stride length;Decreased  weight shift to right;Decreased stance time - right Gait velocity: decreased Gait velocity interpretation: <1.8 ft/sec, indicative of risk for recurrent falls General Gait Details: pt reports burning right hip with increased distance, antalgic gait noted, needed to sit after 110'.    Stairs Stairs: Yes Stairs assistance: Min guard Stair Management: One rail Left;Step to pattern;Forwards Number of Stairs: 5 General stair comments: vc's for sequencing, stairs painful but pt safe with min-guard A.   Wheelchair Mobility    Modified Rankin (Stroke Patients Only)       Balance Overall balance assessment: Needs assistance Sitting-balance support: No upper extremity supported Sitting balance-Leahy Scale: Good     Standing balance support: No upper extremity supported Standing balance-Leahy Scale: Fair Standing balance comment: able to stand without support and perform activities within her BOS                    Cognition Arousal/Alertness: Awake/alert Behavior During Therapy: WFL for tasks assessed/performed Overall Cognitive Status: Within Functional Limits for tasks assessed                      Exercises Total Joint Exercises Ankle Circles/Pumps: AROM;Both;10 reps;Supine Quad Sets: AROM;Right;10 reps;Supine Heel Slides: AROM;Right;10 reps;Supine Hip ABduction/ADduction: AAROM;Right;10 reps;Supine Bridges: AROM;5 reps;Supine    General Comments General comments (skin integrity, edema, etc.): discussed activity level upon d/c home      Pertinent Vitals/Pain Pain Assessment: Faces Pain Score: 6  Faces Pain Scale: Hurts even more Pain Location: right hip Pain Descriptors / Indicators: Burning Pain Intervention(s): Limited activity within patient's tolerance;Monitored during session    Home Living  Prior Function            PT Goals (current goals can now be found in the care plan section) Acute Rehab PT Goals Patient  Stated Goal: return home, be able to go to [facility] this January PT Goal Formulation: With patient Time For Goal Achievement: 12/30/14 Potential to Achieve Goals: Good Progress towards PT goals: Progressing toward goals    Frequency  7X/week    PT Plan Current plan remains appropriate    Co-evaluation             End of Session Equipment Utilized During Treatment: Gait belt Activity Tolerance: Patient tolerated treatment well Patient left: in bed;with call bell/phone within reach     Time: 0918-0949 PT Time Calculation (min) (ACUTE ONLY): 31 min  Charges:  $Gait Training: 8-22 mins $Therapeutic Activity: 8-22 mins                    G Codes:     Leighton Roach, PT  Acute Rehab Services  [phone]  Leighton Roach 12/17/2014, 10:23 AM

## 2014-12-17 NOTE — Care Management Note (Signed)
Case Management Note  Patient Details  Name: Mackenzie [name] MRN: [id number] Date of Birth: 1940-06-24  Subjective/Objective:   74 yr old female s/p right total hip arthroplasty.                 Action/Plan: Patient was preoperatively setup with [facility], no changes. Has family support at discharge.   Expected Discharge Date:    12/17/14              Expected Discharge Plan:  [place]  In-House Referral:     Discharge planning Services  CM Consult  Post Acute Care Choice:  Durable Medical Equipment, Home Health Choice offered to:  Patient  DME Arranged:  3-N-1, Walker rolling DME Agency:  [place]:  PT [place]:  [place]  Status of Service:  Completed, signed off  Medicare Important Message Given:    Date Medicare IM Given:    Medicare IM give by:    Date Additional Medicare IM Given:    Additional Medicare Important Message give by:     If discussed at [place] of Stay Meetings, dates discussed:    Additional Comments:  Ninfa Meeker, RN 12/17/2014, 3:04 PM

## 2014-12-17 NOTE — Discharge Summary (Signed)
Physician Discharge Summary  Patient ID: Mackenzie [name] MRN: [id number] DOB/AGE: 1940/06/19 74 y.o.  Admit date: 12/16/2014 Discharge date: 12/17/2014  Admission Diagnoses:  Primary osteoarthritis of right hip  Discharge Diagnoses:  Principal Problem:   Primary osteoarthritis of right hip Active Problems:   Postoperative anemia due to acute blood loss   Past Medical History  Diagnosis Date  . Hyperlipidemia     takes Pravastatin daily  . CHF (congestive heart failure) ([place])   . Anxiety     takes Xanax daily as needed  . Hypertension     takes Metoprolol and Azor daily  . Depression     takes Citalopram daily  . Rheumatic fever     hx of  . Wheezing on expiration   . Pneumonia     hx of-2015  . History of bronchitis     2014  . History of migraine   . Dizziness     cardiologist is aware and told pt it was related to meds  . Arthritis   . Joint pain   . History of colon polyps     benign  . Anemia, normocytic normochromic 01/2012    hx of  . Cataract     right eye    Surgeries: Procedure(s): TOTAL HIP ARTHROPLASTY ANTERIOR APPROACH on 12/16/2014   Consultants (if any):    Discharged Condition: Improved  Hospital Course: Mackenzie [name] is an 74 y.o. female who was admitted 12/16/2014 with a diagnosis of Primary osteoarthritis of right hip and went to the operating room on 12/16/2014 and underwent the above named procedures.    She was given perioperative antibiotics:  Anti-infectives    Start     Dose/Rate Route Frequency Ordered Stop   12/16/14 1345  ceFAZolin (ANCEF) IVPB 2 g/50 mL premix     2 g 100 mL/hr over 30 Minutes Intravenous Every 6 hours 12/16/14 1318 12/17/14 0144   12/16/14 0645  ceFAZolin (ANCEF) IVPB 2 g/50 mL premix     2 g 100 mL/hr over 30 Minutes Intravenous To [facility] 12/15/14 1025 12/16/14 0740    .  She was given sequential compression devices, early ambulation, and ASA 325mg  for DVT prophylaxis.  She benefited maximally from  the hospital stay and there were no complications.    Recent vital signs:  Filed Vitals:   12/17/14 1100  BP: 143/36  Pulse: 71  Temp: 98.4 F (36.9 C)  Resp:     Recent laboratory studies:  Lab Results  Component Value Date   HGB 9.4* 12/17/2014   HGB 7.8* 12/16/2014   HGB 11.8* 12/04/2014   Lab Results  Component Value Date   WBC 8.5 12/17/2014   PLT 252 12/17/2014   Lab Results  Component Value Date   INR 1.02 12/04/2014   Lab Results  Component Value Date   NA 141 12/16/2014   K 4.4 12/16/2014   CL 106 12/04/2014   CO2 28 12/04/2014   BUN 18 12/04/2014   CREATININE 0.77 12/04/2014   GLUCOSE 110* 12/16/2014    Discharge Medications:     Medication List    TAKE these medications        ALPRAZolam 0.5 MG tablet  Commonly known as:  XANAX  Take 0.5 mg by mouth 2 (two) times daily as needed. Anxiety/sleep aid     aspirin 325 MG tablet  Take 1 tablet (325 mg total) by mouth daily.     AZOR 5-40 MG tablet  Generic drug:  amLODipine-olmesartan  Take 1 tablet by mouth at bedtime.     citalopram 10 MG tablet  Commonly known as:  CELEXA  Take 10 mg by mouth daily.     docusate sodium 100 MG capsule  Commonly known as:  COLACE  Take 1 capsule (100 mg total) by mouth 2 (two) times daily.     HYDROcodone-acetaminophen 5-325 MG tablet  Commonly known as:  NORCO  Take 1-2 tablets by mouth every 6 (six) hours as needed for moderate pain.     methocarbamol 500 MG tablet  Commonly known as:  ROBAXIN  Take 1 tablet (500 mg total) by mouth 4 (four) times daily.     metoprolol succinate 25 MG 24 hr tablet  Commonly known as:  TOPROL-XL  Take 25 mg by mouth daily.     multivitamin with minerals Tabs tablet  Take 1 tablet by mouth daily.     ondansetron 4 MG tablet  Commonly known as:  ZOFRAN  Take 1 tablet (4 mg total) by mouth every 8 (eight) hours as needed for nausea or vomiting.     pravastatin 40 MG tablet  Commonly known as:  PRAVACHOL  Take 40  mg by mouth 3 (three) times a week.     VITAMIN B-12 IJ  Inject 1 mL as directed every 30 (thirty) days.        Diagnostic Studies: Dg Chest 2 View  12/04/2014  CLINICAL DATA:  Pre admit for rt total hip surgery. Hypertension. Wheezing on expiration. EXAM: CHEST  2 VIEW COMPARISON:  06/19/2012 FINDINGS: Heart size is normal. Lungs are hyperinflated. Right lung granuloma again noted. There are no focal consolidations or pleural effusions. Deformities of old right rib fractures noted. IMPRESSION: No evidence for acute cardiopulmonary abnormality. Electronically Signed   By: Nolon Nations M.D.   On: 12/04/2014 15:04   Dg Hip Operative Unilat With Pelvis Right  12/16/2014  CLINICAL DATA:  74 year old female status post right anterior total hip arthroplasty. EXAM: OPERATIVE RIGHT HIP (WITH PELVIS IF PERFORMED) 2 VIEWS TECHNIQUE: Fluoroscopic spot image(s) were submitted for interpretation post-operatively. COMPARISON:  05/30/2014 MR FINDINGS: Two intraoperative views of the right hip are submitted postoperatively for interpretation. Right total hip arthroplasty identified. No definite complicating features are noted. IMPRESSION: Right total hip arthroplasty without definite complicating features. Electronically Signed   By: Margarette Canada M.D.   On: 12/16/2014 10:17    Disposition: 01-Home or Self Care      Discharge Instructions    Weight bearing as tolerated    Complete by:  As directed   Laterality:  right  Extremity:  Lower           Follow-up Information    Follow up with MURPHY, TIMOTHY D, MD In 10 days.   Specialty:  Orthopedic Surgery   Contact information:   [address] [place] [address] [phone]        Signed: Gae Dry 12/17/2014, 11:50 AM Cell [phone]

## 2014-12-17 NOTE — Progress Notes (Signed)
Occupational Therapy Treatment Patient Details Name: Mackenzie [name] MRN: [id number] DOB: 06/24/1940 Today's Date: 12/17/2014    History of present illness Patient is a 74 y.o. female s/p R THA, direct anterior approach. PMH includes HTN, HLD, anxiety, depression, CHF, cataracts.   OT comments  Pt tolerated session well and is motivated for discharge home. OT educated pt on purse lip breathing as she was observed to be holding breath with ambulation in anticipation of pain. Pt seated in recliner chair at end of session with call bell and all needed items within reach upon exiting the room.   Follow Up Recommendations  No OT follow up;Supervision - Intermittent    Equipment Recommendations  3 in 1 bedside comode    Recommendations for Other Services     Precautions / Restrictions Precautions Precautions: Fall Precaution Comments: direct anterior approach Restrictions Weight Bearing Restrictions: Yes RLE Weight Bearing: Weight bearing as tolerated       Mobility Bed Mobility   General bed mobility comments: pt seated in recliner chair upon entering the room.   Transfers Overall transfer level: Needs assistance Equipment used: Rolling walker (2 wheeled) Transfers: Sit to/from [facility] Sit to Stand: Min guard Stand pivot transfers: Min guard       General transfer comment: Min A boost from recliner chair x 1,     Balance Overall balance assessment: Needs assistance Sitting-balance support: No upper extremity supported;Feet supported Sitting balance-Leahy Scale: Good     Standing balance support: During functional activity;No upper extremity supported Standing balance-Leahy Scale: Fair Standing balance comment: supervision - steady assist when standing at sink  to wash B hands        ADL Overall ADL's : Needs assistance/impaired           General ADL Comments: Upon entering the room, pt seated in recliner chair and requesting to use bathroom. Pt  performed sit <>stand with min A from recliner chair. Pt ambulating ~ 10' with RW and steady assist for safety. Pt transferring onto elevated toilet seated with OT educating pt on recommendation for 3 in 1 for comfort. Pt verbalized understanding and agreement. Pt performing clothing management and hygiene with steady assist. Pt standing at sink to wash hands with  close supervision - steady assist for balance. Pt ambulating in same manner to return to recliner chair . B LEs elevated with call bell and all needed items within reach. OT educated and demonstrated use of AE in order to increase I in LB self care such as reacher, sock aide, shoe horn, and LH sponge. Pt verbalized her friend owns this equipment and will bring it to her home.                 Cognition   Behavior During Therapy: WFL for tasks assessed/performed Overall Cognitive Status: Within Functional Limits for tasks assessed                                    Pertinent Vitals/ Pain       Pain Assessment: 0-10 Pain Score: 6  Pain Location: R LE Pain Descriptors / Indicators: Aching;Burning Pain Intervention(s): Monitored during session;Repositioned         Frequency Min 2X/week     Progress Toward Goals  OT Goals(current goals can now be found in the care plan section)  Progress towards OT goals: Progressing toward goals     Plan  Discharge plan remains appropriate       End of Session Equipment Utilized During Treatment: Rolling walker   Activity Tolerance Patient tolerated treatment well   Patient Left with call bell/phone within reach;in chair           Time: 3128-1188 OT Time Calculation (min): 26 min  Charges: OT General Charges $OT Visit: 1 Procedure OT Treatments $Self Care/Home Management : 23-37 mins  Phineas Semen, MS, OTR/L 12/17/2014, 9:14 AM

## 2014-12-17 NOTE — Anesthesia Postprocedure Evaluation (Signed)
  Anesthesia Post-op Note  Patient: Mackenzie [name]  Procedure(s) Performed: Procedure(s): TOTAL HIP ARTHROPLASTY ANTERIOR APPROACH (Right)  Patient Location: PACU  Anesthesia Type:Spinal  Level of Consciousness: awake  Airway and Oxygen Therapy: Patient Spontanous Breathing  Post-op Pain: mild  Post-op Assessment: Post-op Vital signs reviewed, Patient's Cardiovascular Status Stable, Respiratory Function Stable, Patent Airway, No signs of Nausea or vomiting and Pain level controlled LLE Motor Response: Purposeful movement, Responds to commands LLE Sensation: Full sensation, No numbness, No pain, No tingling RLE Motor Response: Purposeful movement, Responds to commands RLE Sensation: Pain, Full sensation, No numbness, No tingling (Pain controlled with prescribed pain regimen) L Sensory Level: L5-Outer lower leg, top of foot, great toe R Sensory Level: L5-Outer lower leg, top of foot, great toe  Post-op Vital Signs: Reviewed and stable  Last Vitals:  Filed Vitals:   12/17/14 1100  BP: 143/36  Pulse: 71  Temp: 36.9 C  Resp:     Complications: No apparent anesthesia complications

## 2014-12-17 NOTE — Anesthesia Procedure Notes (Signed)
Spinal Patient location during procedure: OR Staffing Anesthesiologist: Gitty Osterlund Preanesthetic Checklist Completed: patient identified, surgical consent, pre-op evaluation, timeout performed, IV checked, risks and benefits discussed and monitors and equipment checked Spinal Block Patient position: sitting Prep: site prepped and draped and DuraPrep Patient monitoring: heart rate, cardiac monitor, continuous pulse ox and blood pressure Approach: right paramedian Location: L3-4 Injection technique: single-shot Needle Needle type: Pencan  Needle gauge: 24 G Needle length: 10 cm Assessment Sensory level: T6

## 2014-12-18 DIAGNOSIS — Z471 Aftercare following joint replacement surgery: Secondary | ICD-10-CM | POA: Diagnosis not present

## 2014-12-18 DIAGNOSIS — Z87891 Personal history of nicotine dependence: Secondary | ICD-10-CM | POA: Diagnosis not present

## 2014-12-18 DIAGNOSIS — Z96641 Presence of right artificial hip joint: Secondary | ICD-10-CM | POA: Diagnosis not present

## 2014-12-18 DIAGNOSIS — I509 Heart failure, unspecified: Secondary | ICD-10-CM | POA: Diagnosis not present

## 2014-12-18 DIAGNOSIS — E785 Hyperlipidemia, unspecified: Secondary | ICD-10-CM | POA: Diagnosis not present

## 2014-12-18 DIAGNOSIS — I10 Essential (primary) hypertension: Secondary | ICD-10-CM | POA: Diagnosis not present

## 2014-12-18 DIAGNOSIS — I2510 Atherosclerotic heart disease of native coronary artery without angina pectoris: Secondary | ICD-10-CM | POA: Diagnosis not present

## 2014-12-18 DIAGNOSIS — F329 Major depressive disorder, single episode, unspecified: Secondary | ICD-10-CM | POA: Diagnosis not present

## 2014-12-18 DIAGNOSIS — D649 Anemia, unspecified: Secondary | ICD-10-CM | POA: Diagnosis not present

## 2014-12-18 DIAGNOSIS — F419 Anxiety disorder, unspecified: Secondary | ICD-10-CM | POA: Diagnosis not present

## 2014-12-19 DIAGNOSIS — D649 Anemia, unspecified: Secondary | ICD-10-CM | POA: Diagnosis not present

## 2014-12-19 DIAGNOSIS — I2510 Atherosclerotic heart disease of native coronary artery without angina pectoris: Secondary | ICD-10-CM | POA: Diagnosis not present

## 2014-12-19 DIAGNOSIS — I10 Essential (primary) hypertension: Secondary | ICD-10-CM | POA: Diagnosis not present

## 2014-12-19 DIAGNOSIS — I509 Heart failure, unspecified: Secondary | ICD-10-CM | POA: Diagnosis not present

## 2014-12-19 DIAGNOSIS — Z96641 Presence of right artificial hip joint: Secondary | ICD-10-CM | POA: Diagnosis not present

## 2014-12-19 DIAGNOSIS — Z471 Aftercare following joint replacement surgery: Secondary | ICD-10-CM | POA: Diagnosis not present

## 2014-12-22 DIAGNOSIS — Z471 Aftercare following joint replacement surgery: Secondary | ICD-10-CM | POA: Diagnosis not present

## 2014-12-22 DIAGNOSIS — I2510 Atherosclerotic heart disease of native coronary artery without angina pectoris: Secondary | ICD-10-CM | POA: Diagnosis not present

## 2014-12-22 DIAGNOSIS — Z96641 Presence of right artificial hip joint: Secondary | ICD-10-CM | POA: Diagnosis not present

## 2014-12-22 DIAGNOSIS — D649 Anemia, unspecified: Secondary | ICD-10-CM | POA: Diagnosis not present

## 2014-12-22 DIAGNOSIS — I509 Heart failure, unspecified: Secondary | ICD-10-CM | POA: Diagnosis not present

## 2014-12-22 DIAGNOSIS — I10 Essential (primary) hypertension: Secondary | ICD-10-CM | POA: Diagnosis not present

## 2014-12-23 DIAGNOSIS — D649 Anemia, unspecified: Secondary | ICD-10-CM | POA: Diagnosis not present

## 2014-12-23 DIAGNOSIS — Z471 Aftercare following joint replacement surgery: Secondary | ICD-10-CM | POA: Diagnosis not present

## 2014-12-23 DIAGNOSIS — Z96641 Presence of right artificial hip joint: Secondary | ICD-10-CM | POA: Diagnosis not present

## 2014-12-23 DIAGNOSIS — I2510 Atherosclerotic heart disease of native coronary artery without angina pectoris: Secondary | ICD-10-CM | POA: Diagnosis not present

## 2014-12-23 DIAGNOSIS — I509 Heart failure, unspecified: Secondary | ICD-10-CM | POA: Diagnosis not present

## 2014-12-23 DIAGNOSIS — I10 Essential (primary) hypertension: Secondary | ICD-10-CM | POA: Diagnosis not present

## 2014-12-25 DIAGNOSIS — I2510 Atherosclerotic heart disease of native coronary artery without angina pectoris: Secondary | ICD-10-CM | POA: Diagnosis not present

## 2014-12-25 DIAGNOSIS — D649 Anemia, unspecified: Secondary | ICD-10-CM | POA: Diagnosis not present

## 2014-12-25 DIAGNOSIS — I509 Heart failure, unspecified: Secondary | ICD-10-CM | POA: Diagnosis not present

## 2014-12-25 DIAGNOSIS — Z471 Aftercare following joint replacement surgery: Secondary | ICD-10-CM | POA: Diagnosis not present

## 2014-12-25 DIAGNOSIS — I10 Essential (primary) hypertension: Secondary | ICD-10-CM | POA: Diagnosis not present

## 2014-12-25 DIAGNOSIS — Z96641 Presence of right artificial hip joint: Secondary | ICD-10-CM | POA: Diagnosis not present

## 2014-12-29 DIAGNOSIS — Z96641 Presence of right artificial hip joint: Secondary | ICD-10-CM | POA: Diagnosis not present

## 2014-12-29 DIAGNOSIS — I509 Heart failure, unspecified: Secondary | ICD-10-CM | POA: Diagnosis not present

## 2014-12-29 DIAGNOSIS — D649 Anemia, unspecified: Secondary | ICD-10-CM | POA: Diagnosis not present

## 2014-12-29 DIAGNOSIS — I10 Essential (primary) hypertension: Secondary | ICD-10-CM | POA: Diagnosis not present

## 2014-12-29 DIAGNOSIS — I2510 Atherosclerotic heart disease of native coronary artery without angina pectoris: Secondary | ICD-10-CM | POA: Diagnosis not present

## 2014-12-29 DIAGNOSIS — Z471 Aftercare following joint replacement surgery: Secondary | ICD-10-CM | POA: Diagnosis not present

## 2014-12-31 DIAGNOSIS — Z96641 Presence of right artificial hip joint: Secondary | ICD-10-CM | POA: Diagnosis not present

## 2015-01-01 ENCOUNTER — Ambulatory Visit (HOSPITAL_COMMUNITY): Payer: Medicare Other | Attending: Orthopedic Surgery | Admitting: Physical Therapy

## 2015-01-01 DIAGNOSIS — R262 Difficulty in walking, not elsewhere classified: Secondary | ICD-10-CM | POA: Insufficient documentation

## 2015-01-01 DIAGNOSIS — Z9660 Presence of unspecified orthopedic joint implant: Secondary | ICD-10-CM | POA: Insufficient documentation

## 2015-01-01 DIAGNOSIS — Z96641 Presence of right artificial hip joint: Secondary | ICD-10-CM

## 2015-01-01 DIAGNOSIS — M6281 Muscle weakness (generalized): Secondary | ICD-10-CM | POA: Diagnosis not present

## 2015-01-01 DIAGNOSIS — M25551 Pain in right hip: Secondary | ICD-10-CM | POA: Diagnosis not present

## 2015-01-01 DIAGNOSIS — R2681 Unsteadiness on feet: Secondary | ICD-10-CM | POA: Insufficient documentation

## 2015-01-01 NOTE — Therapy (Signed)
[place], [name], [address] Phone: [phone]   Fax:  [phone]  Physical Therapy Evaluation  Patient Details  Name: Mackenzie [name] MRN: [id number] Date of Birth: 1940/11/08 Referring Provider: Elsie Saas MD   Encounter Date: 01/01/2015      PT End of Session - 01/01/15 1521    Visit Number 1   Number of Visits 18   Date for PT Re-Evaluation 01/29/15   Authorization Type Medicare, [place], [employer]    Authorization Time Period 01/01/15 to 03/03/15   Authorization - Visit Number 1   Authorization - Number of Visits 10   PT Start Time [id number]   PT Stop Time 1510   PT Time Calculation (min) 38 min   Activity Tolerance Patient tolerated treatment well   Behavior During Therapy [facility] for tasks assessed/performed      Past Medical History  Diagnosis Date  . Hyperlipidemia     takes Pravastatin daily  . CHF (congestive heart failure) ([place])   . Anxiety     takes Xanax daily as needed  . Hypertension     takes Metoprolol and Azor daily  . Depression     takes Citalopram daily  . Rheumatic fever     hx of  . Wheezing on expiration   . Pneumonia     hx of-2015  . History of bronchitis     2014  . History of migraine   . Dizziness     cardiologist is aware and told pt it was related to meds  . Arthritis   . Joint pain   . History of colon polyps     benign  . Anemia, normocytic normochromic 01/2012    hx of  . Cataract     right eye    Past Surgical History  Procedure Laterality Date  . Cholecystectomy    . Abdominal hysterectomy    . Colonoscopy  2005    Negative screening study  . Colonoscopy N/A 04/04/2014    Procedure: COLONOSCOPY;  Surgeon: Rogene Houston, MD;  [facility];  Service: Endoscopy;  Laterality: N/A;  1225  . Exploratory laparotomy      x 3  . Cardiac catheterization      early 2000's  . Appendectomy    . Cataract surgery Left   . Total hip arthroplasty Right  12/16/2014    Procedure: TOTAL HIP ARTHROPLASTY ANTERIOR APPROACH;  Surgeon: Renette Butters, MD;  Location: [place];  Service: Orthopedics;  Laterality: Right;    There were no vitals filed for this visit.  Visit Diagnosis:  Status post right hip replacement - Plan: PT plan of care cert/re-cert  Right hip pain - Plan: PT plan of care cert/re-cert  Muscle weakness - Plan: PT plan of care cert/re-cert  Difficulty walking - Plan: PT plan of care cert/re-cert  Unsteadiness - Plan: PT plan of care cert/re-cert      Subjective Assessment - 01/01/15 1436    Subjective Hip is fairly stiff feeling, has to stand for a few minutes before she can move around. Not too painful but there is some.    Pertinent History Patient had her R hip replaced by anterior approach on November 1st; patient received HHPT after surgery and has been discharged from their services. Arrived using rolling walker today.    How long can you sit comfortably? not really an issue right now    How long can you stand comfortably?  immediate discomfort    How long can you walk comfortably? 60 minutes at longest but very uncomfortable    Patient Stated Goals get back to PLOF    Pain Score 3    Pain Location Hip   Pain Orientation Right            [facility] PT Assessment - 01/01/15 0001    Assessment   Medical Diagnosis R anterior hip replacement    Referring Provider Elsie Saas MD    Onset Date/Surgical Date 12/16/14   Next MD Visit In 4 weeks with [facility]    Precautions   Precautions Anterior Hip   Precaution Comments R    Restrictions   Weight Bearing Restrictions No   Balance Screen   Has the patient fallen in the past 6 months No   Has the patient had a decrease in activity level because of a fear of falling?  Yes   Is the patient reluctant to leave their home because of a fear of falling?  Yes   Prior Function   Level of Independence Independent;Independent with basic ADLs;Independent with  gait;Independent with transfers   Vocation Retired   Leisure travelling, no real other hobbies besides hanging out with friends    Observation/Other Assessments   Focus on Therapeutic Outcomes (FOTO)  79% limited    Posture/Postural Control   Posture Comments slight hip flexion, reduced weight beraing R LE    Strength   Right Hip Flexion 2/5   Right Hip ABduction 2/5   Left Hip Flexion 4-/5   Right Knee Flexion 3/5  pain limited    Right Knee Extension 3/5  pain limited    Left Knee Flexion 4/5   Right Ankle Dorsiflexion 3-/5  limited by tightness and pain, pulling down R LE    Left Ankle Dorsiflexion 5/5   Transfers   Five time sit to stand comments  19 seconds    Ambulation/Gait   Gait Comments reduced gait speed, reduced rotation of trunk and pelvis, reduced ankle DF R, use of RW   6 minute walk test results    Aerobic Endurance Distance Walked 678   Endurance additional comments gait speed 0.36m/s with walker    Timed Up and Go Test   TUG Comments 16.3, 14.9, 13.6                            PT Education - 01/01/15 1520    Education provided Yes   Education Details prognosis, plan of care, encouraged to keep up with HHPT HEP but increase consistency of performance    Person(s) Educated Patient   Methods Explanation   Comprehension Verbalized understanding          PT Short Term Goals - 01/01/15 1532    PT SHORT TERM GOAL #1   Title Patient will experience no more than 1/10 pain in her R hip when performing functional standing and ambulation based tasks for at least 60 minutes in order to increase independence and overall mobilty    Time 3   Period Weeks   Status New   PT SHORT TERM GOAL #2   Title Patient to be able to ambualate safely with single point cane with good sequencing, minimal unsteadiness, and good posture in order to enhance independence and mobility    Time 3   Period Weeks   Status New   PT SHORT TERM GOAL #3   Title Patient  to be able to peform five times sit to stand in less than 12 seconds in order to demonstrate improved muscle power and reduced overall fall risk    Time 3   Period Weeks   Status New   PT SHORT TERM GOAL #4   Title Patient to be independent in correctly and consistently performing appropriate HEP, to be updated PRN    Time 3   Period Weeks   Status New           PT Long Term Goals - 01-17-15 1536    PT LONG TERM GOAL #1   Title Patient to demonstrate at least 4+/5 strength in all tested muscle groups in order to reduce pain, improve mobility, and enhance overall functional stability to reduce fall risk    Time 6   Period Weeks   Status New   PT LONG TERM GOAL #2   Title Patient will be able to ambulate at least 1369ft on 6MWT with no assistive device, minimal unsteadiness, and no fatigue in order to demonstrate ability to safely navigate through community without assistive device and with overall reduced fall risk    Time 6   Period Weeks   Status New   PT LONG TERM GOAL #3   Title Patient will be able to perform TUG in 9 seconds or less with no assistive device in order to demonstrate improved overall mobiltiy and greatly reduced fall risk    Time 6   Period Weeks   Status New   PT LONG TERM GOAL #4   Title Patient to report that she has been able to perform at least 30 minutes of moderate level physical activity at least 3 times per week in order to maintain functional gains and facilitate improved overall health habits    Time 6   Period Weeks   Status New               Plan - 01-17-15 1524    Clinical Impression Statement Patient presents status post right anterior hip replacemnt with muscle weakness, postural and gait impairments, right hip pain, reduced functional activity tolearnce, and unsteadiness. She reports that she was relatively indepedent before surgery and would really like to get back to her PLOF. Patient at this time will benefit from skilled PT  services in order to address her functional deficits and to assist her in reaching an optimal level of function.    Pt will benefit from skilled therapeutic intervention in order to improve on the following deficits Abnormal gait;Decreased activity tolerance;Decreased strength;Pain;Decreased balance;Decreased mobility;Difficulty walking;Improper body mechanics;Decreased coordination;Impaired flexibility;Postural dysfunction   Rehab Potential Excellent   PT Frequency 3x / week   PT Duration 6 weeks   PT Treatment/Interventions ADLs/Self Care Home Management;Cryotherapy;DME Instruction;Gait training;Stair training;Functional mobility training;Therapeutic activities;Therapeutic exercise;Balance training;Neuromuscular re-education;Patient/family education;Manual techniques   PT Next Visit Plan review HEP and goals; functional strengthening and stretching within precautions, gait training   PT Home Exercise Plan encouraged patient to keep up with HHPT program    Consulted and Agree with Plan of Care Patient          G-Codes - 17-Jan-2015 1544    Functional Assessment Tool Used FOTO 79% limited    Functional Limitation Mobility: Walking and moving around   Mobility: Walking and Moving Around Current Status [id number]) At least 60 percent but less than 80 percent impaired, limited or restricted   Mobility: Walking and Moving Around Goal Status [id number]) At least 40 percent but less than  60 percent impaired, limited or restricted       Problem List Patient Active Problem List   Diagnosis Date Noted  . Primary osteoarthritis of right hip 12/16/2014  . Postoperative anemia due to acute blood loss 12/16/2014  . Bilateral carotid artery disease ([place]) 11/05/2014  . Multiple fractures of ribs of right side 06/18/2012  . Pneumothorax on right 06/18/2012  . Unstable angina ([place]) 01/31/2012  . Cerebrovascular disease 01/31/2012  . Arteriosclerotic cardiovascular disease (ASCVD)   . Hyperlipidemia   .  Hypertension   . Anemia, normocytic normochromic 01/15/2012  . MITRAL VALVE PROLAPSE 12/24/2008    Deniece Ree PT, DPT [place] [address] [place], [name], [address] Phone: [phone]   Fax:  [phone]  Name: Mackenzie [name] MRN: [id number] Date of Birth: August 28, 1940

## 2015-01-02 ENCOUNTER — Ambulatory Visit (HOSPITAL_COMMUNITY): Payer: Medicare Other

## 2015-01-02 DIAGNOSIS — R262 Difficulty in walking, not elsewhere classified: Secondary | ICD-10-CM

## 2015-01-02 DIAGNOSIS — M6281 Muscle weakness (generalized): Secondary | ICD-10-CM

## 2015-01-02 DIAGNOSIS — Z9660 Presence of unspecified orthopedic joint implant: Secondary | ICD-10-CM | POA: Diagnosis not present

## 2015-01-02 DIAGNOSIS — M25551 Pain in right hip: Secondary | ICD-10-CM

## 2015-01-02 DIAGNOSIS — R2681 Unsteadiness on feet: Secondary | ICD-10-CM

## 2015-01-02 DIAGNOSIS — Z96641 Presence of right artificial hip joint: Secondary | ICD-10-CM

## 2015-01-02 NOTE — Therapy (Signed)
[place], [name], [address] Phone: [phone]   Fax:  [phone]  Physical Therapy Treatment  Patient Details  Name: Mackenzie [name] MRN: [id number] Date of Birth: 04-09-1940 Referring Provider: Elsie Saas MD   Encounter Date: 01/02/2015      PT End of Session - 01/02/15 1157    Visit Number 2   Number of Visits 18   Date for PT Re-Evaluation 01/29/15   Authorization Type Medicare, [place], Tricare    Authorization Time Period 01/01/15 to 03/03/15   Authorization - Visit Number 2   Authorization - Number of Visits 10   PT Start Time 1150   PT Stop Time 1235   PT Time Calculation (min) 45 min   Equipment Utilized During Treatment Gait belt   Activity Tolerance Patient tolerated treatment well   Behavior During Therapy [facility] for tasks assessed/performed      Past Medical History  Diagnosis Date  . Hyperlipidemia     takes Pravastatin daily  . CHF (congestive heart failure) ([place])   . Anxiety     takes Xanax daily as needed  . Hypertension     takes Metoprolol and Azor daily  . Depression     takes Citalopram daily  . Rheumatic fever     hx of  . Wheezing on expiration   . Pneumonia     hx of-2015  . History of bronchitis     2014  . History of migraine   . Dizziness     cardiologist is aware and told pt it was related to meds  . Arthritis   . Joint pain   . History of colon polyps     benign  . Anemia, normocytic normochromic 01/2012    hx of  . Cataract     right eye    Past Surgical History  Procedure Laterality Date  . Cholecystectomy    . Abdominal hysterectomy    . Colonoscopy  2005    Negative screening study  . Colonoscopy N/A 04/04/2014    Procedure: COLONOSCOPY;  Surgeon: Rogene Houston, MD;  [facility];  Service: Endoscopy;  Laterality: N/A;  1225  . Exploratory laparotomy      x 3  . Cardiac catheterization      early 2000's  . Appendectomy    . Cataract  surgery Left   . Total hip arthroplasty Right 12/16/2014    Procedure: TOTAL HIP ARTHROPLASTY ANTERIOR APPROACH;  Surgeon: Renette Butters, MD;  Location: [place];  Service: Orthopedics;  Laterality: Right;    There were no vitals filed for this visit.  Visit Diagnosis:  Status post right hip replacement  Right hip pain  Muscle weakness  Difficulty walking  Unsteadiness      Subjective Assessment - 01/02/15 1155    Subjective Pt reports compliance with home health HEP daily, Rt hip pain scale 4/10   Currently in Pain? Yes   Pain Score 4    Pain Location Hip   Pain Orientation Right   Pain Descriptors / Indicators Sore             [facility] Adult PT Treatment/Exercise - 01/02/15 0001    Exercises   Exercises Knee/Hip   Knee/Hip Exercises: Stretches   Active Hamstring Stretch Both;2 reps;30 seconds   Active Hamstring Stretch Limitations 12in   Gastroc Stretch 3 reps;30 seconds   Gastroc Stretch Limitations slant board   Knee/Hip Exercises: Standing  Heel Raises 10 reps   Heel Raises Limitations toe raises 10x   Hip ADduction Both;10 reps   Hip Extension Both;10 reps   Extension Limitations within anterior hip precautions   Functional Squat 10 reps   Functional Squat Limitations front of mat   Gait Training Gait training with SPC with cueing for sequence and equal stride length   Knee/Hip Exercises: Supine   Bridges 5 reps   Other Supine Knee/Hip Exercises abduction 10x                PT Education - 01/01/15 1520    Education provided Yes   Education Details prognosis, plan of care, encouraged to keep up with HHPT HEP but increase consistency of performance    Person(s) Educated Patient   Methods Explanation   Comprehension Verbalized understanding          PT Short Term Goals - 01/01/15 1532    PT SHORT TERM GOAL #1   Title Patient will experience no more than 1/10 pain in her R hip when performing functional standing and ambulation based tasks  for at least 60 minutes in order to increase independence and overall mobilty    Time 3   Period Weeks   Status New   PT SHORT TERM GOAL #2   Title Patient to be able to ambualate safely with single point cane with good sequencing, minimal unsteadiness, and good posture in order to enhance independence and mobility    Time 3   Period Weeks   Status New   PT SHORT TERM GOAL #3   Title Patient to be able to peform five times sit to stand in less than 12 seconds in order to demonstrate improved muscle power and reduced overall fall risk    Time 3   Period Weeks   Status New   PT SHORT TERM GOAL #4   Title Patient to be independent in correctly and consistently performing appropriate HEP, to be updated PRN    Time 3   Period Weeks   Status New           PT Long Term Goals - 01/01/15 1536    PT LONG TERM GOAL #1   Title Patient to demonstrate at least 4+/5 strength in all tested muscle groups in order to reduce pain, improve mobility, and enhance overall functional stability to reduce fall risk    Time 6   Period Weeks   Status New   PT LONG TERM GOAL #2   Title Patient will be able to ambulate at least 1341ft on 6MWT with no assistive device, minimal unsteadiness, and no fatigue in order to demonstrate ability to safely navigate through community without assistive device and with overall reduced fall risk    Time 6   Period Weeks   Status New   PT LONG TERM GOAL #3   Title Patient will be able to perform TUG in 9 seconds or less with no assistive device in order to demonstrate improved overall mobiltiy and greatly reduced fall risk    Time 6   Period Weeks   Status New   PT LONG TERM GOAL #4   Title Patient to report that she has been able to perform at least 30 minutes of moderate level physical activity at least 3 times per week in order to maintain functional gains and facilitate improved overall health habits    Time 6   Period Weeks   Status New  Plan - 01/02/15 1256    Clinical Impression Statement Reviewed PT POC goals, compliance with HHPT HEP and copy of evaluation given to pt.  Therex focus on improving functilonal strengthening with demonstration and verbal cueing required initially for proper form and technques with exercises, pt able to demonstrate appropraite with min cueing required following.  Pt stable with RW gait, progressed to gait training with SPC with multimodal cueing required for sequence, heel to toe pattern and equalized stride length to improve mechanics.  No reports of increased pain through session.   PT Next Visit Plan Continue with gait training with SPC, strength training and manual technqiues PRN for pain.         Problem List Patient Active Problem List   Diagnosis Date Noted  . Primary osteoarthritis of right hip 12/16/2014  . Postoperative anemia due to acute blood loss 12/16/2014  . Bilateral carotid artery disease ([place]) 11/05/2014  . Multiple fractures of ribs of right side 06/18/2012  . Pneumothorax on right 06/18/2012  . Unstable angina ([place]) 01/31/2012  . Cerebrovascular disease 01/31/2012  . Arteriosclerotic cardiovascular disease (ASCVD)   . Hyperlipidemia   . Hypertension   . Anemia, normocytic normochromic 01/15/2012  . MITRAL VALVE PROLAPSE 12/24/2008   Ihor Austin, LPTA; [place]  Aldona Lento 01/02/2015, 1:11 PM  [place], [name], [address] Phone: [phone]   Fax:  [phone]  Name: JENYFER KOVAL MRN: [id number] Date of Birth: 12-Dec-1940

## 2015-01-05 ENCOUNTER — Ambulatory Visit (HOSPITAL_COMMUNITY): Payer: Medicare Other | Admitting: Physical Therapy

## 2015-01-05 DIAGNOSIS — M25551 Pain in right hip: Secondary | ICD-10-CM

## 2015-01-05 DIAGNOSIS — R2681 Unsteadiness on feet: Secondary | ICD-10-CM | POA: Diagnosis not present

## 2015-01-05 DIAGNOSIS — R262 Difficulty in walking, not elsewhere classified: Secondary | ICD-10-CM | POA: Diagnosis not present

## 2015-01-05 DIAGNOSIS — M6281 Muscle weakness (generalized): Secondary | ICD-10-CM

## 2015-01-05 DIAGNOSIS — Z96641 Presence of right artificial hip joint: Secondary | ICD-10-CM

## 2015-01-05 DIAGNOSIS — Z9660 Presence of unspecified orthopedic joint implant: Secondary | ICD-10-CM | POA: Diagnosis not present

## 2015-01-05 NOTE — Therapy (Signed)
[place], [name], [address] Phone: [phone]   Fax:  [phone]  Physical Therapy Treatment  Patient Details  Name: Mackenzie [name] MRN: [id number] Date of Birth: 11-Oct-1940 Referring Provider: Elsie Saas MD   Encounter Date: 01/05/2015      PT End of Session - 01/05/15 1207    Visit Number 3   Number of Visits 18   Date for PT Re-Evaluation 01/29/15   Authorization Type Medicare, [place], [employer]    Authorization Time Period 01/01/15 to 03/03/15   Authorization - Visit Number 3   Authorization - Number of Visits 10   PT Start Time 1100   PT Stop Time 1145   PT Time Calculation (min) 45 min   Equipment Utilized During Treatment Gait belt   Activity Tolerance Patient tolerated treatment well   Behavior During Therapy [facility] for tasks assessed/performed      Past Medical History  Diagnosis Date  . Hyperlipidemia     takes Pravastatin daily  . CHF (congestive heart failure) ([place])   . Anxiety     takes Xanax daily as needed  . Hypertension     takes Metoprolol and Azor daily  . Depression     takes Citalopram daily  . Rheumatic fever     hx of  . Wheezing on expiration   . Pneumonia     hx of-2015  . History of bronchitis     2014  . History of migraine   . Dizziness     cardiologist is aware and told pt it was related to meds  . Arthritis   . Joint pain   . History of colon polyps     benign  . Anemia, normocytic normochromic 01/2012    hx of  . Cataract     right eye    Past Surgical History  Procedure Laterality Date  . Cholecystectomy    . Abdominal hysterectomy    . Colonoscopy  2005    Negative screening study  . Colonoscopy N/A 04/04/2014    Procedure: COLONOSCOPY;  Surgeon: Rogene Houston, MD;  [facility];  Service: Endoscopy;  Laterality: N/A;  1225  . Exploratory laparotomy      x 3  . Cardiac catheterization      early 2000's  . Appendectomy    . Cataract  surgery Left   . Total hip arthroplasty Right 12/16/2014    Procedure: TOTAL HIP ARTHROPLASTY ANTERIOR APPROACH;  Surgeon: Renette Butters, MD;  Location: [place];  Service: Orthopedics;  Laterality: Right;    There were no vitals filed for this visit.  Visit Diagnosis:  Status post right hip replacement  Right hip pain  Muscle weakness  Difficulty walking  Unsteadiness      Subjective Assessment - 01/05/15 1212    Subjective Pt states she is a little sore today and has been compliant with HEP.   Currently in Pain? Yes   Pain Score 4    Pain Location Hip   Pain Orientation Right                         [facility] Adult PT Treatment/Exercise - 01/05/15 0001    Knee/Hip Exercises: Stretches   Active Hamstring Stretch Both;2 reps;30 seconds   Active Hamstring Stretch Limitations 12in   Gastroc Stretch 3 reps;30 seconds   Gastroc Stretch Limitations slant board   Knee/Hip Exercises: Standing  Hip Flexion 10 reps   Hip Flexion Limitations toe tap onto 12" step alternating   Forward Lunges Both;10 reps   Forward Lunges Limitations onto 4" step   Side Lunges Both;10 reps   Side Lunges Limitations onto 4" step with 1 HHA   Hip Abduction Both;10 reps   Hip Extension Both;10 reps   Extension Limitations within anterior hip precautions   Lateral Step Up Both;10 reps;Step Height: 4";Hand Hold: 1   Forward Step Up Both;10 reps;Step Height: 4";Hand Hold: 1   Functional Squat 10 reps   Functional Squat Limitations front of mat   Gait Training Gait training with SPC with cueing for sequence and equal stride length   Other Standing Knee Exercises --                  PT Short Term Goals - 01/01/15 1532    PT SHORT TERM GOAL #1   Title Patient will experience no more than 1/10 pain in her R hip when performing functional standing and ambulation based tasks for at least 60 minutes in order to increase independence and overall mobilty    Time 3   Period Weeks    Status New   PT SHORT TERM GOAL #2   Title Patient to be able to ambualate safely with single point cane with good sequencing, minimal unsteadiness, and good posture in order to enhance independence and mobility    Time 3   Period Weeks   Status New   PT SHORT TERM GOAL #3   Title Patient to be able to peform five times sit to stand in less than 12 seconds in order to demonstrate improved muscle power and reduced overall fall risk    Time 3   Period Weeks   Status New   PT SHORT TERM GOAL #4   Title Patient to be independent in correctly and consistently performing appropriate HEP, to be updated PRN    Time 3   Period Weeks   Status New           PT Long Term Goals - 01/01/15 1536    PT LONG TERM GOAL #1   Title Patient to demonstrate at least 4+/5 strength in all tested muscle groups in order to reduce pain, improve mobility, and enhance overall functional stability to reduce fall risk    Time 6   Period Weeks   Status New   PT LONG TERM GOAL #2   Title Patient will be able to ambulate at least 1311ft on 6MWT with no assistive device, minimal unsteadiness, and no fatigue in order to demonstrate ability to safely navigate through community without assistive device and with overall reduced fall risk    Time 6   Period Weeks   Status New   PT LONG TERM GOAL #3   Title Patient will be able to perform TUG in 9 seconds or less with no assistive device in order to demonstrate improved overall mobiltiy and greatly reduced fall risk    Time 6   Period Weeks   Status New   PT LONG TERM GOAL #4   Title Patient to report that she has been able to perform at least 30 minutes of moderate level physical activity at least 3 times per week in order to maintain functional gains and facilitate improved overall health habits    Time 6   Period Weeks   Status New  Plan - 01/05/15 1208    Clinical Impression Statement Continued focus on improving strength in Bilateral  LE and independence with gait using LRAD.  PRogressed to lunges and step actvities today.  NOted wekaness in Rt hip flexor adding toe tap activity.  PT with weakness and some discomfort reported in Rt hip flexor.  Pt able to complete 225 feet with SPC with godd sequencing following verbal cues.  No increased pain at end of session with instructions to continue HEP.   PT Next Visit Plan Continue with gait training with SPC, strength training and manual technqiues PRN for pain.          Problem List Patient Active Problem List   Diagnosis Date Noted  . Primary osteoarthritis of right hip 12/16/2014  . Postoperative anemia due to acute blood loss 12/16/2014  . Bilateral carotid artery disease ([place]) 11/05/2014  . Multiple fractures of ribs of right side 06/18/2012  . Pneumothorax on right 06/18/2012  . Unstable angina ([place]) 01/31/2012  . Cerebrovascular disease 01/31/2012  . Arteriosclerotic cardiovascular disease (ASCVD)   . Hyperlipidemia   . Hypertension   . Anemia, normocytic normochromic 01/15/2012  . MITRAL VALVE PROLAPSE 12/24/2008    Teena Irani, PTA/CLT [id number] 01/05/2015, 12:17 PM  [place] [address] [place], [name], [address] Phone: [phone]   Fax:  [phone]  Name: Mackenzie [name] MRN: [id number] Date of Birth: [birth year]

## 2015-01-06 ENCOUNTER — Ambulatory Visit (HOSPITAL_COMMUNITY): Payer: Medicare Other | Admitting: Physical Therapy

## 2015-01-06 DIAGNOSIS — R2681 Unsteadiness on feet: Secondary | ICD-10-CM

## 2015-01-06 DIAGNOSIS — R262 Difficulty in walking, not elsewhere classified: Secondary | ICD-10-CM | POA: Diagnosis not present

## 2015-01-06 DIAGNOSIS — Z9660 Presence of unspecified orthopedic joint implant: Secondary | ICD-10-CM | POA: Diagnosis not present

## 2015-01-06 DIAGNOSIS — M25551 Pain in right hip: Secondary | ICD-10-CM

## 2015-01-06 DIAGNOSIS — M6281 Muscle weakness (generalized): Secondary | ICD-10-CM | POA: Diagnosis not present

## 2015-01-06 DIAGNOSIS — Z96641 Presence of right artificial hip joint: Secondary | ICD-10-CM

## 2015-01-06 NOTE — Therapy (Signed)
[name], [name], [address] Phone: [phone]   Fax:  [phone]  Physical Therapy Treatment  Patient Details  Name: Mackenzie [name] MRN: [id number] Date of Birth: 05/06/1940 Referring Provider: Elsie Saas MD   Encounter Date: 01/06/2015      PT End of Session - 01/06/15 1158    Visit Number 4   Number of Visits 18   Date for PT Re-Evaluation 01/29/15   Authorization Type Medicare, [place], [employer]    Authorization Time Period 01/01/15 to 03/03/15   Authorization - Visit Number 4   Authorization - Number of Visits 10   PT Start Time 1110   PT Stop Time 1152   PT Time Calculation (min) 42 min   Equipment Utilized During Treatment Gait belt   Activity Tolerance Patient tolerated treatment well      Past Medical History  Diagnosis Date  . Hyperlipidemia     takes Pravastatin daily  . CHF (congestive heart failure) ([place])   . Anxiety     takes Xanax daily as needed  . Hypertension     takes Metoprolol and Azor daily  . Depression     takes Citalopram daily  . Rheumatic fever     hx of  . Wheezing on expiration   . Pneumonia     hx of-2015  . History of bronchitis     2014  . History of migraine   . Dizziness     cardiologist is aware and told pt it was related to meds  . Arthritis   . Joint pain   . History of colon polyps     benign  . Anemia, normocytic normochromic 01/2012    hx of  . Cataract     right eye    Past Surgical History  Procedure Laterality Date  . Cholecystectomy    . Abdominal hysterectomy    . Colonoscopy  2005    Negative screening study  . Colonoscopy N/A 04/04/2014    Procedure: COLONOSCOPY;  Surgeon: Rogene Houston, MD;  [facility];  Service: Endoscopy;  Laterality: N/A;  1225  . Exploratory laparotomy      x 3  . Cardiac catheterization      early 2000's  . Appendectomy    . Cataract surgery Left   . 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  Visit Diagnosis:  Status post right hip replacement  Right hip pain  Muscle weakness  Difficulty walking  Unsteadiness      Subjective Assessment - 01/06/15 1122    Subjective Pt is doing well    Currently in Pain? Yes   Pain Score 4    Pain Location Hip   Pain Orientation Right   Pain Descriptors / Indicators Burning   Pain Type Surgical pain                         [facility] Adult PT Treatment/Exercise - 01/06/15 0001    Ambulation/Gait   Ambulation/Gait Yes   Ambulation/Gait Assistance 5: Supervision   Ambulation Distance (Feet) 226 Feet   Assistive device Straight cane   Exercises   Exercises Knee/Hip   Knee/Hip Exercises: Standing   Heel Raises Both;15 reps   Forward Lunges  Both;15 reps   Side Lunges Both;15 reps   Lateral Step Up Both;10 reps;Step Height: 4"   Forward Step Up Both;10 reps;Step Height: 4"   Functional Squat 15 reps   SLS x5 B    Gait Training --   Other Standing Knee Exercises marching x 10 reps    Knee/Hip Exercises: Seated   Sit to Sand 5 reps                PT Education - 01/06/15 1158    Education provided Yes   Education Details gt training with cane   Person(s) Educated Patient   Methods Explanation;Demonstration;Verbal cues   Comprehension Returned demonstration;Verbalized understanding          PT Short Term Goals - 01/01/15 1532    PT SHORT TERM GOAL #1   Title Patient will experience no more than 1/10 pain in her R hip when performing functional standing and ambulation based tasks for at least 60 minutes in order to increase independence and overall mobilty    Time 3   Period Weeks   Status New   PT SHORT TERM GOAL #2   Title Patient to be able to ambualate safely with single point cane with good sequencing,  minimal unsteadiness, and good posture in order to enhance independence and mobility    Time 3   Period Weeks   Status New   PT SHORT TERM GOAL #3   Title Patient to be able to peform five times sit to stand in less than 12 seconds in order to demonstrate improved muscle power and reduced overall fall risk    Time 3   Period Weeks   Status New   PT SHORT TERM GOAL #4   Title Patient to be independent in correctly and consistently performing appropriate HEP, to be updated PRN    Time 3   Period Weeks   Status New           PT Long Term Goals - 01/01/15 1536    PT LONG TERM GOAL #1   Title Patient to demonstrate at least 4+/5 strength in all tested muscle groups in order to reduce pain, improve mobility, and enhance overall functional stability to reduce fall risk    Time 6   Period Weeks   Status New   PT LONG TERM GOAL #2   Title Patient will be able to ambulate at least 1337ft on 6MWT with no assistive device, minimal unsteadiness, and no fatigue in order to demonstrate ability to safely navigate through community without assistive device and with overall reduced fall risk    Time 6   Period Weeks   Status New   PT LONG TERM GOAL #3   Title Patient will be able to perform TUG in 9 seconds or less with no assistive device in order to demonstrate improved overall mobiltiy and greatly reduced fall risk    Time 6   Period Weeks   Status New   PT LONG TERM GOAL #4   Title Patient to report that she has been able to perform at least 30 minutes of moderate level physical activity at least 3 times per week in order to maintain functional gains and facilitate improved overall health habits    Time 6   Period Weeks   Status New               Plan - 01/06/15 1159    Clinical Impression Statement Added SLS, marching and sit to  stand activities to focus on balance and stability.  PT needs continued verbal cuing for proper sequencing with gait training with cane.  All exercises  completed with verbal cuing for proper technique.    PT Next Visit Plan begin stair climbing activities on 4" steps         Problem List Patient Active Problem List   Diagnosis Date Noted  . Primary osteoarthritis of right hip 12/16/2014  . Postoperative anemia due to acute blood loss 12/16/2014  . Bilateral carotid artery disease ([place]) 11/05/2014  . Multiple fractures of ribs of right side 06/18/2012  . Pneumothorax on right 06/18/2012  . Unstable angina ([place]) 01/31/2012  . Cerebrovascular disease 01/31/2012  . Arteriosclerotic cardiovascular disease (ASCVD)   . Hyperlipidemia   . Hypertension   . Anemia, normocytic normochromic 01/15/2012  . MITRAL VALVE PROLAPSE 12/24/2008    Rayetta Humphrey, PT CLT [phone]  01/06/2015, 12:03 PM  [place] [address] [place], [name], [address] Phone: [phone]   Fax:  [phone]  Name: KATHE CAMPA MRN: [id number] Date of Birth: 1940/10/16

## 2015-01-13 ENCOUNTER — Ambulatory Visit (HOSPITAL_COMMUNITY): Payer: Medicare Other

## 2015-01-13 DIAGNOSIS — M6281 Muscle weakness (generalized): Secondary | ICD-10-CM | POA: Diagnosis not present

## 2015-01-13 DIAGNOSIS — M25551 Pain in right hip: Secondary | ICD-10-CM | POA: Diagnosis not present

## 2015-01-13 DIAGNOSIS — R2681 Unsteadiness on feet: Secondary | ICD-10-CM | POA: Diagnosis not present

## 2015-01-13 DIAGNOSIS — R262 Difficulty in walking, not elsewhere classified: Secondary | ICD-10-CM

## 2015-01-13 DIAGNOSIS — Z9660 Presence of unspecified orthopedic joint implant: Secondary | ICD-10-CM | POA: Diagnosis not present

## 2015-01-13 DIAGNOSIS — Z96641 Presence of right artificial hip joint: Secondary | ICD-10-CM

## 2015-01-13 NOTE — Therapy (Signed)
[place], [name], [address] Phone: [phone]   Fax:  [phone]  Physical Therapy Treatment  Patient Details  Name: Mackenzie [name] MRN: [id number] Date of Birth: 1940-06-09 Referring Provider: Elsie Saas MD   Encounter Date: 01/13/2015      PT End of Session - 01/13/15 1307    Visit Number 5   Number of Visits 18   Date for PT Re-Evaluation 01/29/15   Authorization Type Medicare, [place], Tricare    Authorization Time Period 01/01/15 to 03/03/15   Authorization - Visit Number 5   Authorization - Number of Visits 10   PT Start Time 1300   PT Stop Time 1348   PT Time Calculation (min) 48 min   Equipment Utilized During Treatment Gait belt   Activity Tolerance Patient tolerated treatment well   Behavior During Therapy [facility] for tasks assessed/performed      Past Medical History  Diagnosis Date  . Hyperlipidemia     takes Pravastatin daily  . CHF (congestive heart failure) ([place])   . Anxiety     takes Xanax daily as needed  . Hypertension     takes Metoprolol and Azor daily  . Depression     takes Citalopram daily  . Rheumatic fever     hx of  . Wheezing on expiration   . Pneumonia     hx of-2015  . History of bronchitis     2014  . History of migraine   . Dizziness     cardiologist is aware and told pt it was related to meds  . Arthritis   . Joint pain   . History of colon polyps     benign  . Anemia, normocytic normochromic 01/2012    hx of  . Cataract     right eye    Past Surgical History  Procedure Laterality Date  . Cholecystectomy    . Abdominal hysterectomy    . Colonoscopy  2005    Negative screening study  . Colonoscopy N/A 04/04/2014    Procedure: COLONOSCOPY;  Surgeon: Rogene Houston, MD;  [facility];  Service: Endoscopy;  Laterality: N/A;  1225  . Exploratory laparotomy      x 3  . Cardiac catheterization      early 2000's  . Appendectomy    . Cataract  surgery Left   . 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  Visit Diagnosis:  Status post right hip replacement  Right hip pain  Muscle weakness  Difficulty walking  Unsteadiness      Subjective Assessment - 01/13/15 1257    Subjective Pt stated she is feeling good today, pain medication taken prior session today.  Has been compliant with HEP daily.   Pertinent History Patient had her R hip replaced by anterior approach on November 1st; patient received HHPT after surgery and has been discharged from their services. Arrived using rolling walker today.    Currently in Pain? No/denies            [facility] Adult PT Treatment/Exercise - 01/13/15 0001    Ambulation/Gait   Ambulation/Gait Yes   Ambulation/Gait Assistance 5: Supervision   Ambulation Distance (Feet) 552 Feet   Assistive device Straight cane   Knee/Hip Exercises:  Standing   Heel Raises Both;15 reps   Heel Raises Limitations toe raises 15x   Forward Lunges Both;15 reps   Forward Lunges Limitations onto 4" step   Side Lunges Both;15 reps   Side Lunges Limitations onto 4" step with 1 HHA   Lateral Step Up Right;15 reps;Hand Hold: 1;Step Height: 4"   Forward Step Up Both;15 reps;Hand Hold: 0;Step Height: 4"   Step Down Right;10 reps;Hand Hold: 1;Step Height: 4"   Functional Squat 15 reps   Stairs 3RT reciprocal pattern 1 HR 4 in    SLS Lt 13", Rt 7" max of 3   Other Standing Knee Exercises Toe tapping 6in step no HHA, Marching 10x 5" no HHA alternating           PT Short Term Goals - 01/01/15 1532    PT SHORT TERM GOAL #1   Title Patient will experience no more than 1/10 pain in her R hip when performing functional standing and ambulation based tasks for at least 60 minutes in order to increase independence and overall  mobilty    Time 3   Period Weeks   Status New   PT SHORT TERM GOAL #2   Title Patient to be able to ambualate safely with single point cane with good sequencing, minimal unsteadiness, and good posture in order to enhance independence and mobility    Time 3   Period Weeks   Status New   PT SHORT TERM GOAL #3   Title Patient to be able to peform five times sit to stand in less than 12 seconds in order to demonstrate improved muscle power and reduced overall fall risk    Time 3   Period Weeks   Status New   PT SHORT TERM GOAL #4   Title Patient to be independent in correctly and consistently performing appropriate HEP, to be updated PRN    Time 3   Period Weeks   Status New           PT Long Term Goals - 01/01/15 1536    PT LONG TERM GOAL #1   Title Patient to demonstrate at least 4+/5 strength in all tested muscle groups in order to reduce pain, improve mobility, and enhance overall functional stability to reduce fall risk    Time 6   Period Weeks   Status New   PT LONG TERM GOAL #2   Title Patient will be able to ambulate at least 1368ft on 6MWT with no assistive device, minimal unsteadiness, and no fatigue in order to demonstrate ability to safely navigate through community without assistive device and with overall reduced fall risk    Time 6   Period Weeks   Status New   PT LONG TERM GOAL #3   Title Patient will be able to perform TUG in 9 seconds or less with no assistive device in order to demonstrate improved overall mobiltiy and greatly reduced fall risk    Time 6   Period Weeks   Status New   PT LONG TERM GOAL #4   Title Patient to report that she has been able to perform at least 30 minutes of moderate level physical activity at least 3 times per week in order to maintain functional gains and facilitate improved overall health habits    Time 6   Period Weeks   Status New               Plan - 01/13/15 1616    Clinical  Impression Statement Pt improving  sequence with SPC, required minimal verbal cueing for proper sequence initially then able to ambulate very stable with SPC.  Continued session focus on functional strengtthening, pt progressing well.  Began step down training and reciprocal pattern with 1 HR on 4 in step with verbal cueing required for control to improve eccentric strengthening. No reports of pain through session.     PT Next Visit Plan Continue with current PT POC for functional strengthening, balance stabilty exercises and gait training with LRAD.          Problem List Patient Active Problem List   Diagnosis Date Noted  . Primary osteoarthritis of right hip 12/16/2014  . Postoperative anemia due to acute blood loss 12/16/2014  . Bilateral carotid artery disease ([place]) 11/05/2014  . Multiple fractures of ribs of right side 06/18/2012  . Pneumothorax on right 06/18/2012  . Unstable angina ([place]) 01/31/2012  . Cerebrovascular disease 01/31/2012  . Arteriosclerotic cardiovascular disease (ASCVD)   . Hyperlipidemia   . Hypertension   . Anemia, normocytic normochromic 01/15/2012  . MITRAL VALVE PROLAPSE 12/24/2008   Ihor Austin, LPTA; [place]  Aldona Lento 01/13/2015, 4:20 PM  [place], [name], [address] Phone: [phone]   Fax:  [phone]  Name: Mackenzie [name] MRN: [id number] Date of Birth: September 07, 1940

## 2015-01-14 ENCOUNTER — Ambulatory Visit (HOSPITAL_COMMUNITY): Payer: Medicare Other

## 2015-01-14 DIAGNOSIS — M25551 Pain in right hip: Secondary | ICD-10-CM

## 2015-01-14 DIAGNOSIS — M6281 Muscle weakness (generalized): Secondary | ICD-10-CM

## 2015-01-14 DIAGNOSIS — R2681 Unsteadiness on feet: Secondary | ICD-10-CM | POA: Diagnosis not present

## 2015-01-14 DIAGNOSIS — R262 Difficulty in walking, not elsewhere classified: Secondary | ICD-10-CM | POA: Diagnosis not present

## 2015-01-14 DIAGNOSIS — Z96641 Presence of right artificial hip joint: Secondary | ICD-10-CM

## 2015-01-14 DIAGNOSIS — Z9660 Presence of unspecified orthopedic joint implant: Secondary | ICD-10-CM | POA: Diagnosis not present

## 2015-01-14 NOTE — Therapy (Signed)
[place], [name], [address] Phone: [phone]   Fax:  [phone]  Physical Therapy Treatment  Patient Details  Name: Mackenzie [name] MRN: [id number] Date of Birth: 1940-09-11 Referring Provider: Elsie Saas, MD  Encounter Date: 01/14/2015      PT End of Session - 01/14/15 1318    Visit Number 6   Number of Visits 18   Date for PT Re-Evaluation 01/29/15   Authorization Type Medicare, [place], Tricare    Authorization Time Period 01/01/15 to 03/03/15   Authorization - Visit Number 6   Authorization - Number of Visits 10   PT Start Time 1300   PT Stop Time 1346   PT Time Calculation (min) 46 min   Equipment Utilized During Treatment Gait belt   Activity Tolerance Patient tolerated treatment well   Behavior During Therapy [facility] for tasks assessed/performed      Past Medical History  Diagnosis Date  . Hyperlipidemia     takes Pravastatin daily  . CHF (congestive heart failure) ([place])   . Anxiety     takes Xanax daily as needed  . Hypertension     takes Metoprolol and Azor daily  . Depression     takes Citalopram daily  . Rheumatic fever     hx of  . Wheezing on expiration   . Pneumonia     hx of-2015  . History of bronchitis     2014  . History of migraine   . Dizziness     cardiologist is aware and told pt it was related to meds  . Arthritis   . Joint pain   . History of colon polyps     benign  . Anemia, normocytic normochromic 01/2012    hx of  . Cataract     right eye    Past Surgical History  Procedure Laterality Date  . Cholecystectomy    . Abdominal hysterectomy    . Colonoscopy  2005    Negative screening study  . Colonoscopy N/A 04/04/2014    Procedure: COLONOSCOPY;  Surgeon: Rogene Houston, MD;  [facility];  Service: Endoscopy;  Laterality: N/A;  1225  . Exploratory laparotomy      x 3  . Cardiac catheterization      early 2000's  . Appendectomy    . Cataract  surgery Left   . 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  Visit Diagnosis:  Status post right hip replacement  Right hip pain  Muscle weakness  Difficulty walking  Unsteadiness      Subjective Assessment - 01/14/15 1252    Subjective Pt stated she was sore following last session, no reports of pain just soreness.  Pain meds taken prior therapy today   Currently in Pain? No/denies   Pain Descriptors / Indicators Sore            [facility] PT Assessment - 01/14/15 0001    Assessment   Medical Diagnosis R anterior hip replacement    Referring Provider Elsie Saas, MD   Onset Date/Surgical Date 12/16/14   Next MD Visit 01/28/2015   Precautions   Precautions Anterior Hip          [facility] Adult PT Treatment/Exercise - 01/14/15 0001  Ambulation/Gait   Ambulation/Gait Yes   Ambulation/Gait Assistance 5: Supervision   Ambulation Distance (Feet) 552 Feet   Assistive device Straight cane;None   Knee/Hip Exercises: Standing   Heel Raises Both;15 reps   Heel Raises Limitations toe raises 15x   Forward Lunges Both;15 reps   Forward Lunges Limitations onto 4" step   Functional Squat 10 reps   Functional Squat Limitations 3D hip excursion   Stairs 3RT reciprocal pattern 1 HR 4 in    Rocker Board 2 minutes   Rocker Board Limitations R/L and A/P   SLS Lt 8", Rt 11" max of 3   Gait Training 271ft with SPC, 226 ft no AD   Other Standing Knee Exercises Toe tapping 6in step no HHA, Marching 10x 5" no HHA alternating   Other Standing Knee Exercises 6in hurdles 2RT; sidestepping 2RT   Manual Therapy   Manual Therapy Soft tissue mobilization   Manual therapy comments supine position   Soft tissue mobilization Rt quad            PT Short Term Goals - 01/01/15 1532    PT SHORT TERM  GOAL #1   Title Patient will experience no more than 1/10 pain in her R hip when performing functional standing and ambulation based tasks for at least 60 minutes in order to increase independence and overall mobilty    Time 3   Period Weeks   Status New   PT SHORT TERM GOAL #2   Title Patient to be able to ambualate safely with single point cane with good sequencing, minimal unsteadiness, and good posture in order to enhance independence and mobility    Time 3   Period Weeks   Status New   PT SHORT TERM GOAL #3   Title Patient to be able to peform five times sit to stand in less than 12 seconds in order to demonstrate improved muscle power and reduced overall fall risk    Time 3   Period Weeks   Status New   PT SHORT TERM GOAL #4   Title Patient to be independent in correctly and consistently performing appropriate HEP, to be updated PRN    Time 3   Period Weeks   Status New           PT Long Term Goals - 01/01/15 1536    PT LONG TERM GOAL #1   Title Patient to demonstrate at least 4+/5 strength in all tested muscle groups in order to reduce pain, improve mobility, and enhance overall functional stability to reduce fall risk    Time 6   Period Weeks   Status New   PT LONG TERM GOAL #2   Title Patient will be able to ambulate at least 1338ft on 6MWT with no assistive device, minimal unsteadiness, and no fatigue in order to demonstrate ability to safely navigate through community without assistive device and with overall reduced fall risk    Time 6   Period Weeks   Status New   PT LONG TERM GOAL #3   Title Patient will be able to perform TUG in 9 seconds or less with no assistive device in order to demonstrate improved overall mobiltiy and greatly reduced fall risk    Time 6   Period Weeks   Status New   PT LONG TERM GOAL #4   Title Patient to report that she has been able to perform at least 30 minutes of moderate level physical activity at least 3 times  per week in order  to maintain functional gains and facilitate improved overall health habits    Time 6   Period Weeks   Status New               Plan - 01/14/15 1747    Clinical Impression Statement Progressed to gait training with no AD, no LOB and able to demonstrate good gait mechanics.  Pt encouraged to buy [facility] and begin ambulating with indoors, continue with RW for longer distances.  Session focus on improving functional strengthening and balance training.  Added toe tapping with intermittent HHA for hip flexion strengthening and balance with SLS as well as 6 in hurdles.  Began sidestepping for glut med strengthening with cueing to pick up Rt foot.  Pt c/o thigh pain with hip flexion movements, added STM at end of session for pain control with noted tightness rectus femoris,  End of session pt reports pain resolved.   PT Next Visit Plan Continue with current PT POC for functional strengthening, balance stabilty exercises and gait training with LRAD.          Problem List Patient Active Problem List   Diagnosis Date Noted  . Primary osteoarthritis of right hip 12/16/2014  . Postoperative anemia due to acute blood loss 12/16/2014  . Bilateral carotid artery disease ([place]) 11/05/2014  . Multiple fractures of ribs of right side 06/18/2012  . Pneumothorax on right 06/18/2012  . Unstable angina ([place]) 01/31/2012  . Cerebrovascular disease 01/31/2012  . Arteriosclerotic cardiovascular disease (ASCVD)   . Hyperlipidemia   . Hypertension   . Anemia, normocytic normochromic 01/15/2012  . MITRAL VALVE PROLAPSE 12/24/2008   Ihor Austin, LPTA; [place]  Aldona Lento 01/14/2015, 6:32 PM  [place], [name], [address] Phone: [phone]   Fax:  [phone]  Name: Mackenzie [name] MRN: [id number] Date of Birth: [birth year]

## 2015-01-16 ENCOUNTER — Ambulatory Visit (HOSPITAL_COMMUNITY): Payer: Medicare Other

## 2015-01-20 ENCOUNTER — Encounter (HOSPITAL_COMMUNITY): Payer: Medicare Other

## 2015-01-21 ENCOUNTER — Ambulatory Visit (HOSPITAL_COMMUNITY): Payer: Medicare Other | Attending: Orthopedic Surgery

## 2015-01-21 DIAGNOSIS — Z9660 Presence of unspecified orthopedic joint implant: Secondary | ICD-10-CM | POA: Insufficient documentation

## 2015-01-21 DIAGNOSIS — M6281 Muscle weakness (generalized): Secondary | ICD-10-CM | POA: Diagnosis not present

## 2015-01-21 DIAGNOSIS — Z96641 Presence of right artificial hip joint: Secondary | ICD-10-CM

## 2015-01-21 DIAGNOSIS — R262 Difficulty in walking, not elsewhere classified: Secondary | ICD-10-CM | POA: Diagnosis not present

## 2015-01-21 DIAGNOSIS — R2681 Unsteadiness on feet: Secondary | ICD-10-CM | POA: Diagnosis not present

## 2015-01-21 DIAGNOSIS — M25551 Pain in right hip: Secondary | ICD-10-CM | POA: Diagnosis not present

## 2015-01-21 DIAGNOSIS — L309 Dermatitis, unspecified: Secondary | ICD-10-CM | POA: Diagnosis not present

## 2015-01-21 NOTE — Therapy (Signed)
[place], [name], [address] Phone: [phone]   Fax:  [phone]  Physical Therapy Treatment  Patient Details  Name: Mackenzie [name] MRN: [id number] Date of Birth: March 07, 1940 Referring Provider: Elsie Saas, MD  Encounter Date: 01/21/2015      PT End of Session - 01/21/15 1311    Visit Number 7   Number of Visits 18   Date for PT Re-Evaluation 01/29/15   Authorization Type Medicare, [place], Tricare    Authorization Time Period 01/01/15 to 03/03/15   Authorization - Visit Number 7   Authorization - Number of Visits 10   PT Start Time 1300   PT Stop Time 1348   PT Time Calculation (min) 48 min   Equipment Utilized During Treatment Gait belt   Activity Tolerance Patient tolerated treatment well   Behavior During Therapy [facility] for tasks assessed/performed      Past Medical History  Diagnosis Date  . Hyperlipidemia     takes Pravastatin daily  . CHF (congestive heart failure) ([place])   . Anxiety     takes Xanax daily as needed  . Hypertension     takes Metoprolol and Azor daily  . Depression     takes Citalopram daily  . Rheumatic fever     hx of  . Wheezing on expiration   . Pneumonia     hx of-2015  . History of bronchitis     2014  . History of migraine   . Dizziness     cardiologist is aware and told pt it was related to meds  . Arthritis   . Joint pain   . History of colon polyps     benign  . Anemia, normocytic normochromic 01/2012    hx of  . Cataract     right eye    Past Surgical History  Procedure Laterality Date  . Cholecystectomy    . Abdominal hysterectomy    . Colonoscopy  2005    Negative screening study  . Colonoscopy N/A 04/04/2014    Procedure: COLONOSCOPY;  Surgeon: Rogene Houston, MD;  [facility];  Service: Endoscopy;  Laterality: N/A;  1225  . Exploratory laparotomy      x 3  . Cardiac catheterization      early 2000's  . Appendectomy    . Cataract  surgery Left   . 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  Visit Diagnosis:  Status post right hip replacement  Right hip pain  Muscle weakness  Unsteadiness  Difficulty walking      Subjective Assessment - 01/21/15 1257    Subjective Pt stated pain scale 3/10 achey, pt stated she purchased [facility] and feels comfortable ambulating with cane at home.   Currently in Pain? Yes   Pain Score 3    Pain Location Hip   Pain Orientation Right   Pain Descriptors / Indicators Aching          [facility] Adult PT Treatment/Exercise - 01/21/15 0001    Knee/Hip Exercises: Standing   Heel Raises Both;15 reps   Heel Raises Limitations toe raises 15x   Lateral Step Up Right;15 reps;Hand Hold: 1;Step Height: 4"   Forward Step Up Right;10 reps;Hand Hold: 1;Step Height: 6"   Functional Squat  15 reps   Functional Squat Limitations 3D hip excursion   Stairs 3RT reciprocal pattern 1 HR 4 in    Rocker Board 2 minutes   Rocker Board Limitations R/L and A/P   SLS Rt 11", Lt 11" max of 5   Other Standing Knee Exercises 6 and 12in hurdles 2RT; sidestepping 2RT with RTB   Manual Therapy   Manual Therapy Soft tissue mobilization   Manual therapy comments supine position   Soft tissue mobilization Rt quad           PT Short Term Goals - 01/01/15 1532    PT SHORT TERM GOAL #1   Title Patient will experience no more than 1/10 pain in her R hip when performing functional standing and ambulation based tasks for at least 60 minutes in order to increase independence and overall mobilty    Time 3   Period Weeks   Status New   PT SHORT TERM GOAL #2   Title Patient to be able to ambualate safely with single point cane with good sequencing, minimal unsteadiness, and good posture in order to enhance independence and  mobility    Time 3   Period Weeks   Status New   PT SHORT TERM GOAL #3   Title Patient to be able to peform five times sit to stand in less than 12 seconds in order to demonstrate improved muscle power and reduced overall fall risk    Time 3   Period Weeks   Status New   PT SHORT TERM GOAL #4   Title Patient to be independent in correctly and consistently performing appropriate HEP, to be updated PRN    Time 3   Period Weeks   Status New           PT Long Term Goals - 01/01/15 1536    PT LONG TERM GOAL #1   Title Patient to demonstrate at least 4+/5 strength in all tested muscle groups in order to reduce pain, improve mobility, and enhance overall functional stability to reduce fall risk    Time 6   Period Weeks   Status New   PT LONG TERM GOAL #2   Title Patient will be able to ambulate at least 1376ft on 6MWT with no assistive device, minimal unsteadiness, and no fatigue in order to demonstrate ability to safely navigate through community without assistive device and with overall reduced fall risk    Time 6   Period Weeks   Status New   PT LONG TERM GOAL #3   Title Patient will be able to perform TUG in 9 seconds or less with no assistive device in order to demonstrate improved overall mobiltiy and greatly reduced fall risk    Time 6   Period Weeks   Status New   PT LONG TERM GOAL #4   Title Patient to report that she has been able to perform at least 30 minutes of moderate level physical activity at least 3 times per week in order to maintain functional gains and facilitate improved overall health habits    Time 6   Period Weeks   Status New               Plan - 01/21/15 1408    Clinical Impression Statement Pt making great progress towards functional strengthening and gait mechanics with SPC.  Pt able to demonstrate appropriate sequence without cueing required today with gait.  Overall hip stability and strengthening are improving with noted increased  SLS time,  ability to complete 6 and 12 hurdles and glut med strength improving with ability to complete sidestepping wtih theraband resistance today.  Ended session with manual soft tissue techniques to reduce tightness and edema control of proximal quadriceps.  No reports of increased pain through session.     PT Next Visit Plan Continue with current PT POC for functional strengthening, balance stabilty exercises and gait training with LRAD.          Problem List Patient Active Problem List   Diagnosis Date Noted  . Primary osteoarthritis of right hip 12/16/2014  . Postoperative anemia due to acute blood loss 12/16/2014  . Bilateral carotid artery disease ([place]) 11/05/2014  . Multiple fractures of ribs of right side 06/18/2012  . Pneumothorax on right 06/18/2012  . Unstable angina ([place]) 01/31/2012  . Cerebrovascular disease 01/31/2012  . Arteriosclerotic cardiovascular disease (ASCVD)   . Hyperlipidemia   . Hypertension   . Anemia, normocytic normochromic 01/15/2012  . MITRAL VALVE PROLAPSE 12/24/2008  Ihor Austin, LPTA; [place]   Aldona Lento 01/21/2015, 6:38 PM  [place], [name], [address] Phone: [phone]   Fax:  [phone]  Name: Mackenzie [name] MRN: [id number] Date of Birth: September 04, 1940

## 2015-01-22 ENCOUNTER — Ambulatory Visit (HOSPITAL_COMMUNITY): Payer: Medicare Other

## 2015-01-22 DIAGNOSIS — R2681 Unsteadiness on feet: Secondary | ICD-10-CM | POA: Diagnosis not present

## 2015-01-22 DIAGNOSIS — R262 Difficulty in walking, not elsewhere classified: Secondary | ICD-10-CM

## 2015-01-22 DIAGNOSIS — Z96641 Presence of right artificial hip joint: Secondary | ICD-10-CM

## 2015-01-22 DIAGNOSIS — M6281 Muscle weakness (generalized): Secondary | ICD-10-CM | POA: Diagnosis not present

## 2015-01-22 DIAGNOSIS — Z9660 Presence of unspecified orthopedic joint implant: Secondary | ICD-10-CM | POA: Diagnosis not present

## 2015-01-22 DIAGNOSIS — M25551 Pain in right hip: Secondary | ICD-10-CM

## 2015-01-22 NOTE — Patient Instructions (Signed)
  Single leg balance:  Standing on one leg, near a countertop or inside a walker, balance for 15 seconds, bracing with hands only as needed. Switch legs. Perform 5x for 15 seconds on each leg.

## 2015-01-22 NOTE — Therapy (Signed)
[place], [name], [address] Phone: [phone]   Fax:  [phone]  Physical Therapy Treatment  Patient Details  Name: Mackenzie [name] MRN: [id number] Date of Birth: [birth year] Referring Provider: Elsie Saas, MD  Encounter Date: 01/22/2015      PT End of Session - 01/22/15 1048    Visit Number 8   Number of Visits 18   Date for PT Re-Evaluation 01/29/15   Authorization Type Medicare, [place], Tricare    Authorization Time Period 01/01/15 to 03/03/15   Authorization - Visit Number 8   Authorization - Number of Visits 10   PT Start Time 1017   PT Stop Time 1055   PT Time Calculation (min) 38 min   Activity Tolerance Patient tolerated treatment well;Patient limited by fatigue   Behavior During Therapy [facility] for tasks assessed/performed      Past Medical History  Diagnosis Date  . Hyperlipidemia     takes Pravastatin daily  . CHF (congestive heart failure) ([place])   . Anxiety     takes Xanax daily as needed  . Hypertension     takes Metoprolol and Azor daily  . Depression     takes Citalopram daily  . Rheumatic fever     hx of  . Wheezing on expiration   . Pneumonia     hx of-2015  . History of bronchitis     2014  . History of migraine   . Dizziness     cardiologist is aware and told pt it was related to meds  . Arthritis   . Joint pain   . History of colon polyps     benign  . Anemia, normocytic normochromic 01/2012    hx of  . Cataract     right eye    Past Surgical History  Procedure Laterality Date  . Cholecystectomy    . Abdominal hysterectomy    . Colonoscopy  2005    Negative screening study  . Colonoscopy N/A 04/04/2014    Procedure: COLONOSCOPY;  Surgeon: Rogene Houston, MD;  [facility];  Service: Endoscopy;  Laterality: N/A;  1225  . Exploratory laparotomy      x 3  . Cardiac catheterization      early 2000's  . Appendectomy    . Cataract surgery Left   . Total hip  arthroplasty Right 12/16/2014    Procedure: TOTAL HIP ARTHROPLASTY ANTERIOR APPROACH;  Surgeon: Renette Butters, MD;  Location: [place];  Service: Orthopedics;  Laterality: Right;    There were no vitals filed for this visit.  Visit Diagnosis:  Status post right hip replacement  Right hip pain  Muscle weakness  Unsteadiness  Difficulty walking      Subjective Assessment - 01/22/15 1021    Subjective Pt reporting pain is great today, but HEP is not on track isince she has not had one since DC HHPT.    Patient Stated Goals get back to PLOF    Currently in Pain? No/denies   Pain Score 0-No pain                         [facility] Adult PT Treatment/Exercise - 01/22/15 0001    Exercises   Exercises Knee/Hip   Knee/Hip Exercises: Standing   Heel Raises 2 sets;20 reps  2x20 starting in DF (2" box)   Heel Raises Limitations 2x20 toe raises  2x20 starting in PF (  2" box at heels)   Lateral Step Up Right;15 reps;Hand Hold: 1;Step Height: 4"   Forward Step Up Right;10 reps;Hand Hold: 1;Step Height: 6"   Functional Squat 15 reps   Stairs 3RT reciprocal pattern 1 HR 4 in    Rocker Board 2 minutes   Rocker Board Limitations R/L and A/P   SLS 5x15 bilar, multiple LOB, self correcting   Knee/Hip Exercises: Seated   Long Arc Quad 2 sets;10 reps  5# bilat                PT Education - 01/22/15 1047    Education provided Yes   Education Details Value of quads strengthening for knee OA pain relief.   Person(s) Educated Patient   Methods Explanation   Comprehension Verbalized understanding;Returned demonstration          PT Short Term Goals - 01/01/15 1532    PT SHORT TERM GOAL #1   Title Patient will experience no more than 1/10 pain in her R hip when performing functional standing and ambulation based tasks for at least 60 minutes in order to increase independence and overall mobilty    Time 3   Period Weeks   Status New   PT SHORT TERM GOAL #2   Title  Patient to be able to ambualate safely with single point cane with good sequencing, minimal unsteadiness, and good posture in order to enhance independence and mobility    Time 3   Period Weeks   Status New   PT SHORT TERM GOAL #3   Title Patient to be able to peform five times sit to stand in less than 12 seconds in order to demonstrate improved muscle power and reduced overall fall risk    Time 3   Period Weeks   Status New   PT SHORT TERM GOAL #4   Title Patient to be independent in correctly and consistently performing appropriate HEP, to be updated PRN    Time 3   Period Weeks   Status New           PT Long Term Goals - 01/01/15 1536    PT LONG TERM GOAL #1   Title Patient to demonstrate at least 4+/5 strength in all tested muscle groups in order to reduce pain, improve mobility, and enhance overall functional stability to reduce fall risk    Time 6   Period Weeks   Status New   PT LONG TERM GOAL #2   Title Patient will be able to ambulate at least 1346ft on 6MWT with no assistive device, minimal unsteadiness, and no fatigue in order to demonstrate ability to safely navigate through community without assistive device and with overall reduced fall risk    Time 6   Period Weeks   Status New   PT LONG TERM GOAL #3   Title Patient will be able to perform TUG in 9 seconds or less with no assistive device in order to demonstrate improved overall mobiltiy and greatly reduced fall risk    Time 6   Period Weeks   Status New   PT LONG TERM GOAL #4   Title Patient to report that she has been able to perform at least 30 minutes of moderate level physical activity at least 3 times per week in order to maintain functional gains and facilitate improved overall health habits    Time 6   Period Weeks   Status New  Plan - 01/22/15 1050    Clinical Impression Statement Making excellent progress in strength, still mostly limited by blance deficits, worse with fatigue  in the hips. Pt also limited by anterior trunk weakness, mainting signfiifcant anterior pelvic tilt throughout. Able to progress strengthening this session without complaint.    Pt will benefit from skilled therapeutic intervention in order to improve on the following deficits Abnormal gait;Decreased activity tolerance;Decreased strength;Pain;Decreased balance;Decreased mobility;Difficulty walking;Improper body mechanics;Decreased coordination;Impaired flexibility;Postural dysfunction   Rehab Potential Excellent   PT Frequency 3x / week   PT Duration 6 weeks   PT Treatment/Interventions ADLs/Self Care Home Management;Cryotherapy;DME Instruction;Gait training;Stair training;Functional mobility training;Therapeutic activities;Therapeutic exercise;Balance training;Neuromuscular re-education;Patient/family education;Manual techniques   PT Next Visit Plan continue to progress anteriro hip strength , quads, and balance.    PT Home Exercise Plan Added Single Leg Stance   Consulted and Agree with Plan of Care Patient        Problem List Patient Active Problem List   Diagnosis Date Noted  . Primary osteoarthritis of right hip 12/16/2014  . Postoperative anemia due to acute blood loss 12/16/2014  . Bilateral carotid artery disease ([place]) 11/05/2014  . Multiple fractures of ribs of right side 06/18/2012  . Pneumothorax on right 06/18/2012  . Unstable angina ([place]) 01/31/2012  . Cerebrovascular disease 01/31/2012  . Arteriosclerotic cardiovascular disease (ASCVD)   . Hyperlipidemia   . Hypertension   . Anemia, normocytic normochromic 01/15/2012  . MITRAL VALVE PROLAPSE 12/24/2008    Hang Ammon C 01/22/2015, 10:59 AM 10:59 AM  Etta Grandchild, PT, DPT  [place] License # [id number]      [facility] [place], [name], [address] Phone: [phone]   Fax:  [phone]  Name: Mackenzie [name] MRN: [id number] Date of Birth: [birth year]

## 2015-01-23 ENCOUNTER — Ambulatory Visit (HOSPITAL_COMMUNITY): Payer: Medicare Other

## 2015-01-23 DIAGNOSIS — R262 Difficulty in walking, not elsewhere classified: Secondary | ICD-10-CM

## 2015-01-23 DIAGNOSIS — M6281 Muscle weakness (generalized): Secondary | ICD-10-CM

## 2015-01-23 DIAGNOSIS — Z96641 Presence of right artificial hip joint: Secondary | ICD-10-CM

## 2015-01-23 DIAGNOSIS — Z9660 Presence of unspecified orthopedic joint implant: Secondary | ICD-10-CM | POA: Diagnosis not present

## 2015-01-23 DIAGNOSIS — M25551 Pain in right hip: Secondary | ICD-10-CM | POA: Diagnosis not present

## 2015-01-23 DIAGNOSIS — R2681 Unsteadiness on feet: Secondary | ICD-10-CM

## 2015-01-23 NOTE — Therapy (Signed)
[place], [name], [address] Phone: [phone]   Fax:  [phone]  Physical Therapy Treatment  Patient Details  Name: DIAMONDNIQUE TIPPEN MRN: [id number] Date of Birth: December 03, 1940 Referring Provider: Elsie Saas, MD  Encounter Date: 01/23/2015      PT End of Session - 01/23/15 1308    Visit Number 9   Number of Visits 18   Date for PT Re-Evaluation 01/29/15   Authorization Type Medicare, [place], Tricare    Authorization Time Period 01/01/15 to 03/03/15   Authorization - Visit Number 9   Authorization - Number of Visits 10   PT Start Time [id number]   PT Stop Time 1345   PT Time Calculation (min) 43 min   Activity Tolerance Patient tolerated treatment well   Behavior During Therapy [facility] for tasks assessed/performed      Past Medical History  Diagnosis Date  . Hyperlipidemia     takes Pravastatin daily  . CHF (congestive heart failure) ([place])   . Anxiety     takes Xanax daily as needed  . Hypertension     takes Metoprolol and Azor daily  . Depression     takes Citalopram daily  . Rheumatic fever     hx of  . Wheezing on expiration   . Pneumonia     hx of-2015  . History of bronchitis     2014  . History of migraine   . Dizziness     cardiologist is aware and told pt it was related to meds  . Arthritis   . Joint pain   . History of colon polyps     benign  . Anemia, normocytic normochromic 01/2012    hx of  . Cataract     right eye    Past Surgical History  Procedure Laterality Date  . Cholecystectomy    . Abdominal hysterectomy    . Colonoscopy  2005    Negative screening study  . Colonoscopy N/A 04/04/2014    Procedure: COLONOSCOPY;  Surgeon: Rogene Houston, MD;  [facility];  Service: Endoscopy;  Laterality: N/A;  1225  . Exploratory laparotomy      x 3  . Cardiac catheterization      early 2000's  . Appendectomy    . Cataract surgery Left   . Total hip arthroplasty Right  12/16/2014    Procedure: TOTAL HIP ARTHROPLASTY ANTERIOR APPROACH;  Surgeon: Renette Butters, MD;  Location: [place];  Service: Orthopedics;  Laterality: Right;    There were no vitals filed for this visit.  Visit Diagnosis:  Right hip pain  Muscle weakness  Unsteadiness  Difficulty walking  Status post right hip replacement      Subjective Assessment - 01/23/15 1307    Subjective Pt stated increased swelling following last session, pain scale 4-5/10   Currently in Pain? Yes   Pain Score 5    Pain Location Hip   Pain Orientation Right   Pain Descriptors / Indicators Aching;Sore           [facility] Adult PT Treatment/Exercise - 01/23/15 0001    Knee/Hip Exercises: Standing   Heel Raises 20 reps   Heel Raises Limitations toe raises on 2in step; incline slope with heel raises   Lateral Step Up Right;15 reps;Hand Hold: 1;Step Height: 4"   Forward Step Up Right;10 reps;Hand Hold: 1;Step Height: 6"   Step Down Right;10 reps;Hand Hold: 1;Step Height: 6"   Functional  Squat 15 reps   Stairs 3Rt ascend 7in and descend 4in step   SLS Rt 16" Lt 12" max of 3   Other Standing Knee Exercises toe tapping 12in hurdles 10x each LE   Other Standing Knee Exercises 6 and 12in hurdles 3RT; sidestepping 2RT with RTB   Manual Therapy   Manual Therapy Soft tissue mobilization   Manual therapy comments supine position   Soft tissue mobilization Rt quad                PT Education - 01/22/15 1047    Education provided Yes   Education Details Value of quads strengthening for knee OA pain relief.   Person(s) Educated Patient   Methods Explanation   Comprehension Verbalized understanding;Returned demonstration          PT Short Term Goals - 01/01/15 1532    PT SHORT TERM GOAL #1   Title Patient will experience no more than 1/10 pain in her R hip when performing functional standing and ambulation based tasks for at least 60 minutes in order to increase independence and overall  mobilty    Time 3   Period Weeks   Status New   PT SHORT TERM GOAL #2   Title Patient to be able to ambualate safely with single point cane with good sequencing, minimal unsteadiness, and good posture in order to enhance independence and mobility    Time 3   Period Weeks   Status New   PT SHORT TERM GOAL #3   Title Patient to be able to peform five times sit to stand in less than 12 seconds in order to demonstrate improved muscle power and reduced overall fall risk    Time 3   Period Weeks   Status New   PT SHORT TERM GOAL #4   Title Patient to be independent in correctly and consistently performing appropriate HEP, to be updated PRN    Time 3   Period Weeks   Status New           PT Long Term Goals - 01/01/15 1536    PT LONG TERM GOAL #1   Title Patient to demonstrate at least 4+/5 strength in all tested muscle groups in order to reduce pain, improve mobility, and enhance overall functional stability to reduce fall risk    Time 6   Period Weeks   Status New   PT LONG TERM GOAL #2   Title Patient will be able to ambulate at least 1316ft on 6MWT with no assistive device, minimal unsteadiness, and no fatigue in order to demonstrate ability to safely navigate through community without assistive device and with overall reduced fall risk    Time 6   Period Weeks   Status New   PT LONG TERM GOAL #3   Title Patient will be able to perform TUG in 9 seconds or less with no assistive device in order to demonstrate improved overall mobiltiy and greatly reduced fall risk    Time 6   Period Weeks   Status New   PT LONG TERM GOAL #4   Title Patient to report that she has been able to perform at least 30 minutes of moderate level physical activity at least 3 times per week in order to maintain functional gains and facilitate improved overall health habits    Time 6   Period Weeks   Status New               Plan -  01/23/15 1756    Clinical Impression Statement Pt making great  progress with functional strengthening though does continue to demonstrate increased difficulty with weak hip flexion and continues to exhibit balance deficits with SLS activities.  Min assistance required for balance activities to reduce risk of falls.  Added toe tapping on 12in hurdle for SLS and hip flexion strengtheing.  increased manual soft tissue mobilization time for pain control with noted increaseed edema to Rt hip and tightness to lateral quadriceps. Pt reports pain free at end of session.     PT Next Visit Plan GCode due next session.  continue to progress anteriro hip strength , quads, and balance.         Problem List Patient Active Problem List   Diagnosis Date Noted  . Primary osteoarthritis of right hip 12/16/2014  . Postoperative anemia due to acute blood loss 12/16/2014  . Bilateral carotid artery disease ([place]) 11/05/2014  . Multiple fractures of ribs of right side 06/18/2012  . Pneumothorax on right 06/18/2012  . Unstable angina ([place]) 01/31/2012  . Cerebrovascular disease 01/31/2012  . Arteriosclerotic cardiovascular disease (ASCVD)   . Hyperlipidemia   . Hypertension   . Anemia, normocytic normochromic 01/15/2012  . MITRAL VALVE PROLAPSE 12/24/2008   Ihor Austin, [place]; [place]  Aldona Lento 01/23/2015, 6:25 PM  [place], [name], [address] Phone: [phone]   Fax:  [phone]  Name: LALENA STOLTZFOOS MRN: [id number] Date of Birth: [birth year]

## 2015-01-26 ENCOUNTER — Ambulatory Visit (HOSPITAL_COMMUNITY): Payer: Medicare Other | Admitting: Physical Therapy

## 2015-01-26 DIAGNOSIS — R262 Difficulty in walking, not elsewhere classified: Secondary | ICD-10-CM | POA: Diagnosis not present

## 2015-01-26 DIAGNOSIS — M25551 Pain in right hip: Secondary | ICD-10-CM | POA: Diagnosis not present

## 2015-01-26 DIAGNOSIS — R2681 Unsteadiness on feet: Secondary | ICD-10-CM

## 2015-01-26 DIAGNOSIS — M6281 Muscle weakness (generalized): Secondary | ICD-10-CM | POA: Diagnosis not present

## 2015-01-26 DIAGNOSIS — Z96641 Presence of right artificial hip joint: Secondary | ICD-10-CM

## 2015-01-26 DIAGNOSIS — Z9660 Presence of unspecified orthopedic joint implant: Secondary | ICD-10-CM | POA: Diagnosis not present

## 2015-01-26 NOTE — Therapy (Signed)
[place], [name], [address] Phone: [phone]   Fax:  [phone]  Physical Therapy Treatment (Re-Assessment)  Patient Details  Name: Mackenzie [name] MRN: [id number] Date of Birth: 1940/11/12 Referring Provider: Elsie Saas, MD  Encounter Date: 01/26/2015      PT End of Session - 01/26/15 1349    Visit Number 10   Number of Visits 22   Date for PT Re-Evaluation 02/23/15   Authorization Type Medicare, [place], [employer]    Authorization Time Period 01/01/15 to 03/03/15 (G-code done 10th visit)   Authorization - Visit Number 10   Authorization - Number of Visits 20   PT Start Time 1301   PT Stop Time 1347   PT Time Calculation (min) 46 min   Activity Tolerance Patient tolerated treatment well   Behavior During Therapy [facility] for tasks assessed/performed      Past Medical History  Diagnosis Date  . Hyperlipidemia     takes Pravastatin daily  . CHF (congestive heart failure) ([place])   . Anxiety     takes Xanax daily as needed  . Hypertension     takes Metoprolol and Azor daily  . Depression     takes Citalopram daily  . Rheumatic fever     hx of  . Wheezing on expiration   . Pneumonia     hx of-2015  . History of bronchitis     2014  . History of migraine   . Dizziness     cardiologist is aware and told pt it was related to meds  . Arthritis   . Joint pain   . History of colon polyps     benign  . Anemia, normocytic normochromic 01/2012    hx of  . Cataract     right eye    Past Surgical History  Procedure Laterality Date  . Cholecystectomy    . Abdominal hysterectomy    . Colonoscopy  2005    Negative screening study  . Colonoscopy N/A 04/04/2014    Procedure: COLONOSCOPY;  Surgeon: Rogene Houston, MD;  [facility];  Service: Endoscopy;  Laterality: N/A;  1225  . Exploratory laparotomy      x 3  . Cardiac catheterization      early 2000's  . Appendectomy    . Cataract surgery  Left   . Total hip arthroplasty Right 12/16/2014    Procedure: TOTAL HIP ARTHROPLASTY ANTERIOR APPROACH;  Surgeon: Renette Butters, MD;  Location: [facility];  Service: Orthopedics;  Laterality: Right;    There were no vitals filed for this visit.  Visit Diagnosis:  Right hip pain  Muscle weakness  Unsteadiness  Difficulty walking  Status post right hip replacement      Subjective Assessment - 01/26/15 1302    Subjective Patient reports she feels like she has been doing very well; reports she went on a long trip yesterday and is having increased pain since, maybe 4/10. Hardest thing to do is picking up her right leg. She reports she has not had any major trouble with stairs or balance. She does report that she is still having some pain with functional activities.    Pertinent History Patient had her R hip replaced by anterior approach on November 1st; patient received HHPT after surgery and has been discharged from their services. Arrived using rolling walker today.    How long can you sit comfortably? 12/12- still no problems  How long can you stand comfortably? 12/12- can do [employer] or around perimeter of the store, but really has a lot of muscle pain and swelling after    How long can you walk comfortably? 12/12- 60 minutes still max    Currently in Pain? Yes   Pain Score 4    Pain Location Hip   Pain Orientation Right            [facility] PT Assessment - 01/26/15 0001    Observation/Other Assessments   Focus on Therapeutic Outcomes (FOTO)  56% limited    Strength   Right Hip Flexion 2/5   Right Hip ABduction 3/5   Left Hip Flexion 4-/5   Right Knee Flexion 4-/5   Right Knee Extension 4-/5   Left Knee Flexion 4/5   Left Knee Extension 4+/5   Right Ankle Dorsiflexion 4+/5   Left Ankle Dorsiflexion 5/5   Transfers   Five time sit to stand comments  14 seconds    6 minute walk test results    Aerobic Endurance Distance Walked 1074   Endurance additional comments 6MWT, no  device; gait speed 0.21m/s    Timed Up and Go Test   TUG Comments 8.8, 8.8, 8.5; no device                      [facility] Adult PT Treatment/Exercise - 01/26/15 0001    Knee/Hip Exercises: Stretches   Active Hamstring Stretch Both;1 rep   Active Hamstring Stretch Limitations 90 seconds    Gastroc Stretch Both;1 rep   Gastroc Stretch Limitations 90 seconds                 PT Education - 01/26/15 1349    Education provided Yes   Education Details progress wtih skilled PT services, plan of care moving forward, purpose of re-assessment days in terms of adjusting to progress and meeting insurance requirements, difference in skill/quality between [facility] group classes and skilled PT sessions    Person(s) Educated Patient   Methods Explanation   Comprehension Verbalized understanding          PT Short Term Goals - 01/26/15 1334    PT SHORT TERM GOAL #1   Title Patient will experience no more than 1/10 pain in her R hip when performing functional standing and ambulation based tasks for at least 60 minutes in order to increase independence and overall mobilty    Baseline 12/12- doesn't really feel pain during but does feel it after ranging from 6-7/10, still working on duration of standing/walking    Time 3   Period Weeks   Status On-going   PT SHORT TERM GOAL #2   Title Patient to be able to ambualate safely with single point cane with good sequencing, minimal unsteadiness, and good posture in order to enhance independence and mobility    Time 3   Period Weeks   Status Achieved   PT SHORT TERM GOAL #3   Title Patient to be able to peform five times sit to stand in less than 12 seconds in order to demonstrate improved muscle power and reduced overall fall risk    Baseline 12/12- 14 seconds    Time 3   Period Weeks   Status On-going   PT SHORT TERM GOAL #4   Title Patient to be independent in correctly and consistently performing appropriate HEP, to be updated PRN     Baseline 12/12- patient reports she is not doing it like  she should; does some every day but not continuous and does not set aside specific time during day to do it    Time 3   Period Weeks   Status On-going           PT Long Term Goals - 01/26/15 1337    PT LONG TERM GOAL #1   Title Patient to demonstrate at least 4+/5 strength in all tested muscle groups in order to reduce pain, improve mobility, and enhance overall functional stability to reduce fall risk    Time 6   Period Weeks   Status On-going   PT LONG TERM GOAL #2   Title Patient will be able to ambulate at least 135ft on 6MWT with no assistive device, minimal unsteadiness, and no fatigue in order to demonstrate ability to safely navigate through community without assistive device and with overall reduced fall risk    Baseline 12/12- able to do 6MWT without device or fatigue but only going 1075ft    Time 6   Period Weeks   Status Partially Met   PT LONG TERM GOAL #3   Title Patient will be able to perform TUG in 9 seconds or less with no assistive device in order to demonstrate improved overall mobiltiy and greatly reduced fall risk    Time 6   Period Weeks   Status Achieved   PT LONG TERM GOAL #4   Title Patient to report that she has been able to perform at least 30 minutes of moderate level physical activity at least 3 times per week in order to maintain functional gains and facilitate improved overall health habits    Baseline 12/12- patient has not been doing a lot outside of PT and HEP and walking in stores    Time 6   Period Weeks   Status On-going   PT LONG TERM GOAL #5   Title Patient will be able to perform sit to stand/stand to sit from 10-12 inch stool with independence and good sequencing and mechanics so that she is able to more easily reach items on the floor in order to more easily perform cleaning tasks and general home maintenance    Time 6   Period Weeks   Status New               Plan -  01/26/15 1351    Clinical Impression Statement Re-assessment performed today. Patient shows significant functional gains in her functional strength, gait mechanics, balance, muscle power, and overall functional task performance at this time; patient does however continue to demonstrate significant functional impairments in functional strength, gait speed/distance, muscle power, and reports she is having some trouble at home with cleaning and generally keeping up her home as she is not able to sit down on or stand up from 10-12 inch box to reach teh floor. Patient is very pleased with her current progress with skilled PT services and is motivated to keep working with skilled PT staff on remaining functional impairments. At this time she will benefit from skilled PT services in order to address functional limitations and assist her in reaching an optimal level of function.    Pt will benefit from skilled therapeutic intervention in order to improve on the following deficits Abnormal gait;Decreased activity tolerance;Decreased strength;Pain;Decreased balance;Decreased mobility;Difficulty walking;Improper body mechanics;Decreased coordination;Impaired flexibility;Postural dysfunction   Rehab Potential Excellent   PT Frequency 3x / week   PT Duration 6 weeks   PT Treatment/Interventions ADLs/Self Care Home Management;Cryotherapy;DME Instruction;Gait training;Stair training;Functional  mobility training;Therapeutic activities;Therapeutic exercise;Balance training;Neuromuscular re-education;Patient/family education;Manual techniques   PT Next Visit Plan continue to progress anteriro hip strength , quads, and balance. Work on sit to stand from low surfaces.    Consulted and Agree with Plan of Care Patient          G-Codes - 02/13/2015 1355    Functional Assessment Tool Used FOTO 56% limited    Functional Limitation Mobility: Walking and moving around   Mobility: Walking and Moving Around Current Status [phone])  At least 40 percent but less than 60 percent impaired, limited or restricted   Mobility: Walking and Moving Around Goal Status [phone]) At least 40 percent but less than 60 percent impaired, limited or restricted      Problem List Patient Active Problem List   Diagnosis Date Noted  . Primary osteoarthritis of right hip 12/16/2014  . Postoperative anemia due to acute blood loss 12/16/2014  . Bilateral carotid artery disease ([place]) 11/05/2014  . Multiple fractures of ribs of right side 06/18/2012  . Pneumothorax on right 06/18/2012  . Unstable angina ([place]) 01/31/2012  . Cerebrovascular disease 01/31/2012  . Arteriosclerotic cardiovascular disease (ASCVD)   . Hyperlipidemia   . Hypertension   . Anemia, normocytic normochromic 01/15/2012  . MITRAL VALVE PROLAPSE 12/24/2008    Physical Therapy Progress Note  Dates of Reporting Period: 01/01/15 to 13-Feb-2015  Objective Reports of Subjective Statement: see above   Objective Measurements: see above   Goal Update: see above   Plan: see above   Reason Skilled Services are Required: functional strength, gait training, functional task performance training, balance, muscle power, develop advanced HEP    Deniece Ree PT, DPT [place], [name], [address] Phone: [phone]   Fax:  [phone]  Name: Mackenzie [name] MRN: [id number] Date of Birth: 31-Jul-1940

## 2015-01-27 ENCOUNTER — Ambulatory Visit (HOSPITAL_COMMUNITY): Payer: Medicare Other | Admitting: Physical Therapy

## 2015-01-27 DIAGNOSIS — M25551 Pain in right hip: Secondary | ICD-10-CM

## 2015-01-27 DIAGNOSIS — M6281 Muscle weakness (generalized): Secondary | ICD-10-CM

## 2015-01-27 DIAGNOSIS — R2681 Unsteadiness on feet: Secondary | ICD-10-CM

## 2015-01-27 DIAGNOSIS — Z96641 Presence of right artificial hip joint: Secondary | ICD-10-CM

## 2015-01-27 DIAGNOSIS — Z9660 Presence of unspecified orthopedic joint implant: Secondary | ICD-10-CM | POA: Diagnosis not present

## 2015-01-27 DIAGNOSIS — R262 Difficulty in walking, not elsewhere classified: Secondary | ICD-10-CM | POA: Diagnosis not present

## 2015-01-27 NOTE — Therapy (Signed)
[place], [name], [address] Phone: [phone]   Fax:  [phone]  Physical Therapy Treatment  Patient Details  Name: Mackenzie [name] MRN: [id number] Date of Birth: 1940/03/24 Referring Provider: Elsie Saas, MD  Encounter Date: 01/27/2015      PT End of Session - 01/27/15 1240    Visit Number 11   Number of Visits 22   Date for PT Re-Evaluation 02/23/15   Authorization Type Medicare, [place], [employer]    Authorization Time Period 01/01/15 to 03/03/15 (G-code done 10th visit)   Authorization - Visit Number 11   Authorization - Number of Visits 20   PT Start Time 0935   PT Stop Time 1017   PT Time Calculation (min) 42 min   Activity Tolerance Patient tolerated treatment well   Behavior During Therapy [facility] for tasks assessed/performed      Past Medical History  Diagnosis Date  . Hyperlipidemia     takes Pravastatin daily  . CHF (congestive heart failure) ([place])   . Anxiety     takes Xanax daily as needed  . Hypertension     takes Metoprolol and Azor daily  . Depression     takes Citalopram daily  . Rheumatic fever     hx of  . Wheezing on expiration   . Pneumonia     hx of-2015  . History of bronchitis     2014  . History of migraine   . Dizziness     cardiologist is aware and told pt it was related to meds  . Arthritis   . Joint pain   . History of colon polyps     benign  . Anemia, normocytic normochromic 01/2012    hx of  . Cataract     right eye    Past Surgical History  Procedure Laterality Date  . Cholecystectomy    . Abdominal hysterectomy    . Colonoscopy  2005    Negative screening study  . Colonoscopy N/A 04/04/2014    Procedure: COLONOSCOPY;  Surgeon: Rogene Houston, MD;  [facility];  Service: Endoscopy;  Laterality: N/A;  1225  . Exploratory laparotomy      x 3  . Cardiac catheterization      early 2000's  . Appendectomy    . Cataract surgery Left   . Total hip  arthroplasty Right 12/16/2014    Procedure: TOTAL HIP ARTHROPLASTY ANTERIOR APPROACH;  Surgeon: Renette Butters, MD;  Location: [facility];  Service: Orthopedics;  Laterality: Right;    There were no vitals filed for this visit.  Visit Diagnosis:  Right hip pain  Muscle weakness  Unsteadiness  Difficulty walking  Status post right hip replacement      Subjective Assessment - 01/26/15 1302    Subjective Patient reports she feels like she has been doing very well; reports she went on a long trip yesterday and is having increased pain since, maybe 4/10. Hardest thing to do is picking up her right leg. She reports she has not had any major trouble with stairs or balance. She does report that she is still having some pain with functional activities.    Pertinent History Patient had her R hip replaced by anterior approach on November 1st; patient received HHPT after surgery and has been discharged from their services. Arrived using rolling walker today.    How long can you sit comfortably? 12/12- still no problems    How  long can you stand comfortably? 12/12- can do [employer] or around perimeter of the store, but really has a lot of muscle pain and swelling after    How long can you walk comfortably? 12/12- 60 minutes still max    Currently in Pain? Yes   Pain Score 4    Pain Location Hip   Pain Orientation Right                         [facility] Adult PT Treatment/Exercise - 01/27/15 0941    Knee/Hip Exercises: Stretches   Active Hamstring Stretch Both;2 reps;30 seconds   Gastroc Stretch Both;2 reps;30 seconds   Knee/Hip Exercises: Standing   Heel Raises 20 reps   Heel Raises Limitations toe raises on 2in step; incline slope with heel raises   Lateral Step Up Right;15 reps;Hand Hold: 1;Step Height: 6"   Forward Step Up Right;Hand Hold: 1;Step Height: 6";15 reps   Functional Squat 15 reps   Stairs 3Rt ascend 7in and descend 4in step   SLS with Vectors 5X3" bilaterally    Knee/Hip Exercises: Seated   Sit to Sand 10 reps;without UE support  14" box                PT Education - 01/26/15 1349    Education provided Yes   Education Details progress wtih skilled PT services, plan of care moving forward, purpose of re-assessment days in terms of adjusting to progress and meeting insurance requirements, difference in skill/quality between [facility] group classes and skilled PT sessions    Person(s) Educated Patient   Methods Explanation   Comprehension Verbalized understanding          PT Short Term Goals - 01/26/15 1334    PT SHORT TERM GOAL #1   Title Patient will experience no more than 1/10 pain in her R hip when performing functional standing and ambulation based tasks for at least 60 minutes in order to increase independence and overall mobilty    Baseline 12/12- doesn't really feel pain during but does feel it after ranging from 6-7/10, still working on duration of standing/walking    Time 3   Period Weeks   Status On-going   PT SHORT TERM GOAL #2   Title Patient to be able to ambualate safely with single point cane with good sequencing, minimal unsteadiness, and good posture in order to enhance independence and mobility    Time 3   Period Weeks   Status Achieved   PT SHORT TERM GOAL #3   Title Patient to be able to peform five times sit to stand in less than 12 seconds in order to demonstrate improved muscle power and reduced overall fall risk    Baseline 12/12- 14 seconds    Time 3   Period Weeks   Status On-going   PT SHORT TERM GOAL #4   Title Patient to be independent in correctly and consistently performing appropriate HEP, to be updated PRN    Baseline 12/12- patient reports she is not doing it like she should; does some every day but not continuous and does not set aside specific time during day to do it    Time 3   Period Weeks   Status On-going           PT Long Term Goals - 01/26/15 1337    PT LONG TERM GOAL #1   Title  Patient to demonstrate at least 4+/5 strength in all tested muscle  groups in order to reduce pain, improve mobility, and enhance overall functional stability to reduce fall risk    Time 6   Period Weeks   Status On-going   PT LONG TERM GOAL #2   Title Patient will be able to ambulate at least 139f on 6MWT with no assistive device, minimal unsteadiness, and no fatigue in order to demonstrate ability to safely navigate through community without assistive device and with overall reduced fall risk    Baseline 112-21-24 able to do 6MWT without device or fatigue but only going 10760f   Time 6   Period Weeks   Status Partially Met   PT LONG TERM GOAL #3   Title Patient will be able to perform TUG in 9 seconds or less with no assistive device in order to demonstrate improved overall mobiltiy and greatly reduced fall risk    Time 6   Period Weeks   Status Achieved   PT LONG TERM GOAL #4   Title Patient to report that she has been able to perform at least 30 minutes of moderate level physical activity at least 3 times per week in order to maintain functional gains and facilitate improved overall health habits    Baseline 1212-21-24patient has not been doing a lot outside of PT and HEP and walking in stores    Time 6   Period Weeks   Status On-going   PT LONG TERM GOAL #5   Title Patient will be able to perform sit to stand/stand to sit from 10-12 inch stool with independence and good sequencing and mechanics so that she is able to more easily reach items on the floor in order to more easily perform cleaning tasks and general home maintenance    Time 6   Period Weeks   Status New               Plan - 01/27/15 1241    Clinical Impression Statement continued functional strength progression.  Added sit to stands from lowered height of 14" with progression to 10" for stool at home.  Mentioned patient may want to bring stool in from home to work on as well.  Added vector stance to work on stability  and progressed to 7" step with forward and lateral step ups.    Pt will benefit from skilled therapeutic intervention in order to improve on the following deficits Abnormal gait;Decreased activity tolerance;Decreased strength;Pain;Decreased balance;Decreased mobility;Difficulty walking;Improper body mechanics;Decreased coordination;Impaired flexibility;Postural dysfunction   Rehab Potential Excellent   PT Frequency 3x / week   PT Duration 6 weeks   PT Treatment/Interventions ADLs/Self Care Home Management;Cryotherapy;DME Instruction;Gait training;Stair training;Functional mobility training;Therapeutic activities;Therapeutic exercise;Balance training;Neuromuscular re-education;Patient/family education;Manual techniques   PT Next Visit Plan continue to progress anteriro hip strength , quads, and balance. Add balance beam and side stepping with theraband next session.    Consulted and Agree with Plan of Care Patient          G-Codes - 1221-Dec-2016355    Functional Assessment Tool Used FOTO 56% limited    Functional Limitation Mobility: Walking and moving around   Mobility: Walking and Moving Around Current Status ([phone] least 40 percent but less than 60 percent impaired, limited or restricted   Mobility: Walking and Moving Around Goal Status ([phone] least 40 percent but less than 60 percent impaired, limited or restricted      Problem List Patient Active Problem List   Diagnosis Date Noted  . Primary osteoarthritis of right  hip 12/16/2014  . Postoperative anemia due to acute blood loss 12/16/2014  . Bilateral carotid artery disease ([place]) 11/05/2014  . Multiple fractures of ribs of right side 06/18/2012  . Pneumothorax on right 06/18/2012  . Unstable angina ([place]) 01/31/2012  . Cerebrovascular disease 01/31/2012  . Arteriosclerotic cardiovascular disease (ASCVD)   . Hyperlipidemia   . Hypertension   . Anemia, normocytic normochromic 01/15/2012  . MITRAL VALVE PROLAPSE 12/24/2008     Teena Irani, PTA/CLT [phone]  01/27/2015, 12:44 PM  [place] [address] [place], [name], [address] Phone: [phone]   Fax:  [phone]  Name: Mackenzie [name] MRN: [id number] Date of Birth: 03/15/1940

## 2015-01-28 ENCOUNTER — Encounter (HOSPITAL_COMMUNITY): Payer: Medicare Other

## 2015-01-28 DIAGNOSIS — Z96641 Presence of right artificial hip joint: Secondary | ICD-10-CM | POA: Diagnosis not present

## 2015-01-30 ENCOUNTER — Encounter (HOSPITAL_COMMUNITY): Payer: Medicare Other

## 2015-02-02 ENCOUNTER — Ambulatory Visit (HOSPITAL_COMMUNITY): Payer: Medicare Other | Admitting: Physical Therapy

## 2015-02-02 DIAGNOSIS — Z96641 Presence of right artificial hip joint: Secondary | ICD-10-CM

## 2015-02-02 DIAGNOSIS — M6281 Muscle weakness (generalized): Secondary | ICD-10-CM | POA: Diagnosis not present

## 2015-02-02 DIAGNOSIS — R262 Difficulty in walking, not elsewhere classified: Secondary | ICD-10-CM

## 2015-02-02 DIAGNOSIS — M25551 Pain in right hip: Secondary | ICD-10-CM | POA: Diagnosis not present

## 2015-02-02 DIAGNOSIS — R2681 Unsteadiness on feet: Secondary | ICD-10-CM | POA: Diagnosis not present

## 2015-02-02 DIAGNOSIS — Z9660 Presence of unspecified orthopedic joint implant: Secondary | ICD-10-CM | POA: Diagnosis not present

## 2015-02-02 NOTE — Therapy (Signed)
[place]ackenzie [name], [name], [address] Phone: [phone]   Fax:  [phone]  Physical Therapy Treatment  Patient Details  Name: Mackenzie [name] MRN: [id number] Date of Birth: 1940/09/29 Referring Provider: Elsie Saas, MD  Encounter Date: 02/02/2015      PT End of Session - 02/02/15 1347    Visit Number 12   Number of Visits 22   Date for PT Re-Evaluation 02/23/15   Authorization Type Medicare, [place], [employer]    Authorization Time Period 01/01/15 to 03/03/15 (G-code done 10th visit)   Authorization - Visit Number 12   Authorization - Number of Visits 20   PT Start Time 1302   PT Stop Time 1343   PT Time Calculation (min) 41 min   Activity Tolerance Patient tolerated treatment well   Behavior During Therapy [facility] for tasks assessed/performed      Past Medical History  Diagnosis Date  . Hyperlipidemia     takes Pravastatin daily  . CHF (congestive heart failure) ([place])   . Anxiety     takes Xanax daily as needed  . Hypertension     takes Metoprolol and Azor daily  . Depression     takes Citalopram daily  . Rheumatic fever     hx of  . Wheezing on expiration   . Pneumonia     hx of-2015  . History of bronchitis     2014  . History of migraine   . Dizziness     cardiologist is aware and told pt it was related to meds  . Arthritis   . Joint pain   . History of colon polyps     benign  . Anemia, normocytic normochromic 01/2012    hx of  . Cataract     right eye    Past Surgical History  Procedure Laterality Date  . Cholecystectomy    . Abdominal hysterectomy    . Colonoscopy  2005    Negative screening study  . Colonoscopy N/A 04/04/2014    Procedure: COLONOSCOPY;  Surgeon: Mackenzie Houston, MD;  [facility];  Service: Endoscopy;  Laterality: N/A;  1225  . Exploratory laparotomy      x 3  . Cardiac catheterization      early 2000's  . Appendectomy    . Cataract surgery Left   . Total hip  arthroplasty Right 12/16/2014    Procedure: TOTAL HIP ARTHROPLASTY ANTERIOR APPROACH;  Surgeon: Mackenzie Butters, MD;  Location: [place];  Service: Orthopedics;  Laterality: Right;    There were no vitals filed for this visit.  Visit Diagnosis:  Right hip pain  Muscle weakness  Unsteadiness  Difficulty walking  Status post right hip replacement      Subjective Assessment - 02/02/15 1303    Subjective Patient reports that she is doing well today, no pain and feeling very well    Pertinent History Patient had her R hip replaced by anterior approach on November 1st; patient received HHPT after surgery and has been discharged from their services. Arrived using rolling walker today.    Currently in Pain? No/denies                         [facility] Adult PT Treatment/Exercise - 02/02/15 0001    Knee/Hip Exercises: Stretches   Active Hamstring Stretch Both;3 reps;30 seconds   Active Hamstring Stretch Limitations 12 inch box    Press photographer  Both;3 reps;30 seconds   Knee/Hip Exercises: Standing   Heel Raises 20 reps   Heel Raises Limitations toe raises on 2in step; incline slope with heel raises   Forward Lunges 1 set;10 reps;Right   Forward Lunges Limitations 2 inch box    Side Lunges Both;1 set;10 reps   Side Lunges Limitations 2 inch box    Lateral Step Up Right;15 reps;Hand Hold: 1;Step Height: 6"   Forward Step Up Right;Hand Hold: 1;Step Height: 6";15 reps   Rocker Board Limitations x20AP, x20 lateral U HHA    Other Standing Knee Exercises sit to stand from low surfaces 2x10: 18 inch, 17 inch, 14 inch 1x10 each surface    Other Standing Knee Exercises 3D hip excursions 1x15 (no rotations); horse              Balance Exercises - 02/02/15 1329    Balance Exercises: Standing   Tandem Stance Eyes open;Foam/compliant surface;3 reps;15 secs   SLS Eyes open;Solid surface;3 reps;10 secs   Rockerboard Anterior/posterior;Lateral;Other (comment)  no HHA, close  supervision    Tandem Gait 3 reps;Forward;Retro           PT Education - 02/02/15 1347    Education provided No          PT Short Term Goals - 01/26/15 1334    PT SHORT TERM GOAL #1   Title Patient will experience no more than 1/10 pain in her R hip when performing functional standing and ambulation based tasks for at least 60 minutes in order to increase independence and overall mobilty    Baseline 12/12- doesn't really feel pain during but does feel it after ranging from 6-7/10, still working on duration of standing/walking    Time 3   Period Weeks   Status On-going   PT SHORT TERM GOAL #2   Title Patient to be able to ambualate safely with single point cane with good sequencing, minimal unsteadiness, and good posture in order to enhance independence and mobility    Time 3   Period Weeks   Status Achieved   PT SHORT TERM GOAL #3   Title Patient to be able to peform five times sit to stand in less than 12 seconds in order to demonstrate improved muscle power and reduced overall fall risk    Baseline 12/12- 14 seconds    Time 3   Period Weeks   Status On-going   PT SHORT TERM GOAL #4   Title Patient to be independent in correctly and consistently performing appropriate HEP, to be updated PRN    Baseline 12/12- patient reports she is not doing it like she should; does some every day but not continuous and does not set aside specific time during day to do it    Time 3   Period Weeks   Status On-going           PT Long Term Goals - 01/26/15 1337    PT LONG TERM GOAL #1   Title Patient to demonstrate at least 4+/5 strength in all tested muscle groups in order to reduce pain, improve mobility, and enhance overall functional stability to reduce fall risk    Time 6   Period Weeks   Status On-going   PT LONG TERM GOAL #2   Title Patient will be able to ambulate at least 1317ft on with no assistive device, minimal unsteadiness, and no fatigue in order to demonstrate  ability to safely navigate through community without assistive device and  with overall reduced fall risk    Baseline 12/12- able to do 6MWT without device or fatigue but only going 1020f    Time 6   Period Weeks   Status Partially Met   PT LONG TERM GOAL #3   Title Patient will be able to perform TUG in 9 seconds or less with no assistive device in order to demonstrate improved overall mobiltiy and greatly reduced fall risk    Time 6   Period Weeks   Status Achieved   PT LONG TERM GOAL #4   Title Patient to report that she has been able to perform at least 30 minutes of moderate level physical activity at least 3 times per week in order to maintain functional gains and facilitate improved overall health habits    Baseline 12/12- patient has not been doing a lot outside of PT and HEP and walking in stores    Time 6   Period Weeks   Status On-going   PT LONG TERM GOAL #5   Title Patient will be able to perform sit to stand/stand to sit from 10-12 inch stool with independence and good sequencing and mechanics so that she is able to more easily reach items on the floor in order to more easily perform cleaning tasks and general home maintenance    Time 6   Period Weeks   Status New               Plan - 02/02/15 1347    Clinical Impression Statement Continued with functional strength and introduced more intense balance training today. Patient did have quite a bit of difficulty for sit to stand from 14 inch box today. Introduced horse as well for general LE strengthening. Patient did require one rest break during session, fairly fatiged after session compelte.    Pt will benefit from skilled therapeutic intervention in order to improve on the following deficits Abnormal gait;Decreased activity tolerance;Decreased strength;Pain;Decreased balance;Decreased mobility;Difficulty walking;Improper body mechanics;Decreased coordination;Impaired flexibility;Postural dysfunction   Rehab Potential  Excellent   PT Frequency 3x / week   PT Duration 6 weeks   PT Treatment/Interventions ADLs/Self Care Home Management;Cryotherapy;DME Instruction;Gait training;Stair training;Functional mobility training;Therapeutic activities;Therapeutic exercise;Balance training;Neuromuscular re-education;Patient/family education;Manual techniques   PT Next Visit Plan continue to progress anteriro hip strength , quads, and balance. Add balance beam and side stepping with theraband next session.    PT Home Exercise Plan Added Single Leg Stance   Consulted and Agree with Plan of Care Patient        Problem List Patient Active Problem List   Diagnosis Date Noted  . Primary osteoarthritis of right hip 12/16/2014  . Postoperative anemia due to acute blood loss 12/16/2014  . [place] 11/05/2014  . Multiple fractures of ribs of right side 06/18/2012  . Pneumothorax on right 06/18/2012  . [place]stable [place]ina ([place] 01/31/2012  . Cerebrovascular disease 01/31/2012  . Arteriosclerotic cardiovascular disease (ASCVD)   . Hyperlipidemia   . Hypertension   . Anemia, normocytic normochromic 01/15/2012  . MITRAL VALVE PROLAPSE 12/24/2008    KDeniece ReePT, DPT [address] [id number] [address]Phone: [phone]  Fax:  [phone] Name: AALICYA BENAMRN: [id number] of Birth: 814-Jan-1942

## 2015-02-04 ENCOUNTER — Ambulatory Visit (HOSPITAL_COMMUNITY): Payer: Medicare Other

## 2015-02-04 DIAGNOSIS — R262 Difficulty in walking, not elsewhere classified: Secondary | ICD-10-CM

## 2015-02-04 DIAGNOSIS — Z96641 Presence of right artificial hip joint: Secondary | ICD-10-CM

## 2015-02-04 DIAGNOSIS — M25551 Pain in right hip: Secondary | ICD-10-CM | POA: Diagnosis not present

## 2015-02-04 DIAGNOSIS — Z9660 Presence of unspecified orthopedic joint implant: Secondary | ICD-10-CM | POA: Diagnosis not present

## 2015-02-04 DIAGNOSIS — R2681 Unsteadiness on feet: Secondary | ICD-10-CM | POA: Diagnosis not present

## 2015-02-04 DIAGNOSIS — M6281 Muscle weakness (generalized): Secondary | ICD-10-CM

## 2015-02-04 NOTE — Therapy (Signed)
[place], [name], [address] Phone: [phone]   Fax:  [phone]  Physical Therapy Treatment  Patient Details  Name: Mackenzie [name] MRN: [id number] Date of Birth: Sep 07, 1940 Referring Provider: Elsie Saas, MD  Encounter Date: 02/04/2015      PT End of Session - 02/04/15 1306    Visit Number 13   Number of Visits 22   Date for PT Re-Evaluation 02/23/15   Authorization Type Medicare, [place], [employer]    Authorization Time Period 01/01/15 to 03/03/15 (G-code done 10th visit)   Authorization - Visit Number 13   Authorization - Number of Visits 20   PT Start Time 1301   PT Stop Time 1350   PT Time Calculation (min) 49 min   Activity Tolerance Patient tolerated treatment well   Behavior During Therapy [facility] for tasks assessed/performed      Past Medical History  Diagnosis Date  . Hyperlipidemia     takes Pravastatin daily  . CHF (congestive heart failure) ([place])   . Anxiety     takes Xanax daily as needed  . Hypertension     takes Metoprolol and Azor daily  . Depression     takes Citalopram daily  . Rheumatic fever     hx of  . Wheezing on expiration   . Pneumonia     hx of-2015  . History of bronchitis     2014  . History of migraine   . Dizziness     cardiologist is aware and told pt it was related to meds  . Arthritis   . Joint pain   . History of colon polyps     benign  . Anemia, normocytic normochromic 01/2012    hx of  . Cataract     right eye    Past Surgical History  Procedure Laterality Date  . Cholecystectomy    . Abdominal hysterectomy    . Colonoscopy  2005    Negative screening study  . Colonoscopy N/A 04/04/2014    Procedure: COLONOSCOPY;  Surgeon: Rogene Houston, MD;  [facility];  Service: Endoscopy;  Laterality: N/A;  1225  . Exploratory laparotomy      x 3  . Cardiac catheterization      early 2000's  . Appendectomy    . Cataract surgery Left   . Total hip  arthroplasty Right 12/16/2014    Procedure: TOTAL HIP ARTHROPLASTY ANTERIOR APPROACH;  Surgeon: Renette Butters, MD;  Location: [place];  Service: Orthopedics;  Laterality: Right;    There were no vitals filed for this visit.  Visit Diagnosis:  Right hip pain  Muscle weakness  Unsteadiness  Difficulty walking  Status post right hip replacement      Subjective Assessment - 02/04/15 1259    Subjective Pt stated she over did it last night, cleaned her house and increased pain following    Currently in Pain? Yes   Pain Score 5    Pain Location Hip   Pain Orientation Right   Pain Descriptors / Indicators Sore                         [facility] Adult PT Treatment/Exercise - 02/04/15 0001    Knee/Hip Exercises: Stretches   Active Hamstring Stretch Both;3 reps;30 seconds   Active Hamstring Stretch Limitations 12 inch box    Gastroc Stretch Both;3 reps;30 seconds   Gastroc Stretch Limitations slant board  Knee/Hip Exercises: Standing   Forward Lunges 10 reps;Both   Side Lunges Both;1 set;10 reps   Side Lunges Limitations 2 inch box    SLS Rt 19", Lt 10" max of 3   Other Standing Knee Exercises 6 and 12in hurdles 2RT   Other Standing Knee Exercises sidestepping RTB 2RT   Manual Therapy   Manual Therapy Soft tissue mobilization   Manual therapy comments supine position with LE elevated   Soft tissue mobilization Rt quad             Balance Exercises - 02/04/15 1748    Balance Exercises: Standing   Balance Beam Tandem forward and backwards 2RT   Sidestepping 2 reps;Theraband  RTB             PT Short Term Goals - 01/26/15 1334    PT SHORT TERM GOAL #1   Title Patient will experience no more than 1/10 pain in her R hip when performing functional standing and ambulation based tasks for at least 60 minutes in order to increase independence and overall mobilty    Baseline 12/12- doesn't really feel pain during but does feel it after ranging from 6-7/10,  still working on duration of standing/walking    Time 3   Period Weeks   Status On-going   PT SHORT TERM GOAL #2   Title Patient to be able to ambualate safely with single point cane with good sequencing, minimal unsteadiness, and good posture in order to enhance independence and mobility    Time 3   Period Weeks   Status Achieved   PT SHORT TERM GOAL #3   Title Patient to be able to peform five times sit to stand in less than 12 seconds in order to demonstrate improved muscle power and reduced overall fall risk    Baseline 12/12- 14 seconds    Time 3   Period Weeks   Status On-going   PT SHORT TERM GOAL #4   Title Patient to be independent in correctly and consistently performing appropriate HEP, to be updated PRN    Baseline 12/12- patient reports she is not doing it like she should; does some every day but not continuous and does not set aside specific time during day to do it    Time 3   Period Weeks   Status On-going           PT Long Term Goals - 01/26/15 1337    PT LONG TERM GOAL #1   Title Patient to demonstrate at least 4+/5 strength in all tested muscle groups in order to reduce pain, improve mobility, and enhance overall functional stability to reduce fall risk    Time 6   Period Weeks   Status On-going   PT LONG TERM GOAL #2   Title Patient will be able to ambulate at least 132f on 6MWT with no assistive device, minimal unsteadiness, and no fatigue in order to demonstrate ability to safely navigate through community without assistive device and with overall reduced fall risk    Baseline 12/12- able to do 6MWT without device or fatigue but only going 10796f   Time 6   Period Weeks   Status Partially Met   PT LONG TERM GOAL #3   Title Patient will be able to perform TUG in 9 seconds or less with no assistive device in order to demonstrate improved overall mobiltiy and greatly reduced fall risk    Time 6   Period Weeks  Status Achieved   PT LONG TERM GOAL #4    Title Patient to report that she has been able to perform at least 30 minutes of moderate level physical activity at least 3 times per week in order to maintain functional gains and facilitate improved overall health habits    Baseline 12/12- patient has not been doing a lot outside of PT and HEP and walking in stores    Time 6   Period Weeks   Status On-going   PT LONG TERM GOAL #5   Title Patient will be able to perform sit to stand/stand to sit from 10-12 inch stool with independence and good sequencing and mechanics so that she is able to more easily reach items on the floor in order to more easily perform cleaning tasks and general home maintenance    Time 6   Period [place] - 02/04/15 1749    Clinical Impression Statement Session focus on improving functional strengthening, balance training and manual techniques at end of session for pain control.  Pt continues to demonstrate weak hip flexion strength and decreased balance though is improving.  Pt able to achieve 6 and 12in hurdles with cueing to reduce ER wtih Rt LE upon landing and to increase SLS hold time for balance training.  Began tandem and retro gait on balance beam with min assistance required for LOB and sidestepping with red theraband for gluteal med strengthening.  Ended session with manual soft tissue mobilization technqiues to Rt quadriceps with reports of pain resolved following.  Pt stated main functinal difficulties are putting on socks and shoes and getting in and out of bathtub, begin activities to assist with this next session.     PT Next Visit Plan continue to progress anteriro hip strength , quads, and balance. Continue with hurdles, balance beam and side stepping with theraband next session.  Begin getting in and out of bathtub next session and educated pt on techniques to assist with socks and shoes.        Problem List Patient Active Problem List   Diagnosis Date Noted   . Primary osteoarthritis of right hip 12/16/2014  . Postoperative anemia due to acute blood loss 12/16/2014  . Bilateral carotid artery disease ([place]) 11/05/2014  . Multiple fractures of ribs of right side 06/18/2012  . Pneumothorax on right 06/18/2012  . Unstable angina ([place]) 01/31/2012  . Cerebrovascular disease 01/31/2012  . Arteriosclerotic cardiovascular disease (ASCVD)   . Hyperlipidemia   . Hypertension   . Anemia, normocytic normochromic 01/15/2012  . MITRAL VALVE PROLAPSE 12/24/2008   Ihor Austin, LPTA; [place]  Aldona Lento 02/04/2015, 6:02 PM  [place], [name], [address] Phone: [phone]   Fax:  [phone]  Name: ALEZA PEW MRN: [id number] Date of Birth: Apr 10, 1940

## 2015-02-06 ENCOUNTER — Ambulatory Visit (HOSPITAL_COMMUNITY): Payer: Medicare Other

## 2015-02-06 DIAGNOSIS — M25551 Pain in right hip: Secondary | ICD-10-CM | POA: Diagnosis not present

## 2015-02-06 DIAGNOSIS — M6281 Muscle weakness (generalized): Secondary | ICD-10-CM | POA: Diagnosis not present

## 2015-02-06 DIAGNOSIS — R2681 Unsteadiness on feet: Secondary | ICD-10-CM

## 2015-02-06 DIAGNOSIS — Z96641 Presence of right artificial hip joint: Secondary | ICD-10-CM

## 2015-02-06 DIAGNOSIS — R262 Difficulty in walking, not elsewhere classified: Secondary | ICD-10-CM

## 2015-02-06 DIAGNOSIS — Z9660 Presence of unspecified orthopedic joint implant: Secondary | ICD-10-CM | POA: Diagnosis not present

## 2015-02-06 NOTE — Therapy (Signed)
[place], [name], [address] Phone: [phone]   Fax:  [phone]  Physical Therapy Treatment  Patient Details  Name: Mackenzie [name] MRN: [id number] Date of Birth: November 26, 1940 Referring Provider: Elsie Saas, MD  Encounter Date: 02/06/2015      PT End of Session - 02/06/15 1210    Visit Number 14   Number of Visits 22   Date for PT Re-Evaluation 02/23/15   Authorization Type Medicare, [place], [employer]    Authorization Time Period 01/01/15 to 03/03/15 (G-code done 10th visit)   Authorization - Visit Number 14   Authorization - Number of Visits 20   PT Start Time 1015   PT Stop Time 1108   PT Time Calculation (min) 53 min   Equipment Utilized During Treatment Gait belt   Activity Tolerance Patient tolerated treatment well   Behavior During Therapy [facility] for tasks assessed/performed      Past Medical History  Diagnosis Date  . Hyperlipidemia     takes Pravastatin daily  . CHF (congestive heart failure) ([place])   . Anxiety     takes Xanax daily as needed  . Hypertension     takes Metoprolol and Azor daily  . Depression     takes Citalopram daily  . Rheumatic fever     hx of  . Wheezing on expiration   . Pneumonia     hx of-2015  . History of bronchitis     2014  . History of migraine   . Dizziness     cardiologist is aware and told pt it was related to meds  . Arthritis   . Joint pain   . History of colon polyps     benign  . Anemia, normocytic normochromic 01/2012    hx of  . Cataract     right eye    Past Surgical History  Procedure Laterality Date  . Cholecystectomy    . Abdominal hysterectomy    . Colonoscopy  2005    Negative screening study  . Colonoscopy N/A 04/04/2014    Procedure: COLONOSCOPY;  Surgeon: Rogene Houston, MD;  [facility];  Service: Endoscopy;  Laterality: N/A;  1225  . Exploratory laparotomy      x 3  . Cardiac catheterization      early 2000's  .  Appendectomy    . Cataract surgery Left   . 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  Visit Diagnosis:  Right hip pain  Muscle weakness  Unsteadiness  Difficulty walking  Status post right hip replacement      Subjective Assessment - 02/06/15 1018    Subjective Pt stated she is a little sore anterior hip pain scale 4/10   Currently in Pain? Yes   Pain Score 4    Pain Location Hip   Pain Orientation Right   Pain Descriptors / Indicators Sore            [facility] Adult PT Treatment/Exercise - 02/06/15 0001    Knee/Hip Exercises: Stretches   Active Hamstring Stretch Both;3 reps;30 seconds   Active Hamstring Stretch Limitations 12 inch box    Hip Flexor Stretch 2 reps;30 seconds;Right   Hip Flexor Stretch Limitations 12 in   Piriformis Stretch Right;3 reps;20 seconds  Piriformis Stretch Limitations supine passive stretche SKTC with towel   Gastroc Stretch Both;3 reps;30 seconds   Gastroc Stretch Limitations slant board   Knee/Hip Exercises: Standing   Functional Squat 15 reps   SLS Rt 13", Lt 11" max of 3   Other Standing Knee Exercises 6 and 12in hurdles 2RT; toe tapping 6 in alternating 15x; tap tapping on 5 cones BLE 3RT; floor to standing 2x; in and out of bathtub x 1   Other Standing Knee Exercises sidestepping RTB 2RT;   Knee/Hip Exercises: Supine   Straight Leg Raises 10 reps   Straight Leg Raises Limitations HEP   Knee/Hip Exercises: Prone   Hip Extension Both;10 reps   Hip Extension Limitations quadruped hip extension and fire hydrant   Manual Therapy   Manual Therapy Soft tissue mobilization   Manual therapy comments supine position with LE elevated   Soft tissue mobilization Rt quad, glut med and piriformis             Balance Exercises - 02/06/15 1203     Balance Exercises: Standing   SLS 3 reps   Balance Beam Tandem, retro and sidestepping             PT Short Term Goals - 01/26/15 1334    PT SHORT TERM GOAL #1   Title Patient will experience no more than 1/10 pain in her R hip when performing functional standing and ambulation based tasks for at least 60 minutes in order to increase independence and overall mobilty    Baseline 12/12- doesn't really feel pain during but does feel it after ranging from 6-7/10, still working on duration of standing/walking    Time 3   Period Weeks   Status On-going   PT SHORT TERM GOAL #2   Title Patient to be able to ambualate safely with single point cane with good sequencing, minimal unsteadiness, and good posture in order to enhance independence and mobility    Time 3   Period Weeks   Status Achieved   PT SHORT TERM GOAL #3   Title Patient to be able to peform five times sit to stand in less than 12 seconds in order to demonstrate improved muscle power and reduced overall fall risk    Baseline 12/12- 14 seconds    Time 3   Period Weeks   Status On-going   PT SHORT TERM GOAL #4   Title Patient to be independent in correctly and consistently performing appropriate HEP, to be updated PRN    Baseline 12/12- patient reports she is not doing it like she should; does some every day but not continuous and does not set aside specific time during day to do it    Time 3   Period Weeks   Status On-going           PT Long Term Goals - 01/26/15 1337    PT LONG TERM GOAL #1   Title Patient to demonstrate at least 4+/5 strength in all tested muscle groups in order to reduce pain, improve mobility, and enhance overall functional stability to reduce fall risk    Time 6   Period Weeks   Status On-going   PT LONG TERM GOAL #2   Title Patient will be able to ambulate at least 1330f on 6MWT with no assistive device, minimal unsteadiness, and no fatigue in order to demonstrate ability to safely navigate  through community without assistive device and with overall reduced fall risk    Baseline  12/12- able to do 6MWT without device or fatigue but only going 1037f    Time 6   Period Weeks   Status Partially Met   PT LONG TERM GOAL #3   Title Patient will be able to perform TUG in 9 seconds or less with no assistive device in order to demonstrate improved overall mobiltiy and greatly reduced fall risk    Time 6   Period Weeks   Status Achieved   PT LONG TERM GOAL #4   Title Patient to report that she has been able to perform at least 30 minutes of moderate level physical activity at least 3 times per week in order to maintain functional gains and facilitate improved overall health habits    Baseline 12/12- patient has not been doing a lot outside of PT and HEP and walking in stores    Time 6   Period Weeks   Status On-going   PT LONG TERM GOAL #5   Title Patient will be able to perform sit to stand/stand to sit from 10-12 inch stool with independence and good sequencing and mechanics so that she is able to more easily reach items on the floor in order to more easily perform cleaning tasks and general home maintenance    Time 6   Period Weeks   Status New               Plan - 02/06/15 1210    Clinical Impression Statement Session focus on improving functional strengthening, balance training and manual technqiues for pain control.  Introduced floor to standing and getting in and out of bathtub following reports of difficulty, pt able to complete independently with cueing for technique with moderate difficulty getting out of bathtub, HHA required for floor to stand.  Pt continues to demonstrate weakness with hip flexion, encouraged to begin SLR as HEP for strengthening.  Continued with SLS activities and hip flexion exercises with min assistance required.  SLS continues to be difficulty due to glut weakness.  Ended session with manual soft tissue mobilization to Rt proximal quad, glut med  and piriformis for pain control wtih reports of pain resolved following manual.   PT Next Visit Plan F/u on pain following this session.  COntinue to progress hip strengthening, balance training and manual techniques for pain.  Continue with hurdles, balance activities, side stepping and quadruped for strenghtening.  Piriformis stretches with anterior hip precautions to improve ability to put on socks and shoes.          Problem List Patient Active Problem List   Diagnosis Date Noted  . Primary osteoarthritis of right hip 12/16/2014  . Postoperative anemia due to acute blood loss 12/16/2014  . B[place] disease ([place] 11/05/2014  . Multiple fractures of ribs of right side 06/18/2012  . Pneumothorax on right 06/18/2012  . [place] 01/31[place]  . Cerebrovascular disease 01/31/2012  . Arteriosclerotic cardiovascular disease (ASCVD)   . Hyperlipidemia   . Hypertension   . Anemia, normocytic normochromic 01/15/2012  . MITRAL VALVE PROLAPSE 12/24/2008   CIhor Austin LPTA; [place] CAldona Lento12/23/2016, 12:19 PM  [place] [address]Phone: [phone]  Fax:  [phone] Name: Mackenzie AHLGRIMMRN: [id number] of Birth: 804/12/1940

## 2015-02-10 ENCOUNTER — Ambulatory Visit (HOSPITAL_COMMUNITY): Payer: Medicare Other | Admitting: Physical Therapy

## 2015-02-11 ENCOUNTER — Ambulatory Visit (HOSPITAL_COMMUNITY): Payer: Medicare Other

## 2015-02-11 DIAGNOSIS — M6281 Muscle weakness (generalized): Secondary | ICD-10-CM | POA: Diagnosis not present

## 2015-02-11 DIAGNOSIS — R262 Difficulty in walking, not elsewhere classified: Secondary | ICD-10-CM

## 2015-02-11 DIAGNOSIS — R2681 Unsteadiness on feet: Secondary | ICD-10-CM | POA: Diagnosis not present

## 2015-02-11 DIAGNOSIS — M25551 Pain in right hip: Secondary | ICD-10-CM | POA: Diagnosis not present

## 2015-02-11 DIAGNOSIS — Z9660 Presence of unspecified orthopedic joint implant: Secondary | ICD-10-CM | POA: Diagnosis not present

## 2015-02-11 DIAGNOSIS — Z96641 Presence of right artificial hip joint: Secondary | ICD-10-CM

## 2015-02-11 NOTE — Therapy (Signed)
[place], [name], [address] Phone: [phone]   Fax:  [phone]  Physical Therapy Treatment  Patient Details  Name: Mackenzie [name] MRN: [id number] Date of Birth: April 19, 1940 Referring Provider: Elsie Saas, MD  Encounter Date: 02/11/2015      PT End of Session - 02/11/15 1306    Visit Number 15   Number of Visits 22   Date for PT Re-Evaluation 02/23/15   Authorization Type Medicare, [place], [employer]    Authorization Time Period 01/01/15 to 03/03/15 (G-code done 10th visit)   Authorization - Visit Number 15   Authorization - Number of Visits 20   PT Start Time 1302   PT Stop Time 1350   PT Time Calculation (min) 48 min   Equipment Utilized During Treatment Gait belt   Activity Tolerance Patient tolerated treatment well   Behavior During Therapy [facility] for tasks assessed/performed      Past Medical History  Diagnosis Date  . Hyperlipidemia     takes Pravastatin daily  . CHF (congestive heart failure) ([place])   . Anxiety     takes Xanax daily as needed  . Hypertension     takes Metoprolol and Azor daily  . Depression     takes Citalopram daily  . Rheumatic fever     hx of  . Wheezing on expiration   . Pneumonia     hx of-2015  . History of bronchitis     2014  . History of migraine   . Dizziness     cardiologist is aware and told pt it was related to meds  . Arthritis   . Joint pain   . History of colon polyps     benign  . Anemia, normocytic normochromic 01/2012    hx of  . Cataract     right eye    Past Surgical History  Procedure Laterality Date  . Cholecystectomy    . Abdominal hysterectomy    . Colonoscopy  2005    Negative screening study  . Colonoscopy N/A 04/04/2014    Procedure: COLONOSCOPY;  Surgeon: Rogene Houston, MD;  [facility];  Service: Endoscopy;  Laterality: N/A;  1225  . Exploratory laparotomy      x 3  . Cardiac catheterization      early 2000's  .  Appendectomy    . Cataract surgery Left   . 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  Visit Diagnosis:  Right hip pain  Muscle weakness  Unsteadiness  Difficulty walking  Status post right hip replacement      Subjective Assessment - 02/11/15 1304    Subjective Pt stated she is feeling good today, her throat hurts and eczema has increased on her head and back.  Hip is feeling good today.  Has apt with dermatalogist.   Currently in Pain? No/denies                         [facility] Adult PT Treatment/Exercise - 02/11/15 0001    Knee/Hip Exercises: Stretches   Active Hamstring Stretch Both;3 reps;30 seconds   Active Hamstring Stretch Limitations 12 inch box    Piriformis Stretch Right;3 reps;20 seconds   Piriformis Stretch Limitations supine passive stretche  SKTC with towel   Gastroc Stretch Both;3 reps;30 seconds   Gastroc Stretch Limitations slant board   Knee/Hip Exercises: Standing   Functional Squat 15 reps   Stairs 4RT ascend and descend reciprocal pattern 7in step   SLS RT 18" Lt 10" max of 3   Other Standing Knee Exercises 6 and 12in hurdles forward and sidestepping 2RT, 20x alternating toe tapping on 8in step; 6 cone tapping   Other Standing Knee Exercises sidestepping RTB 2RT;   Knee/Hip Exercises: Prone   Hip Extension Both;10 reps   Hip Extension Limitations quadruped hip extension and fire hydrant   Manual Therapy   Manual Therapy Soft tissue mobilization   Manual therapy comments supine position with LE elevated   Soft tissue mobilization Rt quad, glut med and piriformis            PT Short Term Goals - 01/26/15 1334    PT SHORT TERM GOAL #1   Title Patient will experience no more than 1/10 pain in her R hip when performing functional standing and  ambulation based tasks for at least 60 minutes in order to increase independence and overall mobilty    Baseline 12/12- doesn't really feel pain during but does feel it after ranging from 6-7/10, still working on duration of standing/walking    Time 3   Period Weeks   Status On-going   PT SHORT TERM GOAL #2   Title Patient to be able to ambualate safely with single point cane with good sequencing, minimal unsteadiness, and good posture in order to enhance independence and mobility    Time 3   Period Weeks   Status Achieved   PT SHORT TERM GOAL #3   Title Patient to be able to peform five times sit to stand in less than 12 seconds in order to demonstrate improved muscle power and reduced overall fall risk    Baseline 12/12- 14 seconds    Time 3   Period Weeks   Status On-going   PT SHORT TERM GOAL #4   Title Patient to be independent in correctly and consistently performing appropriate HEP, to be updated PRN    Baseline 12/12- patient reports she is not doing it like she should; does some every day but not continuous and does not set aside specific time during day to do it    Time 3   Period Weeks   Status On-going           PT Long Term Goals - 01/26/15 1337    PT LONG TERM GOAL #1   Title Patient to demonstrate at least 4+/5 strength in all tested muscle groups in order to reduce pain, improve mobility, and enhance overall functional stability to reduce fall risk    Time 6   Period Weeks   Status On-going   PT LONG TERM GOAL #2   Title Patient will be able to ambulate at least 1321f on 6MWT with no assistive device, minimal unsteadiness, and no fatigue in order to demonstrate ability to safely navigate through community without assistive device and with overall reduced fall risk    Baseline 12/12- able to do 6MWT without device or fatigue but only going 10723f   Time 6   Period Weeks   Status Partially Met   PT LONG TERM GOAL #3   Title Patient will be able to perform TUG  in 9 seconds or less with no assistive device in order to demonstrate improved overall mobiltiy and greatly  reduced fall risk    Time 6   Period Weeks   Status Achieved   PT LONG TERM GOAL #4   Title Patient to report that she has been able to perform at least 30 minutes of moderate level physical activity at least 3 times per week in order to maintain functional gains and facilitate improved overall health habits    Baseline 12/12- patient has not been doing a lot outside of PT and HEP and walking in stores    Time 6   Period Weeks   Status On-going   PT LONG TERM GOAL #5   Title Patient will be able to perform sit to stand/stand to sit from 10-12 inch stool with independence and good sequencing and mechanics so that she is able to more easily reach items on the floor in order to more easily perform cleaning tasks and general home maintenance    Time 6   Period Weeks   Status New               Plan - 02/11/15 1407    Clinical Impression Statement Contined session focus on improving functional strengthening primarly with gluteal and hip flexion musculature and balance training.  Pt continues to demonstrate balance deficits requireing assistance to reduce risk of falls due to gluteal weakness.  Pt improving functional strengthening wiht abiltiy to ascend and descend stairs reciprocal pattern with intermittent HHA descending stairs, no HHA required ascending.  This session limited by pt not feeling good in throat and eczema bothering her back and head.  Ended session with manual technqiues to reduce tightness and fascial restrictions over incision.  No reports of pain at end of sessionl.   PT Next Visit Plan COntinue to progress hip strengthening, balance training and manual techniques for pain.  Continue with hurdles, balance activities, side stepping and quadruped for strenghtening.  Piriformis stretches with anterior hip precautions to improve ability to put on socks and shoes.           Problem List Patient Active Problem List   Diagnosis Date Noted  . Primary osteoarthritis of right hip 12/16/2014  . Postoperative anemia due to acute blood loss 12/16/2014  . Bilateral carotid artery disease ([place]) 11/05/2014  . Multiple fractures of ribs of right side 06/18/2012  . Pneumothorax on right 06/18/2012  . Unstable angina ([place]) 01/31/2012  . Cerebrovascular disease 01/31/2012  . Arteriosclerotic cardiovascular disease (ASCVD)   . Hyperlipidemia   . Hypertension   . Anemia, normocytic normochromic 01/15/2012  . MITRAL VALVE PROLAPSE 12/24/2008   Ihor Austin, [place]; [place]  Aldona Lento 02/11/2015, 2:13 PM  [place], [name], [address] Phone: [phone]   Fax:  [phone]  Name: Mackenzie [name] MRN: [id number] Date of Birth: 04/04/1940

## 2015-02-12 DIAGNOSIS — B86 Scabies: Secondary | ICD-10-CM | POA: Diagnosis not present

## 2015-02-13 ENCOUNTER — Ambulatory Visit (HOSPITAL_COMMUNITY): Payer: Medicare Other

## 2015-02-18 ENCOUNTER — Ambulatory Visit (HOSPITAL_COMMUNITY): Payer: Medicare Other | Attending: Orthopedic Surgery

## 2015-02-18 DIAGNOSIS — R262 Difficulty in walking, not elsewhere classified: Secondary | ICD-10-CM | POA: Diagnosis not present

## 2015-02-18 DIAGNOSIS — M25551 Pain in right hip: Secondary | ICD-10-CM | POA: Insufficient documentation

## 2015-02-18 DIAGNOSIS — R2681 Unsteadiness on feet: Secondary | ICD-10-CM | POA: Diagnosis not present

## 2015-02-18 DIAGNOSIS — M6281 Muscle weakness (generalized): Secondary | ICD-10-CM | POA: Diagnosis not present

## 2015-02-18 DIAGNOSIS — Z96641 Presence of right artificial hip joint: Secondary | ICD-10-CM

## 2015-02-18 DIAGNOSIS — Z9660 Presence of unspecified orthopedic joint implant: Secondary | ICD-10-CM | POA: Diagnosis not present

## 2015-02-18 NOTE — Therapy (Signed)
[place], [name], [address] Phone: [phone]   Fax:  [phone]  Physical Therapy Treatment  Patient Details  Name: Mackenzie [name] MRN: [id number] Date of Birth: 1940/11/05 Referring Provider: Elsie Saas, MD  Encounter Date: 02/18/2015      PT End of Session - 02/18/15 1355    Visit Number 16   Number of Visits 22   Date for PT Re-Evaluation 02/23/15   Authorization Type Medicare, [place], [employer]    Authorization Time Period 01/01/15 to 03/03/15 (G-code done 10th visit)   Authorization - Visit Number 16   Authorization - Number of Visits 20   PT Start Time 1350   PT Stop Time 1432   PT Time Calculation (min) 42 min   Equipment Utilized During Treatment Gait belt   Activity Tolerance Patient tolerated treatment well   Behavior During Therapy [facility] for tasks assessed/performed      Past Medical History  Diagnosis Date  . Hyperlipidemia     takes Pravastatin daily  . CHF (congestive heart failure) ([place])   . Anxiety     takes Xanax daily as needed  . Hypertension     takes Metoprolol and Azor daily  . Depression     takes Citalopram daily  . Rheumatic fever     hx of  . Wheezing on expiration   . Pneumonia     hx of-2015  . History of bronchitis     2014  . History of migraine   . Dizziness     cardiologist is aware and told pt it was related to meds  . Arthritis   . Joint pain   . History of colon polyps     benign  . Anemia, normocytic normochromic 01/2012    hx of  . Cataract     right eye    Past Surgical History  Procedure Laterality Date  . Cholecystectomy    . Abdominal hysterectomy    . Colonoscopy  2005    Negative screening study  . Colonoscopy N/A 04/04/2014    Procedure: COLONOSCOPY;  Surgeon: Rogene Houston, MD;  [facility];  Service: Endoscopy;  Laterality: N/A;  1225  . Exploratory laparotomy      x 3  . Cardiac catheterization      early 2000's  .  Appendectomy    . Cataract surgery Left   . 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  Visit Diagnosis:  Right hip pain  Muscle weakness  Unsteadiness  Difficulty walking  Status post right hip replacement      Subjective Assessment - 02/18/15 1352    Subjective Pt stated she continues to break out upper part of skin, very itchy all over today, has apt with dermatologist tomorrow.  Current Rt hip is tender today, not bad enough for medication.   Pertinent History Patient had her R hip replaced by anterior approach on November 1st; patient received HHPT after surgery and has been discharged from their services. Arrived using rolling walker today.    Patient Stated Goals get back to PLOF    Currently in Pain? Yes   Pain Score 5    Pain Location Hip   Pain Descriptors / Indicators Tender  [facility] PT Treatment/Exercise - 02/18/15 0001    Knee/Hip Exercises: Stretches   Piriformis Stretch Right;3 reps;20 seconds   Piriformis Stretch Limitations supine passive stretche SKTC with towel   Knee/Hip Exercises: Standing   Functional Squat 15 reps   SLS Rt 32", Lt 20"   Other Standing Knee Exercises 6 and 12in hurdles on balance beam, 20x alternating toe tapping on 8in step; 6 cone tapping   Other Standing Knee Exercises sidestepping RTB 2RT;   Knee/Hip Exercises: Seated   Sit to Sand 5 reps;without UE support  12in step height   Knee/Hip Exercises: Prone   Hip Extension Both;15 reps   Hip Extension Limitations quadruped hip extension and fire hydrant   Manual Therapy   Manual Therapy Soft tissue mobilization   Manual therapy comments supine position with LE elevated   Soft tissue mobilization Rt quad, glut med and piriformis                  PT Short Term  Goals - 01/26/15 1334    PT SHORT TERM GOAL #1   Title Patient will experience no more than 1/10 pain in her R hip when performing functional standing and ambulation based tasks for at least 60 minutes in order to increase independence and overall mobilty    Baseline 12/12- doesn't really feel pain during but does feel it after ranging from 6-7/10, still working on duration of standing/walking    Time 3   Period Weeks   Status On-going   PT SHORT TERM GOAL #2   Title Patient to be able to ambualate safely with single point cane with good sequencing, minimal unsteadiness, and good posture in order to enhance independence and mobility    Time 3   Period Weeks   Status Achieved   PT SHORT TERM GOAL #3   Title Patient to be able to peform five times sit to stand in less than 12 seconds in order to demonstrate improved muscle power and reduced overall fall risk    Baseline 12/12- 14 seconds    Time 3   Period Weeks   Status On-going   PT SHORT TERM GOAL #4   Title Patient to be independent in correctly and consistently performing appropriate HEP, to be updated PRN    Baseline 12/12- patient reports she is not doing it like she should; does some every day but not continuous and does not set aside specific time during day to do it    Time 3   Period Weeks   Status On-going           PT Long Term Goals - 01/26/15 1337    PT LONG TERM GOAL #1   Title Patient to demonstrate at least 4+/5 strength in all tested muscle groups in order to reduce pain, improve mobility, and enhance overall functional stability to reduce fall risk    Time 6   Period Weeks   Status On-going   PT LONG TERM GOAL #2   Title Patient will be able to ambulate at least 136f on 6MWT with no assistive device, minimal unsteadiness, and no fatigue in order to demonstrate ability to safely navigate through community without assistive device and with overall reduced fall risk    Baseline 12/12- able to do 6MWT without  device or fatigue but only going 10728f   Time 6   Period Weeks   Status Partially Met   PT LONG TERM GOAL #3   Title Patient will  be able to perform TUG in 9 seconds or less with no assistive device in order to demonstrate improved overall mobiltiy and greatly reduced fall risk    Time 6   Period Weeks   Status Achieved   PT LONG TERM GOAL #4   Title Patient to report that she has been able to perform at least 30 minutes of moderate level physical activity at least 3 times per week in order to maintain functional gains and facilitate improved overall health habits    Baseline 12/12- patient has not been doing a lot outside of PT and HEP and walking in stores    Time 6   Period Weeks   Status On-going   PT LONG TERM GOAL #5   Title Patient will be able to perform sit to stand/stand to sit from 10-12 inch stool with independence and good sequencing and mechanics so that she is able to more easily reach items on the floor in order to more easily perform cleaning tasks and general home maintenance    Time 6   Period Weeks   Status New               Plan - 02/18/15 1631    Clinical Impression Statement Continued session focus on improving functional strengthening primarly gluteal and hip flexion musculature and balance training.  Pt making gain with increase ease with hip flexion over 12 in hurdles and increased SLS time, though balance continues to be deficit is improving.  Pt limited by skin irritation, stateing very itchy through session, does have apt with dermatologist tomorrow.  Ended session with manual technqiues to required fascial restricitons with noted decreased adhesions following.  Pt reported pain free at end of sessin, reported no longer using [facility] for community gait.     PT Next Visit Plan COntinue to progress hip strengthening, balance training and manual techniques for pain.  Continue with hurdles, balance activities, side stepping and quadruped for strenghtening.   Piriformis stretches with anterior hip precautions to improve ability to put on socks and shoes.          Problem List Patient Active Problem List   Diagnosis Date Noted  . Primary osteoarthritis of right hip 12/16/2014  . Postoperative anemia due to acute blood loss 12/16/2014  . Bilateral carotid artery disease ([place]) 11/05/2014  . Multiple fractures of ribs of right side 06/18/2012  . Pneumothorax on right 06/18/2012  . Unstable angina ([place]) 01/31/2012  . Cerebrovascular disease 01/31/2012  . Arteriosclerotic cardiovascular disease (ASCVD)   . Hyperlipidemia   . Hypertension   . Anemia, normocytic normochromic 01/15/2012  . MITRAL VALVE PROLAPSE 12/24/2008   Ihor Austin, [place]; [place]  Aldona Lento 02/18/2015, 4:50 PM  [place], [name], [address] Phone: [phone]   Fax:  [phone]  Name: Mackenzie [name] MRN: [id number] Date of Birth: May 24, 1940

## 2015-02-19 DIAGNOSIS — R600 Localized edema: Secondary | ICD-10-CM | POA: Diagnosis not present

## 2015-02-19 DIAGNOSIS — L509 Urticaria, unspecified: Secondary | ICD-10-CM | POA: Diagnosis not present

## 2015-02-19 DIAGNOSIS — L986 Other infiltrative disorders of the skin and subcutaneous tissue: Secondary | ICD-10-CM | POA: Diagnosis not present

## 2015-02-20 ENCOUNTER — Ambulatory Visit (HOSPITAL_COMMUNITY): Payer: Medicare Other | Admitting: Physical Therapy

## 2015-02-20 DIAGNOSIS — R2681 Unsteadiness on feet: Secondary | ICD-10-CM | POA: Diagnosis not present

## 2015-02-20 DIAGNOSIS — Z9660 Presence of unspecified orthopedic joint implant: Secondary | ICD-10-CM | POA: Diagnosis not present

## 2015-02-20 DIAGNOSIS — M25551 Pain in right hip: Secondary | ICD-10-CM | POA: Diagnosis not present

## 2015-02-20 DIAGNOSIS — M6281 Muscle weakness (generalized): Secondary | ICD-10-CM

## 2015-02-20 DIAGNOSIS — R262 Difficulty in walking, not elsewhere classified: Secondary | ICD-10-CM | POA: Diagnosis not present

## 2015-02-20 NOTE — Therapy (Signed)
[name], [name], [address] Phone: [phone]   Fax:  [phone]  Physical Therapy Treatment  Patient Details  Name: Mackenzie [name] MRN: [id number] Date of Birth: May 17, 1940 Referring Provider: Elsie Saas, MD  Encounter Date: 02/20/2015      PT End of Session - 02/20/15 1348    Visit Number 17   Number of Visits 20   Date for PT Re-Evaluation 02/23/15   Authorization Time Period 01/01/15 to 03/03/15 (G-code done 10th visit)   Authorization - Visit Number 17   Authorization - Number of Visits 20   PT Start Time 1302   PT Stop Time 1348   PT Time Calculation (min) 46 min   Equipment Utilized During Treatment Gait belt   Activity Tolerance Patient tolerated treatment well      Past Medical History  Diagnosis Date  . Hyperlipidemia     takes Pravastatin daily  . CHF (congestive heart failure) ([place])   . Anxiety     takes Xanax daily as needed  . Hypertension     takes Metoprolol and Azor daily  . Depression     takes Citalopram daily  . Rheumatic fever     hx of  . Wheezing on expiration   . Pneumonia     hx of-2015  . History of bronchitis     2014  . History of migraine   . Dizziness     cardiologist is aware and told pt it was related to meds  . Arthritis   . Joint pain   . History of colon polyps     benign  . Anemia, normocytic normochromic 01/2012    hx of  . Cataract     right eye    Past Surgical History  Procedure Laterality Date  . Cholecystectomy    . Abdominal hysterectomy    . Colonoscopy  2005    Negative screening study  . Colonoscopy N/A 04/04/2014    Procedure: COLONOSCOPY;  Surgeon: Rogene Houston, MD;  [facility];  Service: Endoscopy;  Laterality: N/A;  1225  . Exploratory laparotomy      x 3  . Cardiac catheterization      early 2000's  . Appendectomy    . Cataract surgery Left   . Total hip arthroplasty Right 12/16/2014    Procedure: TOTAL HIP  ARTHROPLASTY ANTERIOR APPROACH;  Surgeon: Renette Butters, MD;  Location: [place];  Service: Orthopedics;  Laterality: Right;    There were no vitals filed for this visit.  Visit Diagnosis:  Right hip pain  Muscle weakness  Unsteadiness  Difficulty walking      Subjective Assessment - 02/20/15 1303    Subjective Pt states that her hip feels good it's a little tender but doing well she is having pain in her right knee but she is suppose to have a TKR on her Rt knee    Pertinent History Patient had her R hip replaced by anterior approach on November 1st;Arrived using no assistive device. .    How long can you sit comfortably?  still no problems    How long can you stand comfortably? able to stand for two hours     How long can you walk comfortably? Able to walk for two hours now was 60 minutes.    Currently in Pain? No/denies            [facility] PT Assessment - 02/20/15 0001  Assessment   Medical Diagnosis R anterior hip replacement    Onset Date/Surgical Date 12/16/14   Next MD Visit 01/28/2015   Precautions   Precautions Anterior Hip   Balance Screen   Has the patient fallen in the past 6 months No   Has the patient had a decrease in activity level because of a fear of falling?  No   Is the patient reluctant to leave their home because of a fear of falling?  No   Prior Function   Level of Independence Independent   Single Leg Stance   Comments Lt 14 seconds; Rt 25 seconds    Strength   Right Hip Flexion 3+/5   Right Hip Extension 4-/5   Right Hip ABduction 4/5  was 3/5    Left Hip Flexion 5/5  was 4-/5    Left Hip Extension 3+/5   Left Hip ABduction 4+/5   Right Knee Flexion 5/5  was 4-/5   Right Knee Extension 4/5  was 4-/5    Left Knee Flexion 5/5  was 4/5   Left Knee Extension 5/5  was 4+/5    Right Ankle Dorsiflexion 5/5  was 4+/5   Left Ankle Dorsiflexion 5/5   Transfers   Five time sit to stand comments  10 seconds was 14 on 12/12/    Ambulation/Gait   Ambulation/Gait --   Ambulation/Gait Assistance --   Ambulation Distance (Feet) --   Assistive device --                     [facility] Adult PT Treatment/Exercise - 02/20/15 0001    Knee/Hip Exercises: Standing   Other Standing Knee Exercises sidestepping RTB 2RT;             Balance Exercises - 02/20/15 1330    Balance Exercises: Standing   Tandem Stance Eyes closed;3 reps;30 secs   SLS 3 reps   Balance Beam Tandem, retro and sidestepping   Sidestepping 2 reps  over hurdles    Sit to Stand Time x5           PT Education - 02/20/15 1348    Education provided Yes   Education Details New HEP   Person(s) Educated Patient   Methods Explanation   Comprehension Verbalized understanding          PT Short Term Goals - 02/20/15 1424    PT SHORT TERM GOAL #1   Title Patient will experience no more than 1/10 pain in her R hip when performing functional standing and ambulation based tasks for at least 60 minutes in order to increase independence and overall mobilty    Period Weeks   Status Achieved   PT SHORT TERM GOAL #2   Title Patient to be able to ambualate safely with single point cane with good sequencing, minimal unsteadiness, and good posture in order to enhance independence and mobility    Time 3   Period Weeks   Status Achieved   PT SHORT TERM GOAL #3   Title Patient to be able to peform five times sit to stand in less than 12 seconds in order to demonstrate improved muscle power and reduced overall fall risk    Time 3   Period Weeks   Status Achieved   PT SHORT TERM GOAL #4   Title Patient to be independent in correctly and consistently performing appropriate HEP, to be updated PRN    Time 3   Period Weeks   Status Achieved  PT Long Term Goals - 02/20/15 1424    PT LONG TERM GOAL #1   Title Patient to demonstrate at least 4+/5 strength in all tested muscle groups in order to reduce pain, improve mobility, and  enhance overall functional stability to reduce fall risk    Time 6   Period Weeks   Status On-going   PT LONG TERM GOAL #2   Title Patient will be able to ambulate at least 1358ft on 6MWT with no assistive device, minimal unsteadiness, and no fatigue in order to demonstrate ability to safely navigate through community without assistive device and with overall reduced fall risk    Time 6   Period Weeks   Status Achieved   PT LONG TERM GOAL #3   Title Patient will be able to perform TUG in 9 seconds or less with no assistive device in order to demonstrate improved overall mobiltiy and greatly reduced fall risk    Time 6   Period Weeks   Status Achieved   PT LONG TERM GOAL #4   Title Patient to report that she has been able to perform at least 30 minutes of moderate level physical activity at least 3 times per week in order to maintain functional gains and facilitate improved overall health habits    Baseline 1   Time 6   Period Weeks   Status Achieved   PT LONG TERM GOAL #5   Title Patient will be able to perform sit to stand/stand to sit from 10-12 inch stool with independence and good sequencing and mechanics so that she is able to more easily reach items on the floor in order to more easily perform cleaning tasks and general home maintenance    Time 6   Period Weeks   Status On-going               Plan - 02/20/15 1350    Clinical Impression Statement Reassessed to see where last three visits should focus on.  Pt has improved in strength and balance but still has deficits in both.  Pt HEp updated.  Pt encouraged to look into going to the [facility] after discharge.    Pt will benefit from skilled therapeutic intervention in order to improve on the following deficits Abnormal gait;Decreased activity tolerance;Decreased strength;Pain;Decreased balance;Decreased mobility;Difficulty walking;Improper body mechanics;Decreased coordination;Impaired flexibility;Postural dysfunction   PT Next  Visit Plan COntinue to progress hip strengthening, balance training and manual techniques for pain.  Continue with hurdles, balance activities, side stepping and quadruped for strenghtening.  Piriformis stretches with anterior hip precautions to improve ability to put on socks and shoes.          Problem List Patient Active Problem List   Diagnosis Date Noted  . Primary osteoarthritis of right hip 12/16/2014  . Postoperative anemia due to acute blood loss 12/16/2014  . Bilateral carotid artery disease ([place]) 11/05/2014  . Multiple fractures of ribs of right side 06/18/2012  . Pneumothorax on right 06/18/2012  . Unstable angina ([place]) 01/31/2012  . Cerebrovascular disease 01/31/2012  . Arteriosclerotic cardiovascular disease (ASCVD)   . Hyperlipidemia   . Hypertension   . Anemia, normocytic normochromic 01/15/2012  . MITRAL VALVE PROLAPSE 12/24/2008  Rayetta Humphrey, PT CLT [phone] 02/20/2015, 2:28 PM  [place] [address] [place], [name], [address] Phone: [phone]   Fax:  [phone]  Name: Mackenzie [name] MRN: [id number] Date of Birth: 1940-10-26   Physical Therapy Progress Note  Dates of  Reporting Period: 01/06/15 to 02/20/2015  Objective Reports of Subjective Statement: see above  Objective Measurements: see above  Goal Update: see above   Plan: Pt feels she will be ready after next week to work on her own.  We will continue to work on HEP, balance and strength next week then discharge to [place] are Required: Pt continues to have decreased balance and strength and strength causing not to be at her maximal functional level.

## 2015-02-20 NOTE — Patient Instructions (Signed)
Strengthening: Straight Leg Raise (Phase 1)    Tighten muscles on front of right thigh, then lift leg __18__ inches from surface, keeping knee locked.  Repeat __15__ times per set. Do __1__ sets per session. Do _2___ sessions per day. Repeat to left http://orth.exer.us/614   Copyright  VHI. All rights reserved.  Straight Leg Raise (Prone)    Abdomen and head supported, keep left knee locked and raise leg at hip. Avoid arching low back. Repeat __15__ times per set. Do _1___ sets per session. Do ___1_ sessions per day. 1 http://orth.exer.us/1112   Copyright  VHI. All rights reserved.  Strengthening: Hip Abduction (Side-Lying)    Tighten muscles on front of right  thigh, then lift leg __15__ inches from surface, keeping knee locked.  Repeat __10-15__ times per set. Do _1___ sets per session. Do __1__ sessions per day.  http://orth.exer.us/622   Copyright  VHI. All rights reserved.  Balance: Unilateral    Attempt to balance on left leg, eyes open. Hold _30___ seconds. Repeat ___3_ times per set. Do ___1_ sets per session. Do ___2_ sessions per day. Perform exercise with eyes closed. Repeat to the right.  http://orth.exer.us/28   Copyright  VHI. All rights reserved.

## 2015-02-23 ENCOUNTER — Ambulatory Visit (HOSPITAL_COMMUNITY): Payer: Medicare Other | Admitting: Physical Therapy

## 2015-02-23 DIAGNOSIS — Z96641 Presence of right artificial hip joint: Secondary | ICD-10-CM

## 2015-02-23 DIAGNOSIS — R262 Difficulty in walking, not elsewhere classified: Secondary | ICD-10-CM

## 2015-02-23 DIAGNOSIS — M25551 Pain in right hip: Secondary | ICD-10-CM

## 2015-02-23 DIAGNOSIS — M6281 Muscle weakness (generalized): Secondary | ICD-10-CM | POA: Diagnosis not present

## 2015-02-23 DIAGNOSIS — R2681 Unsteadiness on feet: Secondary | ICD-10-CM | POA: Diagnosis not present

## 2015-02-23 DIAGNOSIS — Z9660 Presence of unspecified orthopedic joint implant: Secondary | ICD-10-CM | POA: Diagnosis not present

## 2015-02-23 NOTE — Therapy (Signed)
[place], [name], [address] Phone: [phone]   Fax:  [phone]  Physical Therapy Treatment  Patient Details  Name: Mackenzie [name] MRN: [id number] Date of Birth: 06/03/1940 Referring Provider: Elsie Saas, MD  Encounter Date: 02/23/2015      PT End of Session - 02/23/15 1343    Visit Number 18   Number of Visits 20   Authorization Type Medicare, [employer], [place] - Visit Number 18   Authorization - Number of Visits 20   PT Start Time 1300   PT Stop Time 1340   PT Time Calculation (min) 40 min   Equipment Utilized During Treatment Gait belt   Activity Tolerance Patient tolerated treatment well      Past Medical History  Diagnosis Date  . Hyperlipidemia     takes Pravastatin daily  . CHF (congestive heart failure) ([place])   . Anxiety     takes Xanax daily as needed  . Hypertension     takes Metoprolol and Azor daily  . Depression     takes Citalopram daily  . Rheumatic fever     hx of  . Wheezing on expiration   . Pneumonia     hx of-2015  . History of bronchitis     2014  . History of migraine   . Dizziness     cardiologist is aware and told pt it was related to meds  . Arthritis   . Joint pain   . History of colon polyps     benign  . Anemia, normocytic normochromic 01/2012    hx of  . Cataract     right eye    Past Surgical History  Procedure Laterality Date  . Cholecystectomy    . Abdominal hysterectomy    . Colonoscopy  2005    Negative screening study  . Colonoscopy N/A 04/04/2014    Procedure: COLONOSCOPY;  Surgeon: Rogene Houston, MD;  [facility];  Service: Endoscopy;  Laterality: N/A;  1225  . Exploratory laparotomy      x 3  . Cardiac catheterization      early 2000's  . Appendectomy    . Cataract surgery Left   . 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  Visit Diagnosis:  Right hip pain  Muscle weakness  Unsteadiness  Difficulty walking  Status post right hip replacement      Subjective Assessment - 02/23/15 1300    Subjective Pt states that her hip is feeling good.     Currently in Pain? No/denies                         [facility] Adult PT Treatment/Exercise - 02/23/15 0001    Knee/Hip Exercises: Standing   Lateral Step Up Right;15 reps;Hand Hold: 0;Step Height: 6"   Forward Step Up Right;15 reps;Hand Hold: 0;Step Height: 6"   Knee/Hip Exercises: Prone   Hip Extension Both;15 reps   Hip Extension Limitations quadriped              Balance Exercises - 02/23/15 1339    Balance Exercises: Standing   Standing Eyes Opened Foam/compliant surface;5 reps  SLS   Standing Eyes Closed Narrow base of  support (BOS);Foam/compliant surface;3 reps   Tandem Stance Eyes closed;Foam/compliant surface   SLS with Vectors 2 reps;10 secs   Balance Beam Tandem, retro and sidestepping  with hurdles for sidestepping and tandem    Heel Raises Limitations with yellow ball B UE flexion combined with squats x 10   Sit to Stand Time x20             PT Short Term Goals - 02/20/15 1424    PT SHORT TERM GOAL #1   Title Patient will experience no more than 1/10 pain in her R hip when performing functional standing and ambulation based tasks for at least 60 minutes in order to increase independence and overall mobilty    Period Weeks   Status Achieved   PT SHORT TERM GOAL #2   Title Patient to be able to ambualate safely with single point cane with good sequencing, minimal unsteadiness, and good posture in order to enhance independence and mobility    Time 3   Period Weeks   Status Achieved   PT SHORT TERM GOAL #3   Title Patient to be able to peform five times sit to stand in less than 12 seconds in order to  demonstrate improved muscle power and reduced overall fall risk    Time 3   Period Weeks   Status Achieved   PT SHORT TERM GOAL #4   Title Patient to be independent in correctly and consistently performing appropriate HEP, to be updated PRN    Time 3   Period Weeks   Status Achieved           PT Long Term Goals - 02/20/15 1424    PT LONG TERM GOAL #1   Title Patient to demonstrate at least 4+/5 strength in all tested muscle groups in order to reduce pain, improve mobility, and enhance overall functional stability to reduce fall risk    Time 6   Period Weeks   Status On-going   PT LONG TERM GOAL #2   Title Patient will be able to ambulate at least 1332ft on 6MWT with no assistive device, minimal unsteadiness, and no fatigue in order to demonstrate ability to safely navigate through community without assistive device and with overall reduced fall risk    Time 6   Period Weeks   Status Achieved   PT LONG TERM GOAL #3   Title Patient will be able to perform TUG in 9 seconds or less with no assistive device in order to demonstrate improved overall mobiltiy and greatly reduced fall risk    Time 6   Period Weeks   Status Achieved   PT LONG TERM GOAL #4   Title Patient to report that she has been able to perform at least 30 minutes of moderate level physical activity at least 3 times per week in order to maintain functional gains and facilitate improved overall health habits    Baseline 1   Time 6   Period Weeks   Status Achieved   PT LONG TERM GOAL #5   Title Patient will be able to perform sit to stand/stand to sit from 10-12 inch stool with independence and good sequencing and mechanics so that she is able to more easily reach items on the floor in order to more easily perform cleaning tasks and general home maintenance    Time 6   Period Weeks   Status On-going               Plan -  02/23/15 1344    Clinical Impression Statement Added vector stances with difficulty.   Pt needs verbal cuing to complete exercises correctly.  Pt tends to go down steps sideways without significant encouragement.    PT Next Visit Plan Pt may not be able to make last appointment.  Check with pt if not have pt fill out foto.  May use therapist reassessment on 02/20/15 for discharge assessment.         Problem List Patient Active Problem List   Diagnosis Date Noted  . Primary osteoarthritis of right hip 12/16/2014  . Postoperative anemia due to acute blood loss 12/16/2014  . Bilateral carotid artery disease ([place]) 11/05/2014  . Multiple fractures of ribs of right side 06/18/2012  . Pneumothorax on right 06/18/2012  . Unstable angina ([place]) 01/31/2012  . Cerebrovascular disease 01/31/2012  . Arteriosclerotic cardiovascular disease (ASCVD)   . Hyperlipidemia   . Hypertension   . Anemia, normocytic normochromic 01/15/2012  . MITRAL VALVE PROLAPSE 12/24/2008    Rayetta Humphrey, PT CLT [phone] 02/23/2015, 1:47 PM  [facility] [address] [place], [name], [address] Phone: [phone]   Fax:  [phone]  Name: Mackenzie [name] MRN: [id number] Date of Birth: Oct 07, 1940

## 2015-02-25 ENCOUNTER — Ambulatory Visit (HOSPITAL_COMMUNITY): Payer: Medicare Other

## 2015-02-25 DIAGNOSIS — R2681 Unsteadiness on feet: Secondary | ICD-10-CM

## 2015-02-25 DIAGNOSIS — R262 Difficulty in walking, not elsewhere classified: Secondary | ICD-10-CM

## 2015-02-25 DIAGNOSIS — Z96641 Presence of right artificial hip joint: Secondary | ICD-10-CM

## 2015-02-25 DIAGNOSIS — M6281 Muscle weakness (generalized): Secondary | ICD-10-CM

## 2015-02-25 DIAGNOSIS — M25551 Pain in right hip: Secondary | ICD-10-CM

## 2015-02-25 DIAGNOSIS — Z9660 Presence of unspecified orthopedic joint implant: Secondary | ICD-10-CM | POA: Diagnosis not present

## 2015-02-25 NOTE — Therapy (Signed)
[place], [name], [address] Phone: [phone]   Fax:  [phone]  Physical Therapy Treatment (Discharge)  Patient Details  Name: Mackenzie [name] MRN: [id number] Date of Birth: 09-Oct-1940 Referring Provider: Elsie Saas, MD  Encounter Date: 02/25/2015      PT End of Session - 02/25/15 1307    Visit Number 19   Number of Visits 20   Date for PT Re-Evaluation 03/03/15   Authorization Type Medicare, [place], [employer]    Authorization Time Period 01/01/15 to 03/03/15 (G-code done 10th visit)   Authorization - Visit Number 19   Authorization - Number of Visits 20   PT Start Time 1300   PT Stop Time 1344   PT Time Calculation (min) 44 min   Equipment Utilized During Treatment Gait belt   Activity Tolerance Patient tolerated treatment well   Behavior During Therapy [facility] for tasks assessed/performed      Past Medical History  Diagnosis Date  . Hyperlipidemia     takes Pravastatin daily  . CHF (congestive heart failure) ([place])   . Anxiety     takes Xanax daily as needed  . Hypertension     takes Metoprolol and Azor daily  . Depression     takes Citalopram daily  . Rheumatic fever     hx of  . Wheezing on expiration   . Pneumonia     hx of-2015  . History of bronchitis     2014  . History of migraine   . Dizziness     cardiologist is aware and told pt it was related to meds  . Arthritis   . Joint pain   . History of colon polyps     benign  . Anemia, normocytic normochromic 01/2012    hx of  . Cataract     right eye    Past Surgical History  Procedure Laterality Date  . Cholecystectomy    . Abdominal hysterectomy    . Colonoscopy  2005    Negative screening study  . Colonoscopy N/A 04/04/2014    Procedure: COLONOSCOPY;  Surgeon: Rogene Houston, MD;  [facility];  Service: Endoscopy;  Laterality: N/A;  1225  . Exploratory laparotomy      x 3  . Cardiac catheterization      early 2000's   . Appendectomy    . Cataract surgery Left   . 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  Visit Diagnosis:  Right hip pain  Muscle weakness  Unsteadiness  Difficulty walking  Status post right hip replacement      Subjective Assessment - 02/25/15 1303    Subjective Pt stated she is pain free today, did some vacuuming yesterday and has edema Rt hip today.   Pertinent History Patient had her R hip replaced by anterior approach on November 1st; patient received HHPT after surgery and has been discharged from their services. Arrived using rolling walker today.    Patient Stated Goals get back to PLOF    Currently in Pain? No/denies       Caren Griffins Russell's reassessment complete on 02/20/2015:      [facility] PT Assessment - 02/25/15 1321    Assessment   Medical Diagnosis R anterior hip replacement  Referring Provider Elsie Saas, MD   Onset Date/Surgical Date 12/16/14   Next MD Visit 03/25/15   Precautions   Precautions Anterior Hip   Balance Screen   Has the patient fallen in the past 6 months No   Has the patient had a decrease in activity level because of a fear of falling?  No   Is the patient reluctant to leave their home because of a fear of falling?  No   Prior Function   Level of Independence Independent   Observation/Other Assessments   Focus on Therapeutic Outcomes (FOTO)  28% Limited  was 56% limited   Single Leg Stance   Comments Lt 14 seconds; Rt 25 seconds    Strength   Right Hip Flexion 3+/5   Right Hip Extension 4-/5   Right Hip ABduction 4/5  was 3/5    Left Hip Flexion 5/5  was 4-/5    Left Hip Extension 3+/5   Left Hip ABduction 4+/5   Right Knee Flexion 5/5  was 4-/5   Right Knee Extension 4/5  was 4-/5    Left Knee Flexion 5/5  was 4/5   Left Knee  Extension 5/5  was 4+/5    Right Ankle Dorsiflexion 5/5  was 4+/5   Left Ankle Dorsiflexion 5/5   Transfers   Five time sit to stand comments  10 seconds was 14 on 12/12/           [facility] Adult PT Treatment/Exercise - 02/25/15 1321    Knee/Hip Exercises: Standing   Lateral Step Up Right;15 reps;Hand Hold: 0;Step Height: 6"   Forward Step Up Right;15 reps;Hand Hold: 0;Step Height: 6"   Functional Squat 15 reps   Functional Squat Limitations yellow ball proper lifting from 14in step then heel raise   Stairs 4RT ascend and descend reciprocal pattern 7in step   SLS Rt 27", Lt 21" max of 3   SLS with Vectors 5X3" bilaterally   Other Standing Knee Exercises 6 and 12in hurdles on balance beam forward and sidestep 2RT   Knee/Hip Exercises: Seated   Sit to Sand 10 reps;without UE support  12in step height   Knee/Hip Exercises: Prone   Hip Extension Both;15 reps   Hip Extension Limitations quadriped              Balance Exercises - 02/25/15 1341    Balance Exercises: Standing   Heel Raises Limitations with yellow ball B UE flexion combined with squats x 10             PT Short Term Goals - 02/20/15 1424    PT SHORT TERM GOAL #1   Title Patient will experience no more than 1/10 pain in her R hip when performing functional standing and ambulation based tasks for at least 60 minutes in order to increase independence and overall mobilty    Period Weeks   Status Achieved   PT SHORT TERM GOAL #2   Title Patient to be able to ambualate safely with single point cane with good sequencing, minimal unsteadiness, and good posture in order to enhance independence and mobility    Time 3   Period Weeks   Status Achieved   PT SHORT TERM GOAL #3   Title Patient to be able to peform five times sit to stand in less than 12 seconds in order to demonstrate improved muscle power and reduced overall fall risk    Time 3   Period Weeks   Status Achieved  PT SHORT TERM GOAL #4   Title  Patient to be independent in correctly and consistently performing appropriate HEP, to be updated PRN    Time 3   Period Weeks   Status Achieved           PT Long Term Goals - 02/20/15 1424    PT LONG TERM GOAL #1   Title Patient to demonstrate at least 4+/5 strength in all tested muscle groups in order to reduce pain, improve mobility, and enhance overall functional stability to reduce fall risk    Time 6   Period Weeks   Status On-going   PT LONG TERM GOAL #2   Title Patient will be able to ambulate at least 1356f on 6MWT with no assistive device, minimal unsteadiness, and no fatigue in order to demonstrate ability to safely navigate through community without assistive device and with overall reduced fall risk    Time 6   Period Weeks   Status Achieved   PT LONG TERM GOAL #3   Title Patient will be able to perform TUG in 9 seconds or less with no assistive device in order to demonstrate improved overall mobiltiy and greatly reduced fall risk    Time 6   Period Weeks   Status Achieved   PT LONG TERM GOAL #4   Title Patient to report that she has been able to perform at least 30 minutes of moderate level physical activity at least 3 times per week in order to maintain functional gains and facilitate improved overall health habits    Baseline 1   Time 6   Period Weeks   Status Achieved   PT LONG TERM GOAL #5   Title Patient will be able to perform sit to stand/stand to sit from 10-12 inch stool with independence and good sequencing and mechanics so that she is able to more easily reach items on the floor in order to more easily perform cleaning tasks and general home maintenance    Time 6   Period Weeks   Status On-going               Plan - 02/25/15 1402    Clinical Impression Statement Session focus on progressing functional strengthening and balance training.  Min guard required with all balance activities and therapist facilitation for form with vector stance.   Functional strengthening is improving with ability complete proper lifting with good form, able to stand from 12in step height and complete reciprocal pattern ascending and descending stairs with 1 HR,.  Discussion held with pt.'s plan for vacation and will not make it to next apt.  FOTO complete with vast improvements with self perceived functional abilities.  Reviewed HEP exercises and pt given [employer] membership paper to encourage continued exercises at discharge per PT reassessment complete on 02/20/2015.  No reports of pain through session.     PT Next Visit Plan D/C to HEP per PT POC per reassessment on 02/20/2015.         Problem List Patient Active Problem List   Diagnosis Date Noted  . Primary osteoarthritis of right hip 12/16/2014  . Postoperative anemia due to acute blood loss 12/16/2014  . Bilateral carotid artery disease ([place] 11/05/2014  . Multiple fractures of ribs of right side 06/18/2012  . Pneumothorax on right 06/18/2012  . [place]stable [place] ([place] 01/31/2012  . Cerebrovascular disease 01/31/2012  . Arteriosclerotic cardiovascular disease (ASCVD)   . Hyperlipidemia   . Hypertension   . Anemia, normocytic normochromic  01/15/2012  . MITRAL VALVE PROLAPSE 12/24/2008   Ihor Austin, LPTA; CBIS [phone]      G-Codes - 02/26/2015 1627    Functional Assessment Tool Used FOTO 28% limited    Functional Limitation Mobility: Walking and moving around   Mobility: Walking and Moving Around Goal Status [phone]) At least 20 percent but less than 40 percent impaired, limited or restricted   Mobility: Walking and Moving Around Discharge Status [phone]) At least 20 percent but less than 40 percent impaired, limited or restricted     PHYSICAL THERAPY DISCHARGE SUMMARY  Visits from Start of Care: 19  Current functional level related to goals / functional outcomes: Patient doing very well overall, given advanced HEP and [employer] referral at end of session    Remaining  deficits: Functional strength, balance    Education / Equipment: Advanced HEP, [employer] referral  Plan: Patient agrees to discharge.  Patient goals were partially met. Patient is being discharged due to being pleased with the current functional level.  ?????       Deniece Ree PT, DPT [facility] [address] [place], [name], [address] Phone: [phone]   Fax:  [phone]  Name: VIVIAN NEUWIRTH MRN: [id number] Date of Birth: [birth year]

## 2015-02-26 ENCOUNTER — Ambulatory Visit (HOSPITAL_COMMUNITY): Payer: Medicare Other

## 2015-02-27 ENCOUNTER — Encounter (HOSPITAL_COMMUNITY): Payer: Medicare Other | Admitting: Physical Therapy

## 2015-03-04 ENCOUNTER — Encounter (HOSPITAL_COMMUNITY)
Admission: EM | Disposition: A | Discharge status: Home / Self Care | Payer: Medicare Other | Source: Home / Self Care | Attending: Emergency Medicine

## 2015-03-04 ENCOUNTER — Emergency Department (HOSPITAL_COMMUNITY): Payer: Medicare Other

## 2015-03-04 ENCOUNTER — Observation Stay (HOSPITAL_COMMUNITY)
Admission: EM | Admit: 2015-03-04 | Discharge: 2015-03-05 | Disposition: A | Discharge status: Home / Self Care | Payer: Medicare Other | Attending: Family Medicine | Admitting: Family Medicine

## 2015-03-04 ENCOUNTER — Encounter (HOSPITAL_COMMUNITY): Payer: Self-pay | Admitting: Emergency Medicine

## 2015-03-04 DIAGNOSIS — R0789 Other chest pain: Principal | ICD-10-CM

## 2015-03-04 DIAGNOSIS — I10 Essential (primary) hypertension: Secondary | ICD-10-CM | POA: Insufficient documentation

## 2015-03-04 DIAGNOSIS — F419 Anxiety disorder, unspecified: Secondary | ICD-10-CM | POA: Insufficient documentation

## 2015-03-04 DIAGNOSIS — Z90710 Acquired absence of both cervix and uterus: Secondary | ICD-10-CM | POA: Diagnosis not present

## 2015-03-04 DIAGNOSIS — K219 Gastro-esophageal reflux disease without esophagitis: Secondary | ICD-10-CM | POA: Insufficient documentation

## 2015-03-04 DIAGNOSIS — Z79899 Other long term (current) drug therapy: Secondary | ICD-10-CM | POA: Diagnosis not present

## 2015-03-04 DIAGNOSIS — Z87891 Personal history of nicotine dependence: Secondary | ICD-10-CM | POA: Insufficient documentation

## 2015-03-04 DIAGNOSIS — I25110 Atherosclerotic heart disease of native coronary artery with unstable angina pectoris: Secondary | ICD-10-CM | POA: Insufficient documentation

## 2015-03-04 DIAGNOSIS — I2584 Coronary atherosclerosis due to calcified coronary lesion: Secondary | ICD-10-CM | POA: Insufficient documentation

## 2015-03-04 DIAGNOSIS — R0609 Other forms of dyspnea: Secondary | ICD-10-CM | POA: Diagnosis not present

## 2015-03-04 DIAGNOSIS — Z8249 Family history of ischemic heart disease and other diseases of the circulatory system: Secondary | ICD-10-CM | POA: Insufficient documentation

## 2015-03-04 DIAGNOSIS — E785 Hyperlipidemia, unspecified: Secondary | ICD-10-CM | POA: Diagnosis not present

## 2015-03-04 DIAGNOSIS — I7789 Other specified disorders of arteries and arterioles: Secondary | ICD-10-CM | POA: Diagnosis not present

## 2015-03-04 DIAGNOSIS — Z9049 Acquired absence of other specified parts of digestive tract: Secondary | ICD-10-CM | POA: Diagnosis not present

## 2015-03-04 DIAGNOSIS — D638 Anemia in other chronic diseases classified elsewhere: Secondary | ICD-10-CM | POA: Insufficient documentation

## 2015-03-04 DIAGNOSIS — F329 Major depressive disorder, single episode, unspecified: Secondary | ICD-10-CM | POA: Insufficient documentation

## 2015-03-04 DIAGNOSIS — R079 Chest pain, unspecified: Secondary | ICD-10-CM

## 2015-03-04 DIAGNOSIS — M1990 Unspecified osteoarthritis, unspecified site: Secondary | ICD-10-CM | POA: Diagnosis not present

## 2015-03-04 DIAGNOSIS — I200 Unstable angina: Secondary | ICD-10-CM | POA: Diagnosis not present

## 2015-03-04 DIAGNOSIS — I739 Peripheral vascular disease, unspecified: Secondary | ICD-10-CM

## 2015-03-04 DIAGNOSIS — Z7982 Long term (current) use of aspirin: Secondary | ICD-10-CM | POA: Diagnosis not present

## 2015-03-04 DIAGNOSIS — Z96641 Presence of right artificial hip joint: Secondary | ICD-10-CM | POA: Diagnosis not present

## 2015-03-04 DIAGNOSIS — I779 Disorder of arteries and arterioles, unspecified: Secondary | ICD-10-CM | POA: Diagnosis not present

## 2015-03-04 HISTORY — PX: CARDIAC CATHETERIZATION: SHX172

## 2015-03-04 LAB — BASIC METABOLIC PANEL
Anion gap: 7 (ref 5–15)
BUN: 31 mg/dL — ABNORMAL HIGH (ref 6–20)
CALCIUM: 9.4 mg/dL (ref 8.9–10.3)
CO2: 29 mmol/L (ref 22–32)
CREATININE: 0.86 mg/dL (ref 0.44–1.00)
Chloride: 105 mmol/L (ref 101–111)
GFR calc non Af Amer: >60 mL/min (ref >60)
Glucose, Bld: 109 mg/dL — ABNORMAL HIGH (ref 65–99)
Potassium: 4.2 mmol/L (ref 3.5–5.1)
SODIUM: 141 mmol/L (ref 135–145)

## 2015-03-04 LAB — HEPATIC FUNCTION PANEL
ALK PHOS: 104 U/L (ref 38–126)
ALT: 12 U/L — ABNORMAL LOW (ref 14–54)
AST: 18 U/L (ref 15–41)
Albumin: 3.7 g/dL (ref 3.5–5.0)
Bilirubin, Direct: <0.1 mg/dL — ABNORMAL LOW (ref 0.1–0.5)
TOTAL PROTEIN: 7.1 g/dL (ref 6.5–8.1)
Total Bilirubin: 0.4 mg/dL (ref 0.3–1.2)

## 2015-03-04 LAB — CBC
HCT: 32.8 % — ABNORMAL LOW (ref 36.0–46.0)
Hemoglobin: 10.3 g/dL — ABNORMAL LOW (ref 12.0–15.0)
MCH: 28.9 pg (ref 26.0–34.0)
MCHC: 31.4 g/dL (ref 30.0–36.0)
MCV: 91.9 fL (ref 78.0–100.0)
Platelets: 315 10*3/uL (ref 150–400)
RBC: 3.57 MIL/uL — ABNORMAL LOW (ref 3.87–5.11)
RDW: 13.8 % (ref 11.5–15.5)
WBC: 6.9 10*3/uL (ref 4.0–10.5)

## 2015-03-04 LAB — TROPONIN I
Troponin I: <0.03 ng/mL (ref <0.031)
Troponin I: <0.03 ng/mL (ref <0.031)

## 2015-03-04 LAB — D-DIMER, QUANTITATIVE: D-Dimer, Quant: 1.68 ug/mL-FEU — ABNORMAL HIGH (ref 0.00–0.50)

## 2015-03-04 LAB — LIPASE, BLOOD: LIPASE: 35 U/L (ref 11–51)

## 2015-03-04 LAB — BRAIN NATRIURETIC PEPTIDE: B Natriuretic Peptide: 108 pg/mL — ABNORMAL HIGH (ref 0.0–100.0)

## 2015-03-04 SURGERY — LEFT HEART CATH AND CORONARY ANGIOGRAPHY
Anesthesia: LOCAL

## 2015-03-04 MED ORDER — FENTANYL CITRATE (PF) 100 MCG/2ML IJ SOLN
INTRAMUSCULAR | Status: DC | PRN
  Administered 2015-03-04: 25 ug via INTRAVENOUS

## 2015-03-04 MED ORDER — HEPARIN SODIUM (PORCINE) 1000 UNIT/ML IJ SOLN
INTRAMUSCULAR | Status: AC
  Filled 2015-03-04: qty 1

## 2015-03-04 MED ORDER — SODIUM CHLORIDE 0.9 % WEIGHT BASED INFUSION: 3.0000 mL/kg/h | INTRAVENOUS | Status: DC

## 2015-03-04 MED ORDER — PRAVASTATIN SODIUM 40 MG PO TABS: 40.0000 mg | ORAL_TABLET | ORAL | Status: DC

## 2015-03-04 MED ORDER — FENTANYL CITRATE (PF) 100 MCG/2ML IJ SOLN
INTRAMUSCULAR | Status: AC
  Filled 2015-03-04: qty 2

## 2015-03-04 MED ORDER — MORPHINE SULFATE (PF) 2 MG/ML IV SOLN: 2.0000 mg | INTRAVENOUS | Status: DC | PRN

## 2015-03-04 MED ORDER — METOPROLOL SUCCINATE ER 25 MG PO TB24
25.0000 mg | ORAL_TABLET | Freq: Every day | ORAL | Status: DC
  Administered 2015-03-05: 25 mg via ORAL
  Filled 2015-03-04 (×3): qty 1

## 2015-03-04 MED ORDER — ALPRAZOLAM 0.5 MG PO TABS
0.5000 mg | ORAL_TABLET | Freq: Two times a day (BID) | ORAL | Status: DC | PRN
  Administered 2015-03-04: 0.5 mg via ORAL
  Filled 2015-03-04: qty 1

## 2015-03-04 MED ORDER — SODIUM CHLORIDE 0.9 % IV SOLN: INTRAVENOUS | Status: AC

## 2015-03-04 MED ORDER — HYDROXYZINE HCL 25 MG PO TABS
25.0000 mg | ORAL_TABLET | Freq: Three times a day (TID) | ORAL | Status: DC | PRN
  Administered 2015-03-04 – 2015-03-05 (×2): 25 mg via ORAL
  Filled 2015-03-04 (×2): qty 1

## 2015-03-04 MED ORDER — SODIUM CHLORIDE 0.9 % IV SOLN
INTRAVENOUS | Status: DC
  Administered 2015-03-04: 06:00:00 via INTRAVENOUS

## 2015-03-04 MED ORDER — ASPIRIN 81 MG PO CHEW
324.0000 mg | CHEWABLE_TABLET | Freq: Once | ORAL | Status: AC
  Administered 2015-03-04: 324 mg via ORAL
  Filled 2015-03-04: qty 4

## 2015-03-04 MED ORDER — SODIUM CHLORIDE 0.9 % IV SOLN: 250.0000 mL | INTRAVENOUS | Status: DC | PRN

## 2015-03-04 MED ORDER — HEPARIN SODIUM (PORCINE) 1000 UNIT/ML IJ SOLN
INTRAMUSCULAR | Status: DC | PRN
  Administered 2015-03-04: 3000 [IU] via INTRAVENOUS

## 2015-03-04 MED ORDER — VERAPAMIL HCL 2.5 MG/ML IV SOLN
INTRAVENOUS | Status: DC | PRN
  Administered 2015-03-04: 10 mL via INTRA_ARTERIAL

## 2015-03-04 MED ORDER — SODIUM CHLORIDE 0.9 % WEIGHT BASED INFUSION: 1.0000 mL/kg/h | INTRAVENOUS | Status: DC

## 2015-03-04 MED ORDER — ACETAMINOPHEN 500 MG PO TABS
ORAL_TABLET | ORAL | Status: AC
  Filled 2015-03-04: qty 2

## 2015-03-04 MED ORDER — VERAPAMIL HCL 2.5 MG/ML IV SOLN
INTRAVENOUS | Status: AC
  Filled 2015-03-04: qty 2

## 2015-03-04 MED ORDER — MIDAZOLAM HCL 2 MG/2ML IJ SOLN
INTRAMUSCULAR | Status: AC
Start: 2015-03-04 — End: 2015-03-04
  Filled 2015-03-04: qty 2

## 2015-03-04 MED ORDER — ONDANSETRON HCL 4 MG/2ML IJ SOLN: 4.0000 mg | Freq: Four times a day (QID) | INTRAMUSCULAR | Status: DC | PRN

## 2015-03-04 MED ORDER — SODIUM CHLORIDE 0.9 % IJ SOLN: 3.0000 mL | INTRAMUSCULAR | Status: DC | PRN

## 2015-03-04 MED ORDER — DIPHENHYDRAMINE HCL 25 MG PO CAPS
50.0000 mg | ORAL_CAPSULE | Freq: Four times a day (QID) | ORAL | Status: DC | PRN
  Administered 2015-03-04: 50 mg via ORAL
  Filled 2015-03-04: qty 2

## 2015-03-04 MED ORDER — ACETAMINOPHEN 500 MG PO TABS
1000.0000 mg | ORAL_TABLET | Freq: Once | ORAL | Status: AC
  Administered 2015-03-04: 1000 mg via ORAL

## 2015-03-04 MED ORDER — SODIUM CHLORIDE 0.9 % IJ SOLN: 3.0000 mL | Freq: Two times a day (BID) | INTRAMUSCULAR | Status: DC

## 2015-03-04 MED ORDER — IRBESARTAN 300 MG PO TABS
300.0000 mg | ORAL_TABLET | Freq: Every day | ORAL | Status: DC
  Administered 2015-03-04: 300 mg via ORAL
  Filled 2015-03-04 (×2): qty 1

## 2015-03-04 MED ORDER — ASPIRIN EC 81 MG PO TBEC
81.0000 mg | DELAYED_RELEASE_TABLET | Freq: Every day | ORAL | Status: DC
  Administered 2015-03-05: 81 mg via ORAL
  Filled 2015-03-04 (×2): qty 1

## 2015-03-04 MED ORDER — LIDOCAINE HCL (PF) 1 % IJ SOLN
INTRAMUSCULAR | Status: DC | PRN
  Administered 2015-03-04: 2 mL via INTRADERMAL

## 2015-03-04 MED ORDER — GI COCKTAIL ~~LOC~~: 30.0000 mL | Freq: Four times a day (QID) | ORAL | Status: DC | PRN

## 2015-03-04 MED ORDER — HEPARIN (PORCINE) IN NACL 2-0.9 UNIT/ML-% IJ SOLN
INTRAMUSCULAR | Status: AC
  Filled 2015-03-04: qty 1500

## 2015-03-04 MED ORDER — ASPIRIN 81 MG PO CHEW: 81.0000 mg | CHEWABLE_TABLET | ORAL | Status: DC

## 2015-03-04 MED ORDER — LIDOCAINE HCL (PF) 1 % IJ SOLN
INTRAMUSCULAR | Status: AC
  Filled 2015-03-04: qty 30

## 2015-03-04 MED ORDER — CITALOPRAM HYDROBROMIDE 20 MG PO TABS
10.0000 mg | ORAL_TABLET | Freq: Every day | ORAL | Status: DC
  Administered 2015-03-04 – 2015-03-05 (×2): 10 mg via ORAL
  Filled 2015-03-04 (×3): qty 1

## 2015-03-04 MED ORDER — NITROGLYCERIN 0.4 MG SL SUBL
0.4000 mg | SUBLINGUAL_TABLET | SUBLINGUAL | Status: DC | PRN
  Administered 2015-03-04: 0.4 mg via SUBLINGUAL
  Filled 2015-03-04 (×2): qty 1

## 2015-03-04 MED ORDER — IOHEXOL 350 MG/ML SOLN
100.0000 mL | Freq: Once | INTRAVENOUS | Status: AC | PRN
  Administered 2015-03-04: 100 mL via INTRAVENOUS

## 2015-03-04 MED ORDER — MIDAZOLAM HCL 2 MG/2ML IJ SOLN
INTRAMUSCULAR | Status: DC | PRN
  Administered 2015-03-04: 2 mg via INTRAVENOUS

## 2015-03-04 MED ORDER — AMLODIPINE-OLMESARTAN 5-40 MG PO TABS: 1.0000 | ORAL_TABLET | Freq: Every day | ORAL | Status: DC

## 2015-03-04 MED ORDER — AMLODIPINE BESYLATE 5 MG PO TABS
5.0000 mg | ORAL_TABLET | Freq: Every day | ORAL | Status: DC
Start: 2015-03-04 — End: 2015-03-05
  Administered 2015-03-04: 5 mg via ORAL
  Filled 2015-03-04 (×2): qty 1

## 2015-03-04 MED ORDER — ACETAMINOPHEN 325 MG PO TABS: 650.0000 mg | ORAL_TABLET | ORAL | Status: DC | PRN

## 2015-03-04 SURGICAL SUPPLY — 11 items

## 2015-03-04 NOTE — H&P (View-Only) (Signed)
CARDIOLOGY CONSULT NOTE   Patient ID: Mackenzie [name] MRN: [id number] DOB/AGE: 23-Jan-1941 75 y.o.  Admit date: 03/04/2015  Primary Physician   Asencion Noble, MD Primary Cardiologist   Dr. Meda Coffee  Reason for Consultation   Chest pain Requesting Physician Dr. Sarajane Jews  HPI: Mackenzie [name] is a 75 y.o. 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   Presented to [facility] ED this morning (03/04/2015) with right sided chest pressure with associated SOB and dizziness. States that the chest pain started last night at 9pm but has been becoming more short of breath with exertion over the past few weeks. While in the ED, vital signs were stable, intial EKG showed NSR with no acute ST or T wave changes, initial troponin was negative (<0.03), was given nitroglycerin and ASA in ED with relief of chest pain.  BNP at 108, D-dimer elevated at 1.68, CTA negative for pulmonary embolism and CXR was unremarkable. Requested to be transferred to [facility].   Cardiology was consulted for cardiac work up. Last cardiac cath was in 2000 which she reports is normal but there is no evidence of this in medical records. Last stress test echo in 2013 was normal with EF 55-60%.  Currently, patient denies chest pain, nausea, diaphoresis, and headache. States that she felt better after receiving the nitroglycerin. Does report feeling as if she cannot get enough breath. When asked about her SOB, patient claims that for the past year she has been experiencing dyspnea on exertion. She states that when she is vaccumming the house she sometimes has to stop to catch her breath and will often times feel a "tinge" in her chest, but states that the pain is not  enough to stop her activity. She also notes orthopnea, palpitations, and swelling in her legs over the past year but relates it to not being as active as she once was. Of note, patient reports that her mother died at 73 from a heart attack and had heart issues in her 29s. Pt reports that her last cath in 2000 did show some blockage but nothing worrisome for stent placement. Patient currently feels well and just notes that she is extremely hungry at this pont because she has not eaten all day.   Echo Exercise Stress Test Conclusions 01/2012:  - Stress ECG conclusions: There were no stress arrhythmias or conduction abnormalities. Stress ECG: insignificant upsloping ST segment depression;negative for ischemia. - Staged echo: There was no echocardiographic evidence for stress-induced ischemia. Otherwise equivocal with only a modest increase in EF and segmental contractility from rest to post-exercise imaging.  Echo stress test with contrast Conclusions 01/2012:  - Normal LA, LV, RV andaorticsize. Normal RV function. Normalaortic valve; mild MVP. Borderline LVH. - LV global systolic function was normal. The estimated LV ejection fraction was 55%. - Normal wall motion; no LV regional wall motion abnormalities. Immediate post stress: - LV size was unchanged from baseline. - LV global systolic function was normal and appropriately augmented from baseline. The estimated LV ejection fraction was 60%. - Normal wall motion; no LV regional wall motion abnormalities. - No evidence for new LV regional wall motion abnormalities. In the short axis view, LV volume appeared to decrease  post-exercise; and, all myocardial segments appeared to have increased contractility. In other views a decrease in chamber size and increase in contraction was less apparent. This may reflect a more apical location for the post-stress images. No new segmental     Past Medical History    Diagnosis Date  . Hyperlipidemia     takes Pravastatin daily  . CHF (congestive heart failure) ([place])   . Anxiety     takes Xanax daily as needed  . Hypertension     takes Metoprolol and Azor daily  . Depression     takes Citalopram daily  . Rheumatic fever     hx of  . Wheezing on expiration   . Pneumonia     hx of-2015  . History of bronchitis     2014  . History of migraine   . Dizziness     cardiologist is aware and told pt it was related to meds  . Arthritis   . Joint pain   . History of colon polyps     benign  . Anemia, normocytic normochromic 01/2012    hx of  . Cataract     right eye     Past Surgical History  Procedure Laterality Date  . Cholecystectomy    . Abdominal hysterectomy    . Colonoscopy  2005    Negative screening study  . Colonoscopy N/A 04/04/2014    Procedure: COLONOSCOPY;  Surgeon: Rogene Houston, MD;  Location: [facility];  Service: Endoscopy;  Laterality: N/A;  1225  . Exploratory laparotomy      x 3  . Cardiac catheterization      early 2000's  . Appendectomy    . Cataract surgery Left   . Total hip arthroplasty Right 12/16/2014    Procedure: TOTAL HIP ARTHROPLASTY ANTERIOR APPROACH;  Surgeon: Renette Butters, MD;  Location: [place];  Service: Orthopedics;  Laterality: Right;    Allergies  Allergen Reactions  . Statins Other (See Comments)    Atorvastatin, pravastatin, simvastatin->myalgias     I have reviewed the patient's current medications . amLODipine  5 mg Oral QHS  . [START ON 03/05/2015] aspirin EC  81 mg Oral Daily  . citalopram  10 mg Oral Daily  . irbesartan  300 mg Oral QHS  . metoprolol succinate  25 mg Oral Daily  . pravastatin  40 mg Oral Once per day on Mon Wed Fri     acetaminophen, ALPRAZolam, gi cocktail, morphine injection, ondansetron (ZOFRAN) IV  Prior to Admission medications   Medication Sig Start Date End Date Taking? Authorizing Provider  ALPRAZolam Duanne Moron) 0.5 MG tablet Take 0.5 mg by mouth 2  (two) times daily as needed. Anxiety/sleep aid   Yes Historical Provider, MD  amLODipine-olmesartan (AZOR) 5-40 MG per tablet Take 1 tablet by mouth at bedtime.    Yes Historical Provider, MD  aspirin 81 MG tablet Take 81 mg by mouth daily.   Yes Historical Provider, MD  citalopram (CELEXA) 10 MG tablet Take 10 mg by mouth daily.   Yes Historical Provider, MD  clobetasol cream (TEMOVATE) [id number] % Apply 1 application topically daily as needed. 02/17/15  Yes Historical Provider, MD  Cyanocobalamin (VITAMIN B-12 IJ) Inject 1 mL as directed every 30 (thirty) days.   Yes Historical Provider, MD  HYDROcodone-acetaminophen (NORCO) 5-325 MG tablet Take 1-2 tablets by mouth every 6 (six) hours as needed for moderate pain. 12/16/14  Yes Brittney Kelly, PA-C  metoprolol succinate (TOPROL-XL) 25 MG 24  hr tablet Take 25 mg by mouth daily.     Yes Historical Provider, MD  Multiple Vitamin (MULTIVITAMIN WITH MINERALS) TABS Take 1 tablet by mouth daily.   Yes Historical Provider, MD  permethrin (ELIMITE) 5 % cream Apply 1 application topically daily as needed. 02/12/15  Yes Historical Provider, MD  pravastatin (PRAVACHOL) 40 MG tablet Take 40 mg by mouth 3 (three) times a week.    Yes Historical Provider, MD  triamcinolone cream (KENALOG) 0.1 % Apply 1 application topically daily as needed. 02/27/15  Yes Historical Provider, MD  aspirin 325 MG tablet Take 1 tablet (325 mg total) by mouth daily. 12/16/14   Brittney Claiborne Billings, PA-C  docusate sodium (COLACE) 100 MG capsule Take 1 capsule (100 mg total) by mouth 2 (two) times daily. 12/16/14   Brittney Claiborne Billings, PA-C  methocarbamol (ROBAXIN) 500 MG tablet Take 1 tablet (500 mg total) by mouth 4 (four) times daily. 12/16/14   Brittney Claiborne Billings, PA-C  ondansetron (ZOFRAN) 4 MG tablet Take 1 tablet (4 mg total) by mouth every 8 (eight) hours as needed for nausea or vomiting. 12/16/14   Lovett Calender, PA-C     Social History   Social History  . Marital Status: Widowed    Spouse Name:  N/A  . Number of Children: N/A  . Years of Education: N/A   Occupational History  . Not on file.   Social History Main Topics  . Smoking status: Former Smoker -- 1.00 packs/day for 15 years  . Smokeless tobacco: Never Used     Comment: quit smoking 30+yrs ago  . Alcohol Use: Yes     Comment: socially wine  . Drug Use: No  . Sexual Activity: Not on file   Other Topics Concern  . Not on file   Social History Narrative    Family Status  Relation Status Death Age  . Mother Deceased   . Sister Deceased     Pancreatitis  . Other Deceased     Sibling  . Father Deceased     CAD  . Brother Deceased     died at birth  . Maternal Aunt Deceased   . Maternal Aunt Deceased   . Maternal Aunt Deceased    Family History  Problem Relation Age of Onset  . Heart attack Mother 10    heart problems  . Cancer Mother     uterus  . Cancer      FH  . Diabetes      FH  . Hypertension      FH  . Pancreatitis Sister     died  age 42  . Diabetes Son   . Diabetes Daughter   . Colon cancer Maternal Aunt 81  . Colon cancer Maternal Aunt 78  . Colon cancer Maternal Aunt 76  . Colon cancer Maternal Uncle 40      ROS:  Full 14 point review of systems complete and found to be negative unless listed above.  Physical Exam: Blood pressure 126/48, pulse 59, temperature 97.8 F (36.6 C), temperature source Oral, resp. rate 18, height 5' (1.524 m), weight 137 lb 2 oz (62.2 kg), SpO2 95 %.  General: Well developed, well nourished, female in no acute distress Head: Eyes PERRLA, No xanthomas. Normocephalic and atraumatic, oropharynx without edema or exudate.  Lungs: Resp regular and unlabored, CTAB. Heart: RRR no s3, s4, or murmurs..   Neck: No carotid bruits. No lymphadenopathy.  JVD. Abdomen: Bowel sounds present, abdomen soft and non-tender without  masses or hernias noted. Msk:  No spine or cva tenderness. No weakness, no joint deformities or effusions. Extremities: No clubbing, cyanosis  or edema. DP/PT/Radials 2+ and equal bilaterally. Neuro: Alert and oriented X 3. No focal deficits noted. Psych:  Good affect, responds appropriately Skin: No rashes or lesions noted.  Labs:   Lab Results  Component Value Date   WBC 6.9 03/04/2015   HGB 10.3* 03/04/2015   HCT 32.8* 03/04/2015   MCV 91.9 03/04/2015   PLT 315 03/04/2015   No results for input(s): INR in the last 72 hours.  Recent Labs Lab 03/04/15 0420  NA 141  K 4.2  CL 105  CO2 29  BUN 31*  CREATININE 0.86  CALCIUM 9.4  PROT 7.1  BILITOT 0.4  ALKPHOS 104  ALT 12*  AST 18  GLUCOSE 109*  ALBUMIN 3.7   MAGNESIUM  Date Value Ref Range Status  01/31/2012 1.8 1.5 - 2.5 mg/dL Final    Recent Labs  03/04/15 0420  TROPONINI <0.03   No results for input(s): TROPIPOC in the last 72 hours. No results found for: PROBNP Lab Results  Component Value Date   CHOL 270* 01/31/2012   HDL 56 01/31/2012   LDLCALC 184* 01/31/2012   TRIG 152* 01/31/2012   Lab Results  Component Value Date   DDIMER 1.68* 03/04/2015   LIPASE  Date/Time Value Ref Range Status  03/04/2015 04:20 AM 35 11 - 51 U/L Final   TSH  Date/Time Value Ref Range Status  01/31/2012 09:58 AM 2.199 0.350 - 4.500 uIU/mL Final   VITAMIN B-12  Date/Time Value Ref Range Status  01/31/2012 05:50 AM 554 211 - 911 pg/mL Final   FOLATE  Date/Time Value Ref Range Status  01/31/2012 05:50 AM >20.0 ng/mL Final    Comment:    (NOTE) Reference Ranges        Deficient:       0.4 - 3.3 ng/mL        Indeterminate:   3.4 - 5.4 ng/mL        Normal:              > 5.4 ng/mL   FERRITIN  Date/Time Value Ref Range Status  01/31/2012 05:50 AM 120 10 - 291 ng/mL Final   TIBC  Date/Time Value Ref Range Status  01/31/2012 05:50 AM 339 250 - 470 ug/dL Final   IRON  Date/Time Value Ref Range Status  01/31/2012 05:50 AM 53 42 - 135 ug/dL Final   RETIC CT PCT  Date/Time Value Ref Range Status  01/31/2012 05:50 AM 2.5 0.4 - 3.1 % Final    ECG:  NSR with no acute ST or T wave changes.   Radiology:  Dg Chest 2 View  03/04/2015  CLINICAL DATA:  75 year old female with chest pain EXAM: CHEST  2 VIEW COMPARISON:  Radiograph dated 12/04/2014 FINDINGS: Two views of the chest do not demonstrate focal consolidation. Stable appearing 5 mm nodular density in the right mid lung corresponds to the calcified subpleural nodule seen on the CT. There is no pleural effusion or pneumothorax. Stable cardiac silhouette. Old healed right rib fractures. IMPRESSION: No active cardiopulmonary disease. Electronically Signed   By: Anner Crete M.D.   On: 03/04/2015 04:55   Ct Angio Chest Pe W/cm &/or Wo Cm  03/04/2015  CLINICAL DATA:  75 year old female with right-sided chest pressure radiating to the back EXAM: CT ANGIOGRAPHY CHEST WITH CONTRAST TECHNIQUE: Multidetector CT imaging of the chest was performed using  the standard protocol during bolus administration of intravenous contrast. Multiplanar CT image reconstructions and MIPs were obtained to evaluate the vascular anatomy. CONTRAST:  134mL OMNIPAQUE IOHEXOL 350 MG/ML SOLN COMPARISON:  Chest radiograph dated 03/04/2015 FINDINGS: There is mild increased interstitial prominence predominantly at the lung bases. Mild bibasilar airspace ground-glass density also noted likely atelectatic changes. Pneumonia is less likely. There is no focal consolidation, pleural effusion, or pneumothorax. Small calcified subpleural nodule in the right lower lobe stable compared to the prior study. The central airways are patent There is atherosclerotic calcification of the thoracic aorta. No CT evidence of pulmonary embolism. Mild cardiomegaly. No significant pericardial effusion. There is no hilar or mediastinal adenopathy. Right hilar calcified lymph node likely related to old granuloma. The esophagus is grossly unremarkable. There is no axillary adenopathy. The chest wall soft tissues appear unremarkable. There is degenerative changes  of the spine. Old healed right posterior rib fractures. No acute fracture. The visualized upper abdomen is unremarkable. Review of the MIP images confirms the above findings. IMPRESSION: No CT evidence of pulmonary embolism. Electronically Signed   By: Anner Crete M.D.   On: 03/04/2015 06:32    ASSESSMENT AND PLAN:    TONNETTA BUNNEY is a 75 y.o. 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   1) Chest Pain and Shortness of Breath: concerned for cardiac etiology due risk factors of HTN, FH (mother died at 62 from MI), tobacco abuse hx (15 pack year hx), heart cath in 2000 with atherosclerotic changes per patient (no medical record in system),  exertional CP and DOE > 1 year, atypical right sided CP last night relieved with nitroglycerin in ED. In ED,diagnostic exam and labs revealed EKG with NSR with no acute ST or T wave changes, CTA neg for pulmonary embolism, unremarkable CXR, negative troponin <0.03, BNP 108, and D-dimer of 1.68. Recommend cardiac ischemic work up. Trend troponins. Cardiac cath vs stress test? Continue ASA, BB, statin.   2) HTN: BP currently controlled. Continue home medications norvasc 5mg , avapro 300mg , and toprol-XL 25mg .    Active Problems:   Chest pain    Signed: Tenna Delaine, [place] 03/04/2015, 1:01 PM   Co-Sign MD

## 2015-03-04 NOTE — Interval H&P Note (Signed)
Cath Lab Visit (complete for each Cath Lab visit)  Clinical Evaluation Leading to the Procedure:   ACS: Yes.    Non-ACS:    Anginal Classification: CCS IV  Anti-ischemic medical therapy: Maximal Therapy (2 or more classes of medications)  Non-Invasive Test Results: No non-invasive testing performed  Prior CABG: No previous CABG      History and Physical Interval Note:  03/04/2015 4:14 PM  Mackenzie [name]  has presented today for surgery, with the diagnosis of cp  The various methods of treatment have been discussed with the patient and family. After consideration of risks, benefits and other options for treatment, the patient has consented to  Procedure(s): Left Heart Cath and Coronary Angiography (N/A) as a surgical intervention .  The patient's history has been reviewed, patient examined, no change in status, stable for surgery.  I have reviewed the patient's chart and labs.  Questions were answered to the patient's satisfaction.     Sherren Mocha

## 2015-03-04 NOTE — Consult Note (Signed)
CARDIOLOGY CONSULT NOTE   Patient ID: NAYDEAN KINNIE MRN: [id number] DOB/AGE: 1940/11/09 75 y.o.  Admit date: 03/04/2015  Primary Physician   Asencion Noble, MD Primary Cardiologist   Dr. Meda Coffee  Reason for Consultation   Chest pain Requesting Physician Dr. Sarajane Jews  HPI: DESTINA MCCONVILLE is a 75 y.o. 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   Presented to [facility] ED this morning (03/04/2015) with right sided chest pressure with associated SOB and dizziness. States that the chest pain started last night at 9pm but has been becoming more short of breath with exertion over the past few weeks. While in the ED, vital signs were stable, intial EKG showed NSR with no acute ST or T wave changes, initial troponin was negative (<0.03), was given nitroglycerin and ASA in ED with relief of chest pain.  BNP at 108, D-dimer elevated at 1.68, CTA negative for pulmonary embolism and CXR was unremarkable. Requested to be transferred to [facility].   Cardiology was consulted for cardiac work up. Last cardiac cath was in 2000 which she reports is normal but there is no evidence of this in medical records. Last stress test echo in 2013 was normal with EF 55-60%.  Currently, patient denies chest pain, nausea, diaphoresis, and headache. States that she felt better after receiving the nitroglycerin. Does report feeling as if she cannot get enough breath. When asked about her SOB, patient claims that for the past year she has been experiencing dyspnea on exertion. She states that when she is vaccumming the house she sometimes has to stop to catch her breath and will often times feel a "tinge" in her chest, but states that the pain is not  enough to stop her activity. She also notes orthopnea, palpitations, and swelling in her legs over the past year but relates it to not being as active as she once was. Of note, patient reports that her mother died at 74 from a heart attack and had heart issues in her 20s. Pt reports that her last cath in 2000 did show some blockage but nothing worrisome for stent placement. Patient currently feels well and just notes that she is extremely hungry at this pont because she has not eaten all day.   Echo Exercise Stress Test Conclusions 01/2012:  - Stress ECG conclusions: There were no stress arrhythmias or conduction abnormalities. Stress ECG: insignificant upsloping ST segment depression;negative for ischemia. - Staged echo: There was no echocardiographic evidence for stress-induced ischemia. Otherwise equivocal with only a modest increase in EF and segmental contractility from rest to post-exercise imaging.  Echo stress test with contrast Conclusions 01/2012:  - Normal LA, LV, RV andaorticsize. Normal RV function. Normalaortic valve; mild MVP. Borderline LVH. - LV global systolic function was normal. The estimated LV ejection fraction was 55%. - Normal wall motion; no LV regional wall motion abnormalities. Immediate post stress: - LV size was unchanged from baseline. - LV global systolic function was normal and appropriately augmented from baseline. The estimated LV ejection fraction was 60%. - Normal wall motion; no LV regional wall motion abnormalities. - No evidence for new LV regional wall motion abnormalities. In the short axis view, LV volume appeared to decrease  post-exercise; and, all myocardial segments appeared to have increased contractility. In other views a decrease in chamber size and increase in contraction was less apparent. This may reflect a more apical location for the post-stress images. No new segmental     Past Medical History    Diagnosis Date  . Hyperlipidemia     takes Pravastatin daily  . CHF (congestive heart failure) ([place])   . Anxiety     takes Xanax daily as needed  . Hypertension     takes Metoprolol and Azor daily  . Depression     takes Citalopram daily  . Rheumatic fever     hx of  . Wheezing on expiration   . Pneumonia     hx of-2015  . History of bronchitis     2014  . History of migraine   . Dizziness     cardiologist is aware and told pt it was related to meds  . Arthritis   . Joint pain   . History of colon polyps     benign  . Anemia, normocytic normochromic 01/2012    hx of  . Cataract     right eye     Past Surgical History  Procedure Laterality Date  . Cholecystectomy    . Abdominal hysterectomy    . Colonoscopy  2005    Negative screening study  . Colonoscopy N/A 04/04/2014    Procedure: COLONOSCOPY;  Surgeon: Rogene Houston, MD;  Location: [facility];  Service: Endoscopy;  Laterality: N/A;  1225  . Exploratory laparotomy      x 3  . Cardiac catheterization      early 2000's  . Appendectomy    . Cataract surgery Left   . Total hip arthroplasty Right 12/16/2014    Procedure: TOTAL HIP ARTHROPLASTY ANTERIOR APPROACH;  Surgeon: Renette Butters, MD;  Location: [place];  Service: Orthopedics;  Laterality: Right;    Allergies  Allergen Reactions  . Statins Other (See Comments)    Atorvastatin, pravastatin, simvastatin->myalgias     I have reviewed the patient's current medications . amLODipine  5 mg Oral QHS  . [START ON 03/05/2015] aspirin EC  81 mg Oral Daily  . citalopram  10 mg Oral Daily  . irbesartan  300 mg Oral QHS  . metoprolol succinate  25 mg Oral Daily  . pravastatin  40 mg Oral Once per day on Mon Wed Fri     acetaminophen, ALPRAZolam, gi cocktail, morphine injection, ondansetron (ZOFRAN) IV  Prior to Admission medications   Medication Sig Start Date End Date Taking? Authorizing Provider  ALPRAZolam Duanne Moron) 0.5 MG tablet Take 0.5 mg by mouth 2  (two) times daily as needed. Anxiety/sleep aid   Yes Historical Provider, MD  amLODipine-olmesartan (AZOR) 5-40 MG per tablet Take 1 tablet by mouth at bedtime.    Yes Historical Provider, MD  aspirin 81 MG tablet Take 81 mg by mouth daily.   Yes Historical Provider, MD  citalopram (CELEXA) 10 MG tablet Take 10 mg by mouth daily.   Yes Historical Provider, MD  clobetasol cream (TEMOVATE) [id number] % Apply 1 application topically daily as needed. 02/17/15  Yes Historical Provider, MD  Cyanocobalamin (VITAMIN B-12 IJ) Inject 1 mL as directed every 30 (thirty) days.   Yes Historical Provider, MD  HYDROcodone-acetaminophen (NORCO) 5-325 MG tablet Take 1-2 tablets by mouth every 6 (six) hours as needed for moderate pain. 12/16/14  Yes Brittney Kelly, PA-C  metoprolol succinate (TOPROL-XL) 25 MG 24  hr tablet Take 25 mg by mouth daily.     Yes Historical Provider, MD  Multiple Vitamin (MULTIVITAMIN WITH MINERALS) TABS Take 1 tablet by mouth daily.   Yes Historical Provider, MD  permethrin (ELIMITE) 5 % cream Apply 1 application topically daily as needed. 02/12/15  Yes Historical Provider, MD  pravastatin (PRAVACHOL) 40 MG tablet Take 40 mg by mouth 3 (three) times a week.    Yes Historical Provider, MD  triamcinolone cream (KENALOG) 0.1 % Apply 1 application topically daily as needed. 02/27/15  Yes Historical Provider, MD  aspirin 325 MG tablet Take 1 tablet (325 mg total) by mouth daily. 12/16/14   Brittney Claiborne Billings, PA-C  docusate sodium (COLACE) 100 MG capsule Take 1 capsule (100 mg total) by mouth 2 (two) times daily. 12/16/14   Brittney Claiborne Billings, PA-C  methocarbamol (ROBAXIN) 500 MG tablet Take 1 tablet (500 mg total) by mouth 4 (four) times daily. 12/16/14   Brittney Claiborne Billings, PA-C  ondansetron (ZOFRAN) 4 MG tablet Take 1 tablet (4 mg total) by mouth every 8 (eight) hours as needed for nausea or vomiting. 12/16/14   Lovett Calender, PA-C     Social History   Social History  . Marital Status: Widowed    Spouse Name:  N/A  . Number of Children: N/A  . Years of Education: N/A   Occupational History  . Not on file.   Social History Main Topics  . Smoking status: Former Smoker -- 1.00 packs/day for 15 years  . Smokeless tobacco: Never Used     Comment: quit smoking 30+yrs ago  . Alcohol Use: Yes     Comment: socially wine  . Drug Use: No  . Sexual Activity: Not on file   Other Topics Concern  . Not on file   Social History Narrative    Family Status  Relation Status Death Age  . Mother Deceased   . Sister Deceased     Pancreatitis  . Other Deceased     Sibling  . Father Deceased     CAD  . Brother Deceased     died at birth  . Maternal Aunt Deceased   . Maternal Aunt Deceased   . Maternal Aunt Deceased    Family History  Problem Relation Age of Onset  . Heart attack Mother 22    heart problems  . Cancer Mother     uterus  . Cancer      FH  . Diabetes      FH  . Hypertension      FH  . Pancreatitis Sister     died  age 70  . Diabetes Son   . Diabetes Daughter   . Colon cancer Maternal Aunt 81  . Colon cancer Maternal Aunt 78  . Colon cancer Maternal Aunt 76  . Colon cancer Maternal Uncle 40      ROS:  Full 14 point review of systems complete and found to be negative unless listed above.  Physical Exam: Blood pressure 126/48, pulse 59, temperature 97.8 F (36.6 C), temperature source Oral, resp. rate 18, height 5' (1.524 m), weight 137 lb 2 oz (62.2 kg), SpO2 95 %.  General: Well developed, well nourished, female in no acute distress Head: Eyes PERRLA, No xanthomas. Normocephalic and atraumatic, oropharynx without edema or exudate.  Lungs: Resp regular and unlabored, CTAB. Heart: RRR no s3, s4, or murmurs..   Neck: No carotid bruits. No lymphadenopathy.  JVD. Abdomen: Bowel sounds present, abdomen soft and non-tender without  masses or hernias noted. Msk:  No spine or cva tenderness. No weakness, no joint deformities or effusions. Extremities: No clubbing, cyanosis  or edema. DP/PT/Radials 2+ and equal bilaterally. Neuro: Alert and oriented X 3. No focal deficits noted. Psych:  Good affect, responds appropriately Skin: No rashes or lesions noted.  Labs:   Lab Results  Component Value Date   WBC 6.9 03/04/2015   HGB 10.3* 03/04/2015   HCT 32.8* 03/04/2015   MCV 91.9 03/04/2015   PLT 315 03/04/2015   No results for input(s): INR in the last 72 hours.  Recent Labs Lab 03/04/15 0420  NA 141  K 4.2  CL 105  CO2 29  BUN 31*  CREATININE 0.86  CALCIUM 9.4  PROT 7.1  BILITOT 0.4  ALKPHOS 104  ALT 12*  AST 18  GLUCOSE 109*  ALBUMIN 3.7   MAGNESIUM  Date Value Ref Range Status  01/31/2012 1.8 1.5 - 2.5 mg/dL Final    Recent Labs  03/04/15 0420  TROPONINI <0.03   No results for input(s): TROPIPOC in the last 72 hours. No results found for: PROBNP Lab Results  Component Value Date   CHOL 270* 01/31/2012   HDL 56 01/31/2012   LDLCALC 184* 01/31/2012   TRIG 152* 01/31/2012   Lab Results  Component Value Date   DDIMER 1.68* 03/04/2015   LIPASE  Date/Time Value Ref Range Status  03/04/2015 04:20 AM 35 11 - 51 U/L Final   TSH  Date/Time Value Ref Range Status  01/31/2012 09:58 AM 2.199 0.350 - 4.500 uIU/mL Final   VITAMIN B-12  Date/Time Value Ref Range Status  01/31/2012 05:50 AM 554 211 - 911 pg/mL Final   FOLATE  Date/Time Value Ref Range Status  01/31/2012 05:50 AM >20.0 ng/mL Final    Comment:    (NOTE) Reference Ranges        Deficient:       0.4 - 3.3 ng/mL        Indeterminate:   3.4 - 5.4 ng/mL        Normal:              > 5.4 ng/mL   FERRITIN  Date/Time Value Ref Range Status  01/31/2012 05:50 AM 120 10 - 291 ng/mL Final   TIBC  Date/Time Value Ref Range Status  01/31/2012 05:50 AM 339 250 - 470 ug/dL Final   IRON  Date/Time Value Ref Range Status  01/31/2012 05:50 AM 53 42 - 135 ug/dL Final   RETIC CT PCT  Date/Time Value Ref Range Status  01/31/2012 05:50 AM 2.5 0.4 - 3.1 % Final    ECG:  NSR with no acute ST or T wave changes.   Radiology:  Dg Chest 2 View  03/04/2015  CLINICAL DATA:  75 year old female with chest pain EXAM: CHEST  2 VIEW COMPARISON:  Radiograph dated 12/04/2014 FINDINGS: Two views of the chest do not demonstrate focal consolidation. Stable appearing 5 mm nodular density in the right mid lung corresponds to the calcified subpleural nodule seen on the CT. There is no pleural effusion or pneumothorax. Stable cardiac silhouette. Old healed right rib fractures. IMPRESSION: No active cardiopulmonary disease. Electronically Signed   By: Anner Crete M.D.   On: 03/04/2015 04:55   Ct Angio Chest Pe W/cm &/or Wo Cm  03/04/2015  CLINICAL DATA:  75 year old female with right-sided chest pressure radiating to the back EXAM: CT ANGIOGRAPHY CHEST WITH CONTRAST TECHNIQUE: Multidetector CT imaging of the chest was performed using  the standard protocol during bolus administration of intravenous contrast. Multiplanar CT image reconstructions and MIPs were obtained to evaluate the vascular anatomy. CONTRAST:  175mL OMNIPAQUE IOHEXOL 350 MG/ML SOLN COMPARISON:  Chest radiograph dated 03/04/2015 FINDINGS: There is mild increased interstitial prominence predominantly at the lung bases. Mild bibasilar airspace ground-glass density also noted likely atelectatic changes. Pneumonia is less likely. There is no focal consolidation, pleural effusion, or pneumothorax. Small calcified subpleural nodule in the right lower lobe stable compared to the prior study. The central airways are patent There is atherosclerotic calcification of the thoracic aorta. No CT evidence of pulmonary embolism. Mild cardiomegaly. No significant pericardial effusion. There is no hilar or mediastinal adenopathy. Right hilar calcified lymph node likely related to old granuloma. The esophagus is grossly unremarkable. There is no axillary adenopathy. The chest wall soft tissues appear unremarkable. There is degenerative changes  of the spine. Old healed right posterior rib fractures. No acute fracture. The visualized upper abdomen is unremarkable. Review of the MIP images confirms the above findings. IMPRESSION: No CT evidence of pulmonary embolism. Electronically Signed   By: Anner Crete M.D.   On: 03/04/2015 06:32    ASSESSMENT AND PLAN:    OMANI ABDULAZIZ is a 75 y.o. 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   1) Chest Pain and Shortness of Breath: concerned for cardiac etiology due risk factors of HTN, FH (mother died at 3 from MI), tobacco abuse hx (15 pack year hx), heart cath in 2000 with atherosclerotic changes per patient (no medical record in system),  exertional CP and DOE > 1 year, atypical right sided CP last night relieved with nitroglycerin in ED. In ED,diagnostic exam and labs revealed EKG with NSR with no acute ST or T wave changes, CTA neg for pulmonary embolism, unremarkable CXR, negative troponin <0.03, BNP 108, and D-dimer of 1.68. Recommend cardiac ischemic work up. Trend troponins. Cardiac cath vs stress test? Continue ASA, BB, statin.   2) HTN: BP currently controlled. Continue home medications norvasc 5mg , avapro 300mg , and toprol-XL 25mg .    Active Problems:   Chest pain    Signed: Tenna Delaine, [place] 03/04/2015, 1:01 PM   Co-Sign MD

## 2015-03-04 NOTE — ED Notes (Signed)
Patient complaining of right-sided chest pressure since 2100 last night. Reports pain feels as if it is going through to back and radiating to right shoulder blade. Patient also reports feeling nervous and took 0.5 mg alprazolam last night with no relief.

## 2015-03-04 NOTE — ED Notes (Signed)
Hospitalist at bedside 

## 2015-03-04 NOTE — ED Provider Notes (Signed)
TIME SEEN: 4:15 AM  CHIEF COMPLAINT: Chest pain  HPI: Pt is a 75 y.o. female with history of hypertension, hyperlipidemia who presents to the emergency department with complaints of central to right-sided chest pressure that radiates into her back with associated shortness of breath, dizziness. States symptoms started around 9 PM last night. No aggravating or relieving factors. States she did have a similar episode a week ago where she also felt nauseous but no vomiting. No diaphoresis with these episodes. States that over the past several weeks she has been becoming more short of breath with exertion. Her last cardiac catheterization was in 2000 when she reports that it was normal. Last stress test was in 2013 and stress echo in 2014. She recently had her right hip replaced 2 months ago. Was a previous smoker but quit 30 years ago. No fevers or cough. No history of PE or DVT. No lower extremity swelling or pain. PCP is Dr. Willey Blade.  ROS: See HPI Constitutional: no fever  Eyes: no drainage  ENT: no runny nose   Cardiovascular:   chest pain  Resp: SOB  GI: no vomiting GU: no dysuria Integumentary: no rash  Allergy: no hives  Musculoskeletal: no leg swelling  Neurological: no slurred speech ROS otherwise negative  PAST MEDICAL HISTORY/PAST SURGICAL HISTORY:  Past Medical History  Diagnosis Date  . Hyperlipidemia     takes Pravastatin daily  . CHF (congestive heart failure) ([place])   . Anxiety     takes Xanax daily as needed  . Hypertension     takes Metoprolol and Azor daily  . Depression     takes Citalopram daily  . Rheumatic fever     hx of  . Wheezing on expiration   . Pneumonia     hx of-2015  . History of bronchitis     2014  . History of migraine   . Dizziness     cardiologist is aware and told pt it was related to meds  . Arthritis   . Joint pain   . History of colon polyps     benign  . Anemia, normocytic normochromic 01/2012    hx of  . Cataract     right eye     MEDICATIONS:  Prior to Admission medications   Medication Sig Start Date End Date Taking? Authorizing Provider  ALPRAZolam Duanne Moron) 0.5 MG tablet Take 0.5 mg by mouth 2 (two) times daily as needed. Anxiety/sleep aid    Historical Provider, MD  amLODipine-olmesartan (AZOR) 5-40 MG per tablet Take 1 tablet by mouth at bedtime.     Historical Provider, MD  aspirin 325 MG tablet Take 1 tablet (325 mg total) by mouth daily. 12/16/14   Brittney Claiborne Billings, PA-C  citalopram (CELEXA) 10 MG tablet Take 10 mg by mouth daily.    Historical Provider, MD  Cyanocobalamin (VITAMIN B-12 IJ) Inject 1 mL as directed every 30 (thirty) days.    Historical Provider, MD  docusate sodium (COLACE) 100 MG capsule Take 1 capsule (100 mg total) by mouth 2 (two) times daily. 12/16/14   Brittney Claiborne Billings, PA-C  HYDROcodone-acetaminophen (NORCO) 5-325 MG tablet Take 1-2 tablets by mouth every 6 (six) hours as needed for moderate pain. 12/16/14   Brittney Claiborne Billings, PA-C  methocarbamol (ROBAXIN) 500 MG tablet Take 1 tablet (500 mg total) by mouth 4 (four) times daily. 12/16/14   Brittney Claiborne Billings, PA-C  metoprolol succinate (TOPROL-XL) 25 MG 24 hr tablet Take 25 mg by mouth daily.  Historical Provider, MD  Multiple Vitamin (MULTIVITAMIN WITH MINERALS) TABS Take 1 tablet by mouth daily.    Historical Provider, MD  ondansetron (ZOFRAN) 4 MG tablet Take 1 tablet (4 mg total) by mouth every 8 (eight) hours as needed for nausea or vomiting. 12/16/14   Brittney Claiborne Billings, PA-C  pravastatin (PRAVACHOL) 40 MG tablet Take 40 mg by mouth 3 (three) times a week.     Historical Provider, MD    ALLERGIES:  Allergies  Allergen Reactions  . Statins Other (See Comments)    Atorvastatin, pravastatin, simvastatin->myalgias     SOCIAL HISTORY:  Social History  Substance Use Topics  . Smoking status: Former Smoker -- 1.00 packs/day for 15 years  . Smokeless tobacco: Never Used     Comment: quit smoking 30+yrs ago  . Alcohol Use: Yes     Comment:  socially wine    FAMILY HISTORY: Family History  Problem Relation Age of Onset  . Heart attack Mother 6    heart problems  . Cancer Mother     uterus  . Cancer      FH  . Diabetes      FH  . Hypertension      FH  . Pancreatitis Sister     died  age 72  . Diabetes Son   . Diabetes Daughter   . Colon cancer Maternal Aunt 81  . Colon cancer Maternal Aunt 78  . Colon cancer Maternal Aunt 76  . Colon cancer Maternal Uncle 40    EXAM: BP 160/53 mmHg  Pulse 63  Temp(Src) 98 F (36.7 C) (Oral)  Resp 14  Ht 5' (1.524 m)  Wt 137 lb (62.143 kg)  BMI 26.76 kg/m2  SpO2 99% CONSTITUTIONAL: Alert and oriented and responds appropriately to questions. Well-appearing; well-nourished, elderly, in no distress, pleasant, smiling HEAD: Normocephalic EYES: Conjunctivae clear, PERRL ENT: normal nose; no rhinorrhea; moist mucous membranes; pharynx without lesions noted NECK: Supple, no meningismus, no LAD  CARD: RRR; S1 and S2 appreciated; no murmurs, no clicks, no rubs, no gallops CHEST:  No tenderness over the chest wall, crepitus, ecchymosis or deformity, no rash RESP: Normal chest excursion without splinting or tachypnea; breath sounds clear and equal bilaterally; no wheezes, no rhonchi, no rales, no hypoxia or respiratory distress, speaking full sentences ABD/GI: Normal bowel sounds; non-distended; soft, non-tender, no rebound, no guarding, no peritoneal signs BACK:  The back appears normal and is non-tender to palpation, there is no CVA tenderness, I am unable to reproduce patient's pain with palpation of her back, no midline spinal tenderness or step-off or deformity EXT: Healed surgical scar to the right hip with no erythema, warmth, tenderness, Normal ROM in all joints; non-tender to palpation; no edema; normal capillary refill; no cyanosis, no calf tenderness or swelling    SKIN: Normal color for age and race; warm NEURO: Moves all extremities equally, sensation to light touch  intact diffusely, cranial nerves II through XII intact PSYCH: The patient's mood and manner are appropriate. Grooming and personal hygiene are appropriate.  MEDICAL DECISION MAKING: Patient here with chest pain. It is central to right-sided but does have some concerning features including associated nausea, dizziness and shortness of breath. She is having shortness of breath with exertion. We'll give aspirin, nitroglycerin. We'll obtain labs including troponin, d-dimer and BNP. We'll obtain chest x-ray. She does not appear volume overloaded on exam. She is at risk for pulmonary embolus given her recent hip replacement.  ED PROGRESS: 5:30 AM  Labs  show normal troponin. BNP is 108. D-dimer is elevated at 1.68. Chest x-ray clear. We'll proceed with CT angio of patient's chest.   6:30 AM  Pt has no relief in pain with nitroglycerin. She does report her pressure has already improved and is almost gone on its own.     7:00 AM  Pt's CT scan shows no pulmonary embolus. She is chest pain-free. Her son Hilliard Clark who works in bed placement is requesting that she [facility]. Will discuss with hospitalist for admission for chest pain rule out.   7:30 AM  D/w Dr. Sarajane Jews who will see pt and family in ED to discuss admission here versus [facility].     EKG Interpretation  Date/Time:  Wednesday March 04 2015 04:13:07 EST Ventricular Rate:  56 PR Interval:  246 QRS Duration: 106 QT Interval:  428 QTC Calculation: 413 R Axis:   32 Text Interpretation:  Sinus rhythm Prolonged PR interval RSR' in V1 or V2, probably normal variant No significant change since last tracing in 2015 Confirmed by WARD,  DO, KRISTEN [phone]) on 03/04/2015 4:18:17 AM         [place], DO 03/04/15 [id number]

## 2015-03-04 NOTE — Progress Notes (Signed)
TR band removed, no signs of active bleeding, no complaints of pain or discomfort. Will continue to monitor site.  [name]

## 2015-03-04 NOTE — H&P (Signed)
History and Physical  Mackenzie [name] [id number] DOB: 23-Aug-1940 DOA: 03/04/2015  Referring physician: Delice Bison Ward, DO PCP: Mackenzie Noble, MD   Chief Complaint: Chest pain   HPI:  75 yof PMHx of HTN and HLD presented with chest pain that began around 9pm last night. While in the ED, vital signs were stable. Labs revealed a D-dimer of 1.68 but were otherwise unremarkable, including a negative troponin. CTA of the chest was negative for PE and CXR was unremarkable. She will be admitted for further management and cardiac rule out.   Patient reports that her chest pain is located in the central to right side of her chest. Started 9 Pm 1/17 and continued all night, relieved in ED. It is a constant 8/10 pressure that radiates into her right shoulder blade. She is unaware of any alleviating or exacerbating factors and took Alprazolam PTA with no relief.  She denies any diaphoresis, SOB, fever, chills, cough, HA, LE swelling, or visual changes. Patient states that over the past several months she has been having recurring episodes of chest pain while exerting herself. Per son, pain was worse over the last week.   Her last cardiac catheterization was in 2000 which she reports was normal. Last stress test was in 2013 and stress echo in 2014. She recently had her right hip replaced 2 months ago and was cleared for surgery from a cardiac standpoint.   In the emergency department VSS, afebrile, not hypoxic, mildly hypertensive Pertinent labs: Troponin negative, d-dimer 1.68. Labs otherwise unremarkable.  EKG: Independently reviewed. SR, 1st degree AVB, no acute changes Imaging: independently reviewed. No acute disease  Review of Systems:  Positive for chest pain, rash followed by dermatology, sore throat Negative for fever, visual changes,  new muscle aches,  dysuria, bleeding, SOB, n/v/abdominal pain.  Past Medical History  Diagnosis Date  . Hyperlipidemia     takes Pravastatin daily  . CHF (congestive  heart failure) ([place])   . Anxiety     takes Xanax daily as needed  . Hypertension     takes Metoprolol and Azor daily  . Depression     takes Citalopram daily  . Rheumatic fever     hx of  . Wheezing on expiration   . Pneumonia     hx of-2015  . History of bronchitis     2014  . History of migraine   . Dizziness     cardiologist is aware and told pt it was related to meds  . Arthritis   . Joint pain   . History of colon polyps     benign  . Anemia, normocytic normochromic 01/2012    hx of  . Cataract     right eye    Past Surgical History  Procedure Laterality Date  . Cholecystectomy    . Abdominal hysterectomy    . Colonoscopy  2005    Negative screening study  . Colonoscopy N/A 04/04/2014    Procedure: COLONOSCOPY;  Surgeon: Mackenzie Houston, MD;  Location: [facility];  Service: Endoscopy;  Laterality: N/A;  1225  . Exploratory laparotomy      x 3  . Cardiac catheterization      early 2000's  . Appendectomy    . Cataract surgery Left   . Total hip arthroplasty Right 12/16/2014    Procedure: TOTAL HIP ARTHROPLASTY ANTERIOR APPROACH;  Surgeon: Mackenzie Butters, MD;  Location: [facility];  Service: Orthopedics;  Laterality: Right;    Social History:  reports that she has quit smoking. She has never used smokeless tobacco. She reports that she drinks alcohol. She reports that she does not use illicit drugs. lives with their family Self-care  Allergies  Allergen Reactions  . Statins Other (See Comments)    Atorvastatin, pravastatin, simvastatin->myalgias     Family History  Problem Relation Age of Onset  . Heart attack Mother 48    heart problems  . Cancer Mother     uterus  . Cancer      FH  . Diabetes      FH  . Hypertension      FH  . Pancreatitis Sister     died  age 21  . Diabetes Son   . Diabetes Daughter   . Colon cancer Maternal Aunt 81  . Colon cancer Maternal Aunt 78  . Colon cancer Maternal Aunt 76  . Colon cancer Maternal Uncle 40      Prior to Admission medications   Medication Sig Start Date End Date Taking? Authorizing Provider  ALPRAZolam Mackenzie [name]) 0.5 MG tablet Take 0.5 mg by mouth 2 (two) times daily as needed. Anxiety/sleep aid   Yes Historical Provider, MD  amLODipine-olmesartan (AZOR) 5-40 MG per tablet Take 1 tablet by mouth at bedtime.    Yes Historical Provider, MD  aspirin 81 MG tablet Take 81 mg by mouth daily.   Yes Historical Provider, MD  citalopram (CELEXA) 10 MG tablet Take 10 mg by mouth daily.   Yes Historical Provider, MD  Cyanocobalamin (VITAMIN B-12 IJ) Inject 1 mL as directed every 30 (thirty) days.   Yes Historical Provider, MD  HYDROcodone-acetaminophen (NORCO) 5-325 MG tablet Take 1-2 tablets by mouth every 6 (six) hours as needed for moderate pain. 12/16/14  Yes Mackenzie Kelly, PA-C  metoprolol succinate (TOPROL-XL) 25 MG 24 hr tablet Take 25 mg by mouth daily.     Yes Historical Provider, MD  Multiple Vitamin (MULTIVITAMIN WITH MINERALS) TABS Take 1 tablet by mouth daily.   Yes Historical Provider, MD  pravastatin (PRAVACHOL) 40 MG tablet Take 40 mg by mouth 3 (three) times a week.    Yes Historical Provider, MD  aspirin 325 MG tablet Take 1 tablet (325 mg total) by mouth daily. 12/16/14   Mackenzie Claiborne Billings, PA-C  clobetasol cream (TEMOVATE) AB-123456789 % Apply 1 application topically daily as needed. 02/17/15   Historical Provider, MD  docusate sodium (COLACE) 100 MG capsule Take 1 capsule (100 mg total) by mouth 2 (two) times daily. 12/16/14   Mackenzie Claiborne Billings, PA-C  methocarbamol (ROBAXIN) 500 MG tablet Take 1 tablet (500 mg total) by mouth 4 (four) times daily. 12/16/14   Mackenzie Claiborne Billings, PA-C  ondansetron (ZOFRAN) 4 MG tablet Take 1 tablet (4 mg total) by mouth every 8 (eight) hours as needed for nausea or vomiting. 12/16/14   Mackenzie Claiborne Billings, PA-C  permethrin (ELIMITE) 5 % cream Apply 1 application topically daily as needed. 02/12/15   Historical Provider, MD  triamcinolone cream (KENALOG) 0.1 % Apply 1  application topically daily as needed. 02/27/15   Historical Provider, MD   Physical Exam: Filed Vitals:   03/04/15 0500 03/04/15 0530 03/04/15 0600 03/04/15 0630  BP: 148/76 123/52 128/60 125/45  Pulse: 65 60 55 58  Temp:      TempSrc:      Resp: 14 21 15 14   Height:      Weight:      SpO2: 98% 98% 95% 94%    VSS, afebrile, hypertensive.  General:  Appears calm and comfortable Eyes: PERRL, normal lids, irises & conjunctiva ENT: grossly normal hearing, lips & tongue Neck: no LAD, masses or thyromegaly Cardiovascular: RRR, no m/r/g. No LE edema. Respiratory: CTA bilaterally, no w/r/r. Normal respiratory effort. Abdomen: soft, ntnd Skin: no induration seen on limited exam. Erythema over the left AC.  Musculoskeletal: grossly normal tone BUE/BLE Psychiatric: grossly normal mood and affect, speech fluent and appropriate Neurologic: grossly non-focal.  Wt Readings from Last 3 Encounters:  03/04/15 62.143 kg (137 lb)  12/16/14 65.772 kg (145 lb)  12/04/14 65.772 kg (145 lb)    Labs on Admission:  Basic Metabolic Panel:  Recent Labs Lab 03/04/15 0420  NA 141  K 4.2  CL 105  CO2 29  GLUCOSE 109*  BUN 31*  CREATININE 0.86  CALCIUM 9.4    Liver Function Tests:  Recent Labs Lab 03/04/15 0420  AST 18  ALT 12*  ALKPHOS 104  BILITOT 0.4  PROT 7.1  ALBUMIN 3.7    Recent Labs Lab 03/04/15 0420  LIPASE 35     CBC:  Recent Labs Lab 03/04/15 0420  WBC 6.9  HGB 10.3*  HCT 32.8*  MCV 91.9  PLT 315    Cardiac Enzymes:  Recent Labs Lab 03/04/15 0420  TROPONINI <0.03      Radiological Exams on Admission: Dg Chest 2 View  03/04/2015  CLINICAL DATA:  75 year old female with chest pain EXAM: CHEST  2 VIEW COMPARISON:  Radiograph dated 12/04/2014 FINDINGS: Two views of the chest do not demonstrate focal consolidation. Stable appearing 5 mm nodular density in the right mid lung corresponds to the calcified subpleural nodule seen on the CT. There is no  pleural effusion or pneumothorax. Stable cardiac silhouette. Old healed right rib fractures. IMPRESSION: No active cardiopulmonary disease. Electronically Signed   By: Anner Crete M.D.   On: 03/04/2015 04:55   Ct Angio Chest Pe W/cm &/or Wo Cm  03/04/2015  CLINICAL DATA:  75 year old female with right-sided chest pressure radiating to the back EXAM: CT ANGIOGRAPHY CHEST WITH CONTRAST TECHNIQUE: Multidetector CT imaging of the chest was performed using the standard protocol during bolus administration of intravenous contrast. Multiplanar CT image reconstructions and MIPs were obtained to evaluate the vascular anatomy. CONTRAST:  173mL OMNIPAQUE IOHEXOL 350 MG/ML SOLN COMPARISON:  Chest radiograph dated 03/04/2015 FINDINGS: There is mild increased interstitial prominence predominantly at the lung bases. Mild bibasilar airspace ground-glass density also noted likely atelectatic changes. Pneumonia is less likely. There is no focal consolidation, pleural effusion, or pneumothorax. Small calcified subpleural nodule in the right lower lobe stable compared to the prior study. The central airways are patent There is atherosclerotic calcification of the thoracic aorta. No CT evidence of pulmonary embolism. Mild cardiomegaly. No significant pericardial effusion. There is no hilar or mediastinal adenopathy. Right hilar calcified lymph node likely related to old granuloma. The esophagus is grossly unremarkable. There is no axillary adenopathy. The chest wall soft tissues appear unremarkable. There is degenerative changes of the spine. Old healed right posterior rib fractures. No acute fracture. The visualized upper abdomen is unremarkable. Review of the MIP images confirms the above findings. IMPRESSION: No CT evidence of pulmonary embolism. Electronically Signed   By: Anner Crete M.D.   On: 03/04/2015 06:32      Active Problems:   * No active hospital problems. *   Assessment/Plan 1. Chest pain with some  typical features and DOE, concern for angina, especially given recurrent symptoms with exertion and reportedly worse over  the last week. EKG nonacute. Troponin negative. Negative Lexiscan stress test in 2013 and negative stress echocardiogram in 2014 per Dr. Francesca Oman note 10/2014, at which time she was cleared for orthopedic surgery. Currently pain free. 2. HLD, continue statin 3. Hypertension, BP stable. 4. Anemia of chronic disease, Hgb 10.3.   Plan obs. Rule out ACS. Cardiology evaluation, will likely need stress imaging or even LHC. Family requests transfer to [facility], does not want to stay at [facility]. Discussed with son Hilliard Clark at bedside, works in bed placement at [employer]. Patient requests transfer to [facility] as well.   [facility] 11 Dr. Barbaraann Faster  Code Status: Full  DVT prophylaxis:Lovenox Family Communication:Son at bedside. Discussed plan with patient and son. Disposition Plan/Anticipated LOS: Transfer to [employer] [facility].   Time spent: 55 minutes  Murray Hodgkins, MD  [facility] Pager [phone] 03/04/2015, 8:31 AM   By signing my name below, I, Rosalie Doctor attest that this documentation has been prepared under the direction and in the presence of Murray Hodgkins, MD Electronically signed: Rosalie Doctor, Scribe.  03/04/2015 8:28am  I personally performed the services described in this documentation. All medical record entries made by the scribe were at my direction. I have reviewed the chart and agree that the record reflects my personal performance and is accurate and complete. Murray Hodgkins, MD

## 2015-03-05 ENCOUNTER — Encounter (HOSPITAL_COMMUNITY): Payer: Self-pay | Admitting: Cardiovascular Disease

## 2015-03-05 DIAGNOSIS — I2584 Coronary atherosclerosis due to calcified coronary lesion: Secondary | ICD-10-CM | POA: Diagnosis not present

## 2015-03-05 DIAGNOSIS — I200 Unstable angina: Secondary | ICD-10-CM | POA: Diagnosis not present

## 2015-03-05 DIAGNOSIS — R0789 Other chest pain: Secondary | ICD-10-CM

## 2015-03-05 DIAGNOSIS — I25110 Atherosclerotic heart disease of native coronary artery with unstable angina pectoris: Secondary | ICD-10-CM | POA: Diagnosis not present

## 2015-03-05 DIAGNOSIS — K219 Gastro-esophageal reflux disease without esophagitis: Secondary | ICD-10-CM | POA: Diagnosis not present

## 2015-03-05 LAB — TROPONIN I: Troponin I: <0.03 ng/mL (ref <0.031)

## 2015-03-05 MED ORDER — IBUPROFEN 200 MG PO TABS: 400.0000 mg | ORAL_TABLET | Freq: Three times a day (TID) | ORAL | Status: DC

## 2015-03-05 MED ORDER — OMEPRAZOLE 40 MG PO CPDR: 40.0000 mg | DELAYED_RELEASE_CAPSULE | Freq: Every day | ORAL | Status: DC

## 2015-03-05 MED FILL — Heparin Sodium (Porcine) 2 Unit/ML in Sodium Chloride 0.9%: INTRAMUSCULAR | Qty: 1000 | Status: AC

## 2015-03-05 NOTE — Discharge Summary (Signed)
Physician Discharge Summary  GREG CAPPA [id number] DOB: Jan 02, 1941 DOA: 03/04/2015  PCP: Asencion Noble, MD  Admit date: 03/04/2015 Discharge date: 03/05/2015  Time spent: 25 minutes  Recommendations for Outpatient Follow-up:  1. Recommend outpatient workup for noncardiogenic chest pain and a trial of ibuprofen 400 3 times a day X3 days as well as omeprazole 40 daily for reflux 2. No further cardiac workup indicated 3. Outpatient labs as when necessary  Discharge Diagnoses:  Principal Problem:   Unstable angina [facility]) Active Problems:   Hyperlipidemia   Hypertension   Bilateral carotid artery disease (HCC)   Chest pain   Essential hypertension   Dyslipidemia   Discharge Condition: Improved  Diet recommendation: Heart healthy low-salt  Filed Weights   03/04/15 0412 03/04/15 1108  Weight: 62.143 kg (137 lb) 62.2 kg (137 lb 2 oz)    History of present illness:  75 y/o ? H/o HTN Hld Prior H/o Rheum fever c MV prolapse Normocytic anemia Migraines Carotid artery disease 40-59% in 04/2013 Cath 2000 non-obst CAD, Last Stress 2013, Neg stress echo 2014 Ext hermorrhoids on colonoscopy 2005 Prior Tob abuse  Admitted from [facility] 1/18 c CP Features were atypical, patient has multiple risk factors including family history and premature CAD Decision made by cardiology for left heart cath as resulted below   Patient was felt to not have any acute life-threatening illness causing her heart issues she had a negative CT angiogram for common embolism as well and it was felt B could treat potential modifiable diseases such as reflux as well as costochondritis--it was felt costochondritis may be here in the differential she had chest pain on direct palpation and has recently been using a walker after right-sided hip surgery which coincides with her right-sided chest pain  Hospital Course:  Coronary Findings    Dominance: Right   Left Main  Calcified but no significant disease     Left  Anterior Descending  The LAD has mild luminal irregularities     Left Circumflex   . Mid Cx lesion, 30% stenosed. Calcified.     Right Coronary Artery   . Mid RCA lesion, 40% stenosed. Calcified. There is calcium in the right coronary cusp extending up the ascending aorta      Wall Motion                 Coronary Diagrams    Diagnostic Diagram            Implants     Discharge Exam: Filed Vitals:   03/04/15 2020 03/05/15 0459  BP: 118/47 130/40  Pulse: 71 77  Temp: 98.3 F (36.8 C) 98.9 F (37.2 C)  Resp: 18 18    General: Alert pleasant oriented Cardiovascular: S1-S2 no murmur rub or gallop Respiratory: Clinically clear no added sound  Discharge Instructions   Discharge Instructions    Diet - low sodium heart healthy    Complete by:  As directed      Discharge instructions    Complete by:  As directed   Your chest pain was unlikely to be cardiac I would have this evaluated further as an outpatient with your primary physician for reflux or other findings We have prescribed for you ibuprofen 400 mg to take 3 times a day for 3 days in the eventuality that this is an inflammation of the joints around her rib cage I also would recommend starting omeprazole 40 mg daily for potential reflux We have not made any huge changes to your  medications Please follow-up with Dr. Willey Blade in about a week     Increase activity slowly    Complete by:  As directed           Current Discharge Medication List    START taking these medications   Details  ibuprofen (MOTRIN IB) 200 MG tablet Take 2 tablets (400 mg total) by mouth 3 (three) times daily. Qty: 30 tablet, Refills: 0    omeprazole (PRILOSEC) 40 MG capsule Take 1 capsule (40 mg total) by mouth daily. Qty: 30 capsule, Refills: 0      CONTINUE these medications which have NOT CHANGED   Details  ALPRAZolam (XANAX) 0.5 MG tablet Take 0.5 mg by mouth 2 (two) times daily as needed. Anxiety/sleep aid      amLODipine-olmesartan (AZOR) 5-40 MG per tablet Take 1 tablet by mouth at bedtime.     aspirin 81 MG tablet Take 81 mg by mouth daily.    citalopram (CELEXA) 10 MG tablet Take 10 mg by mouth daily.    clobetasol cream (TEMOVATE) AB-123456789 % Apply 1 application topically daily as needed.    Cyanocobalamin (VITAMIN B-12 IJ) Inject 1 mL as directed every 30 (thirty) days.    HYDROcodone-acetaminophen (NORCO) 5-325 MG tablet Take 1-2 tablets by mouth every 6 (six) hours as needed for moderate pain. Qty: 90 tablet, Refills: 0    metoprolol succinate (TOPROL-XL) 25 MG 24 hr tablet Take 25 mg by mouth daily.      permethrin (ELIMITE) 5 % cream Apply 1 application topically daily as needed.    pravastatin (PRAVACHOL) 40 MG tablet Take 40 mg by mouth 3 (three) times a week.     triamcinolone cream (KENALOG) 0.1 % Apply 1 application topically daily as needed.    docusate sodium (COLACE) 100 MG capsule Take 1 capsule (100 mg total) by mouth 2 (two) times daily. Qty: 10 capsule, Refills: 0    methocarbamol (ROBAXIN) 500 MG tablet Take 1 tablet (500 mg total) by mouth 4 (four) times daily. Qty: 60 tablet, Refills: 0    ondansetron (ZOFRAN) 4 MG tablet Take 1 tablet (4 mg total) by mouth every 8 (eight) hours as needed for nausea or vomiting. Qty: 20 tablet, Refills: 0      STOP taking these medications     Multiple Vitamin (MULTIVITAMIN WITH MINERALS) TABS        Allergies  Allergen Reactions  . Statins Other (See Comments)    Atorvastatin, pravastatin, simvastatin->myalgias       The results of significant diagnostics from this hospitalization (including imaging, microbiology, ancillary and laboratory) are listed below for reference.    Significant Diagnostic Studies: Dg Chest 2 View  03/04/2015  CLINICAL DATA:  75 year old female with chest pain EXAM: CHEST  2 VIEW COMPARISON:  Radiograph dated 12/04/2014 FINDINGS: Two views of the chest do not demonstrate focal consolidation.  Stable appearing 5 mm nodular density in the right mid lung corresponds to the calcified subpleural nodule seen on the CT. There is no pleural effusion or pneumothorax. Stable cardiac silhouette. Old healed right rib fractures. IMPRESSION: No active cardiopulmonary disease. Electronically Signed   By: Anner Crete M.D.   On: 03/04/2015 04:55   Ct Angio Chest Pe W/cm &/or Wo Cm  03/04/2015  CLINICAL DATA:  75 year old female with right-sided chest pressure radiating to the back EXAM: CT ANGIOGRAPHY CHEST WITH CONTRAST TECHNIQUE: Multidetector CT imaging of the chest was performed using the standard protocol during bolus administration of intravenous contrast. Multiplanar  CT image reconstructions and MIPs were obtained to evaluate the vascular anatomy. CONTRAST:  178mL OMNIPAQUE IOHEXOL 350 MG/ML SOLN COMPARISON:  Chest radiograph dated 03/04/2015 FINDINGS: There is mild increased interstitial prominence predominantly at the lung bases. Mild bibasilar airspace ground-glass density also noted likely atelectatic changes. Pneumonia is less likely. There is no focal consolidation, pleural effusion, or pneumothorax. Small calcified subpleural nodule in the right lower lobe stable compared to the prior study. The central airways are patent There is atherosclerotic calcification of the thoracic aorta. No CT evidence of pulmonary embolism. Mild cardiomegaly. No significant pericardial effusion. There is no hilar or mediastinal adenopathy. Right hilar calcified lymph node likely related to old granuloma. The esophagus is grossly unremarkable. There is no axillary adenopathy. The chest wall soft tissues appear unremarkable. There is degenerative changes of the spine. Old healed right posterior rib fractures. No acute fracture. The visualized upper abdomen is unremarkable. Review of the MIP images confirms the above findings. IMPRESSION: No CT evidence of pulmonary embolism. Electronically Signed   By: Anner Crete  M.D.   On: 03/04/2015 06:32    Microbiology: No results found for this or any previous visit (from the past 240 hour(s)).   Labs: Basic Metabolic Panel:  Recent Labs Lab 03/04/15 0420  NA 141  K 4.2  CL 105  CO2 29  GLUCOSE 109*  BUN 31*  CREATININE 0.86  CALCIUM 9.4   Liver Function Tests:  Recent Labs Lab 03/04/15 0420  AST 18  ALT 12*  ALKPHOS 104  BILITOT 0.4  PROT 7.1  ALBUMIN 3.7    Recent Labs Lab 03/04/15 0420  LIPASE 35   No results for input(s): AMMONIA in the last 168 hours. CBC:  Recent Labs Lab 03/04/15 0420  WBC 6.9  HGB 10.3*  HCT 32.8*  MCV 91.9  PLT 315   Cardiac Enzymes:  Recent Labs Lab 03/04/15 0420 03/04/15 1310 03/04/15 2102 03/05/15 0148  TROPONINI <0.03 <0.03 <0.03 <0.03   BNP: BNP (last 3 results)  Recent Labs  03/04/15 0420  BNP 108.0*    ProBNP (last 3 results) No results for input(s): PROBNP in the last 8760 hours.  CBG: No results for input(s): GLUCAP in the last 168 hours.     SignedNita Sells MD   [facility] 03/05/2015, 10:57 AM

## 2015-03-05 NOTE — Care Management Obs Status (Signed)
[facility] NOTIFICATION   Patient Details  Name: QUENNA WIX MRN: [id number] Date of Birth: May 06, 1940   Medicare Observation Status Notification Given:  Yes    Maryclare Labrador, RN 03/05/2015, 10:13 AM

## 2015-03-19 DIAGNOSIS — Z6827 Body mass index (BMI) 27.0-27.9, adult: Secondary | ICD-10-CM | POA: Diagnosis not present

## 2015-03-19 DIAGNOSIS — F419 Anxiety disorder, unspecified: Secondary | ICD-10-CM | POA: Diagnosis not present

## 2015-03-19 DIAGNOSIS — I10 Essential (primary) hypertension: Secondary | ICD-10-CM | POA: Diagnosis not present

## 2015-03-19 DIAGNOSIS — L500 Allergic urticaria: Secondary | ICD-10-CM | POA: Diagnosis not present

## 2015-04-02 DIAGNOSIS — L509 Urticaria, unspecified: Secondary | ICD-10-CM | POA: Diagnosis not present

## 2015-04-03 DIAGNOSIS — Z96641 Presence of right artificial hip joint: Secondary | ICD-10-CM | POA: Diagnosis not present

## 2015-04-22 DIAGNOSIS — Z96641 Presence of right artificial hip joint: Secondary | ICD-10-CM | POA: Diagnosis not present

## 2015-04-24 DIAGNOSIS — J3089 Other allergic rhinitis: Secondary | ICD-10-CM | POA: Diagnosis not present

## 2015-04-24 DIAGNOSIS — L509 Urticaria, unspecified: Secondary | ICD-10-CM | POA: Diagnosis not present

## 2015-05-06 DIAGNOSIS — R062 Wheezing: Secondary | ICD-10-CM | POA: Diagnosis not present

## 2015-05-11 DIAGNOSIS — R21 Rash and other nonspecific skin eruption: Secondary | ICD-10-CM | POA: Diagnosis not present

## 2015-05-11 DIAGNOSIS — L509 Urticaria, unspecified: Secondary | ICD-10-CM | POA: Diagnosis not present

## 2015-05-15 DIAGNOSIS — Z96641 Presence of right artificial hip joint: Secondary | ICD-10-CM | POA: Diagnosis not present

## 2015-06-03 DIAGNOSIS — L509 Urticaria, unspecified: Secondary | ICD-10-CM | POA: Diagnosis not present

## 2015-06-08 DIAGNOSIS — L509 Urticaria, unspecified: Secondary | ICD-10-CM | POA: Diagnosis not present

## 2015-06-10 DIAGNOSIS — L509 Urticaria, unspecified: Secondary | ICD-10-CM | POA: Diagnosis not present

## 2015-06-12 DIAGNOSIS — L509 Urticaria, unspecified: Secondary | ICD-10-CM | POA: Diagnosis not present

## 2015-07-10 DIAGNOSIS — Z96641 Presence of right artificial hip joint: Secondary | ICD-10-CM | POA: Diagnosis not present

## 2015-07-14 DIAGNOSIS — F329 Major depressive disorder, single episode, unspecified: Secondary | ICD-10-CM | POA: Diagnosis not present

## 2015-07-14 DIAGNOSIS — I10 Essential (primary) hypertension: Secondary | ICD-10-CM | POA: Diagnosis not present

## 2015-07-14 DIAGNOSIS — Z79899 Other long term (current) drug therapy: Secondary | ICD-10-CM | POA: Diagnosis not present

## 2015-07-14 DIAGNOSIS — E785 Hyperlipidemia, unspecified: Secondary | ICD-10-CM | POA: Diagnosis not present

## 2015-07-20 DIAGNOSIS — I10 Essential (primary) hypertension: Secondary | ICD-10-CM | POA: Diagnosis not present

## 2015-07-20 DIAGNOSIS — L500 Allergic urticaria: Secondary | ICD-10-CM | POA: Diagnosis not present

## 2015-07-20 DIAGNOSIS — E785 Hyperlipidemia, unspecified: Secondary | ICD-10-CM | POA: Diagnosis not present

## 2015-07-28 DIAGNOSIS — L309 Dermatitis, unspecified: Secondary | ICD-10-CM | POA: Diagnosis not present

## 2015-07-28 DIAGNOSIS — L509 Urticaria, unspecified: Secondary | ICD-10-CM | POA: Diagnosis not present

## 2015-08-24 DIAGNOSIS — L509 Urticaria, unspecified: Secondary | ICD-10-CM | POA: Diagnosis not present

## 2015-08-24 DIAGNOSIS — L309 Dermatitis, unspecified: Secondary | ICD-10-CM | POA: Diagnosis not present

## 2015-08-24 DIAGNOSIS — Z5181 Encounter for therapeutic drug level monitoring: Secondary | ICD-10-CM | POA: Diagnosis not present

## 2015-08-24 DIAGNOSIS — Z79899 Other long term (current) drug therapy: Secondary | ICD-10-CM | POA: Diagnosis not present

## 2015-09-08 DIAGNOSIS — Z5181 Encounter for therapeutic drug level monitoring: Secondary | ICD-10-CM | POA: Diagnosis not present

## 2015-09-08 DIAGNOSIS — L309 Dermatitis, unspecified: Secondary | ICD-10-CM | POA: Diagnosis not present

## 2015-09-08 DIAGNOSIS — L509 Urticaria, unspecified: Secondary | ICD-10-CM | POA: Diagnosis not present

## 2015-09-23 ENCOUNTER — Emergency Department (HOSPITAL_COMMUNITY): Payer: Medicare Other

## 2015-09-23 ENCOUNTER — Emergency Department (HOSPITAL_COMMUNITY)
Admission: EM | Admit: 2015-09-23 | Discharge: 2015-09-23 | Disposition: A | Discharge status: Home / Self Care | Payer: Medicare Other | Attending: Emergency Medicine | Admitting: Emergency Medicine

## 2015-09-23 ENCOUNTER — Encounter (HOSPITAL_COMMUNITY): Payer: Self-pay | Admitting: *Deleted

## 2015-09-23 DIAGNOSIS — I110 Hypertensive heart disease with heart failure: Secondary | ICD-10-CM | POA: Diagnosis not present

## 2015-09-23 DIAGNOSIS — M25552 Pain in left hip: Secondary | ICD-10-CM | POA: Diagnosis not present

## 2015-09-23 DIAGNOSIS — Z87891 Personal history of nicotine dependence: Secondary | ICD-10-CM | POA: Insufficient documentation

## 2015-09-23 DIAGNOSIS — Z79899 Other long term (current) drug therapy: Secondary | ICD-10-CM | POA: Diagnosis not present

## 2015-09-23 DIAGNOSIS — S32010A Wedge compression fracture of first lumbar vertebra, initial encounter for closed fracture: Secondary | ICD-10-CM | POA: Diagnosis not present

## 2015-09-23 DIAGNOSIS — S32000A Wedge compression fracture of unspecified lumbar vertebra, initial encounter for closed fracture: Secondary | ICD-10-CM

## 2015-09-23 DIAGNOSIS — I509 Heart failure, unspecified: Secondary | ICD-10-CM | POA: Diagnosis not present

## 2015-09-23 DIAGNOSIS — Y999 Unspecified external cause status: Secondary | ICD-10-CM | POA: Diagnosis not present

## 2015-09-23 DIAGNOSIS — Y929 Unspecified place or not applicable: Secondary | ICD-10-CM | POA: Insufficient documentation

## 2015-09-23 DIAGNOSIS — Y939 Activity, unspecified: Secondary | ICD-10-CM | POA: Diagnosis not present

## 2015-09-23 DIAGNOSIS — S79912A Unspecified injury of left hip, initial encounter: Secondary | ICD-10-CM | POA: Diagnosis not present

## 2015-09-23 DIAGNOSIS — W182XXA Fall in (into) shower or empty bathtub, initial encounter: Secondary | ICD-10-CM | POA: Diagnosis not present

## 2015-09-23 DIAGNOSIS — IMO0002 Reserved for concepts with insufficient information to code with codable children: Secondary | ICD-10-CM

## 2015-09-23 DIAGNOSIS — S3992XA Unspecified injury of lower back, initial encounter: Secondary | ICD-10-CM | POA: Diagnosis present

## 2015-09-23 MED ORDER — OXYCODONE-ACETAMINOPHEN 5-325 MG PO TABS
2.0000 | ORAL_TABLET | Freq: Once | ORAL | Status: AC
  Administered 2015-09-23: 2 via ORAL
  Filled 2015-09-23: qty 2

## 2015-09-23 MED ORDER — ONDANSETRON 4 MG PO TBDP
4.0000 mg | ORAL_TABLET | Freq: Once | ORAL | Status: AC
Start: 2015-09-23 — End: 2015-09-23
  Administered 2015-09-23: 4 mg via ORAL
  Filled 2015-09-23: qty 1

## 2015-09-23 MED ORDER — DOCUSATE SODIUM 100 MG PO CAPS: 100.0000 mg | ORAL_CAPSULE | Freq: Two times a day (BID) | ORAL | 0 refills | Status: DC

## 2015-09-23 MED ORDER — OXYCODONE-ACETAMINOPHEN 5-325 MG PO TABS: 1.0000 | ORAL_TABLET | ORAL | 0 refills | Status: DC | PRN

## 2015-09-23 MED ORDER — ONDANSETRON 4 MG PO TBDP: 4.0000 mg | ORAL_TABLET | Freq: Three times a day (TID) | ORAL | 0 refills | Status: DC | PRN

## 2015-09-23 NOTE — ED Triage Notes (Signed)
Pt comes in for a fall. She has history of right hip replacement and that hip gave out on here. This caused her to fall. She landed with her lower back hitting the side of the tub.

## 2015-09-23 NOTE — Discharge Instructions (Signed)
Avoid upright activity until your symptoms improved.  Percocet for pain. Use Colace to avoid constipation.  Push fluids and stay hydrated.  Follow-up with your physician Dr. Willey Blade, or neurosurgery Dr. Cyndy Freeze within the next 7-10 days if your symptoms are not improving to discuss the possibility of a procedure called a kyphoplasty.

## 2015-09-23 NOTE — ED Provider Notes (Signed)
[facility] DEPT Provider Note   CSN: [id number] Arrival date & time: 09/23/15  1006  First Provider Contact:   First MD Initiated Contact with Patient 09/23/15 1028     By signing my name below, I, Rayna Sexton, attest that this documentation has been prepared under the direction and in the presence of Tanna Furry, MD. Electronically Signed: Rayna Sexton, ED Scribe. 09/23/15. 10:43 AM.   History   Chief Complaint Chief Complaint  Patient presents with  . Fall    HPI HPI Comments: Mackenzie [name] is a 75 y.o. female with a PSHx including total right hip arthroplasty who presents to the Emergency Department complaining of a fall that occurred last night. Pt states that she has to lift her right leg to get dressed each morning due to chronic RLE weakness following her right hip arthroplasty. She was lifting her right leg last night to get dressed in the bathroom after having 4-5 glasses of wine and accidentally fell striking the edge of the bathtub with her lower back and further notes she struck her head during the fall. Pt states she was able to get herself into bed last night and woke this morning with severely worsening back pain. She reports associated lower back pain, pain to her right occipital scalp and mild bruising to her RUE. Pt also reports a mild rash to her right hip which has been present since her right hip arthroplasty and denies it has acutely worsened. She takes aspirin daily. Pt denies any other associated symptoms at this time.   The history is provided by the patient and the spouse. No language interpreter was used.    Past Medical History:  Diagnosis Date  . Anemia, normocytic normochromic 01/2012   hx of  . Anxiety    takes Xanax daily as needed  . Arthritis   . Cataract    right eye  . CHF (congestive heart failure) ([place])   . Depression    takes Citalopram daily  . Dizziness    cardiologist is aware and told pt it was related to meds  . History of  bronchitis    2014  . History of colon polyps    benign  . History of migraine   . Hyperlipidemia    takes Pravastatin daily  . Hypertension    takes Metoprolol and Azor daily  . Joint pain   . Pneumonia    hx of-2015  . Rheumatic fever    hx of  . Wheezing on expiration     Patient Active Problem List   Diagnosis Date Noted  . Chest pain 03/04/2015  . Essential hypertension   . Dyslipidemia   . Primary osteoarthritis of right hip 12/16/2014  . Postoperative anemia due to acute blood loss 12/16/2014  . Bilateral carotid artery disease ([place]) 11/05/2014  . Multiple fractures of ribs of right side 06/18/2012  . Pneumothorax on right 06/18/2012  . Unstable angina ([place]) 01/31/2012  . Cerebrovascular disease 01/31/2012  . Arteriosclerotic cardiovascular disease (ASCVD)   . Hyperlipidemia   . Hypertension   . Anemia, normocytic normochromic 01/15/2012  . MITRAL VALVE PROLAPSE 12/24/2008    Past Surgical History:  Procedure Laterality Date  . ABDOMINAL HYSTERECTOMY    . APPENDECTOMY    . CARDIAC CATHETERIZATION     early 2000's  . CARDIAC CATHETERIZATION N/A 03/04/2015   Procedure: Left Heart Cath and Coronary Angiography;  Surgeon: Sherren Mocha, MD;  Location: [facility];  Service: Cardiovascular;  Laterality:  N/A;  . cataract surgery Left   . CHOLECYSTECTOMY    . COLONOSCOPY  2005   Negative screening study  . COLONOSCOPY N/A 04/04/2014   Procedure: COLONOSCOPY;  Surgeon: Rogene Houston, MD;  [facility];  Service: Endoscopy;  Laterality: N/A;  1225  . EXPLORATORY LAPAROTOMY     x 3  . TOTAL HIP ARTHROPLASTY Right 12/16/2014   Procedure: TOTAL HIP ARTHROPLASTY ANTERIOR APPROACH;  Surgeon: Renette Butters, MD;  Location: [facility];  Service: Orthopedics;  Laterality: Right;    OB History    No data available       Home Medications    Prior to Admission medications   Medication Sig Start Date End Date Taking? Authorizing Provider  ALPRAZolam  Duanne Moron) 0.5 MG tablet Take 0.5 mg by mouth 2 (two) times daily as needed. Anxiety/sleep aid   Yes Historical Provider, MD  amLODipine-olmesartan (AZOR) 5-40 MG per tablet Take 1 tablet by mouth at bedtime.    Yes Historical Provider, MD  aspirin 81 MG tablet Take 81 mg by mouth daily.   Yes Historical Provider, MD  citalopram (CELEXA) 10 MG tablet Take 10 mg by mouth daily.   Yes Historical Provider, MD  clobetasol cream (TEMOVATE) [id number] % Apply 1 application topically daily as needed. 02/17/15  Yes Historical Provider, MD  Cyanocobalamin (VITAMIN B-12 IJ) Inject 1 mL as directed every 30 (thirty) days.   Yes Historical Provider, MD  dapsone 25 MG tablet Take 25 mg by mouth daily.  09/10/15  Yes Historical Provider, MD  metoprolol succinate (TOPROL-XL) 25 MG 24 hr tablet Take 25 mg by mouth daily.     Yes Historical Provider, MD  omeprazole (PRILOSEC) 40 MG capsule Take 1 capsule (40 mg total) by mouth daily. 03/05/15  Yes Nita Sells, MD  pravastatin (PRAVACHOL) 40 MG tablet Take 40 mg by mouth 3 (three) times a week.    Yes Historical Provider, MD  triamcinolone cream (KENALOG) 0.1 % Apply 1 application topically daily as needed. 02/27/15  Yes Historical Provider, MD  docusate sodium (COLACE) 100 MG capsule Take 1 capsule (100 mg total) by mouth every 12 (twelve) hours. 09/23/15   Tanna Furry, MD  ondansetron (ZOFRAN ODT) 4 MG disintegrating tablet Take 1 tablet (4 mg total) by mouth every 8 (eight) hours as needed for nausea. 09/23/15   Tanna Furry, MD  oxyCODONE-acetaminophen (PERCOCET/ROXICET) 5-325 MG tablet Take 1-2 tablets by mouth every 4 (four) hours as needed. 09/23/15   Tanna Furry, MD    Family History Family History  Problem Relation Age of Onset  . Heart attack Mother 60    heart problems  . Cancer Mother     uterus  . Pancreatitis Sister     died  age 16  . Colon cancer Maternal Aunt 81  . Colon cancer Maternal Aunt 78  . Colon cancer Maternal Aunt 76  . Cancer      FH  .  Diabetes      FH  . Hypertension      FH  . Diabetes Son   . Diabetes Daughter   . Colon cancer Maternal Uncle 80    Social History Social History  Substance Use Topics  . Smoking status: Former Smoker    Packs/day: 1.00    Years: 15.00  . Smokeless tobacco: Never Used     Comment: quit smoking 30+yrs ago  . Alcohol use Yes     Comment: socially wine  Allergies   Statins   Review of Systems Review of Systems  Constitutional: Negative for appetite change, chills, diaphoresis, fatigue and fever.  HENT: Negative for mouth sores, sore throat and trouble swallowing.   Eyes: Negative for visual disturbance.  Respiratory: Negative for cough, chest tightness, shortness of breath and wheezing.   Cardiovascular: Negative for chest pain.  Gastrointestinal: Negative for abdominal distention, abdominal pain, diarrhea, nausea and vomiting.  Endocrine: Negative for polydipsia, polyphagia and polyuria.  Genitourinary: Negative for dysuria, frequency and hematuria.  Musculoskeletal: Positive for arthralgias, back pain and myalgias. Negative for gait problem.  Skin: Positive for color change and rash. Negative for pallor.  Neurological: Positive for weakness. Negative for dizziness, syncope, light-headedness and headaches.  Hematological: Does not bruise/bleed easily.  Psychiatric/Behavioral: Negative for behavioral problems and confusion.   Physical Exam Updated Vital Signs BP (!) 158/53   Pulse 73   Temp 98.2 F (36.8 C) (Oral)   Resp 16   Ht 5' (1.524 m)   Wt 130 lb (59 kg)   SpO2 94%   BMI 25.39 kg/m   Physical Exam  Constitutional: She is oriented to person, place, and time. She appears well-developed and well-nourished. No distress.  HENT:  Head: Normocephalic.  TTP to right occipital scalp  Eyes: Conjunctivae are normal. Pupils are equal, round, and reactive to light. No scleral icterus.  Neck: Normal range of motion. Neck supple. No thyromegaly present.    Cardiovascular: Normal rate and regular rhythm.  Exam reveals no gallop and no friction rub.   No murmur heard. Pulmonary/Chest: Effort normal and breath sounds normal. No respiratory distress. She has no wheezes. She has no rales.  Abdominal: Soft. Bowel sounds are normal. She exhibits no distension. There is no tenderness. There is no rebound.  Musculoskeletal: Normal range of motion. She exhibits tenderness.  Midline lumbar spinal tenderness; mild tenderness to right inguinal crease; right para midline spinal tenderness; TTP to right lateral thigh; bruising noted to right mid-upper arm; FROM of RUE  Neurological: She is alert and oriented to person, place, and time.  Skin: Skin is warm and dry. Ecchymosis noted. No rash noted.  Mild chronic rash noted to right hip    Psychiatric: She has a normal mood and affect. Her behavior is normal.   ED Treatments / Results  Labs (all labs ordered are listed, but only abnormal results are displayed) Labs Reviewed - No data to display  EKG  EKG Interpretation None       Radiology Dg Lumbar Spine Complete  Result Date: 09/23/2015 CLINICAL DATA:  Fall with low back pain.  Initial encounter. EXAM: LUMBAR SPINE - COMPLETE 4+ VIEW COMPARISON:  None. FINDINGS: Mild compression fracture of the L1 vertebral body noted with approximately 25-30% loss of anterior vertebral body height. Based on radiographic appearance, this is likely an acute/subacute compression fracture. No subluxation identified. Mild degenerative disc disease present at L2-3, L4-5 and L5-S1 without significant disc space narrowing. No bony lesions identified. The abdominal aorta is heavily calcified. IMPRESSION: 1. Mild L1 compression fracture with approximately 25-30% loss of anterior vertebral body height. This appears relatively acute/subacute in appearance radiographically. 2. Aortic atherosclerosis with heavily calcified abdominal aorta. Electronically Signed   By: Aletta Edouard  M.D.   On: 09/23/2015 11:28   Ct Lumbar Spine Wo Contrast  Result Date: 09/23/2015 CLINICAL DATA:  3 female post fall with lower back pain. Subsequent encounter. EXAM: CT LUMBAR SPINE WITHOUT CONTRAST TECHNIQUE: Multidetector CT imaging of the lumbar spine  was performed without intravenous contrast administration. Multiplanar CT image reconstructions were also generated. COMPARISON:  09/23/2015 plain film exam. FINDINGS: Last fully open disk space is labeled L5-S1. Present examination incorporates from T12 through S3. Compression fracture L1 vertebral body involving right aspect greater than left. 50% loss of height on the right and 30% loss height on the left. Minimal retropulsion posterior superior aspect. No other acute fracture noted. Remote small Schmorl's node deformity superior endplate L3. [id number]: Minimal bulge greater to left. L1-2: Mild bulge. L2-3: Bulge greater to left with slight encroachment upon exiting left L3 nerve root. Facet degenerative changes. Mild spinal stenosis and right greater than left lateral recess stenosis. L3-4:  Minimal bulge.  Mild facet degenerative changes. L4-5: Mild bulge. Mild facet degenerative changes. Very mild spinal stenosis. L5-S1:  No significant spinal stenosis or foraminal narrowing. Prominent aortic calcification and calcifications branch vessels. Focal bulge of the abdominal aorta at the L3 level with transverse dimension of 2.3 cm whereas just above this level the aorta measures 1.7 cm. IMPRESSION: Compression fracture L1 vertebral body involving right aspect greater than left. 50% loss of height on the right and 30% loss height on the left. Minimal retropulsion posterior superior aspect. No other acute fracture noted. Degenerative changes as detailed above. Prominent aortic calcification and calcifications branch vessels. Focal bulge of the abdominal aorta at the L3 level with transverse dimension of 2.3 cm whereas just above this level the aorta  measures 1.7 cm. Electronically Signed   By: Genia Del M.D.   On: 09/23/2015 13:03   Dg Hip Unilat W Or Wo Pelvis 2-3 Views Left  Result Date: 09/23/2015 CLINICAL DATA:  Fall with left hip pain. Initial encounter. History of prior right hip arthroplasty. EXAM: DG HIP (WITH OR WITHOUT PELVIS) 2-3V LEFT COMPARISON:  Intraoperative right hip films on 12/16/2014. FINDINGS: No acute fracture or dislocation is identified. The bony pelvis appears intact without evidence of fracture or diastasis. Visualized portions of the right hip arthroplasty appear unremarkable. IMPRESSION: No acute fracture or dislocation identified. Electronically Signed   By: Aletta Edouard M.D.   On: 09/23/2015 11:30    Procedures Procedures  DIAGNOSTIC STUDIES: Oxygen Saturation is 94% on RA, adequate by my interpretation.    COORDINATION OF CARE: 10:38 AM Discussed next steps with pt. Pt verbalized understanding and is agreeable with the plan.   Medications Ordered in ED Medications  oxyCODONE-acetaminophen (PERCOCET/ROXICET) 5-325 MG per tablet 2 tablet (2 tablets Oral Given 09/23/15 1048)  ondansetron (ZOFRAN-ODT) disintegrating tablet 4 mg (4 mg Oral Given 09/23/15 1048)     Initial Impression / Assessment and Plan / ED Course  I have reviewed the triage vital signs and the nursing notes.  Pertinent labs & imaging results that were available during my care of the patient were reviewed by me and considered in my medical decision making (see chart for details).  Clinical Course    I personally performed the services described in this documentation, which was scribed in my presence. The recorded information has been reviewed and is accurate.  CT, and x-ray findings discussed with patient. She is neurologically intact and without symptoms. I discussed with her that she'll course of compression fracture. She is appropriate for discharge home. Asked her to rest and avoid upright positions and activities until her  symptoms improve. If she is not getting adequate symptom control at home if her pain persists without improvement for the next week I have directed her cords or primary care physician, or  neurosurgical referral to discuss possibility of kyphoplasty. Patient expresses understanding. We discussed staying hydrated and using Colace to avoid constipation secondary to narcotics.  Final Clinical Impressions(s) / ED Diagnoses   Final diagnoses:  Compression fracture  Lumbar compression fracture, closed, initial encounter (HCC)    New Prescriptions New Prescriptions   DOCUSATE SODIUM (COLACE) 100 MG CAPSULE    Take 1 capsule (100 mg total) by mouth every 12 (twelve) hours.   ONDANSETRON (ZOFRAN ODT) 4 MG DISINTEGRATING TABLET    Take 1 tablet (4 mg total) by mouth every 8 (eight) hours as needed for nausea.   OXYCODONE-ACETAMINOPHEN (PERCOCET/ROXICET) 5-325 MG TABLET    Take 1-2 tablets by mouth every 4 (four) hours as needed.     Tanna Furry, MD 09/23/15 1336

## 2015-09-28 DIAGNOSIS — M810 Age-related osteoporosis without current pathological fracture: Secondary | ICD-10-CM | POA: Diagnosis not present

## 2015-09-28 DIAGNOSIS — M8000XD Age-related osteoporosis with current pathological fracture, unspecified site, subsequent encounter for fracture with routine healing: Secondary | ICD-10-CM | POA: Diagnosis not present

## 2015-09-28 DIAGNOSIS — R5383 Other fatigue: Secondary | ICD-10-CM | POA: Diagnosis not present

## 2015-09-28 DIAGNOSIS — S32010D Wedge compression fracture of first lumbar vertebra, subsequent encounter for fracture with routine healing: Secondary | ICD-10-CM | POA: Diagnosis not present

## 2015-09-28 DIAGNOSIS — S32019A Unspecified fracture of first lumbar vertebra, initial encounter for closed fracture: Secondary | ICD-10-CM | POA: Diagnosis not present

## 2015-09-28 DIAGNOSIS — E559 Vitamin D deficiency, unspecified: Secondary | ICD-10-CM | POA: Diagnosis not present

## 2015-09-29 ENCOUNTER — Other Ambulatory Visit (HOSPITAL_COMMUNITY): Payer: Self-pay | Admitting: Sports Medicine

## 2015-09-29 DIAGNOSIS — M8000XA Age-related osteoporosis with current pathological fracture, unspecified site, initial encounter for fracture: Secondary | ICD-10-CM

## 2015-09-29 DIAGNOSIS — S32010A Wedge compression fracture of first lumbar vertebra, initial encounter for closed fracture: Secondary | ICD-10-CM

## 2015-10-05 ENCOUNTER — Other Ambulatory Visit (HOSPITAL_COMMUNITY): Payer: Self-pay | Admitting: Interventional Radiology

## 2015-10-05 DIAGNOSIS — M545 Low back pain, unspecified: Secondary | ICD-10-CM

## 2015-10-05 DIAGNOSIS — IMO0002 Reserved for concepts with insufficient information to code with codable children: Secondary | ICD-10-CM

## 2015-10-15 ENCOUNTER — Ambulatory Visit (HOSPITAL_COMMUNITY)
Admission: RE | Admit: 2015-10-15 | Discharge: 2015-10-15 | Disposition: A | Discharge status: Home / Self Care | Payer: Medicare Other | Source: Ambulatory Visit | Attending: Interventional Radiology | Admitting: Interventional Radiology

## 2015-10-15 ENCOUNTER — Other Ambulatory Visit (HOSPITAL_COMMUNITY): Payer: Self-pay | Admitting: Interventional Radiology

## 2015-10-15 DIAGNOSIS — M545 Low back pain, unspecified: Secondary | ICD-10-CM

## 2015-10-15 DIAGNOSIS — IMO0002 Reserved for concepts with insufficient information to code with codable children: Secondary | ICD-10-CM

## 2015-10-15 DIAGNOSIS — M549 Dorsalgia, unspecified: Secondary | ICD-10-CM

## 2015-10-15 HISTORY — PX: IR GENERIC HISTORICAL: IMG1180011

## 2015-10-16 ENCOUNTER — Other Ambulatory Visit: Payer: Self-pay | Admitting: Radiology

## 2015-10-20 ENCOUNTER — Ambulatory Visit (HOSPITAL_COMMUNITY)
Admission: RE | Admit: 2015-10-20 | Discharge: 2015-10-20 | Disposition: A | Discharge status: Home / Self Care | Payer: Medicare Other | Source: Ambulatory Visit | Attending: Interventional Radiology | Admitting: Interventional Radiology

## 2015-10-20 ENCOUNTER — Encounter (HOSPITAL_COMMUNITY): Payer: Self-pay

## 2015-10-20 DIAGNOSIS — Z7982 Long term (current) use of aspirin: Secondary | ICD-10-CM | POA: Insufficient documentation

## 2015-10-20 DIAGNOSIS — Y92009 Unspecified place in unspecified non-institutional (private) residence as the place of occurrence of the external cause: Secondary | ICD-10-CM | POA: Diagnosis not present

## 2015-10-20 DIAGNOSIS — Z86010 Personal history of colonic polyps: Secondary | ICD-10-CM | POA: Insufficient documentation

## 2015-10-20 DIAGNOSIS — M1990 Unspecified osteoarthritis, unspecified site: Secondary | ICD-10-CM | POA: Insufficient documentation

## 2015-10-20 DIAGNOSIS — M549 Dorsalgia, unspecified: Secondary | ICD-10-CM

## 2015-10-20 DIAGNOSIS — F329 Major depressive disorder, single episode, unspecified: Secondary | ICD-10-CM | POA: Insufficient documentation

## 2015-10-20 DIAGNOSIS — M5137 Other intervertebral disc degeneration, lumbosacral region: Secondary | ICD-10-CM | POA: Diagnosis not present

## 2015-10-20 DIAGNOSIS — S32019A Unspecified fracture of first lumbar vertebra, initial encounter for closed fracture: Secondary | ICD-10-CM | POA: Diagnosis not present

## 2015-10-20 DIAGNOSIS — E785 Hyperlipidemia, unspecified: Secondary | ICD-10-CM | POA: Insufficient documentation

## 2015-10-20 DIAGNOSIS — Z96641 Presence of right artificial hip joint: Secondary | ICD-10-CM | POA: Diagnosis not present

## 2015-10-20 DIAGNOSIS — Z800 Family history of malignant neoplasm of digestive organs: Secondary | ICD-10-CM | POA: Insufficient documentation

## 2015-10-20 DIAGNOSIS — I700 Atherosclerosis of aorta: Secondary | ICD-10-CM | POA: Insufficient documentation

## 2015-10-20 DIAGNOSIS — Z87891 Personal history of nicotine dependence: Secondary | ICD-10-CM | POA: Diagnosis not present

## 2015-10-20 DIAGNOSIS — W19XXXA Unspecified fall, initial encounter: Secondary | ICD-10-CM | POA: Insufficient documentation

## 2015-10-20 DIAGNOSIS — I110 Hypertensive heart disease with heart failure: Secondary | ICD-10-CM | POA: Insufficient documentation

## 2015-10-20 DIAGNOSIS — IMO0002 Reserved for concepts with insufficient information to code with codable children: Secondary | ICD-10-CM

## 2015-10-20 DIAGNOSIS — G43909 Migraine, unspecified, not intractable, without status migrainosus: Secondary | ICD-10-CM | POA: Insufficient documentation

## 2015-10-20 DIAGNOSIS — Z8249 Family history of ischemic heart disease and other diseases of the circulatory system: Secondary | ICD-10-CM | POA: Insufficient documentation

## 2015-10-20 DIAGNOSIS — M4856XA Collapsed vertebra, not elsewhere classified, lumbar region, initial encounter for fracture: Secondary | ICD-10-CM | POA: Diagnosis present

## 2015-10-20 DIAGNOSIS — I509 Heart failure, unspecified: Secondary | ICD-10-CM | POA: Insufficient documentation

## 2015-10-20 DIAGNOSIS — F419 Anxiety disorder, unspecified: Secondary | ICD-10-CM | POA: Diagnosis not present

## 2015-10-20 HISTORY — PX: IR GENERIC HISTORICAL: IMG1180011

## 2015-10-20 LAB — BASIC METABOLIC PANEL
Anion gap: 8 (ref 5–15)
BUN: 24 mg/dL — ABNORMAL HIGH (ref 6–20)
CALCIUM: 9.8 mg/dL (ref 8.9–10.3)
CO2: 28 mmol/L (ref 22–32)
CREATININE: 0.91 mg/dL (ref 0.44–1.00)
Chloride: 104 mmol/L (ref 101–111)
GFR calc non Af Amer: 59 mL/min — ABNORMAL LOW (ref >60)
Glucose, Bld: 93 mg/dL (ref 65–99)
Potassium: 3.8 mmol/L (ref 3.5–5.1)
SODIUM: 140 mmol/L (ref 135–145)

## 2015-10-20 LAB — CBC
HCT: 35.2 % — ABNORMAL LOW (ref 36.0–46.0)
Hemoglobin: 10.7 g/dL — ABNORMAL LOW (ref 12.0–15.0)
MCH: 30 pg (ref 26.0–34.0)
MCHC: 30.4 g/dL (ref 30.0–36.0)
MCV: 98.6 fL (ref 78.0–100.0)
PLATELETS: 344 10*3/uL (ref 150–400)
RBC: 3.57 MIL/uL — AB (ref 3.87–5.11)
RDW: 13.6 % (ref 11.5–15.5)
WBC: 8.6 10*3/uL (ref 4.0–10.5)

## 2015-10-20 LAB — PROTIME-INR
INR: 1
PROTHROMBIN TIME: 13.2 s (ref 11.4–15.2)

## 2015-10-20 LAB — APTT: aPTT: 27 seconds (ref 24–36)

## 2015-10-20 MED ORDER — IOPAMIDOL (ISOVUE-300) INJECTION 61%
INTRAVENOUS | Status: AC
  Administered 2015-10-20: 5 mL
  Filled 2015-10-20: qty 50

## 2015-10-20 MED ORDER — HYDROMORPHONE HCL 1 MG/ML IJ SOLN
INTRAMUSCULAR | Status: AC
  Filled 2015-10-20: qty 1

## 2015-10-20 MED ORDER — BUPIVACAINE HCL (PF) 0.5 % IJ SOLN
INTRAMUSCULAR | Status: AC | PRN
Start: 2015-10-20 — End: 2015-10-20
  Administered 2015-10-20: 15 mL

## 2015-10-20 MED ORDER — HYDROMORPHONE HCL 1 MG/ML IJ SOLN
INTRAMUSCULAR | Status: AC | PRN
  Administered 2015-10-20: 1 mg via INTRAVENOUS

## 2015-10-20 MED ORDER — SODIUM CHLORIDE 0.9 % IV SOLN
INTRAVENOUS | Status: AC
  Administered 2015-10-20: 10:00:00 via INTRAVENOUS

## 2015-10-20 MED ORDER — CEFAZOLIN SODIUM-DEXTROSE 2-4 GM/100ML-% IV SOLN: 2.0000 g | INTRAVENOUS | Status: DC

## 2015-10-20 MED ORDER — TOBRAMYCIN SULFATE 1.2 G IJ SOLR
INTRAMUSCULAR | Status: AC
  Administered 2015-10-20: 0.1 g
  Filled 2015-10-20: qty 1.2

## 2015-10-20 MED ORDER — SODIUM CHLORIDE 0.9 % IV SOLN: INTRAVENOUS | Status: DC

## 2015-10-20 MED ORDER — BUPIVACAINE HCL (PF) 0.25 % IJ SOLN
INTRAMUSCULAR | Status: AC
  Filled 2015-10-20: qty 30

## 2015-10-20 MED ORDER — MIDAZOLAM HCL 2 MG/2ML IJ SOLN
INTRAMUSCULAR | Status: AC
  Filled 2015-10-20: qty 4

## 2015-10-20 MED ORDER — FENTANYL CITRATE (PF) 100 MCG/2ML IJ SOLN
INTRAMUSCULAR | Status: AC | PRN
  Administered 2015-10-20 (×4): 25 ug via INTRAVENOUS

## 2015-10-20 MED ORDER — MIDAZOLAM HCL 2 MG/2ML IJ SOLN
INTRAMUSCULAR | Status: AC | PRN
  Administered 2015-10-20 (×3): 1 mg via INTRAVENOUS

## 2015-10-20 MED ORDER — CEFAZOLIN SODIUM-DEXTROSE 2-4 GM/100ML-% IV SOLN
INTRAVENOUS | Status: AC
  Administered 2015-10-20: 2000 mg
  Filled 2015-10-20: qty 100

## 2015-10-20 MED ORDER — FENTANYL CITRATE (PF) 100 MCG/2ML IJ SOLN
INTRAMUSCULAR | Status: AC
  Filled 2015-10-20: qty 4

## 2015-10-20 NOTE — H&P (Signed)
Chief Complaint: Patient was seen in consultation today for Lumbar 1 verterbroplasty/kyphoplasty at the request of Dr Asencion Noble  Referring Physician(s): Dr Asencion Noble  Supervising Physician: Luanne Bras  Patient Status: Outpatient  History of Present Illness: Mackenzie [name] is a 75 y.o. female   [address] at home 09/22/2015 Has had Rt hip weakness after hip replacement 1 yr ago Pain was excruciating CT 8/9: IMPRESSION: Compression fracture L1 vertebral body involving right aspect greater than left. 50% loss of height on the right and 30% loss height on the left. Minimal retropulsion posterior superior aspect. No other acute fracture noted. Degenerative changes as detailed above. Prominent aortic calcification and calcifications branch vessels. Focal bulge of the abdominal aorta at the L3 level with transverse dimension of 2.3 cm whereas just above this level the aorta measures 1.7 cm  Request per PMD for evaluation and possible treatment of L1 fracture Was seen in consultation 8/31 Scheduled now for Lumbar 1 VP/KP   Past Medical History:  Diagnosis Date  . Anemia, normocytic normochromic 01/2012   hx of  . Anxiety    takes Xanax daily as needed  . Arthritis   . Cataract    right eye  . CHF (congestive heart failure) ([place])   . Depression    takes Citalopram daily  . Dizziness    cardiologist is aware and told pt it was related to meds  . History of bronchitis    2014  . History of colon polyps    benign  . History of migraine   . Hyperlipidemia    takes Pravastatin daily  . Hypertension    takes Metoprolol and Azor daily  . Joint pain   . Pneumonia    hx of-2015  . Rheumatic fever    hx of  . Wheezing on expiration     Past Surgical History:  Procedure Laterality Date  . ABDOMINAL HYSTERECTOMY    . APPENDECTOMY    . CARDIAC CATHETERIZATION     early 2000's  . CARDIAC CATHETERIZATION N/A 03/04/2015   Procedure: Left Heart Cath and Coronary  Angiography;  Surgeon: Sherren Mocha, MD;  [facility];  Service: Cardiovascular;  Laterality: N/A;  . cataract surgery Left   . CHOLECYSTECTOMY    . COLONOSCOPY  2005   Negative screening study  . COLONOSCOPY N/A 04/04/2014   Procedure: COLONOSCOPY;  Surgeon: Rogene Houston, MD;  [facility];  Service: Endoscopy;  Laterality: N/A;  1225  . EXPLORATORY LAPAROTOMY     x 3  . TOTAL HIP ARTHROPLASTY Right 12/16/2014   Procedure: TOTAL HIP ARTHROPLASTY ANTERIOR APPROACH;  Surgeon: Renette Butters, MD;  Location: [facility];  Service: Orthopedics;  Laterality: Right;    Allergies: Statins  Medications: Prior to Admission medications   Medication Sig Start Date End Date Taking? Authorizing Provider  ALPRAZolam Duanne Moron) 0.5 MG tablet Take 0.5 mg by mouth 2 (two) times daily as needed. Anxiety/sleep aid   Yes Historical Provider, MD  amLODipine-olmesartan (AZOR) 5-40 MG per tablet Take 1 tablet by mouth at bedtime.    Yes Historical Provider, MD  aspirin 81 MG tablet Take 81 mg by mouth daily.   Yes Historical Provider, MD  citalopram (CELEXA) 10 MG tablet Take 10 mg by mouth daily.   Yes Historical Provider, MD  clobetasol cream (TEMOVATE) [id number] % Apply 1 application topically daily as needed. 02/17/15  Yes Historical Provider, MD  Cyanocobalamin (VITAMIN B-12 IJ) Inject 1 mL  as directed every 30 (thirty) days.   Yes Historical Provider, MD  docusate sodium (COLACE) 100 MG capsule Take 1 capsule (100 mg total) by mouth every 12 (twelve) hours. 09/23/15  Yes Tanna Furry, MD  metoprolol succinate (TOPROL-XL) 25 MG 24 hr tablet Take 25 mg by mouth daily.     Yes Historical Provider, MD  oxyCODONE-acetaminophen (PERCOCET/ROXICET) 5-325 MG tablet Take 1-2 tablets by mouth every 4 (four) hours as needed. 09/23/15  Yes Tanna Furry, MD  pravastatin (PRAVACHOL) 40 MG tablet Take 40 mg by mouth 3 (three) times a week.    Yes Historical Provider, MD  triamcinolone cream (KENALOG) 0.1 %  Apply 1 application topically daily as needed. 02/27/15  Yes Historical Provider, MD  omeprazole (PRILOSEC) 40 MG capsule Take 1 capsule (40 mg total) by mouth daily. 03/05/15   Nita Sells, MD  ondansetron (ZOFRAN ODT) 4 MG disintegrating tablet Take 1 tablet (4 mg total) by mouth every 8 (eight) hours as needed for nausea. 09/23/15   Tanna Furry, MD     Family History  Problem Relation Age of Onset  . Heart attack Mother 66    heart problems  . Cancer Mother     uterus  . Pancreatitis Sister     died  age 45  . Colon cancer Maternal Aunt 81  . Colon cancer Maternal Aunt 78  . Colon cancer Maternal Aunt 76  . Cancer      FH  . Diabetes      FH  . Hypertension      FH  . Diabetes Son   . Diabetes Daughter   . Colon cancer Maternal Uncle 64    Social History   Social History  . Marital status: Widowed    Spouse name: N/A  . Number of children: N/A  . Years of education: N/A   Social History Main Topics  . Smoking status: Former Smoker    Packs/day: 1.00    Years: 15.00  . Smokeless tobacco: Never Used     Comment: quit smoking 30+yrs ago  . Alcohol use Yes     Comment: socially wine  . Drug use: No  . Sexual activity: Not Asked   Other Topics Concern  . None   Social History Narrative  . None     Review of Systems: A 12 point ROS discussed and pertinent positives are indicated in the HPI above.  All other systems are negative.  Review of Systems  Constitutional: Positive for activity change. Negative for appetite change, fatigue and fever.  Respiratory: Negative for shortness of breath.   Cardiovascular: Negative for chest pain.  Gastrointestinal: Negative for abdominal pain and nausea.  Musculoskeletal: Positive for back pain.  Neurological: Negative for weakness.  Psychiatric/Behavioral: Negative for behavioral problems and confusion.    Vital Signs: BP (!) 149/53   Pulse 61   Temp 98.1 F (36.7 C) (Oral)   Resp 16   SpO2 99%   Physical  Exam  Constitutional: She appears well-nourished.  Cardiovascular: Normal rate, regular rhythm and normal heart sounds.   Pulmonary/Chest: Effort normal and breath sounds normal.  Abdominal: Soft. Bowel sounds are normal.  Musculoskeletal: Normal range of motion.  Low back pain  Neurological: She is alert.  Skin: Skin is warm and dry.  Psychiatric: She has a normal mood and affect. Her behavior is normal. Judgment and thought content normal.  Nursing note and vitals reviewed.   Mallampati Score:  MD Evaluation Airway: WNL Heart: WNL  Abdomen: WNL Chest/ Lungs: WNL ASA  Classification: 2 Mallampati/Airway Score: One  Imaging: Dg Lumbar Spine Complete  Result Date: 09/23/2015 CLINICAL DATA:  Fall with low back pain.  Initial encounter. EXAM: LUMBAR SPINE - COMPLETE 4+ VIEW COMPARISON:  None. FINDINGS: Mild compression fracture of the L1 vertebral body noted with approximately 25-30% loss of anterior vertebral body height. Based on radiographic appearance, this is likely an acute/subacute compression fracture. No subluxation identified. Mild degenerative disc disease present at L2-3, L4-5 and L5-S1 without significant disc space narrowing. No bony lesions identified. The abdominal aorta is heavily calcified. IMPRESSION: 1. Mild L1 compression fracture with approximately 25-30% loss of anterior vertebral body height. This appears relatively acute/subacute in appearance radiographically. 2. Aortic atherosclerosis with heavily calcified abdominal aorta. Electronically Signed   By: Aletta Edouard M.D.   On: 09/23/2015 11:28   Ct Lumbar Spine Wo Contrast  Result Date: 09/23/2015 CLINICAL DATA:  45 female post fall with lower back pain. Subsequent encounter. EXAM: CT LUMBAR SPINE WITHOUT CONTRAST TECHNIQUE: Multidetector CT imaging of the lumbar spine was performed without intravenous contrast administration. Multiplanar CT image reconstructions were also generated. COMPARISON:   09/23/2015 plain film exam. FINDINGS: Last fully open disk space is labeled L5-S1. Present examination incorporates from T12 through S3. Compression fracture L1 vertebral body involving right aspect greater than left. 50% loss of height on the right and 30% loss height on the left. Minimal retropulsion posterior superior aspect. No other acute fracture noted. Remote small Schmorl's node deformity superior endplate L3. QA348G: Minimal bulge greater to left. L1-2: Mild bulge. L2-3: Bulge greater to left with slight encroachment upon exiting left L3 nerve root. Facet degenerative changes. Mild spinal stenosis and right greater than left lateral recess stenosis. L3-4:  Minimal bulge.  Mild facet degenerative changes. L4-5: Mild bulge. Mild facet degenerative changes. Very mild spinal stenosis. L5-S1:  No significant spinal stenosis or foraminal narrowing. Prominent aortic calcification and calcifications branch vessels. Focal bulge of the abdominal aorta at the L3 level with transverse dimension of 2.3 cm whereas just above this level the aorta measures 1.7 cm. IMPRESSION: Compression fracture L1 vertebral body involving right aspect greater than left. 50% loss of height on the right and 30% loss height on the left. Minimal retropulsion posterior superior aspect. No other acute fracture noted. Degenerative changes as detailed above. Prominent aortic calcification and calcifications branch vessels. Focal bulge of the abdominal aorta at the L3 level with transverse dimension of 2.3 cm whereas just above this level the aorta measures 1.7 cm. Electronically Signed   By: Genia Del M.D.   On: 09/23/2015 13:03   Dg Hip Unilat W Or Wo Pelvis 2-3 Views Left  Result Date: 09/23/2015 CLINICAL DATA:  Fall with left hip pain. Initial encounter. History of prior right hip arthroplasty. EXAM: DG HIP (WITH OR WITHOUT PELVIS) 2-3V LEFT COMPARISON:  Intraoperative right hip films on 12/16/2014. FINDINGS: No acute fracture or  dislocation is identified. The bony pelvis appears intact without evidence of fracture or diastasis. Visualized portions of the right hip arthroplasty appear unremarkable. IMPRESSION: No acute fracture or dislocation identified. Electronically Signed   By: Aletta Edouard M.D.   On: 09/23/2015 11:30    Labs:  CBC:  Recent Labs  12/04/14 1128 12/16/14 0921 12/17/14 0606 03/04/15 0420 10/20/15 0655  WBC 7.4  --  8.5 6.9 8.6  HGB 11.8* 7.8* 9.4* 10.3* 10.7*  HCT 36.7 23.0* 28.8* 32.8* 35.2*  PLT 311  --  252 315 344  COAGS:  Recent Labs  12/04/14 1128 10/20/15 0655  INR 1.02 1.00  APTT  --  27    BMP:  Recent Labs  12/04/14 1128 12/16/14 0921 03/04/15 0420 10/20/15 0655  NA 140 141 141 140  K 4.9 4.4 4.2 3.8  CL 106  --  105 104  CO2 28  --  29 28  GLUCOSE 107* 110* 109* 93  BUN 18  --  31* 24*  CALCIUM 9.9  --  9.4 9.8  CREATININE 0.77  --  0.86 0.91  GFRNONAA >60  --  >60 59*  GFRAA >60  --  >60 >60    LIVER FUNCTION TESTS:  Recent Labs  03/04/15 0420  BILITOT 0.4  AST 18  ALT 12*  ALKPHOS 104  PROT 7.1  ALBUMIN 3.7    TUMOR MARKERS: No results for input(s): AFPTM, CEA, CA199, CHROMGRNA in the last 8760 hours.  Assessment and Plan:  [address] at home 09/22/15 Noted Lumbar 1 acute fracture 8/9 on CT Now scheduled for Verteroplasty/kyphoplasty Risks and Benefits discussed with the patient including, but not limited to education regarding the natural healing process of compression fractures without intervention, bleeding, infection, cement migration which may cause spinal cord damage, paralysis, pulmonary embolism or even death. All of the patient's questions were answered, patient is agreeable to proceed. Consent signed and in chart.   Thank you for this interesting consult.  I greatly enjoyed meeting TIFFNAY SAXENA and look forward to participating in their care.  A copy of this report was sent to the requesting provider on this  date.  Electronically Signed: Elias Bordner A 10/20/2015, 7:57 AM   I spent a total of  30 Minutes   in face to face in clinical consultation, greater than 50% of which was counseling/coordinating care for Lumbar 1 VP/KP

## 2015-10-20 NOTE — Discharge Instructions (Signed)
1.No stooping,bending or lifting more than 10 lbs for 2 weeks. 2.Use walker to ambulate for 2 weeks. 3.RTC in 2 weeks   KYPHOPLASTY/VERTEBROPLASTY DISCHARGE INSTRUCTIONS  Medications: (check all that apply)     Resume all home medications as before procedure.       Resume your aspirin.                  Continue your pain medications as prescribed as needed.  Over the next 3-5 days, decrease your pain medication as tolerated.  Over the counter medications (i.e. Tylenol, ibuprofen, and aleve) may be substituted once severe/moderate pain symptoms have subsided.   Wound Care: - Bandages may be removed the day following your procedure.  You may get your incision wet once bandages are removed.  Bandaids may be used to cover the incisions until scab formation.  Topical ointments are optional.  - If you develop a fever greater than 101 degrees, have increased skin redness at the incision sites or pus-like oozing from incisions occurring within 1 week of the procedure, contact radiology at [phone] or [phone].  - Ice pack to back for 15-20 minutes 2-3 time per day for first 2-3 days post procedure.  The ice will expedite muscle healing and help with the pain from the incisions.   Activity: - Bedrest today with limited activity for 24 hours post procedure.  - No driving for 48 hours.  - Increase your activity as tolerated after bedrest (with assistance if necessary).  - Refrain from any strenuous activity or heavy lifting (greater than 10 lbs.).   Follow up: - Contact radiology at [phone] or [phone] if any questions/concerns.  - A physician assistant from radiology will contact you in approximately 1 week.  - If a biopsy was performed at the time of your procedure, your referring physician should receive the results in usually 2-3 days.

## 2015-10-20 NOTE — Procedures (Signed)
S/P L1 Balloon KP  

## 2015-10-21 ENCOUNTER — Other Ambulatory Visit (HOSPITAL_COMMUNITY): Payer: Medicare Other

## 2015-10-21 ENCOUNTER — Encounter (HOSPITAL_COMMUNITY): Payer: Self-pay | Admitting: Interventional Radiology

## 2015-10-22 ENCOUNTER — Telehealth (HOSPITAL_COMMUNITY): Payer: Self-pay

## 2015-10-22 NOTE — Telephone Encounter (Signed)
Called to schedule f/u, left message for pt to return call. AW 

## 2015-10-26 ENCOUNTER — Other Ambulatory Visit (HOSPITAL_COMMUNITY): Payer: Medicare Other

## 2015-10-27 ENCOUNTER — Ambulatory Visit (HOSPITAL_COMMUNITY)
Admission: RE | Admit: 2015-10-27 | Discharge: 2015-10-27 | Disposition: A | Discharge status: Home / Self Care | Payer: Medicare Other | Source: Ambulatory Visit | Attending: Sports Medicine | Admitting: Sports Medicine

## 2015-10-27 DIAGNOSIS — M8000XA Age-related osteoporosis with current pathological fracture, unspecified site, initial encounter for fracture: Secondary | ICD-10-CM | POA: Insufficient documentation

## 2015-10-27 DIAGNOSIS — S32010A Wedge compression fracture of first lumbar vertebra, initial encounter for closed fracture: Secondary | ICD-10-CM | POA: Insufficient documentation

## 2015-10-27 DIAGNOSIS — X58XXXA Exposure to other specified factors, initial encounter: Secondary | ICD-10-CM | POA: Insufficient documentation

## 2015-10-27 DIAGNOSIS — M85852 Other specified disorders of bone density and structure, left thigh: Secondary | ICD-10-CM | POA: Diagnosis not present

## 2015-10-27 DIAGNOSIS — M85832 Other specified disorders of bone density and structure, left forearm: Secondary | ICD-10-CM | POA: Diagnosis not present

## 2015-10-27 DIAGNOSIS — M8588 Other specified disorders of bone density and structure, other site: Secondary | ICD-10-CM | POA: Diagnosis not present

## 2015-10-27 DIAGNOSIS — Z780 Asymptomatic menopausal state: Secondary | ICD-10-CM | POA: Insufficient documentation

## 2015-10-28 ENCOUNTER — Other Ambulatory Visit (HOSPITAL_COMMUNITY): Payer: Medicare Other

## 2015-11-02 DIAGNOSIS — S32010D Wedge compression fracture of first lumbar vertebra, subsequent encounter for fracture with routine healing: Secondary | ICD-10-CM | POA: Diagnosis not present

## 2015-11-02 DIAGNOSIS — M810 Age-related osteoporosis without current pathological fracture: Secondary | ICD-10-CM | POA: Diagnosis not present

## 2015-12-01 DIAGNOSIS — M1711 Unilateral primary osteoarthritis, right knee: Secondary | ICD-10-CM | POA: Diagnosis not present

## 2015-12-07 DIAGNOSIS — I10 Essential (primary) hypertension: Secondary | ICD-10-CM | POA: Diagnosis not present

## 2015-12-07 DIAGNOSIS — R531 Weakness: Secondary | ICD-10-CM | POA: Diagnosis not present

## 2015-12-07 DIAGNOSIS — D649 Anemia, unspecified: Secondary | ICD-10-CM | POA: Diagnosis not present

## 2015-12-21 DIAGNOSIS — Z23 Encounter for immunization: Secondary | ICD-10-CM | POA: Diagnosis not present

## 2015-12-22 DIAGNOSIS — H524 Presbyopia: Secondary | ICD-10-CM | POA: Diagnosis not present

## 2015-12-22 DIAGNOSIS — H5212 Myopia, left eye: Secondary | ICD-10-CM | POA: Diagnosis not present

## 2015-12-22 DIAGNOSIS — H35372 Puckering of macula, left eye: Secondary | ICD-10-CM | POA: Diagnosis not present

## 2015-12-22 DIAGNOSIS — H52202 Unspecified astigmatism, left eye: Secondary | ICD-10-CM | POA: Diagnosis not present

## 2015-12-22 DIAGNOSIS — H5201 Hypermetropia, right eye: Secondary | ICD-10-CM | POA: Diagnosis not present

## 2015-12-22 DIAGNOSIS — H2511 Age-related nuclear cataract, right eye: Secondary | ICD-10-CM | POA: Diagnosis not present

## 2015-12-22 DIAGNOSIS — H52201 Unspecified astigmatism, right eye: Secondary | ICD-10-CM | POA: Diagnosis not present

## 2015-12-22 DIAGNOSIS — Z961 Presence of intraocular lens: Secondary | ICD-10-CM | POA: Diagnosis not present

## 2015-12-30 DIAGNOSIS — L308 Other specified dermatitis: Secondary | ICD-10-CM | POA: Diagnosis not present

## 2015-12-30 DIAGNOSIS — L508 Other urticaria: Secondary | ICD-10-CM | POA: Diagnosis not present

## 2016-01-04 DIAGNOSIS — L2389 Allergic contact dermatitis due to other agents: Secondary | ICD-10-CM | POA: Diagnosis not present

## 2016-01-04 DIAGNOSIS — L508 Other urticaria: Secondary | ICD-10-CM | POA: Diagnosis not present

## 2016-01-21 DIAGNOSIS — Z79899 Other long term (current) drug therapy: Secondary | ICD-10-CM | POA: Diagnosis not present

## 2016-01-21 DIAGNOSIS — M810 Age-related osteoporosis without current pathological fracture: Secondary | ICD-10-CM | POA: Diagnosis not present

## 2016-01-21 DIAGNOSIS — I10 Essential (primary) hypertension: Secondary | ICD-10-CM | POA: Diagnosis not present

## 2016-01-21 DIAGNOSIS — D649 Anemia, unspecified: Secondary | ICD-10-CM | POA: Diagnosis not present

## 2016-01-21 DIAGNOSIS — D500 Iron deficiency anemia secondary to blood loss (chronic): Secondary | ICD-10-CM | POA: Diagnosis not present

## 2016-01-25 DIAGNOSIS — S8000XD Contusion of unspecified knee, subsequent encounter: Secondary | ICD-10-CM | POA: Diagnosis not present

## 2016-01-25 DIAGNOSIS — M5441 Lumbago with sciatica, right side: Secondary | ICD-10-CM | POA: Diagnosis not present

## 2016-01-26 ENCOUNTER — Other Ambulatory Visit (HOSPITAL_COMMUNITY): Payer: Self-pay | Admitting: Sports Medicine

## 2016-01-26 DIAGNOSIS — S32010D Wedge compression fracture of first lumbar vertebra, subsequent encounter for fracture with routine healing: Secondary | ICD-10-CM

## 2016-01-27 DIAGNOSIS — L509 Urticaria, unspecified: Secondary | ICD-10-CM | POA: Diagnosis not present

## 2016-02-01 ENCOUNTER — Ambulatory Visit (HOSPITAL_COMMUNITY)
Admission: RE | Admit: 2016-02-01 | Discharge: 2016-02-01 | Disposition: A | Discharge status: Home / Self Care | Payer: Medicare Other | Source: Ambulatory Visit | Attending: Sports Medicine | Admitting: Sports Medicine

## 2016-02-01 DIAGNOSIS — M545 Low back pain: Secondary | ICD-10-CM | POA: Diagnosis not present

## 2016-02-01 DIAGNOSIS — S32010D Wedge compression fracture of first lumbar vertebra, subsequent encounter for fracture with routine healing: Secondary | ICD-10-CM | POA: Diagnosis not present

## 2016-02-01 DIAGNOSIS — X58XXXD Exposure to other specified factors, subsequent encounter: Secondary | ICD-10-CM | POA: Diagnosis not present

## 2016-02-01 DIAGNOSIS — M8000XD Age-related osteoporosis with current pathological fracture, unspecified site, subsequent encounter for fracture with routine healing: Secondary | ICD-10-CM | POA: Diagnosis not present

## 2016-02-01 DIAGNOSIS — M5441 Lumbago with sciatica, right side: Secondary | ICD-10-CM | POA: Diagnosis not present

## 2016-03-04 DIAGNOSIS — Z6827 Body mass index (BMI) 27.0-27.9, adult: Secondary | ICD-10-CM | POA: Diagnosis not present

## 2016-03-04 DIAGNOSIS — I10 Essential (primary) hypertension: Secondary | ICD-10-CM | POA: Diagnosis not present

## 2016-03-04 DIAGNOSIS — M8008XD Age-related osteoporosis with current pathological fracture, vertebra(e), subsequent encounter for fracture with routine healing: Secondary | ICD-10-CM | POA: Diagnosis not present

## 2016-03-17 DIAGNOSIS — H524 Presbyopia: Secondary | ICD-10-CM | POA: Diagnosis not present

## 2016-03-17 DIAGNOSIS — Z961 Presence of intraocular lens: Secondary | ICD-10-CM | POA: Diagnosis not present

## 2016-03-17 DIAGNOSIS — H2511 Age-related nuclear cataract, right eye: Secondary | ICD-10-CM | POA: Diagnosis not present

## 2016-03-17 DIAGNOSIS — H35372 Puckering of macula, left eye: Secondary | ICD-10-CM | POA: Diagnosis not present

## 2016-03-18 ENCOUNTER — Encounter: Payer: Self-pay | Admitting: Internal Medicine

## 2016-03-23 DIAGNOSIS — H2511 Age-related nuclear cataract, right eye: Secondary | ICD-10-CM | POA: Diagnosis not present

## 2016-03-30 DIAGNOSIS — H2511 Age-related nuclear cataract, right eye: Secondary | ICD-10-CM | POA: Diagnosis not present

## 2016-04-05 DIAGNOSIS — M25551 Pain in right hip: Secondary | ICD-10-CM | POA: Diagnosis not present

## 2016-04-05 DIAGNOSIS — M1711 Unilateral primary osteoarthritis, right knee: Secondary | ICD-10-CM | POA: Diagnosis not present

## 2016-05-03 DIAGNOSIS — M1711 Unilateral primary osteoarthritis, right knee: Secondary | ICD-10-CM | POA: Diagnosis not present

## 2016-06-06 DIAGNOSIS — I10 Essential (primary) hypertension: Secondary | ICD-10-CM | POA: Diagnosis not present

## 2016-06-06 DIAGNOSIS — M818 Other osteoporosis without current pathological fracture: Secondary | ICD-10-CM | POA: Diagnosis not present

## 2016-08-23 DIAGNOSIS — Z961 Presence of intraocular lens: Secondary | ICD-10-CM | POA: Diagnosis not present

## 2016-09-06 DIAGNOSIS — I10 Essential (primary) hypertension: Secondary | ICD-10-CM | POA: Diagnosis not present

## 2016-09-06 DIAGNOSIS — F419 Anxiety disorder, unspecified: Secondary | ICD-10-CM | POA: Diagnosis not present

## 2016-12-06 DIAGNOSIS — M10071 Idiopathic gout, right ankle and foot: Secondary | ICD-10-CM | POA: Diagnosis not present

## 2016-12-06 DIAGNOSIS — F419 Anxiety disorder, unspecified: Secondary | ICD-10-CM | POA: Diagnosis not present

## 2016-12-06 DIAGNOSIS — E785 Hyperlipidemia, unspecified: Secondary | ICD-10-CM | POA: Diagnosis not present

## 2016-12-06 DIAGNOSIS — Z79899 Other long term (current) drug therapy: Secondary | ICD-10-CM | POA: Diagnosis not present

## 2016-12-06 DIAGNOSIS — I10 Essential (primary) hypertension: Secondary | ICD-10-CM | POA: Diagnosis not present

## 2016-12-13 DIAGNOSIS — Z23 Encounter for immunization: Secondary | ICD-10-CM | POA: Diagnosis not present

## 2016-12-20 ENCOUNTER — Ambulatory Visit (HOSPITAL_COMMUNITY)
Admission: RE | Admit: 2016-12-20 | Discharge: 2016-12-20 | Disposition: A | Discharge status: Home / Self Care | Payer: Medicare Other | Source: Ambulatory Visit | Attending: Internal Medicine | Admitting: Internal Medicine

## 2016-12-20 ENCOUNTER — Other Ambulatory Visit (HOSPITAL_COMMUNITY): Payer: Self-pay | Admitting: Internal Medicine

## 2016-12-20 DIAGNOSIS — M19071 Primary osteoarthritis, right ankle and foot: Secondary | ICD-10-CM | POA: Insufficient documentation

## 2016-12-20 DIAGNOSIS — M79671 Pain in right foot: Secondary | ICD-10-CM

## 2016-12-22 DIAGNOSIS — E785 Hyperlipidemia, unspecified: Secondary | ICD-10-CM | POA: Diagnosis not present

## 2016-12-22 DIAGNOSIS — M10071 Idiopathic gout, right ankle and foot: Secondary | ICD-10-CM | POA: Diagnosis not present

## 2016-12-22 DIAGNOSIS — I10 Essential (primary) hypertension: Secondary | ICD-10-CM | POA: Diagnosis not present

## 2017-01-10 DIAGNOSIS — M25571 Pain in right ankle and joints of right foot: Secondary | ICD-10-CM | POA: Diagnosis not present

## 2017-01-30 DIAGNOSIS — M1711 Unilateral primary osteoarthritis, right knee: Secondary | ICD-10-CM | POA: Diagnosis not present

## 2017-02-06 ENCOUNTER — Inpatient Hospital Stay (HOSPITAL_COMMUNITY)
Admission: AD | Admit: 2017-02-06 | Discharge: 2017-02-06 | DRG: 554 | Disposition: A | Discharge status: Home / Self Care | Payer: Medicare Other | Source: Ambulatory Visit | Attending: Student | Admitting: Student

## 2017-02-06 ENCOUNTER — Encounter (HOSPITAL_COMMUNITY): Payer: Self-pay | Admitting: Anesthesiology

## 2017-02-06 ENCOUNTER — Ambulatory Visit: Payer: Self-pay | Admitting: Physician Assistant

## 2017-02-06 ENCOUNTER — Inpatient Hospital Stay (HOSPITAL_COMMUNITY): Payer: Medicare Other

## 2017-02-06 ENCOUNTER — Other Ambulatory Visit: Payer: Self-pay | Admitting: Physician Assistant

## 2017-02-06 ENCOUNTER — Encounter (HOSPITAL_COMMUNITY)
Admission: AD | Disposition: A | Discharge status: Home / Self Care | Payer: Self-pay | Source: Ambulatory Visit | Attending: Student

## 2017-02-06 DIAGNOSIS — M1711 Unilateral primary osteoarthritis, right knee: Principal | ICD-10-CM | POA: Diagnosis present

## 2017-02-06 DIAGNOSIS — I10 Essential (primary) hypertension: Secondary | ICD-10-CM | POA: Diagnosis present

## 2017-02-06 DIAGNOSIS — M11261 Other chondrocalcinosis, right knee: Secondary | ICD-10-CM | POA: Diagnosis present

## 2017-02-06 DIAGNOSIS — F329 Major depressive disorder, single episode, unspecified: Secondary | ICD-10-CM | POA: Diagnosis present

## 2017-02-06 DIAGNOSIS — R21 Rash and other nonspecific skin eruption: Secondary | ICD-10-CM | POA: Diagnosis present

## 2017-02-06 DIAGNOSIS — M009 Pyogenic arthritis, unspecified: Secondary | ICD-10-CM | POA: Diagnosis not present

## 2017-02-06 DIAGNOSIS — M25461 Effusion, right knee: Secondary | ICD-10-CM | POA: Diagnosis not present

## 2017-02-06 DIAGNOSIS — M1990 Unspecified osteoarthritis, unspecified site: Secondary | ICD-10-CM

## 2017-02-06 DIAGNOSIS — E785 Hyperlipidemia, unspecified: Secondary | ICD-10-CM | POA: Diagnosis not present

## 2017-02-06 DIAGNOSIS — I059 Rheumatic mitral valve disease, unspecified: Secondary | ICD-10-CM | POA: Diagnosis not present

## 2017-02-06 DIAGNOSIS — D649 Anemia, unspecified: Secondary | ICD-10-CM | POA: Diagnosis not present

## 2017-02-06 LAB — TYPE AND SCREEN
ABO/RH(D): A POS
Antibody Screen: NEGATIVE

## 2017-02-06 LAB — SURGICAL PCR SCREEN
MRSA, PCR: NEGATIVE
STAPHYLOCOCCUS AUREUS: NEGATIVE

## 2017-02-06 SURGERY — IRRIGATION AND DEBRIDEMENT KNEE
Anesthesia: General | Laterality: Right

## 2017-02-06 MED ORDER — LACTATED RINGERS IV SOLN
INTRAVENOUS | Status: DC
  Administered 2017-02-06: 12:00:00 via INTRAVENOUS

## 2017-02-06 MED ORDER — ACETAMINOPHEN 650 MG RE SUPP: 650.0000 mg | Freq: Four times a day (QID) | RECTAL | Status: AC | PRN

## 2017-02-06 MED ORDER — OXYCODONE HCL 5 MG PO TABS: 5.0000 mg | ORAL_TABLET | ORAL | Status: DC | PRN

## 2017-02-06 MED ORDER — ACETAMINOPHEN 325 MG PO TABS: 650.0000 mg | ORAL_TABLET | Freq: Four times a day (QID) | ORAL | Status: AC | PRN

## 2017-02-06 MED ORDER — SODIUM CHLORIDE 0.9 % IV SOLN: INTRAVENOUS | Status: AC

## 2017-02-06 MED ORDER — CHLORHEXIDINE GLUCONATE 4 % EX LIQD: 60.0000 mL | Freq: Once | CUTANEOUS | Status: DC

## 2017-02-06 MED ORDER — OXYCODONE-ACETAMINOPHEN 5-325 MG PO TABS: 1.0000 | ORAL_TABLET | ORAL | Status: DC | PRN

## 2017-02-06 MED ORDER — ALPRAZOLAM 0.5 MG PO TABS
0.5000 mg | ORAL_TABLET | Freq: Two times a day (BID) | ORAL | Status: DC | PRN
  Administered 2017-02-06: 0.5 mg via ORAL
  Filled 2017-02-06: qty 1

## 2017-02-06 MED ORDER — ONDANSETRON 4 MG PO TBDP
4.0000 mg | ORAL_TABLET | Freq: Three times a day (TID) | ORAL | Status: DC | PRN
  Filled 2017-02-06: qty 1

## 2017-02-06 NOTE — H&P (Signed)
This is a patient who I was contacted by the [facility] practice as I was on group call for them.  She is an individual who developed acute onset knee pain and swelling approximately 1 week ago she had her knee aspirated per report she had a white count of 22,000 on her aspiration with positive pseudogout crystals.  A culture was obtained and it returned today stating that it was positive for Pantoea agglomerans.  I was informed that the patient was still symptomatic in her again with continued swelling.  As a result that I cannot evaluate the patient due to being in the operating room I recommended that she be added onto my schedule and directly admitted to my service due to the positive cultures from her aspiration.  Upon examination of the patient she was not symptomatic and she states that her pain has significantly improved.  She does not hurt her when she bends her knee.  It has not been red or swollen.  She is quite anxious about her diagnosis and is concerned about the surgery and what it means for the long run.  She has a long-standing history of arthritis as well as chondrocalcinosis in her knee.  She has had multiple injections.  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  She has a history of flares of her skin with what appears per report of zoster.  She has gone to see a dermatologist as well as being referred to a rheumatologist for this.  She has had flares in her knee before but nothing like that one that she had approximately 1 week ago.  However she is currently asymptomatic and she is back to her baseline.  On exam she is alert and awake and pleasant.  Her knee is completely benign.  There is no erythema no swelling there is no effusion on exam.  She has full knee range of motion with no pain whatsoever.  She has no tenderness to palpation.  She is neurovascularly intact and no skin lesions noted on any portion of her leg.  I evaluated her back and  other areas of her arms and legs without any noticeable skin lesions.  I obtained x-rays here in the hospital which shows significant arthritic changes with chondrocalcinosis of the menisci.  I discussed with the patient that after examining her she clinically does not have a septic arthritis.  I can understand the concern regarding the positive culture from her aspiration but however clinically there is no indication for a irrigation debridement.  I feel that I would be doing more harm to her knee then benefit.  She is completely afebrile and does not have any systemic signs of infection.  She does not have any local signs of infection either.  I discussed with her the next steps of treatment.  I offered her a aspiration to get another sample of joint fluid to make sure there was not an infection.  However I discussed with her the risks of the aspiration and the chance that I would not get a significant amount of fluid due to the fact that she is does not have an effusion on exam.  I also offered her close follow-up with either myself or Dr. Noemi Chapel.  She wishes to be discharged with close follow-up.  I discussed this in coordination with her son as well as family members at bedside.  She understands the signs and symptoms of an infection including erythema,  warmth, swelling and decreased range of motion and pain.  If she encounters any of these before her follow-up she should contact me immediately but at this point there is minimal benefit to performing a surgery.  Shona Needles, MD [facility] [phone] (phone)           [] Hide copied text  [] Hover for details   Mackenzie [name] is a 76 year old female who has a significant history of right knee pain. She continues to have difficulty with pain in her knee and foot. She has a history of pseudogout but she is also having difficulty with rashes on her back and shoulder.  NO KNOWN DRUG ALLERGIES  Medications:  Celexa, HCTZ,  Metoprolol, Amlodipine, Valsartan, Prednisone as needed, B-12 and aspirin. Past medical history: Hypertension, depression, psuedogout and rash. Family history: Hypertension. Social History: Nonsmoker.   EXAMINATION: Well-developed, well-nourished 76 year old female.   She is independently ambulatory with a moderate antalgic gait. The right knee has a 3+ effusion. Active range of motion of -5 to 105 degrees. Moderate pain. Moderate warmth. She has 2+ dorsalis pedis pulses and is neurovascularly intact. The left knee has a full range of motion with 1 + crepitus and 1+ synovitis. Neurovascularly intact distally.   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  She has an area on her backside which is healing rash which has scabbed over, she also has one on her shoulder. They are approximately 5 inches in diameter. She saw a dermatologist about it but she was not sure what it was.   IMPRESSION: Right knee joint infection History of pseudogout. Hypertension. Depression. Migratory rash.  PLAN: Will plan for I&D right knee today by Dr. Doreatha Martin.  Risks, benefits and possible complications reviewed.

## 2017-02-06 NOTE — Discharge Summary (Signed)
[facility]  Patient ID: Mackenzie [name] MRN: [id number] DOB/AGE: 02-26-1940 76 y.o.  Admit date: 02/06/2017 Discharge date: 02/06/2017  Admission Diagnoses: Septic Arthritis  Discharge Diagnoses:  Knee osteoarthritis Positive joint fluid culture  Procedures Performed:None  Discharged Condition: good  Hospital Course: The patient was admitted under presumed septic arthritis to a positive blood culture from an aspiration 1 week ago.  I examined her and her clinical exam was benign and no signs of infection.  I felt that his surgery was unnecessary and on warranted.  I offered her a aspiration but the patient wishes to proceed with close follow-up without proceeding with the aspiration.  She understands the risks and benefits of the findings of the culture as well as the symptoms of infection.  She was discharged home few hours after her admission.  Consults: None  Significant Diagnostic Studies: None  Treatments: surgery: None  Discharge Exam: On exam she is alert and awake and pleasant. Her knee is completely benign. There is no erythema no swelling there is no effusion on exam. She has full knee range of motion with no pain whatsoever. She has no tenderness to palpation. She is neurovascularly intact and no skin lesions noted on any portion of her leg. I evaluated her back and other areas of her arms and legs without any noticeable skin lesions.  Disposition: 01-Home or Self Care   Allergies as of 02/06/2017      Reactions   Palladium Chloride Other (See Comments)   Positive patch test    Statins Other (See Comments)   Atorvastatin, pravastatin, simvastatin->myalgias   Bacitracin Rash   Gold Sodium Thiosulfate Rash   Nickel Itching, Rash   Possibility...still trying to figure out      Medication List    TAKE these medications   alendronate 70 MG tablet Commonly known as:  FOSAMAX Take 70 mg by mouth once a week. Saturdays   ALPRAZolam 0.5  MG tablet Commonly known as:  XANAX Take 0.5 mg by mouth 2 (two) times daily as needed. Anxiety/sleep aid   aspirin 81 MG tablet Take 81 mg by mouth daily.   AZOR 5-40 MG tablet Generic drug:  amLODipine-olmesartan Take 1 tablet by mouth at bedtime.   citalopram 10 MG tablet Commonly known as:  CELEXA Take 10 mg by mouth daily.   docusate sodium 100 MG capsule Commonly known as:  COLACE Take 1 capsule (100 mg total) by mouth every 12 (twelve) hours.   metoprolol succinate 25 MG 24 hr tablet Commonly known as:  TOPROL-XL Take 25 mg by mouth daily.   omeprazole 40 MG capsule Commonly known as:  PRILOSEC Take 1 capsule (40 mg total) by mouth daily.   pravastatin 40 MG tablet Commonly known as:  PRAVACHOL Take 40 mg by mouth 3 (three) times a week.   PROAIR HFA 108 (90 Base) MCG/ACT inhaler Generic drug:  albuterol Inhale 1 puff into the lungs every 4 (four) hours as needed for wheezing.   VITAMIN B-12 IJ Inject 1 mL as directed every 30 (thirty) days.      Follow-up Information    [facility] Schedule an appointment as soon as possible for a visit in 4 day(s).   Specialty:  Orthopedic Surgery Contact information: [name] [address] [phone]        Shona Needles, MD. Schedule an appointment as soon as possible for a visit.   Specialty:  Orthopedic Surgery Why:  As  needed Contact information: [place] [address] [phone]           Discharge Instructions and Plan: The patient will be discharged home.  She will call to make an follow-up appointment with Dr. Noemi Chapel later this week.  She was given instructions about signs and symptoms of infection to call if he gets worse or recurs. No antibiotics were prescribed.  Signed:  Shona Needles, MD 02/06/2017, 4:51 PM

## 2017-02-06 NOTE — H&P (View-Only) (Signed)
Mackenzie [name] is a 76 year old female who has a significant history of right knee pain. She continues to have difficulty with pain in her knee and foot. She has a history of pseudogout but she is also having difficulty with rashes on her back and shoulder.  NO KNOWN DRUG ALLERGIES  Medications:  Celexa, HCTZ, Metoprolol, Amlodipine, Valsartan, Prednisone as needed, B-12 and aspirin. Past medical history: Hypertension, depression, psuedogout and rash. Family history: Hypertension. Social History: Nonsmoker.   EXAMINATION: Well-developed, well-nourished 76 year old female.   She is independently ambulatory with a moderate antalgic gait. The right knee has a 3+ effusion. Active range of motion of -5 to 105 degrees. Moderate pain. Moderate warmth. She has 2+ dorsalis pedis pulses and is neurovascularly intact. The left knee has a full range of motion with 1 + crepitus and 1+ synovitis. Neurovascularly intact distally.   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  She has an area on her backside which is healing rash which has scabbed over, she also has one on her shoulder. They are approximately 5 inches in diameter. She saw a dermatologist about it but she was not sure what it was.   IMPRESSION: Right knee joint infection History of pseudogout. Hypertension. Depression. Migratory rash.  PLAN: Will plan for I&D right knee today by Dr. Doreatha Martin.  Risks, benefits and possible complications reviewed.

## 2017-02-06 NOTE — Discharge Instructions (Addendum)
You do not clinically have septic arthritis of your knee and did not need a surgery. Continue to monitor the knee for signs of infection including swelling, redness, and pain with motion. If you have any of these symptoms seek medical attention as soon as possible. Please call the [facility] on Wednesday 12/26 to schedule an appointment.

## 2017-02-06 NOTE — H&P (Signed)
Mackenzie [name] is a 76 year old female who has a significant history of right knee pain. She continues to have difficulty with pain in her knee and foot. She has a history of pseudogout but she is also having difficulty with rashes on her back and shoulder.  NO KNOWN DRUG ALLERGIES  Medications:  Celexa, HCTZ, Metoprolol, Amlodipine, Valsartan, Prednisone as needed, B-12 and aspirin. Past medical history: Hypertension, depression, psuedogout and rash. Family history: Hypertension. Social History: Nonsmoker.   EXAMINATION: Well-developed, well-nourished 76 year old female.   She is independently ambulatory with a moderate antalgic gait. The right knee has a 3+ effusion. Active range of motion of -5 to 105 degrees. Moderate pain. Moderate warmth. She has 2+ dorsalis pedis pulses and is neurovascularly intact. The left knee has a full range of motion with 1 + crepitus and 1+ synovitis. Neurovascularly intact distally.   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  She has an area on her backside which is healing rash which has scabbed over, she also has one on her shoulder. They are approximately 5 inches in diameter. She saw a dermatologist about it but she was not sure what it was.   IMPRESSION: Right knee joint infection History of pseudogout. Hypertension. Depression. Migratory rash.  PLAN: Will plan for I&D right knee today by Dr. Doreatha Martin.  Risks, benefits and possible complications reviewed.

## 2017-02-09 DIAGNOSIS — M1731 Unilateral post-traumatic osteoarthritis, right knee: Secondary | ICD-10-CM | POA: Diagnosis not present

## 2017-02-16 DIAGNOSIS — T8149XA Infection following a procedure, other surgical site, initial encounter: Secondary | ICD-10-CM | POA: Diagnosis not present

## 2017-02-20 ENCOUNTER — Other Ambulatory Visit: Payer: Self-pay

## 2017-02-20 ENCOUNTER — Other Ambulatory Visit: Payer: Self-pay | Admitting: Physician Assistant

## 2017-02-20 ENCOUNTER — Encounter (HOSPITAL_BASED_OUTPATIENT_CLINIC_OR_DEPARTMENT_OTHER): Payer: Self-pay | Admitting: *Deleted

## 2017-02-20 DIAGNOSIS — T8149XD Infection following a procedure, other surgical site, subsequent encounter: Secondary | ICD-10-CM | POA: Diagnosis not present

## 2017-02-20 DIAGNOSIS — M25561 Pain in right knee: Secondary | ICD-10-CM | POA: Diagnosis not present

## 2017-02-21 ENCOUNTER — Other Ambulatory Visit: Payer: Self-pay

## 2017-02-21 ENCOUNTER — Ambulatory Visit (HOSPITAL_BASED_OUTPATIENT_CLINIC_OR_DEPARTMENT_OTHER): Payer: Medicare Other | Admitting: Anesthesiology

## 2017-02-21 ENCOUNTER — Encounter (HOSPITAL_BASED_OUTPATIENT_CLINIC_OR_DEPARTMENT_OTHER)
Admission: RE | Disposition: A | Discharge status: Home / Self Care | Payer: Self-pay | Source: Ambulatory Visit | Attending: Orthopedic Surgery

## 2017-02-21 ENCOUNTER — Encounter (HOSPITAL_BASED_OUTPATIENT_CLINIC_OR_DEPARTMENT_OTHER): Payer: Self-pay | Admitting: *Deleted

## 2017-02-21 ENCOUNTER — Ambulatory Visit (HOSPITAL_BASED_OUTPATIENT_CLINIC_OR_DEPARTMENT_OTHER)
Admission: RE | Admit: 2017-02-21 | Discharge: 2017-02-21 | Disposition: A | Discharge status: Home / Self Care | Payer: Medicare Other | Source: Ambulatory Visit | Attending: Orthopedic Surgery | Admitting: Orthopedic Surgery

## 2017-02-21 DIAGNOSIS — S83281A Other tear of lateral meniscus, current injury, right knee, initial encounter: Secondary | ICD-10-CM | POA: Diagnosis not present

## 2017-02-21 DIAGNOSIS — M23251 Derangement of posterior horn of lateral meniscus due to old tear or injury, right knee: Secondary | ICD-10-CM | POA: Insufficient documentation

## 2017-02-21 DIAGNOSIS — M94261 Chondromalacia, right knee: Secondary | ICD-10-CM | POA: Diagnosis not present

## 2017-02-21 DIAGNOSIS — Z7982 Long term (current) use of aspirin: Secondary | ICD-10-CM | POA: Insufficient documentation

## 2017-02-21 DIAGNOSIS — M19042 Primary osteoarthritis, left hand: Secondary | ICD-10-CM | POA: Diagnosis not present

## 2017-02-21 DIAGNOSIS — Z888 Allergy status to other drugs, medicaments and biological substances status: Secondary | ICD-10-CM | POA: Diagnosis not present

## 2017-02-21 DIAGNOSIS — M170 Bilateral primary osteoarthritis of knee: Secondary | ICD-10-CM | POA: Diagnosis not present

## 2017-02-21 DIAGNOSIS — Z87891 Personal history of nicotine dependence: Secondary | ICD-10-CM | POA: Insufficient documentation

## 2017-02-21 DIAGNOSIS — M00861 Arthritis due to other bacteria, right knee: Secondary | ICD-10-CM | POA: Diagnosis not present

## 2017-02-21 DIAGNOSIS — I739 Peripheral vascular disease, unspecified: Secondary | ICD-10-CM | POA: Diagnosis not present

## 2017-02-21 DIAGNOSIS — M23231 Derangement of other medial meniscus due to old tear or injury, right knee: Secondary | ICD-10-CM | POA: Insufficient documentation

## 2017-02-21 DIAGNOSIS — Z79899 Other long term (current) drug therapy: Secondary | ICD-10-CM | POA: Diagnosis not present

## 2017-02-21 DIAGNOSIS — M19041 Primary osteoarthritis, right hand: Secondary | ICD-10-CM | POA: Insufficient documentation

## 2017-02-21 DIAGNOSIS — M11261 Other chondrocalcinosis, right knee: Secondary | ICD-10-CM | POA: Diagnosis present

## 2017-02-21 DIAGNOSIS — M009 Pyogenic arthritis, unspecified: Secondary | ICD-10-CM | POA: Diagnosis not present

## 2017-02-21 DIAGNOSIS — E785 Hyperlipidemia, unspecified: Secondary | ICD-10-CM | POA: Diagnosis not present

## 2017-02-21 DIAGNOSIS — M65861 Other synovitis and tenosynovitis, right lower leg: Secondary | ICD-10-CM | POA: Diagnosis not present

## 2017-02-21 DIAGNOSIS — F329 Major depressive disorder, single episode, unspecified: Secondary | ICD-10-CM | POA: Diagnosis not present

## 2017-02-21 DIAGNOSIS — S83241A Other tear of medial meniscus, current injury, right knee, initial encounter: Secondary | ICD-10-CM | POA: Diagnosis not present

## 2017-02-21 DIAGNOSIS — I2510 Atherosclerotic heart disease of native coronary artery without angina pectoris: Secondary | ICD-10-CM | POA: Diagnosis not present

## 2017-02-21 DIAGNOSIS — N289 Disorder of kidney and ureter, unspecified: Secondary | ICD-10-CM

## 2017-02-21 DIAGNOSIS — Z86010 Personal history of colonic polyps: Secondary | ICD-10-CM | POA: Diagnosis not present

## 2017-02-21 DIAGNOSIS — F419 Anxiety disorder, unspecified: Secondary | ICD-10-CM | POA: Insufficient documentation

## 2017-02-21 DIAGNOSIS — I10 Essential (primary) hypertension: Secondary | ICD-10-CM | POA: Diagnosis present

## 2017-02-21 HISTORY — DX: Other chondrocalcinosis, right knee: M11.261

## 2017-02-21 HISTORY — PX: CHONDROPLASTY: SHX5177

## 2017-02-21 HISTORY — PX: IRRIGATION AND DEBRIDEMENT KNEE: SHX5185

## 2017-02-21 HISTORY — DX: Disorder of kidney and ureter, unspecified: N28.9

## 2017-02-21 LAB — CBC WITH DIFFERENTIAL/PLATELET
BASOS ABS: 0 10*3/uL (ref 0.0–0.1)
Basophils Relative: 0 %
EOS PCT: 11 %
Eosinophils Absolute: 1 10*3/uL — ABNORMAL HIGH (ref 0.0–0.7)
HCT: 32.5 % — ABNORMAL LOW (ref 36.0–46.0)
HEMOGLOBIN: 9.9 g/dL — AB (ref 12.0–15.0)
LYMPHS ABS: 1.9 10*3/uL (ref 0.7–4.0)
LYMPHS PCT: 20 %
MCH: 28.9 pg (ref 26.0–34.0)
MCHC: 30.5 g/dL (ref 30.0–36.0)
MCV: 94.8 fL (ref 78.0–100.0)
Monocytes Absolute: 0.9 10*3/uL (ref 0.1–1.0)
Monocytes Relative: 9 %
NEUTROS PCT: 60 %
Neutro Abs: 5.8 10*3/uL (ref 1.7–7.7)
PLATELETS: 390 10*3/uL (ref 150–400)
RBC: 3.43 MIL/uL — AB (ref 3.87–5.11)
RDW: 13.1 % (ref 11.5–15.5)
WBC: 9.7 10*3/uL (ref 4.0–10.5)

## 2017-02-21 LAB — COMPREHENSIVE METABOLIC PANEL
ALK PHOS: 104 U/L (ref 38–126)
ALT: 10 U/L — AB (ref 14–54)
AST: 15 U/L (ref 15–41)
Albumin: 3.3 g/dL — ABNORMAL LOW (ref 3.5–5.0)
Anion gap: 10 (ref 5–15)
BUN: 52 mg/dL — AB (ref 6–20)
CALCIUM: 8.9 mg/dL (ref 8.9–10.3)
CHLORIDE: 101 mmol/L (ref 101–111)
CO2: 25 mmol/L (ref 22–32)
CREATININE: 1.69 mg/dL — AB (ref 0.44–1.00)
GFR, EST AFRICAN AMERICAN: 32 mL/min — AB (ref >60)
GFR, EST NON AFRICAN AMERICAN: 28 mL/min — AB (ref >60)
Glucose, Bld: 102 mg/dL — ABNORMAL HIGH (ref 65–99)
Potassium: 4.9 mmol/L (ref 3.5–5.1)
SODIUM: 136 mmol/L (ref 135–145)
Total Bilirubin: 0.6 mg/dL (ref 0.3–1.2)
Total Protein: 7.6 g/dL (ref 6.5–8.1)

## 2017-02-21 LAB — C-REACTIVE PROTEIN: CRP: 6.6 mg/dL — ABNORMAL HIGH (ref <1.0)

## 2017-02-21 LAB — SEDIMENTATION RATE: Sed Rate: 73 mm/hr — ABNORMAL HIGH (ref 0–22)

## 2017-02-21 LAB — LACTIC ACID, PLASMA: LACTIC ACID, VENOUS: 2.2 mmol/L — AB (ref 0.5–1.9)

## 2017-02-21 SURGERY — CHONDROPLASTY
Anesthesia: General | Site: Knee | Laterality: Right

## 2017-02-21 MED ORDER — BUPIVACAINE-EPINEPHRINE (PF) 0.25% -1:200000 IJ SOLN
INTRAMUSCULAR | Status: DC | PRN
  Administered 2017-02-21: 20 mL

## 2017-02-21 MED ORDER — ONDANSETRON HCL 4 MG PO TABS: 4.0000 mg | ORAL_TABLET | Freq: Four times a day (QID) | ORAL | Status: DC | PRN

## 2017-02-21 MED ORDER — ONDANSETRON HCL 4 MG/2ML IJ SOLN
INTRAMUSCULAR | Status: AC
  Filled 2017-02-21: qty 2

## 2017-02-21 MED ORDER — ALBUTEROL SULFATE HFA 108 (90 BASE) MCG/ACT IN AERS: 1.0000 | INHALATION_SPRAY | RESPIRATORY_TRACT | Status: DC | PRN

## 2017-02-21 MED ORDER — EPHEDRINE SULFATE 50 MG/ML IJ SOLN
INTRAMUSCULAR | Status: DC | PRN
  Administered 2017-02-21: 10 mg via INTRAVENOUS

## 2017-02-21 MED ORDER — EPINEPHRINE 30 MG/30ML IJ SOLN
INTRAMUSCULAR | Status: AC
  Filled 2017-02-21: qty 1

## 2017-02-21 MED ORDER — PHENYLEPHRINE 40 MCG/ML (10ML) SYRINGE FOR IV PUSH (FOR BLOOD PRESSURE SUPPORT)
PREFILLED_SYRINGE | INTRAVENOUS | Status: AC
  Filled 2017-02-21: qty 10

## 2017-02-21 MED ORDER — FUROSEMIDE 10 MG/ML IJ SOLN
INTRAMUSCULAR | Status: AC
  Filled 2017-02-21: qty 4

## 2017-02-21 MED ORDER — METOCLOPRAMIDE HCL 5 MG/ML IJ SOLN: 5.0000 mg | Freq: Three times a day (TID) | INTRAMUSCULAR | Status: DC | PRN

## 2017-02-21 MED ORDER — ACETAMINOPHEN 500 MG PO TABS
ORAL_TABLET | ORAL | Status: AC
  Filled 2017-02-21: qty 2

## 2017-02-21 MED ORDER — FENTANYL CITRATE (PF) 100 MCG/2ML IJ SOLN
INTRAMUSCULAR | Status: AC
  Filled 2017-02-21: qty 2

## 2017-02-21 MED ORDER — PROPOFOL 10 MG/ML IV BOLUS
INTRAVENOUS | Status: DC | PRN
  Administered 2017-02-21 (×4): 50 mg via INTRAVENOUS

## 2017-02-21 MED ORDER — LACTATED RINGERS IV SOLN
INTRAVENOUS | Status: DC
  Administered 2017-02-21 (×3): via INTRAVENOUS

## 2017-02-21 MED ORDER — PHENYLEPHRINE HCL 10 MG/ML IJ SOLN
INTRAMUSCULAR | Status: DC | PRN
  Administered 2017-02-21: 80 ug via INTRAVENOUS

## 2017-02-21 MED ORDER — FENTANYL CITRATE (PF) 100 MCG/2ML IJ SOLN: 50.0000 ug | INTRAMUSCULAR | Status: DC | PRN

## 2017-02-21 MED ORDER — CEFTRIAXONE SODIUM 2 G IJ SOLR
2.0000 g | Freq: Once | INTRAMUSCULAR | Status: DC
  Filled 2017-02-21: qty 2

## 2017-02-21 MED ORDER — ALBUTEROL SULFATE HFA 108 (90 BASE) MCG/ACT IN AERS
INHALATION_SPRAY | RESPIRATORY_TRACT | Status: AC
Start: 2017-02-21 — End: ?
  Filled 2017-02-21: qty 6.7

## 2017-02-21 MED ORDER — ALBUTEROL SULFATE (2.5 MG/3ML) 0.083% IN NEBU
2.5000 mg | INHALATION_SOLUTION | Freq: Four times a day (QID) | RESPIRATORY_TRACT | Status: DC | PRN
  Administered 2017-02-21 (×2): 2.5 mg via RESPIRATORY_TRACT

## 2017-02-21 MED ORDER — SCOPOLAMINE 1 MG/3DAYS TD PT72: 1.0000 | MEDICATED_PATCH | Freq: Once | TRANSDERMAL | Status: DC | PRN

## 2017-02-21 MED ORDER — AMLODIPINE-OLMESARTAN 5-40 MG PO TABS: 1.0000 | ORAL_TABLET | Freq: Every day | ORAL | Status: DC

## 2017-02-21 MED ORDER — ALPRAZOLAM 0.5 MG PO TABS
0.5000 mg | ORAL_TABLET | Freq: Three times a day (TID) | ORAL | Status: DC | PRN
  Administered 2017-02-21: 0.5 mg via ORAL
  Filled 2017-02-21: qty 2

## 2017-02-21 MED ORDER — KETOROLAC TROMETHAMINE 30 MG/ML IJ SOLN
INTRAMUSCULAR | Status: AC
  Filled 2017-02-21: qty 1

## 2017-02-21 MED ORDER — FENTANYL CITRATE (PF) 100 MCG/2ML IJ SOLN
25.0000 ug | INTRAMUSCULAR | Status: DC | PRN
  Administered 2017-02-21 (×3): 25 ug via INTRAVENOUS

## 2017-02-21 MED ORDER — POVIDONE-IODINE 7.5 % EX SOLN: Freq: Once | CUTANEOUS | Status: DC

## 2017-02-21 MED ORDER — DEXAMETHASONE SODIUM PHOSPHATE 10 MG/ML IJ SOLN
INTRAMUSCULAR | Status: AC
  Filled 2017-02-21: qty 1

## 2017-02-21 MED ORDER — BUPIVACAINE-EPINEPHRINE 0.25% -1:200000 IJ SOLN
INTRAMUSCULAR | Status: AC
  Filled 2017-02-21: qty 1

## 2017-02-21 MED ORDER — LIDOCAINE HCL (CARDIAC) 20 MG/ML IV SOLN
INTRAVENOUS | Status: DC | PRN
  Administered 2017-02-21: 60 mg via INTRAVENOUS

## 2017-02-21 MED ORDER — ONDANSETRON HCL 4 MG/2ML IJ SOLN
INTRAMUSCULAR | Status: DC | PRN
  Administered 2017-02-21: 4 mg via INTRAVENOUS

## 2017-02-21 MED ORDER — PRAVASTATIN SODIUM 40 MG PO TABS: 40.0000 mg | ORAL_TABLET | ORAL | Status: DC

## 2017-02-21 MED ORDER — ALBUTEROL SULFATE (2.5 MG/3ML) 0.083% IN NEBU
INHALATION_SOLUTION | RESPIRATORY_TRACT | Status: AC
  Filled 2017-02-21: qty 3

## 2017-02-21 MED ORDER — FUROSEMIDE 10 MG/ML IJ SOLN
10.0000 mg | Freq: Once | INTRAMUSCULAR | Status: AC
  Administered 2017-02-21: 10 mg via INTRAVENOUS

## 2017-02-21 MED ORDER — HYDROCODONE-ACETAMINOPHEN 5-325 MG PO TABS
1.0000 | ORAL_TABLET | ORAL | Status: DC | PRN
  Administered 2017-02-21 (×3): 1 via ORAL
  Filled 2017-02-21 (×3): qty 1

## 2017-02-21 MED ORDER — LACTATED RINGERS IV SOLN: INTRAVENOUS | Status: DC

## 2017-02-21 MED ORDER — ONDANSETRON HCL 4 MG/2ML IJ SOLN: 4.0000 mg | Freq: Four times a day (QID) | INTRAMUSCULAR | Status: DC | PRN

## 2017-02-21 MED ORDER — ASPIRIN 81 MG PO TABS: 81.0000 mg | ORAL_TABLET | Freq: Every day | ORAL | Status: DC

## 2017-02-21 MED ORDER — MIDAZOLAM HCL 2 MG/2ML IJ SOLN: 1.0000 mg | INTRAMUSCULAR | Status: DC | PRN

## 2017-02-21 MED ORDER — SODIUM CHLORIDE 0.9 % IV SOLN
INTRAVENOUS | Status: DC
  Administered 2017-02-21: 12:00:00 via INTRAVENOUS

## 2017-02-21 MED ORDER — DEXAMETHASONE SODIUM PHOSPHATE 4 MG/ML IJ SOLN
INTRAMUSCULAR | Status: DC | PRN
  Administered 2017-02-21: 10 mg via INTRAVENOUS

## 2017-02-21 MED ORDER — LIDOCAINE 2% (20 MG/ML) 5 ML SYRINGE
INTRAMUSCULAR | Status: AC
  Filled 2017-02-21: qty 5

## 2017-02-21 MED ORDER — KETOROLAC TROMETHAMINE 30 MG/ML IJ SOLN
INTRAMUSCULAR | Status: DC | PRN
  Administered 2017-02-21: 30 mg via INTRAVENOUS

## 2017-02-21 MED ORDER — DEXTROSE 5 % IV SOLN
INTRAVENOUS | Status: DC | PRN
  Administered 2017-02-21: 2 g via INTRAVENOUS

## 2017-02-21 MED ORDER — METOCLOPRAMIDE HCL 5 MG PO TABS: 5.0000 mg | ORAL_TABLET | Freq: Three times a day (TID) | ORAL | Status: DC | PRN

## 2017-02-21 MED ORDER — OXYCODONE HCL 5 MG PO TABS: 5.0000 mg | ORAL_TABLET | ORAL | Status: DC | PRN

## 2017-02-21 MED ORDER — DOCUSATE SODIUM 100 MG PO CAPS: 100.0000 mg | ORAL_CAPSULE | Freq: Two times a day (BID) | ORAL | Status: DC

## 2017-02-21 MED ORDER — ACETAMINOPHEN 500 MG PO TABS
1000.0000 mg | ORAL_TABLET | Freq: Once | ORAL | Status: AC
  Administered 2017-02-21: 1000 mg via ORAL

## 2017-02-21 MED ORDER — METOPROLOL SUCCINATE ER 25 MG PO TB24
25.0000 mg | ORAL_TABLET | Freq: Every day | ORAL | Status: DC
  Administered 2017-02-21: 25 mg via ORAL

## 2017-02-21 MED ORDER — CITALOPRAM HYDROBROMIDE 10 MG PO TABS
10.0000 mg | ORAL_TABLET | Freq: Every day | ORAL | Status: DC
  Administered 2017-02-21: 10 mg via ORAL

## 2017-02-21 MED ORDER — METHYLPREDNISOLONE ACETATE 80 MG/ML IJ SUSP
INTRAMUSCULAR | Status: AC
  Filled 2017-02-21: qty 1

## 2017-02-21 MED ORDER — POLYETHYLENE GLYCOL 3350 17 G PO PACK: 17.0000 g | PACK | Freq: Two times a day (BID) | ORAL | Status: DC

## 2017-02-21 MED ORDER — ONDANSETRON HCL 4 MG/2ML IJ SOLN: 4.0000 mg | Freq: Once | INTRAMUSCULAR | Status: DC | PRN

## 2017-02-21 SURGICAL SUPPLY — 44 items
BANDAGE ACE 6X5 VEL STRL LF (GAUZE/BANDAGES/DRESSINGS) ×3 IMPLANT
BLADE CUDA GRT WHITE 3.5 (BLADE) IMPLANT
BLADE CUTTER GATOR 3.5 (BLADE) ×2 IMPLANT
BLADE GREAT WHITE 4.2 (BLADE) IMPLANT
BLADE GREAT WHITE 4.2MM (BLADE)
BLADE SURG 15 STRL LF DISP TIS (BLADE) IMPLANT
BLADE SURG 15 STRL SS (BLADE)
BNDG COHESIVE 4X5 TAN STRL (GAUZE/BANDAGES/DRESSINGS) ×2 IMPLANT
DRAPE ARTHROSCOPY W/POUCH 90 (DRAPES) ×3 IMPLANT
DURAPREP 26ML APPLICATOR (WOUND CARE) ×3 IMPLANT
GAUZE SPONGE 4X4 12PLY STRL (GAUZE/BANDAGES/DRESSINGS) ×3 IMPLANT
GAUZE XEROFORM 1X8 LF (GAUZE/BANDAGES/DRESSINGS) ×3 IMPLANT
GLOVE BIO SURGEON STRL SZ7 (GLOVE) ×3 IMPLANT
GLOVE BIOGEL PI IND STRL 7.0 (GLOVE) ×1 IMPLANT
GLOVE BIOGEL PI IND STRL 7.5 (GLOVE) ×1 IMPLANT
GLOVE BIOGEL PI INDICATOR 7.0 (GLOVE) ×2
GLOVE BIOGEL PI INDICATOR 7.5 (GLOVE) ×2
GLOVE SS BIOGEL STRL SZ 7.5 (GLOVE) ×1 IMPLANT
GLOVE SUPERSENSE BIOGEL SZ 7.5 (GLOVE) ×2
GOWN STRL REUS W/ TWL LRG LVL3 (GOWN DISPOSABLE) ×2 IMPLANT
GOWN STRL REUS W/ TWL XL LVL3 (GOWN DISPOSABLE) ×1 IMPLANT
GOWN STRL REUS W/TWL LRG LVL3 (GOWN DISPOSABLE) ×3
GOWN STRL REUS W/TWL XL LVL3 (GOWN DISPOSABLE) ×3
HOLDER KNEE FOAM BLUE (MISCELLANEOUS) ×3 IMPLANT
KNEE WRAP E Z 3 GEL PACK (MISCELLANEOUS) ×3 IMPLANT
MANIFOLD NEPTUNE II (INSTRUMENTS) IMPLANT
NDL SAFETY ECLIPSE 18X1.5 (NEEDLE) ×2 IMPLANT
NEEDLE HYPO 18GX1.5 SHARP (NEEDLE) ×6
NEEDLE HYPO 22GX1.5 SAFETY (NEEDLE) ×4 IMPLANT
PACK ARTHROSCOPY DSU (CUSTOM PROCEDURE TRAY) ×3 IMPLANT
PACK BASIN DAY SURGERY FS (CUSTOM PROCEDURE TRAY) ×3 IMPLANT
PAD ALCOHOL SWAB (MISCELLANEOUS) IMPLANT
SUCTION FRAZIER HANDLE 10FR (MISCELLANEOUS)
SUCTION TUBE FRAZIER 10FR DISP (MISCELLANEOUS) IMPLANT
SUT ETHILON 4 0 PS 2 18 (SUTURE) ×3 IMPLANT
SUT PROLENE 3 0 PS 2 (SUTURE) IMPLANT
SUT VIC AB 3-0 PS1 18 (SUTURE)
SUT VIC AB 3-0 PS1 18XBRD (SUTURE) IMPLANT
SYR 20CC LL (SYRINGE) IMPLANT
SYR 5ML LL (SYRINGE) ×3 IMPLANT
TOWEL OR 17X24 6PK STRL BLUE (TOWEL DISPOSABLE) ×3 IMPLANT
TUBING ARTHRO INFLOW-ONLY STRL (TUBING) ×3 IMPLANT
WAND STAR VAC 90 (SURGICAL WAND) ×2 IMPLANT
WATER STERILE IRR 1000ML POUR (IV SOLUTION) ×3 IMPLANT

## 2017-02-21 NOTE — Interval H&P Note (Signed)
History and Physical Interval Note:  02/21/2017 7:11 AM  Mackenzie [name]  has presented today for surgery, with the diagnosis of RIGHT KNEE INFECTION  The various methods of treatment have been discussed with the patient and family. After consideration of risks, benefits and other options for treatment, the patient has consented to  Procedure(s): CHONDROPLASTY WITH DEBRIDEMENT (Right) IRRIGATION AND DEBRIDEMENT KNEE (Right) as a surgical intervention .  The patient's history has been reviewed, patient examined, no change in status, stable for surgery.  I have reviewed the patient's chart and labs.  Questions were answered to the patient's satisfaction.     Lorn Junes

## 2017-02-21 NOTE — Anesthesia Procedure Notes (Signed)
Procedure Name: LMA Insertion Date/Time: 02/21/2017 7:43 AM Performed by: Marrianne Mood, CRNA Pre-anesthesia Checklist: Patient identified, Emergency Drugs available, Suction available, Patient being monitored and Timeout performed Patient Re-evaluated:Patient Re-evaluated prior to induction Oxygen Delivery Method: Circle system utilized Preoxygenation: Pre-oxygenation with 100% oxygen Induction Type: IV induction Ventilation: Mask ventilation without difficulty LMA: LMA inserted LMA Size: 3.0 Number of attempts: 1 Airway Equipment and Method: Bite block Placement Confirmation: positive ETCO2 Tube secured with: Tape Dental Injury: Teeth and Oropharynx as per pre-operative assessment

## 2017-02-21 NOTE — Anesthesia Postprocedure Evaluation (Signed)
Anesthesia Post Note  Patient: [name]  Procedure(s) Performed: CHONDROPLASTY WITH DEBRIDEMENT (Right Knee) IRRIGATION AND DEBRIDEMENT KNEE (Right Knee)     Patient location during evaluation: PACU Anesthesia Type: General Level of consciousness: awake and alert Pain management: pain level controlled Vital Signs Assessment: post-procedure vital signs reviewed and stable Respiratory status: spontaneous breathing, nonlabored ventilation and respiratory function stable Cardiovascular status: blood pressure returned to baseline and stable Postop Assessment: no apparent nausea or vomiting Anesthetic complications: no    Last Vitals:  Vitals:   02/21/17 0940 02/21/17 0945  BP:  (!) 146/62  Pulse: 66 70  Resp: 16 18  Temp:    SpO2: 100% 100%    Last Pain:  Vitals:   02/21/17 0940  TempSrc:   PainSc: 5                  Catalina Gravel

## 2017-02-21 NOTE — Anesthesia Preprocedure Evaluation (Addendum)
Anesthesia Evaluation  Patient identified by MRN, date of birth, ID band Patient awake    Reviewed: Allergy & Precautions, NPO status , Patient's Chart, lab work & pertinent test results, reviewed documented beta blocker date and time   Airway Mallampati: II  TM Distance: <3 FB Neck ROM: Full    Dental  (+) Teeth Intact, Dental Advisory Given   Pulmonary former smoker,    Pulmonary exam normal breath sounds clear to auscultation       Cardiovascular hypertension, Pt. on home beta blockers and Pt. on medications + angina with exertion + CAD and + Peripheral Vascular Disease  Normal cardiovascular exam Rhythm:Regular Rate:Normal  LHC 02/2015: 1. Minor nonobstructive CAD as detailed below with calcified coronary arteries but no obstruction 2. Normal LV function   Neuro/Psych PSYCHIATRIC DISORDERS Anxiety Depression negative neurological ROS     GI/Hepatic negative GI ROS, Neg liver ROS,   Endo/Other  negative endocrine ROS  Renal/GU negative Renal ROS     Musculoskeletal  (+) Arthritis , Osteoarthritis,    Abdominal   Peds  Hematology  (+) Blood dyscrasia, anemia ,   Anesthesia Other Findings Day of surgery medications reviewed with the patient.  Reproductive/Obstetrics                            Anesthesia Physical Anesthesia Plan  ASA: III  Anesthesia Plan: General   Post-op Pain Management:    Induction: Intravenous  PONV Risk Score and Plan: 3 and Dexamethasone, Ondansetron and Treatment may vary due to age or medical condition  Airway Management Planned: LMA  Additional Equipment:   Intra-op Plan:   Post-operative Plan: Extubation in OR  Informed Consent: I have reviewed the patients History and Physical, chart, labs and discussed the procedure including the risks, benefits and alternatives for the proposed anesthesia with the patient or authorized representative who has  indicated his/her understanding and acceptance.   Dental advisory given  Plan Discussed with: CRNA  Anesthesia Plan Comments: (Risks/benefits of general anesthesia discussed with patient including risk of damage to teeth, lips, gum, and tongue, nausea/vomiting, allergic reactions to medications, and the possibility of heart attack, stroke and death.  All patient questions answered.  Patient wishes to proceed.)       Anesthesia Quick Evaluation

## 2017-02-21 NOTE — Transfer of Care (Signed)
Immediate Anesthesia Transfer of Care Note  Patient: Mackenzie [name]  Procedure(s) Performed: CHONDROPLASTY WITH DEBRIDEMENT (Right Knee) IRRIGATION AND DEBRIDEMENT KNEE (Right Knee)  Patient Location: PACU  Anesthesia Type:General  Level of Consciousness: sedated  Airway & Oxygen Therapy: Patient Spontanous Breathing and Patient connected to face mask oxygen  Post-op Assessment: Report given to RN and Post -op Vital signs reviewed and stable  Post vital signs: Reviewed and stable  Last Vitals:  Vitals:   02/21/17 0644  BP: 108/70  Pulse: 65  Resp: 16  Temp: 37.1 C  SpO2: 99%    Last Pain:  Vitals:   02/21/17 0644  TempSrc: Oral         Complications: No apparent anesthesia complications

## 2017-02-21 NOTE — Progress Notes (Signed)
Spoke with Mackenzie [name] on the phone regarding critical lab results. Will closely monitor patient overnight and PA will be by in am to see patient and assess. Will continue to monitor patient. [name], [name]

## 2017-02-22 ENCOUNTER — Ambulatory Visit (INDEPENDENT_AMBULATORY_CARE_PROVIDER_SITE_OTHER): Payer: Medicare Other | Admitting: Infectious Diseases

## 2017-02-22 ENCOUNTER — Encounter (HOSPITAL_BASED_OUTPATIENT_CLINIC_OR_DEPARTMENT_OTHER): Payer: Self-pay | Admitting: Physician Assistant

## 2017-02-22 ENCOUNTER — Encounter: Payer: Self-pay | Admitting: Infectious Diseases

## 2017-02-22 DIAGNOSIS — M11261 Other chondrocalcinosis, right knee: Secondary | ICD-10-CM | POA: Diagnosis present

## 2017-02-22 DIAGNOSIS — M23231 Derangement of other medial meniscus due to old tear or injury, right knee: Secondary | ICD-10-CM | POA: Diagnosis not present

## 2017-02-22 DIAGNOSIS — N289 Disorder of kidney and ureter, unspecified: Secondary | ICD-10-CM

## 2017-02-22 DIAGNOSIS — M00861 Arthritis due to other bacteria, right knee: Secondary | ICD-10-CM

## 2017-02-22 DIAGNOSIS — M23251 Derangement of posterior horn of lateral meniscus due to old tear or injury, right knee: Secondary | ICD-10-CM | POA: Diagnosis not present

## 2017-02-22 DIAGNOSIS — M94261 Chondromalacia, right knee: Secondary | ICD-10-CM | POA: Diagnosis not present

## 2017-02-22 DIAGNOSIS — M65861 Other synovitis and tenosynovitis, right lower leg: Secondary | ICD-10-CM | POA: Diagnosis not present

## 2017-02-22 HISTORY — DX: Other chondrocalcinosis, right knee: M11.261

## 2017-02-22 HISTORY — DX: Disorder of kidney and ureter, unspecified: N28.9

## 2017-02-22 LAB — CBC WITH DIFFERENTIAL/PLATELET
BASOS ABS: 0 10*3/uL (ref 0.0–0.1)
BASOS PCT: 0 %
Eosinophils Absolute: 0 10*3/uL (ref 0.0–0.7)
Eosinophils Relative: 0 %
HEMATOCRIT: 28.3 % — AB (ref 36.0–46.0)
HEMOGLOBIN: 8.8 g/dL — AB (ref 12.0–15.0)
LYMPHS PCT: 10 %
Lymphs Abs: 1.2 10*3/uL (ref 0.7–4.0)
MCH: 29.2 pg (ref 26.0–34.0)
MCHC: 31.1 g/dL (ref 30.0–36.0)
MCV: 94 fL (ref 78.0–100.0)
MONOS PCT: 4 %
Monocytes Absolute: 0.6 10*3/uL (ref 0.1–1.0)
NEUTROS ABS: 11.2 10*3/uL — AB (ref 1.7–7.7)
NEUTROS PCT: 86 %
Platelets: 382 10*3/uL (ref 150–400)
RBC: 3.01 MIL/uL — ABNORMAL LOW (ref 3.87–5.11)
RDW: 12.9 % (ref 11.5–15.5)
WBC: 13 10*3/uL — ABNORMAL HIGH (ref 4.0–10.5)

## 2017-02-22 LAB — COMPREHENSIVE METABOLIC PANEL
ALBUMIN: 2.9 g/dL — AB (ref 3.5–5.0)
ALK PHOS: 61 U/L (ref 38–126)
ALT: 11 U/L — ABNORMAL LOW (ref 14–54)
AST: 20 U/L (ref 15–41)
Anion gap: 9 (ref 5–15)
BILIRUBIN TOTAL: 0.6 mg/dL (ref 0.3–1.2)
BUN: 39 mg/dL — AB (ref 6–20)
CALCIUM: 8.5 mg/dL — AB (ref 8.9–10.3)
CO2: 24 mmol/L (ref 22–32)
CREATININE: 1.13 mg/dL — AB (ref 0.44–1.00)
Chloride: 106 mmol/L (ref 101–111)
GFR calc Af Amer: 53 mL/min — ABNORMAL LOW (ref >60)
GFR, EST NON AFRICAN AMERICAN: 45 mL/min — AB (ref >60)
GLUCOSE: 121 mg/dL — AB (ref 65–99)
Potassium: 4.5 mmol/L (ref 3.5–5.1)
Sodium: 139 mmol/L (ref 135–145)
TOTAL PROTEIN: 6.2 g/dL — AB (ref 6.5–8.1)

## 2017-02-22 LAB — URINE CULTURE: CULTURE: NO GROWTH

## 2017-02-22 LAB — ACID FAST SMEAR (AFB): ACID FAST SMEAR - AFSCU2: NEGATIVE

## 2017-02-22 MED ORDER — LEVOFLOXACIN 500 MG PO TABS: 500.0000 mg | ORAL_TABLET | Freq: Every day | ORAL | 0 refills | Status: DC

## 2017-02-22 MED ORDER — HYDROCODONE-ACETAMINOPHEN 5-325 MG PO TABS: 1.0000 | ORAL_TABLET | ORAL | 0 refills | Status: DC | PRN

## 2017-02-22 NOTE — Progress Notes (Signed)
   Subjective:    Patient ID: Mackenzie [name], female    DOB: July 24, 1940, 77 y.o.   MRN: [id number]  HPI 77 yo F with hx of R THR 2017, kyphooplasty done on L1 10-2015. Was being seen for hives at [facility], resolved ~ 1 yr ago.  She also states she has had injections in her R knee prior- synvisc and steroids.  She was seen by ortho on 12-21 for knee pain. 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 She was given intra-articular steroids.  She was in hospital 02-06-17 after she had persistent knee pain. . The Cx from her aspirate done in office at the dx of pseudogout showed P agglomerans. S- ceftriaxone, levaquin. R- Ancef.  She was seen by ortho and was not felt to have septic arthritis and was not in need of washout. She was d/c home same day.  She was seen in hospital on 02-21-17 with persistent pain and underwent chondroplasty, I & D of her R knee. She was d/c home this AM. Her Cx is pending from this.   Denies hx of injury to her knee, no hx of trauma.   The past medical history, family history and social history were reviewed/updated in EPIC  Review of Systems  Constitutional: Positive for appetite change. Negative for chills, fever and unexpected weight change.  Respiratory: Positive for wheezing. Negative for cough and shortness of breath.   Gastrointestinal: Positive for diarrhea. Negative for constipation.  Genitourinary: Negative for difficulty urinating.  Musculoskeletal: Positive for arthralgias and joint swelling.  had diarrhea yesterday. Usually qday.  Please see HPI. All other systems reviewed and negative.     Objective:   Physical Exam  Constitutional: She appears well-developed and well-nourished.  HENT:  Mouth/Throat: No oropharyngeal exudate.  Eyes: EOM are normal. Pupils are equal, round, and reactive to light.  Neck: Neck supple.  Cardiovascular: Normal rate, regular rhythm and normal heart sounds.  Pulmonary/Chest: Effort normal and breath sounds  normal.  Abdominal: Soft. Bowel sounds are normal. There is no tenderness. There is no rebound.  Musculoskeletal:       Legs: Lymphadenopathy:    She has no cervical adenopathy.          Assessment & Plan:

## 2017-02-22 NOTE — Assessment & Plan Note (Addendum)
The etiology of her infection is unclear.  I would give her oral levaquin for the next 6 weeks.  We discussed this.  I have reviewed her notes from ortho office.  I am still awaiting her Cx results from yesterday.  I am still awaiting the results from the Cx done at the ortho office on 12-21.  I will see her back in 2 weeks, if she is worsened will change to ceftriaxone via PIC.  Her celexa will interact with levaquin, she has not started it yet. Will stop this.

## 2017-02-22 NOTE — Op Note (Signed)
[name], [name]                   ACCOUNT NO.:  [id number]  MEDICAL RECORD NO.:  [id number]  LOCATION:                                 FACILITY:  PHYSICIAN:  Evola Hollis A. Noemi Chapel, M.D.      DATE OF BIRTH:  DATE OF PROCEDURE:  02/21/2017 DATE OF DISCHARGE:                              OPERATIVE REPORT   PREOPERATIVE DIAGNOSES: 1. Right knee infection/septic arthritis. 2. 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 3. Right knee pseudogout. 4. Right knee synovitis.  POSTOPERATIVE DIAGNOSES: 1. Right knee infection/septic arthritis. 2. 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 3. Right knee pseudogout. 4. Right knee synovitis.  PROCEDURES: 1. Right knee examination under anesthesia followed by arthroscopic     lavage. 2. Right knee partial medial and lateral meniscectomies with extensive     synovectomy and chondroplasty.  SURGEON:  Audree Camel. Noemi Chapel, MD.  ANESTHESIA:  General.  OPERATIVE TIME:  40 minutes.  COMPLICATIONS:  None.  INDICATIONS FOR PROCEDURE:  Mackenzie [name] is a 77 year old who has had significant right knee pain for the past year, but much worse over the past 2-3 weeks with recurrent pain and swelling.  Recent knee aspirations have revealed both pseudogout as well as an unusual infection of the Pantoea agglomerans.  This was grown on a culture from the recent knee aspiration.  She initially improved, but now has had significant increasing pain and is to undergo knee arthroscopic lavage as well as meniscectomy, chondroplasty, and synovectomy.  DESCRIPTION OF PROCEDURE:  Mackenzie [name] was brought to the operating room on February 21, 2017, and placed on the operating table in supine position.  After being placed under general anesthesia, right knee was examined.  Range of motion 0-125 degrees.  Knee was stable with normal ligamentous exam.  The right leg was prepped using sterile DuraPrep and draped using sterile  technique.  Time-out procedure was called and the correct right knee identified.  The knee was sterilely injected with 0.25% Marcaine with epinephrine.  Initially, through an anterolateral portal, the arthroscope with a pump attached was placed and through an anteromedial portal, an arthroscopic probe was placed.  On initial inspection of medial compartment, she had 80-90% grade 3 chondromalacia which was debrided.  Medial meniscus showed tearing of the posteromedial horn of which 50-60% was resected back to a stable but degenerative rim. There were calcium pyrophosphate crystals throughout the knee. Intercondylar notch showed anterior and posterior cruciate ligaments were intact.  Lateral compartment and articular cartilage showed grade 1 and 2 chondromalacia.  Lateral meniscus showed tearing of the posterior and lateral horn 30%, which was resected back to a stable rim again with calcium pyrophosphate crystals throughout the meniscus.  Significant anterior synovitis was thoroughly debrided and synovial cultures were also taken of the synovium.  Patellofemoral joint showed 80-90% grade 3 chondromalacia, which was debrided and another at 20-30% grade 4 changes.  Large amount of synovitis was thoroughly debrided and cauterized.  There was very cloudy fluid in the knee noted.  A thorough synovectomy had been carried out and cauterized through the medial and lateral gutters and the suprapatellar pouch and  anterior compartment. At this point, I felt that all pathology had been satisfactorily addressed.  9 L of saline was irrigated through the knee.  The arthroscopic portals were then closed with interrupted 4-0 nylon suture and the knee was injected with 10 mL of 0.25% Marcaine with epinephrine and 80 mg of Depo-Medrol.  Sterile dressings were applied and the patient awakened and taken to the recovery room in stable condition.  FOLLOWUP CARE:  Mackenzie [name] will be followed overnight at the  recovery care center for IV antibiotics and neurovascular monitoring.  She will be discharged tomorrow on oxycodone for pain.  We will see her back in office in a week for sutures out and followup.     Jahki Witham A. Noemi Chapel, M.D.   ______________________________ Audree Camel. Noemi Chapel, M.D.    RAW/MEDQ  D:  02/21/2017  T:  02/21/2017  Job:  [id number]

## 2017-02-22 NOTE — Discharge Summary (Signed)
Patient ID: Mackenzie [name] MRN: [id number] DOB/AGE: 1940-11-11 77 y.o.  Admit date: 02/21/2017 Discharge date: 02/22/2017  Admission Diagnoses:  Principal Problem:   Septic joint of right knee joint ([place]) Active Problems:   Arteriosclerotic cardiovascular disease (ASCVD)   Hyperlipidemia   Hypertension   Pseudogout of knee, right   Renal insufficiency   Discharge Diagnoses:  Same  Past Medical History:  Diagnosis Date  . Anemia, normocytic normochromic 01/2012   hx of  . Anxiety    takes Xanax daily as needed  . Arthritis    knees, hands  . Cataract    right eye  . Depression    takes Citalopram daily  . Dizziness    cardiologist is aware and told pt it was related to meds  . History of bronchitis    2014  . History of colon polyps    benign  . History of migraine   . Hyperlipidemia    takes Pravastatin daily  . Hypertension    takes Metoprolol and Azor daily  . Joint pain   . Pneumonia    hx of-2015  . Pseudogout of knee, right 02/22/2017  . Renal insufficiency 02/22/2017  . Rheumatic fever    hx of  . Wheezing on expiration     Surgeries: Procedure(s): CHONDROPLASTY WITH DEBRIDEMENT IRRIGATION AND DEBRIDEMENT KNEE on 02/21/2017   Consultants:   Discharged Condition: Improved  Hospital Course: Mackenzie [name] is an 77 y.o. female who was admitted 02/21/2017 for operative treatment ofSeptic joint of right knee joint ([place]). Patient has severe unremitting pain that affects sleep, daily activities, and work/hobbies. After pre-op clearance the patient was taken to the operating room on 02/21/2017 and underwent  Procedure(s): CHONDROPLASTY WITH DEBRIDEMENT IRRIGATION AND DEBRIDEMENT KNEE.    Patient was given perioperative antibiotics:  Anti-infectives (From admission, onward)   Start     Dose/Rate Route Frequency Ordered Stop   02/21/17 0745  cefTRIAXone (ROCEPHIN) 2 g in dextrose 5 % 50 mL IVPB  Status:  Discontinued     2 g 100 mL/hr over 30 Minutes Intravenous  Once  02/21/17 0739 02/21/17 1512       Patient was given sequential compression devices, early ambulation, and chemoprophylaxis to prevent DVT.   Patient benefited maximally from overnight outpatient stay and there were no complications.    Recent vital signs:  Patient Vitals for the past 24 hrs:  BP Temp Pulse Resp SpO2  02/22/17 0700 - - 76 18 96 %  02/22/17 0600 (!) 103/50 98.7 F (37.1 C) 71 18 96 %  02/22/17 0500 - - 75 18 97 %  02/22/17 0400 - - 71 18 96 %  02/22/17 0300 - - 76 18 97 %  02/22/17 0200 (!) 124/49 (!) 97 F (36.1 C) 66 18 98 %  02/22/17 0030 - - - 18 96 %  02/22/17 0000 - - 63 18 96 %  02/21/17 2330 - - 75 18 96 %  02/21/17 2200 (!) 129/54 97.9 F (36.6 C) 72 18 97 %  02/21/17 2100 - - - - 98 %  02/21/17 2000 - - - - 96 %  02/21/17 1929 - - - - 97 %  02/21/17 1900 - - - - 98 %  02/21/17 1800 (!) 117/41 (!) 96.8 F (36 C) 71 18 98 %  02/21/17 1700 - - - - 98 %  02/21/17 1630 - - 97 - 98 %  02/21/17 1539 (!) 122/55 - 66 - -  02/21/17  1430 126/68 98.9 F (37.2 C) 68 18 98 %  02/21/17 1345 - - - - 98 %  02/21/17 1330 124/60 (!) 97 F (36.1 C) 62 18 100 %  02/21/17 1200 (!) 130/51 (!) 97.5 F (36.4 C) 63 16 95 %  02/21/17 1130 - - - - 92 %  02/21/17 1100 (!) 139/59 (!) 96.5 F (35.8 C) 71 16 91 %  02/21/17 1015 (!) 141/66 - 71 19 95 %  02/21/17 1000 (!) 144/64 - 75 17 98 %  02/21/17 0945 (!) 146/62 - 70 18 100 %  02/21/17 0940 - - 66 16 100 %  02/21/17 0930 (!) 146/57 - 69 12 100 %  02/21/17 0915 (!) 146/60 97.9 F (36.6 C) 71 (!) 21 100 %  02/21/17 0900 133/77 - 81 19 95 %  02/21/17 0845 (!) 172/69 - 94 19 90 %  02/21/17 0841 (!) 159/73 - 86 (!) 35 100 %     Recent laboratory studies:  Recent Labs    02/21/17 0700 02/22/17 0500  WBC 9.7 13.0*  HGB 9.9* 8.8*  HCT 32.5* 28.3*  PLT 390 382  NA 136 139  K 4.9 4.5  CL 101 106  CO2 25 24  BUN 52* 39*  CREATININE 1.69* 1.13*  GLUCOSE 102* 121*  CALCIUM 8.9 8.5*     Discharge Medications:    Allergies as of 02/22/2017      Reactions   Palladium Chloride Other (See Comments)   Positive patch test    Statins Other (See Comments)   Atorvastatin, pravastatin, simvastatin->myalgias   Bacitracin Rash   Gold Sodium Thiosulfate Rash   Nickel Itching, Rash   Possibility...still trying to figure out      Medication List    TAKE these medications   alendronate 70 MG tablet Commonly known as:  FOSAMAX Take 70 mg by mouth once a week. Saturdays   ALPRAZolam 0.5 MG tablet Commonly known as:  XANAX Take 0.5 mg by mouth 2 (two) times daily as needed. Anxiety/sleep aid   aspirin 81 MG tablet Take 81 mg by mouth daily.   AZOR 5-40 MG tablet Generic drug:  amLODipine-olmesartan Take 1 tablet by mouth at bedtime.   citalopram 10 MG tablet Commonly known as:  CELEXA Take 10 mg by mouth daily.   docusate sodium 100 MG capsule Commonly known as:  COLACE Take 1 capsule (100 mg total) by mouth every 12 (twelve) hours.   HYDROcodone-acetaminophen 5-325 MG tablet Commonly known as:  NORCO/VICODIN Take 1-2 tablets by mouth every 4 (four) hours as needed for moderate pain.   metoprolol succinate 25 MG 24 hr tablet Commonly known as:  TOPROL-XL Take 25 mg by mouth daily.   pravastatin 40 MG tablet Commonly known as:  PRAVACHOL Take 40 mg by mouth 3 (three) times a week.   PROAIR HFA 108 (90 Base) MCG/ACT inhaler Generic drug:  albuterol Inhale 1 puff into the lungs every 4 (four) hours as needed for wheezing.   VITAMIN B-12 IJ Inject 1 mL as directed every 30 (thirty) days.       Diagnostic Studies: Dg Knee Right Port  Result Date: 02/06/2017 CLINICAL DATA:  Pain EXAM: PORTABLE RIGHT KNEE - 1-2 VIEW COMPARISON:  None. FINDINGS: Frontal and lateral views were obtained. No fracture or dislocation. There is a small joint effusion. There is extensive calcification in the suprapatellar bursa. There is extensive chondrocalcinosis. There is moderate joint space narrowing  medially and in the patellofemoral joint. No erosive  change. IMPRESSION: Widespread chondrocalcinosis. Chondrocalcinosis is noted in the suprapatellar bursa with a small joint effusion. This degree of chondrocalcinosis raises question of calcium pyrophosphate deposition disease which may present clinically as pseudogout. Note that chondrocalcinosis may also be seen with osteoarthritis. There is narrowing medially and in the patellofemoral joint regions. No erosive change evident. No fracture or dislocation. Electronically Signed   By: Lowella Grip III M.D.   On: 02/06/2017 13:50    Disposition: 01-Home or Self Care  Discharge Instructions    Diet - low sodium heart healthy   Complete by:  As directed    Discharge instructions   Complete by:  As directed    Knee arthroscopy Care After Refer to this sheet in the next few weeks. These discharge instructions provide you with general information on caring for yourself after you leave the hospital. Your caregiver may also give you specific instructions. Your treatment has been planned according to the most current medical practices available, but unavoidable complications sometimes occur. If you have any problems or questions after discharge, please call your caregiver. HOME INSTRUCTIONS You may resume a normal diet and activities as directed. Perform exercises as directed.  Take showers instead of baths until informed otherwise.  Change bandages (dressings) in 3 days.  Swab wounds daily with betadine.  Wash leg with soap and water.  Pat dry.  Cover wounds with bandaids. Only take over-the-counter or prescription medicines for pain, discomfort, or fever as directed by your caregiver.  Eat a well-balanced diet.  Avoid lifting or driving until you are instructed otherwise.  Make an appointment to see your caregiver for stitches (suture) or staple removal as directed.   SEEK MEDICAL CARE IF: You have swelling of your calf or leg.  You develop  shortness of breath or chest pain.  You have redness, swelling, or increasing pain in the wound.  There is pus or any unusual drainage coming from the surgical site.  You notice a bad smell coming from the surgical site or dressing.  The surgical site breaks open after sutures or staples have been removed.  There is persistent bleeding from the suture or staple line.  You are getting worse or are not improving.  You have any other questions or concerns.  SEEK IMMEDIATE MEDICAL CARE IF:  You have a fever.  You develop a rash.  You have difficulty breathing.  You develop any reaction or side effects to medicines given.  Your knee motion is decreasing rather than improving.  MAKE SURE YOU:  Understand these instructions.  Will watch your condition.  Will get help right away if you are not doing well or get worse.   Increase activity slowly   Complete by:  As directed       Follow-up Information    Campbell Riches, MD Follow up on 02/22/2017.   Specialty:  Infectious Diseases Why:  appointment time 9 am Contact information: [place] [address] [phone]        Elsie Saas, MD Follow up on 02/28/2017.   Specialty:  Orthopedic Surgery Why:  as scheduled Contact information: [address] [place]. [address] [place] [address] [phone]          Dr Johnnye Sima is seeing this patient to determine the best antibiotic treatment for this scant Pantoea Agglomerans that was non reactive to Ancef but susceptible to Rocephin and Levaquin. Patient has had large recurrent knee effusions that contain many calcium pyrophosphate crystals.  Patient has significant renal insufficiency which will play a part in her antibiotic choice.    SignedLinda Hedges 02/22/2017, 7:57 AM

## 2017-02-23 ENCOUNTER — Encounter (HOSPITAL_BASED_OUTPATIENT_CLINIC_OR_DEPARTMENT_OTHER): Payer: Self-pay | Admitting: Orthopedic Surgery

## 2017-02-26 LAB — AEROBIC/ANAEROBIC CULTURE W GRAM STAIN (SURGICAL/DEEP WOUND): Culture: NO GROWTH

## 2017-02-26 LAB — AEROBIC/ANAEROBIC CULTURE (SURGICAL/DEEP WOUND)

## 2017-02-27 ENCOUNTER — Telehealth: Payer: Self-pay | Admitting: Infectious Diseases

## 2017-02-28 DIAGNOSIS — N189 Chronic kidney disease, unspecified: Secondary | ICD-10-CM | POA: Diagnosis not present

## 2017-02-28 DIAGNOSIS — M009 Pyogenic arthritis, unspecified: Secondary | ICD-10-CM | POA: Diagnosis not present

## 2017-02-28 DIAGNOSIS — M00861 Arthritis due to other bacteria, right knee: Secondary | ICD-10-CM | POA: Diagnosis not present

## 2017-02-28 NOTE — Telephone Encounter (Signed)
error 

## 2017-03-06 DIAGNOSIS — I10 Essential (primary) hypertension: Secondary | ICD-10-CM | POA: Diagnosis not present

## 2017-03-06 DIAGNOSIS — R319 Hematuria, unspecified: Secondary | ICD-10-CM | POA: Diagnosis not present

## 2017-03-06 DIAGNOSIS — N181 Chronic kidney disease, stage 1: Secondary | ICD-10-CM | POA: Diagnosis not present

## 2017-03-06 DIAGNOSIS — Z79899 Other long term (current) drug therapy: Secondary | ICD-10-CM | POA: Diagnosis not present

## 2017-03-07 DIAGNOSIS — M00861 Arthritis due to other bacteria, right knee: Secondary | ICD-10-CM | POA: Diagnosis not present

## 2017-03-07 DIAGNOSIS — N179 Acute kidney failure, unspecified: Secondary | ICD-10-CM | POA: Diagnosis not present

## 2017-03-09 ENCOUNTER — Ambulatory Visit (INDEPENDENT_AMBULATORY_CARE_PROVIDER_SITE_OTHER): Payer: Medicare Other | Admitting: Infectious Diseases

## 2017-03-09 ENCOUNTER — Encounter: Payer: Self-pay | Admitting: Infectious Diseases

## 2017-03-09 DIAGNOSIS — K5900 Constipation, unspecified: Secondary | ICD-10-CM | POA: Diagnosis not present

## 2017-03-09 DIAGNOSIS — M00861 Arthritis due to other bacteria, right knee: Secondary | ICD-10-CM

## 2017-03-09 MED ORDER — SENNA-DOCUSATE SODIUM 8.6-50 MG PO TABS: 1.0000 | ORAL_TABLET | Freq: Every day | ORAL | 1 refills | Status: DC

## 2017-03-09 NOTE — Progress Notes (Signed)
   Subjective:    Patient ID: Mackenzie [name], female    DOB: 1940-02-16, 77 y.o.   MRN: [id number]  HPI 77 yo F with hx of R THR 2017, kyphooplasty done on L1 10-2015. Was being seen for hives at [facility], resolved ~ 1 yr ago.  She also states she has had injections in her R knee prior- synvisc and steroids.  She was seen by ortho on 12-21 for knee pain. 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 She was given intra-articular steroids.  She was in hospital 02-06-17 after she had persistent knee pain. . The Cx from her aspirate done in office at the dx of pseudogout showed P agglomerans. S- ceftriaxone, levaquin. R- Ancef.  She was seen by ortho and was not felt to have septic arthritis and was not in need of washout. She was d/c home same day.  She was seen in hospital on 02-21-17 with persistent pain and underwent chondroplasty, I & D of her R knee. She was d/c home this AM. Her Cx was (-) routine, AFB, fungal.  She was seen in ID on 02-23-16 and was continued on levaquin.  She states that her knee is doing great! Has not had any pain. Wound is well healed. Feels like her knee wants to pop when she walks. Is having muscle aches.   Labs at Dr Ria Comment office- Cr 1.06, Glc 111.   Review of Systems  Constitutional: Negative for chills and fever.  Gastrointestinal: Positive for constipation. Negative for diarrhea.  Genitourinary: Negative for difficulty urinating.  Musculoskeletal: Positive for myalgias. Negative for arthralgias and joint swelling.  Please see HPI. All other systems reviewed and negative.      Objective:   Physical Exam  Constitutional: She appears well-developed and well-nourished.  HENT:  Mouth/Throat: No oropharyngeal exudate.  Eyes: EOM are normal. Pupils are equal, round, and reactive to light.  Neck: Neck supple.  Cardiovascular: Normal rate, regular rhythm and normal heart sounds.  Pulmonary/Chest: Effort normal and breath sounds normal.  Abdominal: Soft.  Bowel sounds are normal. There is no tenderness. There is no rebound.  Musculoskeletal: She exhibits no edema.       Legs: Lymphadenopathy:    She has no cervical adenopathy.          Assessment & Plan:

## 2017-03-09 NOTE — Assessment & Plan Note (Signed)
She will continue on levaquin for 1 more month.  She is doing well.  It is unclear if the Pantoea was a true infection or a contaminant (not found at second cx).  I will see her back prn.

## 2017-03-09 NOTE — Assessment & Plan Note (Signed)
She does not have a Hx of CHF- advised her to drink 8 glasses of water a day. Has tried stool softener (colace?) She feels bloated, would like to try an irritant laxative (peri-colace).

## 2017-03-14 DIAGNOSIS — M009 Pyogenic arthritis, unspecified: Secondary | ICD-10-CM | POA: Diagnosis not present

## 2017-03-23 LAB — FUNGAL ORGANISM REFLEX

## 2017-03-23 LAB — FUNGUS CULTURE WITH STAIN

## 2017-03-23 LAB — FUNGUS CULTURE RESULT

## 2017-04-03 DIAGNOSIS — E785 Hyperlipidemia, unspecified: Secondary | ICD-10-CM | POA: Diagnosis not present

## 2017-04-03 DIAGNOSIS — I10 Essential (primary) hypertension: Secondary | ICD-10-CM | POA: Diagnosis not present

## 2017-04-03 DIAGNOSIS — Z79899 Other long term (current) drug therapy: Secondary | ICD-10-CM | POA: Diagnosis not present

## 2017-04-05 LAB — ACID FAST CULTURE WITH REFLEXED SENSITIVITIES

## 2017-04-05 LAB — ACID FAST CULTURE WITH REFLEXED SENSITIVITIES (MYCOBACTERIA): Acid Fast Culture: NEGATIVE

## 2017-04-10 DIAGNOSIS — R21 Rash and other nonspecific skin eruption: Secondary | ICD-10-CM | POA: Diagnosis not present

## 2017-04-10 DIAGNOSIS — M25461 Effusion, right knee: Secondary | ICD-10-CM | POA: Diagnosis not present

## 2017-04-10 DIAGNOSIS — N179 Acute kidney failure, unspecified: Secondary | ICD-10-CM | POA: Diagnosis not present

## 2017-04-10 DIAGNOSIS — L509 Urticaria, unspecified: Secondary | ICD-10-CM | POA: Diagnosis not present

## 2017-04-10 DIAGNOSIS — Z6826 Body mass index (BMI) 26.0-26.9, adult: Secondary | ICD-10-CM | POA: Diagnosis not present

## 2017-04-10 DIAGNOSIS — E663 Overweight: Secondary | ICD-10-CM | POA: Diagnosis not present

## 2017-04-10 DIAGNOSIS — M1120 Other chondrocalcinosis, unspecified site: Secondary | ICD-10-CM | POA: Diagnosis not present

## 2017-04-10 DIAGNOSIS — M109 Gout, unspecified: Secondary | ICD-10-CM | POA: Diagnosis not present

## 2017-04-10 DIAGNOSIS — I10 Essential (primary) hypertension: Secondary | ICD-10-CM | POA: Diagnosis not present

## 2017-04-18 DIAGNOSIS — M009 Pyogenic arthritis, unspecified: Secondary | ICD-10-CM | POA: Diagnosis not present

## 2017-04-18 DIAGNOSIS — M25561 Pain in right knee: Secondary | ICD-10-CM | POA: Diagnosis not present

## 2017-05-11 DIAGNOSIS — M109 Gout, unspecified: Secondary | ICD-10-CM | POA: Diagnosis not present

## 2017-05-11 DIAGNOSIS — R030 Elevated blood-pressure reading, without diagnosis of hypertension: Secondary | ICD-10-CM | POA: Diagnosis not present

## 2017-05-11 DIAGNOSIS — L509 Urticaria, unspecified: Secondary | ICD-10-CM | POA: Diagnosis not present

## 2017-05-16 DIAGNOSIS — L509 Urticaria, unspecified: Secondary | ICD-10-CM | POA: Diagnosis not present

## 2017-05-23 DIAGNOSIS — M109 Gout, unspecified: Secondary | ICD-10-CM | POA: Diagnosis not present

## 2017-05-23 DIAGNOSIS — L509 Urticaria, unspecified: Secondary | ICD-10-CM | POA: Diagnosis not present

## 2017-05-23 DIAGNOSIS — Z6825 Body mass index (BMI) 25.0-25.9, adult: Secondary | ICD-10-CM | POA: Diagnosis not present

## 2017-05-23 DIAGNOSIS — M25461 Effusion, right knee: Secondary | ICD-10-CM | POA: Diagnosis not present

## 2017-05-23 DIAGNOSIS — E663 Overweight: Secondary | ICD-10-CM | POA: Diagnosis not present

## 2017-05-23 DIAGNOSIS — R21 Rash and other nonspecific skin eruption: Secondary | ICD-10-CM | POA: Diagnosis not present

## 2017-07-07 DIAGNOSIS — E790 Hyperuricemia without signs of inflammatory arthritis and tophaceous disease: Secondary | ICD-10-CM | POA: Diagnosis not present

## 2017-07-25 DIAGNOSIS — Z87440 Personal history of urinary (tract) infections: Secondary | ICD-10-CM | POA: Diagnosis not present

## 2017-07-25 DIAGNOSIS — M109 Gout, unspecified: Secondary | ICD-10-CM | POA: Diagnosis not present

## 2017-07-25 DIAGNOSIS — I10 Essential (primary) hypertension: Secondary | ICD-10-CM | POA: Diagnosis not present

## 2017-08-07 ENCOUNTER — Other Ambulatory Visit (HOSPITAL_COMMUNITY): Payer: Self-pay | Admitting: Orthopedic Surgery

## 2017-08-07 DIAGNOSIS — M79604 Pain in right leg: Secondary | ICD-10-CM

## 2017-08-07 DIAGNOSIS — M25561 Pain in right knee: Secondary | ICD-10-CM | POA: Diagnosis not present

## 2017-08-07 DIAGNOSIS — M009 Pyogenic arthritis, unspecified: Secondary | ICD-10-CM | POA: Diagnosis not present

## 2017-08-08 ENCOUNTER — Ambulatory Visit (HOSPITAL_COMMUNITY)
Admission: RE | Admit: 2017-08-08 | Discharge: 2017-08-08 | Disposition: A | Discharge status: Home / Self Care | Payer: Medicare Other | Source: Ambulatory Visit | Attending: Orthopedic Surgery | Admitting: Orthopedic Surgery

## 2017-08-08 DIAGNOSIS — M79604 Pain in right leg: Secondary | ICD-10-CM | POA: Insufficient documentation

## 2017-08-08 DIAGNOSIS — M79661 Pain in right lower leg: Secondary | ICD-10-CM | POA: Diagnosis not present

## 2017-08-29 DIAGNOSIS — R21 Rash and other nonspecific skin eruption: Secondary | ICD-10-CM | POA: Diagnosis not present

## 2017-08-29 DIAGNOSIS — Z6826 Body mass index (BMI) 26.0-26.9, adult: Secondary | ICD-10-CM | POA: Diagnosis not present

## 2017-08-29 DIAGNOSIS — H35372 Puckering of macula, left eye: Secondary | ICD-10-CM | POA: Diagnosis not present

## 2017-08-29 DIAGNOSIS — L509 Urticaria, unspecified: Secondary | ICD-10-CM | POA: Diagnosis not present

## 2017-08-29 DIAGNOSIS — M7989 Other specified soft tissue disorders: Secondary | ICD-10-CM | POA: Diagnosis not present

## 2017-08-29 DIAGNOSIS — E663 Overweight: Secondary | ICD-10-CM | POA: Diagnosis not present

## 2017-08-29 DIAGNOSIS — M25461 Effusion, right knee: Secondary | ICD-10-CM | POA: Diagnosis not present

## 2017-08-29 DIAGNOSIS — M109 Gout, unspecified: Secondary | ICD-10-CM | POA: Diagnosis not present

## 2017-08-29 DIAGNOSIS — Z961 Presence of intraocular lens: Secondary | ICD-10-CM | POA: Diagnosis not present

## 2017-10-17 DIAGNOSIS — S83241D Other tear of medial meniscus, current injury, right knee, subsequent encounter: Secondary | ICD-10-CM | POA: Diagnosis not present

## 2017-12-14 DIAGNOSIS — M109 Gout, unspecified: Secondary | ICD-10-CM | POA: Diagnosis not present

## 2017-12-14 DIAGNOSIS — I10 Essential (primary) hypertension: Secondary | ICD-10-CM | POA: Diagnosis not present

## 2017-12-14 DIAGNOSIS — Z79899 Other long term (current) drug therapy: Secondary | ICD-10-CM | POA: Diagnosis not present

## 2017-12-18 ENCOUNTER — Other Ambulatory Visit (HOSPITAL_COMMUNITY): Payer: Self-pay | Admitting: Internal Medicine

## 2017-12-18 ENCOUNTER — Ambulatory Visit (HOSPITAL_COMMUNITY)
Admission: RE | Admit: 2017-12-18 | Discharge: 2017-12-18 | Disposition: A | Discharge status: Home / Self Care | Payer: Medicare Other | Source: Ambulatory Visit | Attending: Internal Medicine | Admitting: Internal Medicine

## 2017-12-18 DIAGNOSIS — M545 Low back pain, unspecified: Secondary | ICD-10-CM

## 2017-12-18 DIAGNOSIS — Z23 Encounter for immunization: Secondary | ICD-10-CM | POA: Diagnosis not present

## 2017-12-18 DIAGNOSIS — M47816 Spondylosis without myelopathy or radiculopathy, lumbar region: Secondary | ICD-10-CM | POA: Diagnosis not present

## 2017-12-18 DIAGNOSIS — I700 Atherosclerosis of aorta: Secondary | ICD-10-CM | POA: Diagnosis not present

## 2017-12-18 DIAGNOSIS — M5416 Radiculopathy, lumbar region: Secondary | ICD-10-CM | POA: Diagnosis not present

## 2017-12-18 DIAGNOSIS — M5136 Other intervertebral disc degeneration, lumbar region: Secondary | ICD-10-CM | POA: Diagnosis not present

## 2017-12-25 ENCOUNTER — Other Ambulatory Visit: Payer: Self-pay | Admitting: Internal Medicine

## 2017-12-25 ENCOUNTER — Other Ambulatory Visit (HOSPITAL_COMMUNITY): Payer: Self-pay | Admitting: Internal Medicine

## 2017-12-25 DIAGNOSIS — M5416 Radiculopathy, lumbar region: Secondary | ICD-10-CM

## 2017-12-27 ENCOUNTER — Ambulatory Visit (HOSPITAL_COMMUNITY)
Admission: RE | Admit: 2017-12-27 | Discharge: 2017-12-27 | Disposition: A | Discharge status: Home / Self Care | Payer: Medicare Other | Source: Ambulatory Visit | Attending: Internal Medicine | Admitting: Internal Medicine

## 2017-12-27 DIAGNOSIS — M48061 Spinal stenosis, lumbar region without neurogenic claudication: Secondary | ICD-10-CM | POA: Diagnosis not present

## 2017-12-27 DIAGNOSIS — M5116 Intervertebral disc disorders with radiculopathy, lumbar region: Secondary | ICD-10-CM | POA: Insufficient documentation

## 2017-12-27 DIAGNOSIS — M5416 Radiculopathy, lumbar region: Secondary | ICD-10-CM | POA: Diagnosis present

## 2017-12-27 DIAGNOSIS — M4856XA Collapsed vertebra, not elsewhere classified, lumbar region, initial encounter for fracture: Secondary | ICD-10-CM | POA: Diagnosis not present

## 2017-12-27 DIAGNOSIS — M5117 Intervertebral disc disorders with radiculopathy, lumbosacral region: Secondary | ICD-10-CM | POA: Diagnosis not present

## 2018-01-05 ENCOUNTER — Other Ambulatory Visit: Payer: Self-pay | Admitting: Neurosurgery

## 2018-01-05 DIAGNOSIS — M5126 Other intervertebral disc displacement, lumbar region: Secondary | ICD-10-CM | POA: Diagnosis not present

## 2018-01-05 DIAGNOSIS — M47816 Spondylosis without myelopathy or radiculopathy, lumbar region: Secondary | ICD-10-CM | POA: Diagnosis not present

## 2018-01-05 DIAGNOSIS — M5136 Other intervertebral disc degeneration, lumbar region: Secondary | ICD-10-CM | POA: Diagnosis not present

## 2018-01-05 DIAGNOSIS — M5416 Radiculopathy, lumbar region: Secondary | ICD-10-CM | POA: Diagnosis not present

## 2018-01-17 ENCOUNTER — Other Ambulatory Visit: Payer: Self-pay | Admitting: Neurosurgery

## 2018-01-18 NOTE — Progress Notes (Addendum)
PCP: Asencion Noble, MD  Cardiologist: [facility]-patient cannot remember her doctor's name  EKG: 02/06/17 in EPIC  Stress test: 02/01/12  ECHO: 02/01/12 in EPIC  Cardiac Cath: 03/04/15 in EPIC  Chest x-ray: pt denies past year, no recent respiratory infections/complications  Pt advised to begin holding aspirin 01/13/18 per MD and is currently holding until surgery

## 2018-01-18 NOTE — Pre-Procedure Instructions (Signed)
Mackenzie [name]  01/18/2018      [facility] - [place] [address] [place] [address] Phone: [phone] Fax: [phone]  [place] ([place] [address] [place] [address] Phone: [phone] Fax: [phone]    Your procedure is scheduled on January 24, 2018.  Report to [facility] Admitting at 1150 AM.  Call this number if you have problems the morning of surgery:  [phone]   Remember:  Do not eat or drink after midnight.    Take these medicines the morning of surgery with A SIP OF WATER  Metoprolol succinate (toprol XL) Allopurinol (zyloprim) Alprazolam (xanax)- if needed Citalopram (celexa) Albuterol inhaler -if needed-please bring inhaler with you the morning of surgery  Follow your surgeon's instructions on when to hold/resume aspirin.  If no instructions were given call the office to determine how they would like to you take aspirin    7 days prior to surgery STOP taking any Aleve, Naproxen, Ibuprofen, Motrin, Advil, Goody's, BC's, all herbal medications, fish oil, and all vitamins   Do not wear jewelry, make-up or nail polish.  Do not wear lotions, powders, or perfumes, or deodorant.  Do not shave 48 hours prior to surgery.    Do not bring valuables to the hospital.  [facility] is not responsible for any belongings or valuables.  Contacts, dentures or bridgework may not be worn into surgery.  Leave your suitcase in the car.  After surgery it may be brought to your room.  For patients admitted to the hospital, discharge time will be determined by your treatment team.  Patients discharged the day of surgery will not be allowed to drive home.    [place]- Preparing For Surgery  Before surgery, you can play an important role. Because skin is not sterile, your skin needs to be as free of germs as possible. You can reduce the number  of germs on your skin by washing with CHG (chlorahexidine gluconate) Soap before surgery.  CHG is an antiseptic cleaner which kills germs and bonds with the skin to continue killing germs even after washing.    Oral Hygiene is also important to reduce your risk of infection.  Remember - BRUSH YOUR TEETH THE MORNING OF SURGERY WITH YOUR REGULAR TOOTHPASTE  Please do not use if you have an allergy to CHG or antibacterial soaps. If your skin becomes reddened/irritated stop using the CHG.  Do not shave (including legs and underarms) for at least 48 hours prior to first CHG shower. It is OK to shave your face.  Please follow these instructions carefully.   1. Shower the NIGHT BEFORE SURGERY and the MORNING OF SURGERY with CHG.   2. If you chose to wash your hair, wash your hair first as usual with your normal shampoo.  3. After you shampoo, rinse your hair and body thoroughly to remove the shampoo.  4. Use CHG as you would any other liquid soap. You can apply CHG directly to the skin and wash gently with a scrungie or a clean washcloth.   5. Apply the CHG Soap to your body ONLY FROM THE NECK DOWN.  Do not use on open wounds or open sores. Avoid contact with your eyes, ears, mouth and genitals (private parts). Wash Face and genitals (private parts)  with your normal soap.  6. Wash thoroughly, paying special attention to the area where your  surgery will be performed.  7. Thoroughly rinse your body with warm water from the neck down.  8. DO NOT shower/wash with your normal soap after using and rinsing off the CHG Soap.  9. Pat yourself dry with a CLEAN TOWEL.  10. Wear CLEAN PAJAMAS to bed the night before surgery, wear comfortable clothes the morning of surgery  11. Place CLEAN SHEETS on your bed the night of your first shower and DO NOT SLEEP WITH PETS.  Day of Surgery:  Do not apply any deodorants/lotions.  Please wear clean clothes to the hospital/surgery center.   Remember to brush  your teeth WITH YOUR REGULAR TOOTHPASTE.  Please read over the fact sheets that you were given.

## 2018-01-19 ENCOUNTER — Other Ambulatory Visit: Payer: Self-pay

## 2018-01-19 ENCOUNTER — Encounter (HOSPITAL_COMMUNITY)
Admission: RE | Admit: 2018-01-19 | Discharge: 2018-01-19 | Disposition: A | Discharge status: Home / Self Care | Payer: Medicare Other | Source: Ambulatory Visit | Attending: Neurosurgery | Admitting: Neurosurgery

## 2018-01-19 ENCOUNTER — Encounter (HOSPITAL_COMMUNITY): Payer: Self-pay

## 2018-01-19 DIAGNOSIS — Z01812 Encounter for preprocedural laboratory examination: Secondary | ICD-10-CM | POA: Insufficient documentation

## 2018-01-19 LAB — BASIC METABOLIC PANEL
Anion gap: 12 (ref 5–15)
BUN: 25 mg/dL — ABNORMAL HIGH (ref 8–23)
CHLORIDE: 103 mmol/L (ref 98–111)
CO2: 24 mmol/L (ref 22–32)
Calcium: 9.6 mg/dL (ref 8.9–10.3)
Creatinine, Ser: 1.06 mg/dL — ABNORMAL HIGH (ref 0.44–1.00)
GFR calc Af Amer: 58 mL/min — ABNORMAL LOW (ref >60)
GFR calc non Af Amer: 50 mL/min — ABNORMAL LOW (ref >60)
Glucose, Bld: 114 mg/dL — ABNORMAL HIGH (ref 70–99)
Potassium: 3.8 mmol/L (ref 3.5–5.1)
Sodium: 139 mmol/L (ref 135–145)

## 2018-01-19 LAB — CBC
HCT: 37.5 % (ref 36.0–46.0)
Hemoglobin: 11.6 g/dL — ABNORMAL LOW (ref 12.0–15.0)
MCH: 30.7 pg (ref 26.0–34.0)
MCHC: 30.9 g/dL (ref 30.0–36.0)
MCV: 99.2 fL (ref 80.0–100.0)
Platelets: 428 10*3/uL — ABNORMAL HIGH (ref 150–400)
RBC: 3.78 MIL/uL — ABNORMAL LOW (ref 3.87–5.11)
RDW: 12.4 % (ref 11.5–15.5)
WBC: 8 10*3/uL (ref 4.0–10.5)
nRBC: 0 % (ref 0.0–0.2)

## 2018-01-19 LAB — SURGICAL PCR SCREEN
MRSA, PCR: NEGATIVE
Staphylococcus aureus: NEGATIVE

## 2018-01-24 ENCOUNTER — Ambulatory Visit (HOSPITAL_COMMUNITY): Payer: Medicare Other | Admitting: Physician Assistant

## 2018-01-24 ENCOUNTER — Ambulatory Visit (HOSPITAL_COMMUNITY): Payer: Medicare Other

## 2018-01-24 ENCOUNTER — Encounter (HOSPITAL_COMMUNITY)
Admission: RE | Disposition: A | Discharge status: Home / Self Care | Payer: Self-pay | Source: Ambulatory Visit | Attending: Neurosurgery

## 2018-01-24 ENCOUNTER — Encounter (HOSPITAL_COMMUNITY): Payer: Self-pay | Admitting: Anesthesiology

## 2018-01-24 ENCOUNTER — Ambulatory Visit (HOSPITAL_COMMUNITY): Payer: Medicare Other | Admitting: Anesthesiology

## 2018-01-24 ENCOUNTER — Observation Stay (HOSPITAL_COMMUNITY)
Admission: RE | Admit: 2018-01-24 | Discharge: 2018-01-25 | Disposition: A | Discharge status: Home / Self Care | Payer: Medicare Other | Source: Ambulatory Visit | Attending: Neurosurgery | Admitting: Neurosurgery

## 2018-01-24 DIAGNOSIS — I2510 Atherosclerotic heart disease of native coronary artery without angina pectoris: Secondary | ICD-10-CM | POA: Insufficient documentation

## 2018-01-24 DIAGNOSIS — M5116 Intervertebral disc disorders with radiculopathy, lumbar region: Principal | ICD-10-CM | POA: Insufficient documentation

## 2018-01-24 DIAGNOSIS — Z888 Allergy status to other drugs, medicaments and biological substances status: Secondary | ICD-10-CM | POA: Insufficient documentation

## 2018-01-24 DIAGNOSIS — M1990 Unspecified osteoarthritis, unspecified site: Secondary | ICD-10-CM | POA: Insufficient documentation

## 2018-01-24 DIAGNOSIS — M5126 Other intervertebral disc displacement, lumbar region: Secondary | ICD-10-CM | POA: Diagnosis not present

## 2018-01-24 DIAGNOSIS — Z87891 Personal history of nicotine dependence: Secondary | ICD-10-CM | POA: Insufficient documentation

## 2018-01-24 DIAGNOSIS — Z7982 Long term (current) use of aspirin: Secondary | ICD-10-CM | POA: Insufficient documentation

## 2018-01-24 DIAGNOSIS — E785 Hyperlipidemia, unspecified: Secondary | ICD-10-CM | POA: Diagnosis not present

## 2018-01-24 DIAGNOSIS — Z881 Allergy status to other antibiotic agents status: Secondary | ICD-10-CM | POA: Diagnosis not present

## 2018-01-24 DIAGNOSIS — I10 Essential (primary) hypertension: Secondary | ICD-10-CM | POA: Insufficient documentation

## 2018-01-24 DIAGNOSIS — Z79899 Other long term (current) drug therapy: Secondary | ICD-10-CM | POA: Insufficient documentation

## 2018-01-24 DIAGNOSIS — Z419 Encounter for procedure for purposes other than remedying health state, unspecified: Secondary | ICD-10-CM

## 2018-01-24 DIAGNOSIS — Z981 Arthrodesis status: Secondary | ICD-10-CM | POA: Diagnosis not present

## 2018-01-24 DIAGNOSIS — M4726 Other spondylosis with radiculopathy, lumbar region: Secondary | ICD-10-CM | POA: Diagnosis not present

## 2018-01-24 DIAGNOSIS — F419 Anxiety disorder, unspecified: Secondary | ICD-10-CM | POA: Insufficient documentation

## 2018-01-24 DIAGNOSIS — F329 Major depressive disorder, single episode, unspecified: Secondary | ICD-10-CM | POA: Insufficient documentation

## 2018-01-24 DIAGNOSIS — I25119 Atherosclerotic heart disease of native coronary artery with unspecified angina pectoris: Secondary | ICD-10-CM | POA: Diagnosis not present

## 2018-01-24 HISTORY — PX: LUMBAR LAMINECTOMY/DECOMPRESSION MICRODISCECTOMY: SHX5026

## 2018-01-24 SURGERY — LUMBAR LAMINECTOMY/DECOMPRESSION MICRODISCECTOMY 1 LEVEL
Anesthesia: General | Site: Spine Lumbar | Laterality: Left

## 2018-01-24 MED ORDER — HYDROXYZINE HCL 25 MG PO TABS: 50.0000 mg | ORAL_TABLET | ORAL | Status: DC | PRN

## 2018-01-24 MED ORDER — FENTANYL CITRATE (PF) 100 MCG/2ML IJ SOLN
INTRAMUSCULAR | Status: DC | PRN
  Administered 2018-01-24 (×3): 50 ug via INTRAVENOUS

## 2018-01-24 MED ORDER — HYDROXYZINE HCL 50 MG/ML IM SOLN: 50.0000 mg | INTRAMUSCULAR | Status: DC | PRN

## 2018-01-24 MED ORDER — LIDOCAINE-EPINEPHRINE 1 %-1:100000 IJ SOLN
INTRAMUSCULAR | Status: AC
  Filled 2018-01-24: qty 1

## 2018-01-24 MED ORDER — PRAVASTATIN SODIUM 40 MG PO TABS: 40.0000 mg | ORAL_TABLET | Freq: Every day | ORAL | Status: DC

## 2018-01-24 MED ORDER — LACTATED RINGERS IV SOLN
Freq: Once | INTRAVENOUS | Status: AC
  Administered 2018-01-24: 12:00:00 via INTRAVENOUS

## 2018-01-24 MED ORDER — KCL IN DEXTROSE-NACL 20-5-0.45 MEQ/L-%-% IV SOLN: INTRAVENOUS | Status: DC

## 2018-01-24 MED ORDER — CEFAZOLIN SODIUM-DEXTROSE 2-4 GM/100ML-% IV SOLN
2.0000 g | INTRAVENOUS | Status: AC
  Administered 2018-01-24: 2 g via INTRAVENOUS

## 2018-01-24 MED ORDER — ALLOPURINOL 100 MG PO TABS
200.0000 mg | ORAL_TABLET | Freq: Every day | ORAL | Status: DC
  Filled 2018-01-24 (×2): qty 2

## 2018-01-24 MED ORDER — ACETAMINOPHEN 325 MG PO TABS: 650.0000 mg | ORAL_TABLET | ORAL | Status: DC | PRN

## 2018-01-24 MED ORDER — LIDOCAINE 2% (20 MG/ML) 5 ML SYRINGE
INTRAMUSCULAR | Status: AC
  Filled 2018-01-24: qty 5

## 2018-01-24 MED ORDER — EPHEDRINE SULFATE-NACL 50-0.9 MG/10ML-% IV SOSY
PREFILLED_SYRINGE | INTRAVENOUS | Status: DC | PRN
  Administered 2018-01-24: 15 mg via INTRAVENOUS

## 2018-01-24 MED ORDER — DEXAMETHASONE SODIUM PHOSPHATE 4 MG/ML IJ SOLN
INTRAMUSCULAR | Status: DC | PRN
  Administered 2018-01-24: 10 mg via INTRAVENOUS

## 2018-01-24 MED ORDER — BUPIVACAINE HCL (PF) 0.5 % IJ SOLN
INTRAMUSCULAR | Status: DC | PRN
  Administered 2018-01-24: 10 mL

## 2018-01-24 MED ORDER — ONDANSETRON HCL 4 MG/2ML IJ SOLN
INTRAMUSCULAR | Status: AC
  Filled 2018-01-24: qty 2

## 2018-01-24 MED ORDER — CITALOPRAM HYDROBROMIDE 10 MG PO TABS
10.0000 mg | ORAL_TABLET | Freq: Every day | ORAL | Status: DC
  Filled 2018-01-24 (×2): qty 1

## 2018-01-24 MED ORDER — HYDROCODONE-ACETAMINOPHEN 5-325 MG PO TABS
1.0000 | ORAL_TABLET | ORAL | Status: DC | PRN
  Administered 2018-01-24 – 2018-01-25 (×4): 1 via ORAL
  Filled 2018-01-24 (×4): qty 1

## 2018-01-24 MED ORDER — LIDOCAINE-EPINEPHRINE 1 %-1:100000 IJ SOLN
INTRAMUSCULAR | Status: DC | PRN
  Administered 2018-01-24: 10 mL

## 2018-01-24 MED ORDER — PROPOFOL 10 MG/ML IV BOLUS
INTRAVENOUS | Status: DC | PRN
  Administered 2018-01-24: 140 mg via INTRAVENOUS

## 2018-01-24 MED ORDER — METHYLPREDNISOLONE ACETATE 80 MG/ML IJ SUSP
INTRAMUSCULAR | Status: DC | PRN
  Administered 2018-01-24: 80 mg

## 2018-01-24 MED ORDER — DEXAMETHASONE SODIUM PHOSPHATE 10 MG/ML IJ SOLN
INTRAMUSCULAR | Status: AC
  Filled 2018-01-24: qty 1

## 2018-01-24 MED ORDER — SODIUM CHLORIDE 0.9% FLUSH: 3.0000 mL | INTRAVENOUS | Status: DC | PRN

## 2018-01-24 MED ORDER — FENTANYL CITRATE (PF) 250 MCG/5ML IJ SOLN
INTRAMUSCULAR | Status: AC
  Filled 2018-01-24: qty 5

## 2018-01-24 MED ORDER — BISACODYL 10 MG RE SUPP: 10.0000 mg | Freq: Every day | RECTAL | Status: DC | PRN

## 2018-01-24 MED ORDER — METOPROLOL SUCCINATE ER 25 MG PO TB24: 25.0000 mg | ORAL_TABLET | Freq: Every day | ORAL | Status: DC

## 2018-01-24 MED ORDER — ONDANSETRON HCL 4 MG/2ML IJ SOLN: 4.0000 mg | Freq: Once | INTRAMUSCULAR | Status: DC | PRN

## 2018-01-24 MED ORDER — FLEET ENEMA 7-19 GM/118ML RE ENEM: 1.0000 | ENEMA | Freq: Once | RECTAL | Status: DC | PRN

## 2018-01-24 MED ORDER — MAGNESIUM HYDROXIDE 400 MG/5ML PO SUSP: 30.0000 mL | Freq: Every day | ORAL | Status: DC | PRN

## 2018-01-24 MED ORDER — PHENOL 1.4 % MT LIQD: 1.0000 | OROMUCOSAL | Status: DC | PRN

## 2018-01-24 MED ORDER — BUPIVACAINE HCL (PF) 0.5 % IJ SOLN
INTRAMUSCULAR | Status: AC
  Filled 2018-01-24: qty 30

## 2018-01-24 MED ORDER — CHLORHEXIDINE GLUCONATE CLOTH 2 % EX PADS: 6.0000 | MEDICATED_PAD | Freq: Once | CUTANEOUS | Status: DC

## 2018-01-24 MED ORDER — FENTANYL CITRATE (PF) 100 MCG/2ML IJ SOLN
INTRAMUSCULAR | Status: AC
  Filled 2018-01-24: qty 2

## 2018-01-24 MED ORDER — 0.9 % SODIUM CHLORIDE (POUR BTL) OPTIME
TOPICAL | Status: DC | PRN
  Administered 2018-01-24: 1000 mL

## 2018-01-24 MED ORDER — ACETAMINOPHEN 650 MG RE SUPP: 650.0000 mg | RECTAL | Status: DC | PRN

## 2018-01-24 MED ORDER — THROMBIN 5000 UNITS EX SOLR
CUTANEOUS | Status: AC
  Filled 2018-01-24: qty 10000

## 2018-01-24 MED ORDER — IRBESARTAN 300 MG PO TABS
300.0000 mg | ORAL_TABLET | Freq: Every day | ORAL | Status: DC
  Filled 2018-01-24 (×2): qty 1

## 2018-01-24 MED ORDER — AMLODIPINE BESYLATE-VALSARTAN 5-320 MG PO TABS: 1.0000 | ORAL_TABLET | Freq: Every day | ORAL | Status: DC

## 2018-01-24 MED ORDER — ALUM & MAG HYDROXIDE-SIMETH 200-200-20 MG/5ML PO SUSP: 30.0000 mL | Freq: Four times a day (QID) | ORAL | Status: DC | PRN

## 2018-01-24 MED ORDER — CYCLOBENZAPRINE HCL 5 MG PO TABS: 5.0000 mg | ORAL_TABLET | Freq: Three times a day (TID) | ORAL | Status: DC | PRN

## 2018-01-24 MED ORDER — SODIUM CHLORIDE 0.9 % IV SOLN: 250.0000 mL | INTRAVENOUS | Status: DC

## 2018-01-24 MED ORDER — HEMOSTATIC AGENTS (NO CHARGE) OPTIME
TOPICAL | Status: DC | PRN
  Administered 2018-01-24: 1 via TOPICAL

## 2018-01-24 MED ORDER — LACTATED RINGERS IV SOLN
INTRAVENOUS | Status: DC | PRN
  Administered 2018-01-24: 12:00:00 via INTRAVENOUS

## 2018-01-24 MED ORDER — THROMBIN 5000 UNITS EX SOLR
CUTANEOUS | Status: DC | PRN
  Administered 2018-01-24 (×2): 5000 [IU] via TOPICAL

## 2018-01-24 MED ORDER — FENTANYL CITRATE (PF) 100 MCG/2ML IJ SOLN: 25.0000 ug | INTRAMUSCULAR | Status: DC | PRN

## 2018-01-24 MED ORDER — MORPHINE SULFATE (PF) 4 MG/ML IV SOLN: 4.0000 mg | INTRAVENOUS | Status: DC | PRN

## 2018-01-24 MED ORDER — SUGAMMADEX SODIUM 200 MG/2ML IV SOLN
INTRAVENOUS | Status: DC | PRN
  Administered 2018-01-24: 200 mg via INTRAVENOUS

## 2018-01-24 MED ORDER — MENTHOL 3 MG MT LOZG: 1.0000 | LOZENGE | OROMUCOSAL | Status: DC | PRN

## 2018-01-24 MED ORDER — METHYLPREDNISOLONE ACETATE 80 MG/ML IJ SUSP
INTRAMUSCULAR | Status: AC
  Filled 2018-01-24: qty 1

## 2018-01-24 MED ORDER — CHLORTHALIDONE 25 MG PO TABS
25.0000 mg | ORAL_TABLET | Freq: Every day | ORAL | Status: DC
  Filled 2018-01-24 (×2): qty 1

## 2018-01-24 MED ORDER — CEFAZOLIN SODIUM 1 G IJ SOLR
INTRAMUSCULAR | Status: AC
  Filled 2018-01-24: qty 20

## 2018-01-24 MED ORDER — ONDANSETRON HCL 4 MG/2ML IJ SOLN
INTRAMUSCULAR | Status: DC | PRN
  Administered 2018-01-24: 4 mg via INTRAVENOUS

## 2018-01-24 MED ORDER — SODIUM CHLORIDE 0.9% FLUSH: 3.0000 mL | Freq: Two times a day (BID) | INTRAVENOUS | Status: DC

## 2018-01-24 MED ORDER — LIDOCAINE 2% (20 MG/ML) 5 ML SYRINGE
INTRAMUSCULAR | Status: DC | PRN
  Administered 2018-01-24: 60 mg via INTRAVENOUS

## 2018-01-24 MED ORDER — FENTANYL CITRATE (PF) 100 MCG/2ML IJ SOLN
INTRAMUSCULAR | Status: DC | PRN
  Administered 2018-01-24: 100 ug via INTRAVENOUS

## 2018-01-24 MED ORDER — AMLODIPINE BESYLATE 5 MG PO TABS: 5.0000 mg | ORAL_TABLET | Freq: Every day | ORAL | Status: DC

## 2018-01-24 MED ORDER — ROCURONIUM BROMIDE 50 MG/5ML IV SOSY
PREFILLED_SYRINGE | INTRAVENOUS | Status: AC
  Filled 2018-01-24: qty 5

## 2018-01-24 SURGICAL SUPPLY — 57 items
ADH SKN CLS APL DERMABOND .7 (GAUZE/BANDAGES/DRESSINGS) ×1
BAG DECANTER FOR FLEXI CONT (MISCELLANEOUS) ×3 IMPLANT
BUR ACRON 5.0MM COATED (BURR) ×2 IMPLANT
BUR MATCHSTICK NEURO 3.0 LAGG (BURR) ×3 IMPLANT
CANISTER SUCT 3000ML PPV (MISCELLANEOUS) ×3 IMPLANT
CARTRIDGE OIL MAESTRO DRILL (MISCELLANEOUS) ×1 IMPLANT
CLOSURE WOUND 1/2 X4 (GAUZE/BANDAGES/DRESSINGS)
COVER WAND RF STERILE (DRAPES) ×3 IMPLANT
DERMABOND ADVANCED (GAUZE/BANDAGES/DRESSINGS) ×2
DERMABOND ADVANCED .7 DNX12 (GAUZE/BANDAGES/DRESSINGS) ×1 IMPLANT
DIFFUSER DRILL AIR PNEUMATIC (MISCELLANEOUS) ×3 IMPLANT
DRAPE LAPAROTOMY 100X72X124 (DRAPES) ×3 IMPLANT
DRAPE MICROSCOPE LEICA (MISCELLANEOUS) ×3 IMPLANT
DRAPE POUCH INSTRU U-SHP 10X18 (DRAPES) ×3 IMPLANT
ELECT REM PT RETURN 9FT ADLT (ELECTROSURGICAL) ×3
ELECTRODE REM PT RTRN 9FT ADLT (ELECTROSURGICAL) ×1 IMPLANT
GAUZE SPONGE 4X4 12PLY STRL (GAUZE/BANDAGES/DRESSINGS) ×3 IMPLANT
GLOVE BIOGEL PI IND STRL 6.5 (GLOVE) IMPLANT
GLOVE BIOGEL PI IND STRL 7.0 (GLOVE) IMPLANT
GLOVE BIOGEL PI IND STRL 7.5 (GLOVE) IMPLANT
GLOVE BIOGEL PI IND STRL 8 (GLOVE) ×1 IMPLANT
GLOVE BIOGEL PI INDICATOR 6.5 (GLOVE) ×4
GLOVE BIOGEL PI INDICATOR 7.0 (GLOVE) ×4
GLOVE BIOGEL PI INDICATOR 7.5 (GLOVE) ×4
GLOVE BIOGEL PI INDICATOR 8 (GLOVE) ×6
GLOVE ECLIPSE 7.5 STRL STRAW (GLOVE) ×7 IMPLANT
GOWN STRL REUS W/ TWL LRG LVL3 (GOWN DISPOSABLE) IMPLANT
GOWN STRL REUS W/ TWL XL LVL3 (GOWN DISPOSABLE) ×1 IMPLANT
GOWN STRL REUS W/TWL 2XL LVL3 (GOWN DISPOSABLE) ×4 IMPLANT
GOWN STRL REUS W/TWL LRG LVL3 (GOWN DISPOSABLE) ×3
GOWN STRL REUS W/TWL XL LVL3 (GOWN DISPOSABLE) ×6
KIT BASIN OR (CUSTOM PROCEDURE TRAY) ×3 IMPLANT
KIT TURNOVER KIT B (KITS) ×3 IMPLANT
NDL SPNL 18GX3.5 QUINCKE PK (NEEDLE) ×1 IMPLANT
NDL SPNL 22GX3.5 QUINCKE BK (NEEDLE) ×1 IMPLANT
NEEDLE HYPO 18GX1.5 BLUNT FILL (NEEDLE) ×3 IMPLANT
NEEDLE SPNL 18GX3.5 QUINCKE PK (NEEDLE) ×3 IMPLANT
NEEDLE SPNL 22GX3.5 QUINCKE BK (NEEDLE) ×3 IMPLANT
NS IRRIG 1000ML POUR BTL (IV SOLUTION) ×3 IMPLANT
OIL CARTRIDGE MAESTRO DRILL (MISCELLANEOUS) ×3
PACK LAMINECTOMY NEURO (CUSTOM PROCEDURE TRAY) ×3 IMPLANT
PAD ARMBOARD 7.5X6 YLW CONV (MISCELLANEOUS) ×9 IMPLANT
PATTIES SURGICAL .5 X1 (DISPOSABLE) IMPLANT
RUBBERBAND STERILE (MISCELLANEOUS) ×6 IMPLANT
SPONGE LAP 4X18 RFD (DISPOSABLE) IMPLANT
SPONGE SURGIFOAM ABS GEL SZ50 (HEMOSTASIS) ×3 IMPLANT
STRIP CLOSURE SKIN 1/2X4 (GAUZE/BANDAGES/DRESSINGS) IMPLANT
SUT PROLENE 6 0 BV (SUTURE) IMPLANT
SUT VIC AB 1 CT1 18XBRD ANBCTR (SUTURE) ×1 IMPLANT
SUT VIC AB 1 CT1 8-18 (SUTURE) ×3
SUT VIC AB 2-0 CP2 18 (SUTURE) ×3 IMPLANT
SUT VIC AB 3-0 SH 8-18 (SUTURE) IMPLANT
SYR 5ML LL (SYRINGE) IMPLANT
TAPE CLOTH SURG 4X10 WHT LF (GAUZE/BANDAGES/DRESSINGS) ×2 IMPLANT
TOWEL GREEN STERILE (TOWEL DISPOSABLE) ×3 IMPLANT
TOWEL GREEN STERILE FF (TOWEL DISPOSABLE) ×3 IMPLANT
WATER STERILE IRR 1000ML POUR (IV SOLUTION) ×3 IMPLANT

## 2018-01-24 NOTE — H&P (Signed)
Subjective: Patient is a 77 y.o. right-handed white female who is admitted for treatment of left L4-5 lumbar disc condition with a fragment that has extended caudally behind the body of L5, compressing the exiting left L5 nerve root.  Patient has been having low back pain for the past 5 months but 6 weeks ago she developed acute severe pain and burning in the left side of her low back extending into the left buttock, posterior thigh, and calf.  She was treated with a steroid Dosepak without improvement.  She has been requiring pain medication for relief of the disabling pain.  She was evaluated with x-rays and MRI scan which revealed the left L4-5 lumbar disc herniation corresponding to her left lumbar radiculopathy.  She is admitted now for a left L4-5 lumbar laminotomy and microdiscectomy..    Patient Active Problem List   Diagnosis Date Noted  . Constipation 03/09/2017  . Pseudogout of knee, right 02/22/2017  . Renal insufficiency 02/22/2017  . Septic joint of right knee joint ([place]) 02/21/2017  . Infection of right knee ([place]) 02/06/2017  . Septic arthritis ([place]) 02/06/2017  . Chest pain 03/04/2015  . Essential hypertension   . Dyslipidemia   . Primary osteoarthritis of right hip 12/16/2014  . Postoperative anemia due to acute blood loss 12/16/2014  . Bilateral carotid artery disease ([place]) 11/05/2014  . Multiple fractures of ribs of right side 06/18/2012  . Pneumothorax on right 06/18/2012  . Unstable angina ([place]) 01/31/2012  . Cerebrovascular disease 01/31/2012  . Arteriosclerotic cardiovascular disease (ASCVD)   . Hyperlipidemia   . Hypertension   . Anemia, normocytic normochromic 01/15/2012  . MITRAL VALVE PROLAPSE 12/24/2008   Past Medical History:  Diagnosis Date  . Anemia, normocytic normochromic 01/2012   hx of  . Anxiety    takes Xanax daily as needed  . Arthritis    knees, hands  . Cataract    right eye  . Depression    takes Citalopram daily  . Dizziness     cardiologist is aware and told pt it was related to meds  . History of bronchitis    2014  . History of colon polyps    benign  . History of migraine   . Hyperlipidemia    takes Pravastatin daily  . Hypertension    takes Metoprolol and [place] daily  . Joint pain   . Pneumonia    hx of-2015  . Pseudogout of knee, right 02/22/2017  . Renal insufficiency 02/22/2017  . Rheumatic fever    hx of  . Wheezing on expiration     Past Surgical History:  Procedure Laterality Date  . ABDOMINAL HYSTERECTOMY    . APPENDECTOMY    . CARDIAC CATHETERIZATION     early 2000's  . CARDIAC CATHETERIZATION N/A 03/04/2015   Procedure: Left Heart Cath and Coronary Angiography;  Surgeon: Sherren Mocha, MD;  Location: [facility];  Service: Cardiovascular;  Laterality: N/A;  . cataract surgery Left   . CHOLECYSTECTOMY    . CHONDROPLASTY Right 02/21/2017   Procedure: CHONDROPLASTY WITH DEBRIDEMENT;  Surgeon: Elsie Saas, MD;  Location: [facility];  Service: Orthopedics;  Laterality: Right;  . COLONOSCOPY  2005   Negative screening study  . COLONOSCOPY N/A 04/04/2014   Procedure: COLONOSCOPY;  Surgeon: Rogene Houston, MD;  [facility];  Service: Endoscopy;  Laterality: N/A;  1225  . EXPLORATORY LAPAROTOMY     x 3  . IR GENERIC HISTORICAL  10/15/2015  IR RADIOLOGIST EVAL & MGMT 10/15/2015 [facility]  . IR GENERIC HISTORICAL  10/20/2015   IR KYPHO LUMBAR INC FX REDUCE BONE BX UNI/BIL CANNULATION INC/IMAGING 10/20/2015 Luanne Bras, MD [facility]  . IRRIGATION AND DEBRIDEMENT KNEE Right 02/21/2017   Procedure: IRRIGATION AND DEBRIDEMENT KNEE;  Surgeon: Elsie Saas, MD;  Location: [facility];  Service: Orthopedics;  Laterality: Right;  . TOTAL HIP ARTHROPLASTY Right 12/16/2014   Procedure: TOTAL HIP ARTHROPLASTY ANTERIOR APPROACH;  Surgeon: Renette Butters, MD;  Location: [facility];  Service: Orthopedics;  Laterality: Right;    Medications Prior to Admission   Medication Sig Dispense Refill Last Dose  . alendronate (FOSAMAX) 70 MG tablet Take 70 mg by mouth once a week. Saturdays   Past Month at Unknown time  . allopurinol (ZYLOPRIM) 100 MG tablet Take 200 mg by mouth daily.   01/24/2018 at Unknown time  . ALPRAZolam (XANAX) 0.5 MG tablet Take 0.5 mg by mouth 2 (two) times daily as needed. Anxiety/sleep aid   01/24/2018 at Unknown time  . amLODipine-valsartan (EXFORGE) 5-320 MG tablet Take 1 tablet by mouth daily.   01/23/2018 at Unknown time  . aspirin 81 MG tablet Take 81 mg by mouth daily.   Past Month at Unknown time  . chlorthalidone (HYGROTON) 25 MG tablet Take 25 mg by mouth daily.   01/23/2018 at Unknown time  . citalopram (CELEXA) 10 MG tablet Take 10 mg by mouth daily.   01/23/2018 at Unknown time  . Cyanocobalamin (VITAMIN B-12 IJ) Inject 1 mL as directed every 30 (thirty) days.   Past Month at Unknown time  . metoprolol succinate (TOPROL-XL) 25 MG 24 hr tablet Take 25 mg by mouth daily.     01/24/2018 at Unknown time  . pravastatin (PRAVACHOL) 40 MG tablet Take 40 mg by mouth at bedtime.    01/23/2018 at Unknown time  . albuterol (PROAIR HFA) 108 (90 Base) MCG/ACT inhaler Inhale 1 puff into the lungs every 4 (four) hours as needed for wheezing.   More than a month at Unknown time   Allergies  Allergen Reactions  . Palladium Chloride Other (See Comments)    Positive patch test   . Statins Other (See Comments)    Atorvastatin, pravastatin, simvastatin->myalgias   . Bacitracin Rash  . Gold Sodium Thiosulfate Rash  . Nickel Itching and Rash    Possibility...still trying to figure out     Social History   Tobacco Use  . Smoking status: Former Smoker    Packs/day: 1.00    Years: 15.00    Pack years: 15.00  . Smokeless tobacco: Never Used  . Tobacco comment: quit smoking 30+yrs ago  Substance Use Topics  . Alcohol use: Yes    Alcohol/week: 3.0 standard drinks    Types: 3 Glasses of wine per week    Comment: socially wine     Family History  Problem Relation Age of Onset  . Heart attack Mother 43       heart problems  . Cancer Mother        uterus  . Pancreatitis Sister        died  age 56  . Colon cancer Maternal Aunt 81  . Colon cancer Maternal Aunt 78  . Colon cancer Maternal Aunt 76  . Cancer Unknown        FH  . Diabetes Unknown        FH  . Hypertension Unknown  FH  . Diabetes Son   . Diabetes Daughter   . Colon cancer Maternal Uncle 40     Review of Systems Pertinent items noted in HPI and remainder of comprehensive ROS otherwise negative.  Objective: Vital signs in last 24 hours: Temp:  [97.9 F (36.6 C)] 97.9 F (36.6 C) (12/11 1150) Pulse Rate:  [60] 60 (12/11 1150) Resp:  [18] 18 (12/11 1150) BP: (121)/(46) 121/46 (12/11 1150) SpO2:  [100 %] 100 % (12/11 1150) Weight:  [58.3 kg] 58.3 kg (12/11 1150)  EXAM: Patient is well-developed well-nourished white female in discomfort but no acute distress. Lungs are clear to auscultation , the patient has symmetrical respiratory excursion. Heart has a regular rate and rhythm normal S1 and S2 no murmur.   Abdomen is soft nontender nondistended bowel sounds are present. Extremity examination shows no clubbing cyanosis or edema. Motor examination shows 5 over 5 strength in the lower extremities including the iliopsoas quadriceps dorsiflexor extensor hallicus  longus and plantar flexor bilaterally. Sensation is intact to pinprick in the distal lower extremities. Reflexes are symmetrical bilaterally. No pathologic reflexes are present. Patient has a normal gait and stance.   Data Review:CBC    Component Value Date/Time   WBC 8.0 01/19/2018 1113   RBC 3.78 (L) 01/19/2018 1113   HGB 11.6 (L) 01/19/2018 1113   HCT 37.5 01/19/2018 1113   PLT 428 (H) 01/19/2018 1113   MCV 99.2 01/19/2018 1113   MCH 30.7 01/19/2018 1113   MCHC 30.9 01/19/2018 1113   RDW 12.4 01/19/2018 1113   LYMPHSABS 1.2 02/22/2017 0500   MONOABS 0.6 02/22/2017 0500    EOSABS 0.0 02/22/2017 0500   BASOSABS 0.0 02/22/2017 0500                          BMET    Component Value Date/Time   NA 139 01/19/2018 1113   K 3.8 01/19/2018 1113   CL 103 01/19/2018 1113   CO2 24 01/19/2018 1113   GLUCOSE 114 (H) 01/19/2018 1113   BUN 25 (H) 01/19/2018 1113   CREATININE 1.06 (H) 01/19/2018 1113   CALCIUM 9.6 01/19/2018 1113   GFRNONAA 50 (L) 01/19/2018 1113   GFRAA 58 (L) 01/19/2018 1113     Assessment/Plan: Patient admitted for a left L4-5 lumbar laminotomy microdiscectomy for left L4-5 lumbar disc herniation.  I've discussed with the patient the nature of his condition, the nature the surgical procedure, the typical length of surgery, hospital stay, and overall recuperation. We discussed limitations postoperatively. I discussed risks of surgery including risks of infection, bleeding, possibly need for transfusion, the risk of nerve root dysfunction with pain, weakness, numbness, or paresthesias, or risk of dural tear and CSF leakage and possible need for further surgery, the risk of recurrent disc herniation and the possible need for further surgery, and the risk of anesthetic complications including myocardial infarction, stroke, pneumonia, and death. Understanding all this the patient does wish to proceed with surgery and is admitted for such.   Hosie Spangle, MD 01/24/2018 12:34 PM

## 2018-01-24 NOTE — Transfer of Care (Signed)
Immediate Anesthesia Transfer of Care Note  Patient: Mackenzie [name]  Procedure(s) Performed: LEFT LUMBAR FOUR- LUMBAR FIVE  LAMINOTOMY AND MICRODISCECTOMY (Left Spine Lumbar)  Patient Location: PACU  Anesthesia Type:General  Level of Consciousness: awake  Airway & Oxygen Therapy: Patient Spontanous Breathing and Patient connected to face mask oxygen  Post-op Assessment: Report given to RN and Post -op Vital signs reviewed and stable  Post vital signs: Reviewed and stable  Last Vitals:  Vitals Value Taken Time  BP 131/98 01/24/2018  2:51 PM  Temp    Pulse 76 01/24/2018  2:53 PM  Resp 13 01/24/2018  2:53 PM  SpO2 100 % 01/24/2018  2:53 PM  Vitals shown include unvalidated device data.  Last Pain:  Vitals:   01/24/18 1214  TempSrc:   PainSc: 0-No pain         Complications: No apparent anesthesia complications

## 2018-01-24 NOTE — Anesthesia Procedure Notes (Signed)
Procedure Name: Intubation Date/Time: 01/24/2018 1:13 PM Performed by: Oletta Lamas, CRNA Pre-anesthesia Checklist: Patient identified, Emergency Drugs available, Suction available and Patient being monitored Patient Re-evaluated:Patient Re-evaluated prior to induction Oxygen Delivery Method: Circle System Utilized Preoxygenation: Pre-oxygenation with 100% oxygen Induction Type: IV induction Ventilation: Mask ventilation without difficulty Laryngoscope Size: Miller and 2 Grade View: Grade I Tube type: Oral Number of attempts: 1 Airway Equipment and Method: Stylet Placement Confirmation: ETT inserted through vocal cords under direct vision,  positive ETCO2 and breath sounds checked- equal and bilateral Secured at: 23 cm Tube secured with: Tape Dental Injury: Teeth and Oropharynx as per pre-operative assessment

## 2018-01-24 NOTE — Anesthesia Preprocedure Evaluation (Addendum)
Anesthesia Evaluation  Patient identified by MRN, date of birth, ID band Patient awake    Reviewed: Allergy & Precautions, NPO status , Patient's Chart, lab work & pertinent test results, reviewed documented beta blocker date and time   Airway Mallampati: II  TM Distance: <3 FB Neck ROM: Full    Dental  (+) Dental Advisory Given, Partial Lower, Caps   Pulmonary former smoker,    Pulmonary exam normal breath sounds clear to auscultation       Cardiovascular hypertension, Pt. on home beta blockers + angina with exertion + CAD and + Peripheral Vascular Disease  Normal cardiovascular exam Rhythm:Regular Rate:Normal  LHC 02/2015: 1. Minor nonobstructive CAD as detailed below with calcified coronary arteries but no obstruction 2. Normal LV function   Neuro/Psych PSYCHIATRIC DISORDERS Anxiety Depression negative neurological ROS     GI/Hepatic negative GI ROS, Neg liver ROS,   Endo/Other  negative endocrine ROS  Renal/GU Renal InsufficiencyRenal diseasenegative Renal ROS     Musculoskeletal  (+) Arthritis , Osteoarthritis,    Abdominal   Peds  Hematology  (+) Blood dyscrasia, anemia ,   Anesthesia Other Findings Day of surgery medications reviewed with the patient.  Reproductive/Obstetrics                            Anesthesia Physical  Anesthesia Plan  ASA: III  Anesthesia Plan: General   Post-op Pain Management:    Induction: Intravenous  PONV Risk Score and Plan: 3 and Dexamethasone, Ondansetron and Treatment may vary due to age or medical condition  Airway Management Planned: Oral ETT  Additional Equipment:   Intra-op Plan:   Post-operative Plan: Extubation in OR  Informed Consent: I have reviewed the patients History and Physical, chart, labs and discussed the procedure including the risks, benefits and alternatives for the proposed anesthesia with the patient or authorized  representative who has indicated his/her understanding and acceptance.   Dental advisory given  Plan Discussed with: CRNA  Anesthesia Plan Comments:         Anesthesia Quick Evaluation

## 2018-01-24 NOTE — Op Note (Signed)
01/24/2018  2:39 PM  PATIENT:  Mackenzie [name]  77 y.o. female  PRE-OPERATIVE DIAGNOSIS: Left L4-5 lumbar disc herniation, lumbar degenerative disease, lumbar spondylosis, left lumbar radiculopathy  POST-OPERATIVE DIAGNOSIS: Left L4-5 lumbar disc herniation, lumbar degenerative disease, lumbar spondylosis, left lumbar radiculopathy hnp  PROCEDURE:  Procedure(s): Left L4-5 lumbar laminotomy and microdiscectomy, with microdissection, microsurgical technique, and the operating microscope  SURGEON: Jovita Gamma, MD  ASSISTANTS: Newman Pies, MD  ANESTHESIA:   general  EBL:  Total I/O In: 700 [I.V.:700] Out: - 25 cc  BLOOD ADMINISTERED:none  COUNT:  Correct per nursing staff  DICTATION: Patient was brought to the operating room and placed under general endotracheal anesthesia. Patient was turned to prone position the lumbar region was prepped with Betadine soap and solution and draped in a sterile fashion. The midline was infiltrated with local anesthetic with epinephrine. A localizing x-ray was taken and the L4-5 level was identified. Midline incision was made over the L4-5 level and was carried down through the subcutaneous tissue to the lumbar fascia. The lumbar fascia was incised on the left side and the paraspinal muscles were dissected from the spinous processes and lamina in a subperiosteal fashion. Another x-ray was taken and the left L4-5 intralaminar space was identified. The operating microscope was draped and brought into the field provided additional magnification, illumination, and visualization. Laminotomy was performed using the high-speed drill and Kerrison punches. The ligamentum flavum was carefully resected. The underlying thecal sac and nerve root were identified. The disc herniation was identified and the thecal sac and nerve root gently retracted medially.  The disc herniation was located caudal to the disc space, behind the left side of the body of L5, ventral to the  exiting left L5 nerve root.  It was removed in a piecemeal fashion, and all loose fragments of disc material were removed, with good decompression of the thecal sac and exiting left L5 nerve root.  Once the discectomy was completed and good decompression of the thecal sac and nerve had been achieved hemostasis was established with the use of bipolar cautery and Gelfoam with thrombin. The Gelfoam was removed and hemostasis confirmed. We then instilled 2 cc of fentanyl and 80 mg of Depo-Medrol into the epidural space. Deep fascia was closed with interrupted undyed 1 Vicryl sutures. Scarpa's fascia was closed with interrupted undyed 1 Vicryl sutures in the subcutaneous and subcuticular layer were closed with interrupted inverted 2-0 undyed Vicryl sutures. The skin edges were approximated with Dermabond. Following surgery the patient was turned back to a supine position to be reversed from the anesthetic extubated and transferred to the recovery room for further care.  PLAN OF CARE: Admit for overnight observation  PATIENT DISPOSITION:  PACU - hemodynamically stable.   Delay start of Pharmacological VTE agent (>24hrs) due to surgical blood loss or risk of bleeding:  yes

## 2018-01-24 NOTE — Progress Notes (Signed)
Vitals:   01/24/18 1506 01/24/18 1521 01/24/18 1540 01/24/18 1936  BP: (!) 137/48 (!) 133/52 (!) 144/42 (!) 130/57  Pulse: 76 72 67 72  Resp: 13 12 16 18   Temp:  (!) 97.5 F (36.4 C)  (!) 97.4 F (36.3 C)  TempSrc:    Oral  SpO2: 98% 99% 96% 97%  Weight:      Height:        Patient resting comfortably, has been up and ambulating in the halls.  Dressing clean and dry.  Voiding well.  Moving all 4 extremities well.  Plan: Encouraged to continue ambulation in the halls.  Continue to progress through postoperative recovery.  Hosie Spangle, MD 01/24/2018, 8:39 PM

## 2018-01-25 ENCOUNTER — Other Ambulatory Visit: Payer: Self-pay

## 2018-01-25 ENCOUNTER — Encounter (HOSPITAL_COMMUNITY): Payer: Self-pay | Admitting: Neurosurgery

## 2018-01-25 DIAGNOSIS — M4726 Other spondylosis with radiculopathy, lumbar region: Secondary | ICD-10-CM | POA: Diagnosis not present

## 2018-01-25 DIAGNOSIS — E785 Hyperlipidemia, unspecified: Secondary | ICD-10-CM | POA: Diagnosis not present

## 2018-01-25 DIAGNOSIS — F329 Major depressive disorder, single episode, unspecified: Secondary | ICD-10-CM | POA: Diagnosis not present

## 2018-01-25 DIAGNOSIS — F419 Anxiety disorder, unspecified: Secondary | ICD-10-CM | POA: Diagnosis not present

## 2018-01-25 DIAGNOSIS — M5116 Intervertebral disc disorders with radiculopathy, lumbar region: Secondary | ICD-10-CM | POA: Diagnosis not present

## 2018-01-25 DIAGNOSIS — I10 Essential (primary) hypertension: Secondary | ICD-10-CM | POA: Diagnosis not present

## 2018-01-25 MED ORDER — HYDROCODONE-ACETAMINOPHEN 5-325 MG PO TABS: 1.0000 | ORAL_TABLET | ORAL | 0 refills | Status: DC | PRN

## 2018-01-25 NOTE — Anesthesia Postprocedure Evaluation (Signed)
Anesthesia Post Note  Patient: Mackenzie [name]  Procedure(s) Performed: LEFT LUMBAR FOUR- LUMBAR FIVE  LAMINOTOMY AND MICRODISCECTOMY (Left Spine Lumbar)     Patient location during evaluation: PACU Anesthesia Type: General Level of consciousness: awake and alert, awake and oriented Pain management: pain level controlled Vital Signs Assessment: post-procedure vital signs reviewed and stable Respiratory status: spontaneous breathing, nonlabored ventilation and respiratory function stable Cardiovascular status: blood pressure returned to baseline and stable Postop Assessment: no apparent nausea or vomiting Anesthetic complications: no    Last Vitals:  Vitals:   01/25/18 0346 01/25/18 0718  BP: (!) 111/50 (!) 113/46  Pulse: 65 70  Resp: 18 17  Temp: 36.7 C (!) 36.4 C  SpO2: 97% 97%    Last Pain:  Vitals:   01/25/18 0718  TempSrc: Oral  PainSc:                  Catalina Gravel

## 2018-01-25 NOTE — Progress Notes (Signed)
Patient [place] home via car with friend.  DC instructions and prescriptions given to patient and both fully understood.  Vital signs and assessments were stable.

## 2018-01-25 NOTE — Discharge Summary (Signed)
Physician Discharge Summary  Patient ID: Mackenzie [name] MRN: [id number] DOB/AGE: 03/28/1940 77 y.o.  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  Postoperatively she is done well.  She is had excellent relief of her left lumbar radicular pain.  Her dressing was removed, and the wound left open to air.  It is clean and dry.  She is up and ambulating actively.  She is voiding well.  She is being discharged home with instructions regarding wound care and activities.  She is scheduled follow-up with me in 3 weeks.  Discharge Exam: Blood pressure (!) 113/46, pulse 70, temperature (!) 97.5 F (36.4 C), temperature source Oral, resp. rate 17, height 5' (1.524 m), weight 58.3 kg, SpO2 97 %.  Disposition: Discharge disposition: 01-Home or Self Care       Discharge Instructions    Discharge wound care:   Complete by:  As directed    Leave the wound open to air. Shower daily with the wound uncovered. Water and soapy water should run over the incision area. Do not wash directly on the incision for 2 weeks. Remove the glue after 2 weeks.   Driving Restrictions   Complete by:  As directed    No driving for 2 weeks. May ride in the car locally now. May begin to drive locally in 2 weeks.   Other Restrictions   Complete by:  As directed    Walk gradually increasing distances out in the fresh air at least twice a day. Walking additional 6 times inside the house, gradually increasing distances, daily. No bending, lifting, or twisting. Perform activities between  shoulder and waist height (that is at counter height when standing or table height when sitting).     Allergies as of 01/25/2018      Reactions   Palladium Chloride Other (See Comments)   Positive patch test    Statins Other (See Comments)   Atorvastatin, pravastatin, simvastatin->myalgias   Bacitracin Rash   Gold Sodium Thiosulfate Rash   Nickel Itching, Rash   Possibility...still trying to figure out      Medication List    TAKE these medications   alendronate 70 MG tablet Commonly known as:  FOSAMAX Take 70 mg by mouth once a week. Saturdays   allopurinol 100 MG tablet Commonly known as:  ZYLOPRIM Take 200 mg by mouth daily.   ALPRAZolam 0.5 MG tablet Commonly known as:  XANAX Take 0.5 mg by mouth 2 (two) times daily as needed. Anxiety/sleep aid   amLODipine-valsartan 5-320 MG tablet Commonly known as:  EXFORGE Take 1 tablet by mouth daily.   aspirin 81 MG tablet Take 81 mg by mouth daily.   chlorthalidone 25 MG tablet Commonly known as:  HYGROTON Take 25 mg by mouth daily.   citalopram 10 MG tablet Commonly known as:  CELEXA Take 10 mg by mouth daily.   HYDROcodone-acetaminophen 5-325 MG tablet Commonly known as:  NORCO/VICODIN Take 1-2 tablets by mouth every 4 (four) hours as needed (pain).   metoprolol succinate 25 MG 24 hr tablet Commonly known as:  TOPROL-XL Take 25 mg by mouth daily.   pravastatin 40 MG tablet Commonly known  as:  PRAVACHOL Take 40 mg by mouth at bedtime.   PROAIR HFA 108 (90 Base) MCG/ACT inhaler Generic drug:  albuterol Inhale 1 puff into the lungs every 4 (four) hours as needed for wheezing.   VITAMIN B-12 IJ Inject 1 mL as directed every 30 (thirty) days.            Discharge Care Instructions  (From admission, onward)         Start     Ordered   01/25/18 0000  Discharge wound care:    Comments:  Leave the wound open to air. Shower daily with the wound uncovered. Water and soapy water should run over the incision  area. Do not wash directly on the incision for 2 weeks. Remove the glue after 2 weeks.   01/25/18 0743           Signed: Hosie Spangle 01/25/2018, 7:43 AM

## 2018-04-19 DIAGNOSIS — E663 Overweight: Secondary | ICD-10-CM | POA: Diagnosis not present

## 2018-04-19 DIAGNOSIS — L509 Urticaria, unspecified: Secondary | ICD-10-CM | POA: Diagnosis not present

## 2018-04-19 DIAGNOSIS — M7989 Other specified soft tissue disorders: Secondary | ICD-10-CM | POA: Diagnosis not present

## 2018-04-19 DIAGNOSIS — M25461 Effusion, right knee: Secondary | ICD-10-CM | POA: Diagnosis not present

## 2018-04-19 DIAGNOSIS — Z6826 Body mass index (BMI) 26.0-26.9, adult: Secondary | ICD-10-CM | POA: Diagnosis not present

## 2018-04-19 DIAGNOSIS — R21 Rash and other nonspecific skin eruption: Secondary | ICD-10-CM | POA: Diagnosis not present

## 2018-04-19 DIAGNOSIS — M109 Gout, unspecified: Secondary | ICD-10-CM | POA: Diagnosis not present

## 2018-04-23 DIAGNOSIS — L309 Dermatitis, unspecified: Secondary | ICD-10-CM | POA: Diagnosis not present

## 2018-04-23 DIAGNOSIS — L509 Urticaria, unspecified: Secondary | ICD-10-CM | POA: Diagnosis not present

## 2018-07-27 DIAGNOSIS — M5136 Other intervertebral disc degeneration, lumbar region: Secondary | ICD-10-CM | POA: Diagnosis not present

## 2018-07-27 DIAGNOSIS — M47816 Spondylosis without myelopathy or radiculopathy, lumbar region: Secondary | ICD-10-CM | POA: Diagnosis not present

## 2018-07-27 DIAGNOSIS — M545 Low back pain: Secondary | ICD-10-CM | POA: Diagnosis not present

## 2018-07-27 DIAGNOSIS — I10 Essential (primary) hypertension: Secondary | ICD-10-CM | POA: Diagnosis not present

## 2018-07-27 DIAGNOSIS — Z9889 Other specified postprocedural states: Secondary | ICD-10-CM | POA: Diagnosis not present

## 2018-07-27 DIAGNOSIS — Z6825 Body mass index (BMI) 25.0-25.9, adult: Secondary | ICD-10-CM | POA: Diagnosis not present

## 2018-08-07 ENCOUNTER — Other Ambulatory Visit: Payer: Self-pay | Admitting: Internal Medicine

## 2018-08-07 ENCOUNTER — Other Ambulatory Visit: Payer: Medicare Other

## 2018-08-07 DIAGNOSIS — Z20822 Contact with and (suspected) exposure to covid-19: Secondary | ICD-10-CM

## 2018-08-07 DIAGNOSIS — R6889 Other general symptoms and signs: Secondary | ICD-10-CM | POA: Diagnosis not present

## 2018-08-09 LAB — NOVEL CORONAVIRUS, NAA: SARS-CoV-2, NAA: NOT DETECTED

## 2018-08-15 ENCOUNTER — Other Ambulatory Visit: Payer: Self-pay | Admitting: Neurosurgery

## 2018-08-15 ENCOUNTER — Other Ambulatory Visit (HOSPITAL_COMMUNITY): Payer: Self-pay | Admitting: Neurosurgery

## 2018-08-15 DIAGNOSIS — G8929 Other chronic pain: Secondary | ICD-10-CM

## 2018-08-21 ENCOUNTER — Other Ambulatory Visit: Payer: Self-pay

## 2018-08-21 ENCOUNTER — Ambulatory Visit (HOSPITAL_COMMUNITY)
Admission: RE | Admit: 2018-08-21 | Discharge: 2018-08-21 | Disposition: A | Discharge status: Home / Self Care | Payer: Medicare Other | Source: Ambulatory Visit | Attending: Neurosurgery | Admitting: Neurosurgery

## 2018-08-21 DIAGNOSIS — G8929 Other chronic pain: Secondary | ICD-10-CM

## 2018-08-21 DIAGNOSIS — M545 Low back pain, unspecified: Secondary | ICD-10-CM

## 2018-08-21 DIAGNOSIS — M5135 Other intervertebral disc degeneration, thoracolumbar region: Secondary | ICD-10-CM | POA: Diagnosis not present

## 2018-08-21 LAB — POCT I-STAT CREATININE: Creatinine, Ser: 0.8 mg/dL (ref 0.44–1.00)

## 2018-08-21 MED ORDER — GADOBUTROL 1 MMOL/ML IV SOLN
6.0000 mL | Freq: Once | INTRAVENOUS | Status: AC | PRN
  Administered 2018-08-21: 6 mL via INTRAVENOUS

## 2018-08-31 DIAGNOSIS — H35372 Puckering of macula, left eye: Secondary | ICD-10-CM | POA: Diagnosis not present

## 2018-08-31 DIAGNOSIS — H524 Presbyopia: Secondary | ICD-10-CM | POA: Diagnosis not present

## 2018-08-31 DIAGNOSIS — H353121 Nonexudative age-related macular degeneration, left eye, early dry stage: Secondary | ICD-10-CM | POA: Diagnosis not present

## 2018-08-31 DIAGNOSIS — Z961 Presence of intraocular lens: Secondary | ICD-10-CM | POA: Diagnosis not present

## 2018-09-05 DIAGNOSIS — M5136 Other intervertebral disc degeneration, lumbar region: Secondary | ICD-10-CM | POA: Diagnosis not present

## 2018-09-05 DIAGNOSIS — Z9889 Other specified postprocedural states: Secondary | ICD-10-CM | POA: Diagnosis not present

## 2018-09-05 DIAGNOSIS — I10 Essential (primary) hypertension: Secondary | ICD-10-CM | POA: Diagnosis not present

## 2018-09-05 DIAGNOSIS — M47816 Spondylosis without myelopathy or radiculopathy, lumbar region: Secondary | ICD-10-CM | POA: Diagnosis not present

## 2018-09-05 DIAGNOSIS — M545 Low back pain: Secondary | ICD-10-CM | POA: Diagnosis not present

## 2018-09-05 DIAGNOSIS — Z6825 Body mass index (BMI) 25.0-25.9, adult: Secondary | ICD-10-CM | POA: Diagnosis not present

## 2018-09-20 DIAGNOSIS — F329 Major depressive disorder, single episode, unspecified: Secondary | ICD-10-CM | POA: Diagnosis not present

## 2018-09-20 DIAGNOSIS — M1990 Unspecified osteoarthritis, unspecified site: Secondary | ICD-10-CM | POA: Diagnosis not present

## 2018-09-20 DIAGNOSIS — Z79899 Other long term (current) drug therapy: Secondary | ICD-10-CM | POA: Diagnosis not present

## 2018-09-20 DIAGNOSIS — E785 Hyperlipidemia, unspecified: Secondary | ICD-10-CM | POA: Diagnosis not present

## 2018-09-20 DIAGNOSIS — I10 Essential (primary) hypertension: Secondary | ICD-10-CM | POA: Diagnosis not present

## 2018-09-28 DIAGNOSIS — I10 Essential (primary) hypertension: Secondary | ICD-10-CM | POA: Diagnosis not present

## 2018-09-28 DIAGNOSIS — F418 Other specified anxiety disorders: Secondary | ICD-10-CM | POA: Diagnosis not present

## 2018-09-28 DIAGNOSIS — M810 Age-related osteoporosis without current pathological fracture: Secondary | ICD-10-CM | POA: Diagnosis not present

## 2018-09-28 DIAGNOSIS — E785 Hyperlipidemia, unspecified: Secondary | ICD-10-CM | POA: Diagnosis not present

## 2018-10-23 DIAGNOSIS — M109 Gout, unspecified: Secondary | ICD-10-CM | POA: Diagnosis not present

## 2018-10-23 DIAGNOSIS — L509 Urticaria, unspecified: Secondary | ICD-10-CM | POA: Diagnosis not present

## 2018-10-23 DIAGNOSIS — M25461 Effusion, right knee: Secondary | ICD-10-CM | POA: Diagnosis not present

## 2018-10-23 DIAGNOSIS — Z6825 Body mass index (BMI) 25.0-25.9, adult: Secondary | ICD-10-CM | POA: Diagnosis not present

## 2018-10-23 DIAGNOSIS — M7989 Other specified soft tissue disorders: Secondary | ICD-10-CM | POA: Diagnosis not present

## 2018-10-23 DIAGNOSIS — R21 Rash and other nonspecific skin eruption: Secondary | ICD-10-CM | POA: Diagnosis not present

## 2018-10-23 DIAGNOSIS — R309 Painful micturition, unspecified: Secondary | ICD-10-CM | POA: Diagnosis not present

## 2018-10-23 DIAGNOSIS — E663 Overweight: Secondary | ICD-10-CM | POA: Diagnosis not present

## 2018-12-06 DIAGNOSIS — Z23 Encounter for immunization: Secondary | ICD-10-CM | POA: Diagnosis not present

## 2019-01-25 ENCOUNTER — Other Ambulatory Visit: Payer: Self-pay

## 2019-01-25 DIAGNOSIS — I10 Essential (primary) hypertension: Secondary | ICD-10-CM | POA: Diagnosis not present

## 2019-01-25 DIAGNOSIS — R05 Cough: Secondary | ICD-10-CM | POA: Diagnosis not present

## 2019-01-25 DIAGNOSIS — Z20828 Contact with and (suspected) exposure to other viral communicable diseases: Secondary | ICD-10-CM | POA: Diagnosis not present

## 2019-01-25 DIAGNOSIS — Z20822 Contact with and (suspected) exposure to covid-19: Secondary | ICD-10-CM

## 2019-01-26 LAB — NOVEL CORONAVIRUS, NAA: SARS-CoV-2, NAA: NOT DETECTED

## 2019-02-26 DIAGNOSIS — Z23 Encounter for immunization: Secondary | ICD-10-CM | POA: Diagnosis not present

## 2019-03-04 ENCOUNTER — Emergency Department (HOSPITAL_COMMUNITY): Payer: Medicare Other

## 2019-03-04 ENCOUNTER — Other Ambulatory Visit: Payer: Self-pay

## 2019-03-04 ENCOUNTER — Encounter (HOSPITAL_COMMUNITY): Payer: Self-pay | Admitting: Emergency Medicine

## 2019-03-04 ENCOUNTER — Emergency Department (HOSPITAL_COMMUNITY)
Admission: EM | Admit: 2019-03-04 | Discharge: 2019-03-04 | Disposition: A | Discharge status: Home / Self Care | Payer: Medicare Other | Attending: Emergency Medicine | Admitting: Emergency Medicine

## 2019-03-04 DIAGNOSIS — R079 Chest pain, unspecified: Secondary | ICD-10-CM | POA: Insufficient documentation

## 2019-03-04 DIAGNOSIS — R0602 Shortness of breath: Secondary | ICD-10-CM | POA: Insufficient documentation

## 2019-03-04 DIAGNOSIS — Z20822 Contact with and (suspected) exposure to covid-19: Secondary | ICD-10-CM | POA: Insufficient documentation

## 2019-03-04 DIAGNOSIS — N289 Disorder of kidney and ureter, unspecified: Secondary | ICD-10-CM | POA: Diagnosis not present

## 2019-03-04 DIAGNOSIS — R0789 Other chest pain: Secondary | ICD-10-CM | POA: Diagnosis not present

## 2019-03-04 DIAGNOSIS — Z79899 Other long term (current) drug therapy: Secondary | ICD-10-CM | POA: Insufficient documentation

## 2019-03-04 DIAGNOSIS — M79604 Pain in right leg: Secondary | ICD-10-CM | POA: Diagnosis not present

## 2019-03-04 DIAGNOSIS — I10 Essential (primary) hypertension: Secondary | ICD-10-CM | POA: Insufficient documentation

## 2019-03-04 DIAGNOSIS — Z87891 Personal history of nicotine dependence: Secondary | ICD-10-CM | POA: Diagnosis not present

## 2019-03-04 LAB — BASIC METABOLIC PANEL
Anion gap: 9 (ref 5–15)
BUN: 24 mg/dL — ABNORMAL HIGH (ref 8–23)
CO2: 28 mmol/L (ref 22–32)
Calcium: 9.6 mg/dL (ref 8.9–10.3)
Chloride: 103 mmol/L (ref 98–111)
Creatinine, Ser: 0.81 mg/dL (ref 0.44–1.00)
GFR calc Af Amer: >60 mL/min (ref >60)
GFR calc non Af Amer: >60 mL/min (ref >60)
Glucose, Bld: 106 mg/dL — ABNORMAL HIGH (ref 70–99)
Potassium: 4.1 mmol/L (ref 3.5–5.1)
Sodium: 140 mmol/L (ref 135–145)

## 2019-03-04 LAB — TROPONIN I (HIGH SENSITIVITY)
Troponin I (High Sensitivity): <2 ng/L (ref <18)
Troponin I (High Sensitivity): <2 ng/L (ref <18)

## 2019-03-04 LAB — CBC
HCT: 38.8 % (ref 36.0–46.0)
Hemoglobin: 12.4 g/dL (ref 12.0–15.0)
MCH: 30.7 pg (ref 26.0–34.0)
MCHC: 32 g/dL (ref 30.0–36.0)
MCV: 96 fL (ref 80.0–100.0)
Platelets: 400 10*3/uL (ref 150–400)
RBC: 4.04 MIL/uL (ref 3.87–5.11)
RDW: 13.1 % (ref 11.5–15.5)
WBC: 8.2 10*3/uL (ref 4.0–10.5)
nRBC: 0 % (ref 0.0–0.2)

## 2019-03-04 LAB — D-DIMER, QUANTITATIVE: D-Dimer, Quant: 0.41 ug/mL-FEU (ref 0.00–0.50)

## 2019-03-04 NOTE — ED Notes (Signed)
Ambulated around nurse station.   o2 sats were 95-98% on ra.   Did not c/o any sob, cp or dizziness.   States "I feel fine". Gait was good.

## 2019-03-04 NOTE — Discharge Instructions (Signed)
Your labwork and images were very reassuring today. We have swabbed you for COVID - please stay home and self isolate until you receive your results (this can take 2-3 days to return). You will receive a call IF positive. You can log into Mychart to check your results as well.   Please follow up with your cardiologist. You may also follow up with our cardiologist if yours cannot see you soon.   Please also follow up with your PCP regarding your symptoms.   Return to the ED for any worsening symptoms including worsening pain, worsening shortness of breath, vomiting, dizziness/lightheadedness, ripping back pain, weakness/numbness in your extremities.

## 2019-03-04 NOTE — ED Provider Notes (Signed)
[facility] EMERGENCY DEPARTMENT Provider Note   CSN: [id number] Arrival date & time: 03/04/19  1130     History Chief Complaint  Patient presents with  . Chest Pain    Mackenzie [name] is a 79 y.o. female with PMHx HTN, HLD, who presents to the ED today complaining of gradual onset, constant, chest pressure/heaviness that began 1 week ago.  Patient also complains of inability to take a deep breath in.  She states she feels restricted in this way.  Does report that she felt fatigued with chest pressure earlier today which concerned her, she states shortly started having nausea after that prompting her to call EMS.  She states she has also been having an intermittent pain in her right lower extremity for the past week.  She states that she is concerned that she could have a blood clot.  She denies any history of DVT/PE.  No history of recent prolonged travel or immobilization.  No active malignancy.  No exogenous hormone use.  No hemoptysis.  Patient states that she always feels chilly and does not think it has been worse than normal.  She is denying any recent COVID-19 positive exposure.  Reports that the only individual she sees for her children however they do not live with her.  Denies fever.   The history is provided by the patient.       Past Medical History:  Diagnosis Date  . Anemia, normocytic normochromic 01/2012   hx of  . Anxiety    takes Xanax daily as needed  . Arthritis    knees, hands  . Cataract    right eye  . Depression    takes Citalopram daily  . Dizziness    cardiologist is aware and told pt it was related to meds  . History of bronchitis    2014  . History of colon polyps    benign  . History of migraine   . Hyperlipidemia    takes Pravastatin daily  . Hypertension    takes Metoprolol and Azor daily  . Joint pain   . Pneumonia    hx of-2015  . Pseudogout of knee, right 02/22/2017  . Renal insufficiency 02/22/2017  . Rheumatic fever    hx of  . Wheezing  on expiration     Patient Active Problem List   Diagnosis Date Noted  . Lumbar disc herniation 01/24/2018  . Constipation 03/09/2017  . Pseudogout of knee, right 02/22/2017  . Renal insufficiency 02/22/2017  . Septic joint of right knee joint ([place]) 02/21/2017  . Infection of right knee ([place]) 02/06/2017  . Septic arthritis ([place]) 02/06/2017  . Chest pain 03/04/2015  . Essential hypertension   . Dyslipidemia   . Primary osteoarthritis of right hip 12/16/2014  . Postoperative anemia due to acute blood loss 12/16/2014  . Bilateral carotid artery disease ([place]) 11/05/2014  . Multiple fractures of ribs of right side 06/18/2012  . Pneumothorax on right 06/18/2012  . Unstable angina ([place]) 01/31/2012  . Cerebrovascular disease 01/31/2012  . Arteriosclerotic cardiovascular disease (ASCVD)   . Hyperlipidemia   . Hypertension   . Anemia, normocytic normochromic 01/15/2012  . MITRAL VALVE PROLAPSE 12/24/2008    Past Surgical History:  Procedure Laterality Date  . ABDOMINAL HYSTERECTOMY    . APPENDECTOMY    . CARDIAC CATHETERIZATION     early 2000's  . CARDIAC CATHETERIZATION N/A 03/04/2015   Procedure: Left Heart Cath and Coronary Angiography;  Surgeon: Sherren Mocha, MD;  Location: [facility]  INVASIVE CV LAB;  Service: Cardiovascular;  Laterality: N/A;  . cataract surgery Left   . CHOLECYSTECTOMY    . CHONDROPLASTY Right 02/21/2017   Procedure: CHONDROPLASTY WITH DEBRIDEMENT;  Surgeon: Elsie Saas, MD;  Location: [place];  Service: Orthopedics;  Laterality: Right;  . COLONOSCOPY  2005   Negative screening study  . COLONOSCOPY N/A 04/04/2014   Procedure: COLONOSCOPY;  Surgeon: Rogene Houston, MD;  [facility];  Service: Endoscopy;  Laterality: N/A;  1225  . EXPLORATORY LAPAROTOMY     x 3  . IR GENERIC HISTORICAL  10/15/2015   IR RADIOLOGIST EVAL & MGMT 10/15/2015 [facility]INTER[facility]  . IR GENERIC HISTORICAL  10/20/2015   IR KYPHO LUMBAR INC FX REDUCE BONE BX UNI/BIL  CANNULATION INC/IMAGING 10/20/2015 Luanne Bras, MD [facility]INTER[facility]  . IRRIGATION AND DEBRIDEMENT KNEE Right 02/21/2017   Procedure: IRRIGATION AND DEBRIDEMENT KNEE;  Surgeon: Elsie Saas, MD;  Location: [place];  Service: Orthopedics;  Laterality: Right;  . LUMBAR LAMINECTOMY/DECOMPRESSION MICRODISCECTOMY Left 01/24/2018   Procedure: LEFT LUMBAR FOUR- LUMBAR FIVE  LAMINOTOMY AND MICRODISCECTOMY;  Surgeon: Jovita Gamma, MD;  Location: [place];  Service: Neurosurgery;  Laterality: Left;  LEFT LUMBAR 4- LUMBAR 5  LAMINOTOMY AND MICRODISCECTOMY  . TOTAL HIP ARTHROPLASTY Right 12/16/2014   Procedure: TOTAL HIP ARTHROPLASTY ANTERIOR APPROACH;  Surgeon: Renette Butters, MD;  Location: [place];  Service: Orthopedics;  Laterality: Right;     OB History   No obstetric history on file.     Family History  Problem Relation Age of Onset  . Heart attack Mother 86       heart problems  . Cancer Mother        uterus  . Pancreatitis Sister        died  age 78  . Colon cancer Maternal Aunt 81  . Colon cancer Maternal Aunt 78  . Colon cancer Maternal Aunt 76  . Cancer Other        FH  . Diabetes Other        FH  . Hypertension Other        FH  . Diabetes Son   . Diabetes Daughter   . Colon cancer Maternal Uncle 56    Social History   Tobacco Use  . Smoking status: Former Smoker    Packs/day: 1.00    Years: 15.00    Pack years: 15.00  . Smokeless tobacco: Never Used  . Tobacco comment: quit smoking 30+yrs ago  Substance Use Topics  . Alcohol use: Yes    Alcohol/week: 3.0 standard drinks    Types: 3 Glasses of wine per week    Comment: socially wine  . Drug use: No    Home Medications Prior to Admission medications   Medication Sig Start Date End Date Taking? Authorizing Provider  albuterol (PROAIR HFA) 108 (90 Base) MCG/ACT inhaler Inhale 1 puff into the lungs every 4 (four) hours as needed for wheezing. 10/14/15   [provider]  alendronate  (FOSAMAX) 70 MG tablet Take 70 mg by mouth once a week. Saturdays 11/18/16   [provider]  allopurinol (ZYLOPRIM) 100 MG tablet Take 200 mg by mouth daily.    [provider]  ALPRAZolam Duanne Moron) 0.5 MG tablet Take 0.5 mg by mouth 2 (two) times daily as needed. Anxiety/sleep aid    [provider]  amLODipine-valsartan (EXFORGE) 5-320 MG tablet Take 1 tablet by mouth daily.    [provider]  aspirin 81 MG tablet Take 81 mg by mouth daily.    [provider]  chlorthalidone (HYGROTON) 25 MG tablet Take 25 mg by mouth daily.    [provider]  citalopram (CELEXA) 10 MG tablet Take 10 mg by mouth daily.    [provider]  Cyanocobalamin (VITAMIN B-12 IJ) Inject 1 mL as directed every 30 (thirty) days.    [provider]  HYDROcodone-acetaminophen (NORCO/VICODIN) 5-325 MG tablet Take 1-2 tablets by mouth every 4 (four) hours as needed (pain). 01/25/18   Jovita Gamma, MD  metoprolol succinate (TOPROL-XL) 25 MG 24 hr tablet Take 25 mg by mouth daily.      [provider]  pravastatin (PRAVACHOL) 40 MG tablet Take 40 mg by mouth at bedtime.     [provider]    Allergies    Palladium chloride, Statins, Bacitracin, Gold sodium thiosulfate, and Nickel  Review of Systems   Review of Systems  Constitutional: Positive for chills and fatigue. Negative for fever.  Respiratory: Positive for shortness of breath. Negative for cough.   Cardiovascular: Positive for chest pain.  Gastrointestinal: Positive for nausea. Negative for abdominal pain, diarrhea and vomiting.  All other systems reviewed and are negative.   Physical Exam Updated Vital Signs BP (!) 154/61 (BP Location: Right Arm)   Pulse 75   Temp 98.5 F (36.9 C) (Oral)   Resp 18   Ht 5' (1.524 m)   Wt 56.7 kg   SpO2 99%   BMI 24.41 kg/m   Physical Exam Vitals and nursing note reviewed.  Constitutional:      Appearance: She is not  ill-appearing or diaphoretic.  HENT:     Head: Normocephalic and atraumatic.  Eyes:     Conjunctiva/sclera: Conjunctivae normal.  Cardiovascular:     Rate and Rhythm: Normal rate and regular rhythm.     Pulses:          Radial pulses are 2+ on the right side and 2+ on the left side.       Dorsalis pedis pulses are 2+ on the right side and 2+ on the left side.     Heart sounds: Normal heart sounds.  Pulmonary:     Effort: Pulmonary effort is normal.     Breath sounds: Normal breath sounds. No decreased breath sounds, wheezing, rhonchi or rales.     Comments: Able to speak in full sentences without difficulty. No accessory muscle use. Satting 99% on RA. No decrease with ambulation. LCTAB.  Chest:     Chest wall: No tenderness.  Abdominal:     Palpations: Abdomen is soft.     Tenderness: There is no abdominal tenderness. There is no guarding or rebound.  Musculoskeletal:     Cervical back: Neck supple.     Right lower leg: No edema.     Left lower leg: No edema.     Comments: Mild tenderness to calf of RLE. 2+ DP and PT pulse. No obvious edema noted compared to LLE.   Skin:    General: Skin is warm and dry.  Neurological:     Mental Status: She is alert.     ED Results / Procedures / Treatments   Labs (all labs ordered are listed, but only abnormal results are displayed) Labs Reviewed  BASIC METABOLIC PANEL - Abnormal; Notable for the following components:      Result Value   Glucose, Bld 106 (*)    BUN 24 (*)  All other components within normal limits  NOVEL CORONAVIRUS, NAA (HOSP ORDER, SEND-OUT TO REF LAB; TAT 18-24 HRS)  CBC  D-DIMER, QUANTITATIVE (NOT AT [facility])  TROPONIN I (HIGH SENSITIVITY)  TROPONIN I (HIGH SENSITIVITY)    EKG EKG Interpretation  Date/Time:  Monday March 04 2019 11:48:57 EST Ventricular Rate:  77 PR Interval:  186 QRS Duration: 84 QT Interval:  388 QTC Calculation: 439 R Axis:   60 Text Interpretation: Normal sinus rhythm minimal ST  depressions diffusely Confirmed by Sherwood Gambler [phone]) on 03/04/2019 1:44:32 PM   Radiology DG Chest 2 View  Result Date: 03/04/2019 CLINICAL DATA:  Chest pressure and pain EXAM: CHEST - 2 VIEW COMPARISON:  03/04/2015 FINDINGS: Normal heart size. Aortic atherosclerosis. No pleural effusion or edema. The lungs are hyperinflated. No superimposed airspace consolidation. Granuloma identified within the right midlung as before. The visualized osseous structures show no acute or suspicious osseous findings. Previous kyphoplasty of upper lumbar spine fracture. IMPRESSION: 1. No acute cardiopulmonary abnormalities. Electronically Signed   By: Kerby Moors M.D.   On: 03/04/2019 12:27   US Venous Img Lower Right (DVT Study)  Result Date: 03/04/2019 CLINICAL DATA:  Right leg pain EXAM: RIGHT LOWER EXTREMITY VENOUS DOPPLER ULTRASOUND TECHNIQUE: Gray-scale sonography with graded compression, as well as color Doppler and duplex ultrasound were performed to evaluate the lower extremity deep venous systems from the level of the common femoral vein and including the common femoral, femoral, profunda femoral, popliteal and calf veins including the posterior tibial, peroneal and gastrocnemius veins when visible. The superficial great saphenous vein was also interrogated. Spectral Doppler was utilized to evaluate flow at rest and with distal augmentation maneuvers in the common femoral, femoral and popliteal veins. COMPARISON:  None. FINDINGS: Contralateral Common Femoral Vein: Respiratory phasicity is normal and symmetric with the symptomatic side. No evidence of thrombus. Normal compressibility. Common Femoral Vein: No evidence of thrombus. Normal compressibility, respiratory phasicity and response to augmentation. Saphenofemoral Junction: No evidence of thrombus. Normal compressibility and flow on color Doppler imaging. Profunda Femoral Vein: No evidence of thrombus. Normal compressibility and flow on color Doppler  imaging. Femoral Vein: No evidence of thrombus. Normal compressibility, respiratory phasicity and response to augmentation. Popliteal Vein: No evidence of thrombus. Normal compressibility, respiratory phasicity and response to augmentation. Calf Veins: No evidence of thrombus. Normal compressibility and flow on color Doppler imaging. IMPRESSION: No evidence of deep venous thrombosis. Electronically Signed   By: Jerilynn Mages.  Shick M.D.   On: 03/04/2019 15:59    Procedures Procedures (including critical care time)  Medications Ordered in ED Medications - No data to display  ED Course  I have reviewed the triage vital signs and the nursing notes.  Pertinent labs & imaging results that were available during my care of the patient were reviewed by me and considered in my medical decision making (see chart for details).  79 year old female presents the ED today complaining of chest heaviness for the past week, worse today.  States he feels like she cannot take a deep breath in.  Also felt nauseated earlier today prior to arrival.  Sounds very atypical for ACS but will work-up for this at this time.  Patient does report she has had some pain in her right calf for the past week as well, will obtain DVT study.  Will add D-dimer to lab work.  Is denying any COVID-19 positive exposure.  EKG today without any ischemic changes.  Doubt dissection, equal pulses and patient appears very comfortable on exam.  Blood pressure is normotensive.  X-ray clear.  CBC without leukocytosis.  BMP without electrolyte abnormalities today.  D-dimer 0.41.  Initial troponin less than 2.  Repeat troponin also less than 2. Heart score of 4. Do feel very strongly that patient is not having ACS.  Her DVT study has also been negative.  Not feel she needs additional lab work at this time.  Patient advised that she should follow-up with her cardiologist for further evaluation of chest heaviness continues.  We will swab her for Covid on her way out  the door to be discharged.  She is advised that she will need to stay home and self isolate until she receives her results.  She is in agreement with plan and stable for discharge home.   Clinical Course as of Mar 03 1645  Mon Mar 04, 2019  1516 Troponin I (High Sensitivity): <2.0 [MV]  1527 D-Dimer, Quant: 0.41 [MV]    Clinical Course User Index [MV] Eustaquio Maize, PA-C   MDM Rules/Calculators/A&P                       Final Clinical Impression(s) / ED Diagnoses Final diagnoses:  Nonspecific chest pain  Shortness of breath    Rx / DC Orders ED Discharge Orders    None       Discharge Instructions     Your labwork and images were very reassuring today. We have swabbed you for COVID - please stay home and self isolate until you receive your results (this can take 2-3 days to return). You will receive a call IF positive. You can log into Mychart to check your results as well.   Please follow up with your cardiologist. You may also follow up with our cardiologist if yours cannot see you soon.   Please also follow up with your PCP regarding your symptoms.   Return to the ED for any worsening symptoms including worsening pain, worsening shortness of breath, vomiting, dizziness/lightheadedness, ripping back pain, weakness/numbness in your extremities.        Eustaquio Maize, PA-C 03/04/19 1648    Noemi Chapel, MD 03/04/19 [phone]

## 2019-03-04 NOTE — ED Provider Notes (Signed)
Medical screening examination/treatment/procedure(s) were conducted as a shared visit with non-physician practitioner(s) and myself.  I personally evaluated the patient during the encounter.  Clinical Impression:   Final diagnoses:  Nonspecific chest pain  Shortness of breath   This patient is a very pleasant 79 year old female, had been followed by cardiology in the past, states that recently she started to have a slight heaviness or difficulty taking a deep breath, this is been rather constant, she denies any chest pain at this time, denies shortness of breath, denies swelling of her legs of any degree and on my exam is well-appearing with clear lungs, clear heart, normal troponins, negative D-dimer and ultrasound of the legs and is very well-appearing, her chest x-ray, EKG are totally normal, she was given reassurance and can follow-up in the outpatient setting with local cardiology. She is agreeable to the plan   EKG Interpretation  Date/Time:  Monday March 04 2019 11:48:57 EST Ventricular Rate:  77 PR Interval:  186 QRS Duration: 84 QT Interval:  388 QTC Calculation: 439 R Axis:   60 Text Interpretation: Normal sinus rhythm minimal ST depressions diffusely Confirmed by Sherwood Gambler [phone]) on 03/04/2019 1:44:32 PM            Noemi Chapel, MD 03/04/19 1710

## 2019-03-04 NOTE — ED Triage Notes (Signed)
Pt reports chest pressure since this am. Pt reports pressure remains even at rest. Pt reports history of same and intermittent pain last week. Pt reports shortness of breath, nausea.

## 2019-03-05 LAB — NOVEL CORONAVIRUS, NAA (HOSP ORDER, SEND-OUT TO REF LAB; TAT 18-24 HRS): SARS-CoV-2, NAA: NOT DETECTED

## 2019-03-19 DIAGNOSIS — M1711 Unilateral primary osteoarthritis, right knee: Secondary | ICD-10-CM | POA: Diagnosis not present

## 2019-03-26 DIAGNOSIS — M170 Bilateral primary osteoarthritis of knee: Secondary | ICD-10-CM | POA: Diagnosis not present

## 2019-04-02 DIAGNOSIS — M170 Bilateral primary osteoarthritis of knee: Secondary | ICD-10-CM | POA: Diagnosis not present

## 2019-04-08 DIAGNOSIS — Z23 Encounter for immunization: Secondary | ICD-10-CM | POA: Diagnosis not present

## 2019-04-09 DIAGNOSIS — M1711 Unilateral primary osteoarthritis, right knee: Secondary | ICD-10-CM | POA: Diagnosis not present

## 2019-04-11 ENCOUNTER — Encounter (INDEPENDENT_AMBULATORY_CARE_PROVIDER_SITE_OTHER): Payer: Self-pay | Admitting: *Deleted

## 2019-04-23 DIAGNOSIS — M25461 Effusion, right knee: Secondary | ICD-10-CM | POA: Diagnosis not present

## 2019-04-23 DIAGNOSIS — E663 Overweight: Secondary | ICD-10-CM | POA: Diagnosis not present

## 2019-04-23 DIAGNOSIS — M7989 Other specified soft tissue disorders: Secondary | ICD-10-CM | POA: Diagnosis not present

## 2019-04-23 DIAGNOSIS — M109 Gout, unspecified: Secondary | ICD-10-CM | POA: Diagnosis not present

## 2019-05-08 DIAGNOSIS — I10 Essential (primary) hypertension: Secondary | ICD-10-CM | POA: Diagnosis not present

## 2019-05-08 DIAGNOSIS — R079 Chest pain, unspecified: Secondary | ICD-10-CM | POA: Diagnosis not present

## 2019-05-08 DIAGNOSIS — F419 Anxiety disorder, unspecified: Secondary | ICD-10-CM | POA: Diagnosis not present

## 2019-06-29 DIAGNOSIS — M1712 Unilateral primary osteoarthritis, left knee: Secondary | ICD-10-CM | POA: Diagnosis not present

## 2019-07-10 ENCOUNTER — Ambulatory Visit (INDEPENDENT_AMBULATORY_CARE_PROVIDER_SITE_OTHER): Payer: Medicare Other | Admitting: Gastroenterology

## 2019-08-20 DIAGNOSIS — M1711 Unilateral primary osteoarthritis, right knee: Secondary | ICD-10-CM | POA: Diagnosis not present

## 2019-08-28 ENCOUNTER — Ambulatory Visit (INDEPENDENT_AMBULATORY_CARE_PROVIDER_SITE_OTHER): Payer: Medicare Other | Admitting: Cardiology

## 2019-08-28 ENCOUNTER — Other Ambulatory Visit: Payer: Self-pay

## 2019-08-28 VITALS — BP 136/64 | HR 62 | Ht 60.0 in | Wt 126.0 lb

## 2019-08-28 DIAGNOSIS — Z01810 Encounter for preprocedural cardiovascular examination: Secondary | ICD-10-CM

## 2019-08-28 NOTE — Progress Notes (Signed)
Clinical Summary Mackenzie [name] is a 79 y.o.female seen today as a new patient for the following medical problems.    1. Preoperative evaluation - limited by knee pain - highest activity is doing housework. Sweeps, vaccums, mops without issues.  - chronic SOB that is unchanged.    2. History of DOE - negative Lexiscan stress test in 2013 and negative stress echocardiogram in 2014 - cath 2017 without significant disease - no recent chest pains.      Past Medical History:  Diagnosis Date  . Anemia, normocytic normochromic 01/2012   hx of  . Anxiety    takes Xanax daily as needed  . Arthritis    knees, hands  . Cataract    right eye  . Depression    takes Citalopram daily  . Dizziness    cardiologist is aware and told pt it was related to meds  . History of bronchitis    2014  . History of colon polyps    benign  . History of migraine   . Hyperlipidemia    takes Pravastatin daily  . Hypertension    takes Metoprolol and Azor daily  . Joint pain   . Pneumonia    hx of-2015  . Pseudogout of knee, right 02/22/2017  . Renal insufficiency 02/22/2017  . Rheumatic fever    hx of  . Wheezing on expiration      Allergies  Allergen Reactions  . Palladium Chloride Other (See Comments)    Positive patch test   . Statins Other (See Comments)    Atorvastatin, pravastatin, simvastatin->myalgias   . Bacitracin Rash  . Gold Sodium Thiosulfate Rash  . Nickel Itching and Rash    Possibility...still trying to figure out      Current Outpatient Medications  Medication Sig Dispense Refill  . albuterol (PROAIR HFA) 108 (90 Base) MCG/ACT inhaler Inhale 1 puff into the lungs every 4 (four) hours as needed for wheezing.    Marland Kitchen alendronate (FOSAMAX) 70 MG tablet Take 70 mg by mouth once a week. Saturdays    . allopurinol (ZYLOPRIM) 100 MG tablet Take 200 mg by mouth daily.    Marland Kitchen ALPRAZolam (XANAX) 0.5 MG tablet Take 0.5 mg by mouth 2 (two) times daily as needed. Anxiety/sleep aid     . amLODipine-valsartan (EXFORGE) 5-320 MG tablet Take 1 tablet by mouth daily.    Marland Kitchen aspirin 81 MG tablet Take 81 mg by mouth daily.    . chlorthalidone (HYGROTON) 25 MG tablet Take 25 mg by mouth daily.    . citalopram (CELEXA) 10 MG tablet Take 10 mg by mouth daily.    . Cyanocobalamin (VITAMIN B-12 IJ) Inject 1 mL as directed every 30 (thirty) days.    Marland Kitchen HYDROcodone-acetaminophen (NORCO/VICODIN) 5-325 MG tablet Take 1-2 tablets by mouth every 4 (four) hours as needed (pain). 35 tablet 0  . metoprolol succinate (TOPROL-XL) 25 MG 24 hr tablet Take 25 mg by mouth daily.      . pravastatin (PRAVACHOL) 40 MG tablet Take 40 mg by mouth at bedtime.      No current facility-administered medications for this visit.   Facility-Administered Medications Ordered in Other Visits  Medication Dose Route Frequency Provider Last Rate Last Admin  . 0.9 %  sodium chloride infusion   Intravenous Continuous Aundra Dubin, PA-C      . acetaminophen (TYLENOL) tablet 650 mg  650 mg Oral Q6H PRN Aundra Dubin, PA-C  Or  . acetaminophen (TYLENOL) suppository 650 mg  650 mg Rectal Q6H PRN Nathaniel Man         Past Surgical History:  Procedure Laterality Date  . ABDOMINAL HYSTERECTOMY    . APPENDECTOMY    . CARDIAC CATHETERIZATION     early 2000's  . CARDIAC CATHETERIZATION N/A 03/04/2015   Procedure: Left Heart Cath and Coronary Angiography;  Surgeon: Sherren Mocha, MD;  Location: [facility];  Service: Cardiovascular;  Laterality: N/A;  . cataract surgery Left   . CHOLECYSTECTOMY    . CHONDROPLASTY Right 02/21/2017   Procedure: CHONDROPLASTY WITH DEBRIDEMENT;  Surgeon: Elsie Saas, MD;  Location: [place] [facility];  Service: Orthopedics;  Laterality: Right;  . COLONOSCOPY  2005   Negative screening study  . COLONOSCOPY N/A 04/04/2014   Procedure: COLONOSCOPY;  Surgeon: Rogene Houston, MD;  [facility];  Service: Endoscopy;  Laterality: N/A;  1225  .  EXPLORATORY LAPAROTOMY     x 3  . IR GENERIC HISTORICAL  10/15/2015   IR RADIOLOGIST EVAL & MGMT 10/15/2015 [facility]  . IR GENERIC HISTORICAL  10/20/2015   IR KYPHO LUMBAR INC FX REDUCE BONE BX UNI/BIL CANNULATION INC/IMAGING 10/20/2015 Luanne Bras, MD [facility]  . IRRIGATION AND DEBRIDEMENT KNEE Right 02/21/2017   Procedure: IRRIGATION AND DEBRIDEMENT KNEE;  Surgeon: Elsie Saas, MD;  Location: [place] [place];  Service: Orthopedics;  Laterality: Right;  . LUMBAR LAMINECTOMY/DECOMPRESSION MICRODISCECTOMY Left 01/24/2018   Procedure: LEFT LUMBAR FOUR- LUMBAR FIVE  LAMINOTOMY AND MICRODISCECTOMY;  Surgeon: Jovita Gamma, MD;  Location: [place];  Service: Neurosurgery;  Laterality: Left;  LEFT LUMBAR 4- LUMBAR 5  LAMINOTOMY AND MICRODISCECTOMY  . TOTAL HIP ARTHROPLASTY Right 12/16/2014   Procedure: TOTAL HIP ARTHROPLASTY ANTERIOR APPROACH;  Surgeon: Renette Butters, MD;  Location: [place];  Service: Orthopedics;  Laterality: Right;     Allergies  Allergen Reactions  . Palladium Chloride Other (See Comments)    Positive patch test   . Statins Other (See Comments)    Atorvastatin, pravastatin, simvastatin->myalgias   . Bacitracin Rash  . Gold Sodium Thiosulfate Rash  . Nickel Itching and Rash    Possibility...still trying to figure out       Family History  Problem Relation Age of Onset  . Heart attack Mother 51       heart problems  . Cancer Mother        uterus  . Pancreatitis Sister        died  age 21  . Colon cancer Maternal Aunt 81  . Colon cancer Maternal Aunt 78  . Colon cancer Maternal Aunt 76  . Cancer Other        FH  . Diabetes Other        FH  . Hypertension Other        FH  . Diabetes Son   . Diabetes Daughter   . Colon cancer Maternal Uncle 87     Social History Mackenzie [name] reports that she has quit smoking. She has a 15.00 pack-year smoking history. She has never used smokeless tobacco. Mackenzie [name] reports current alcohol use of about  3.0 standard drinks of alcohol per week.   Review of Systems CONSTITUTIONAL: No weight loss, fever, chills, weakness or fatigue.  HEENT: Eyes: No visual loss, blurred vision, double vision or yellow sclerae.No hearing loss, sneezing, congestion, runny nose or sore throat.  SKIN: No rash or itching.  CARDIOVASCULAR: per  hpi RESPIRATORY: No shortness of breath, cough or sputum.  GASTROINTESTINAL: No anorexia, nausea, vomiting or diarrhea. No abdominal pain or blood.  GENITOURINARY: No burning on urination, no polyuria NEUROLOGICAL: No headache, dizziness, syncope, paralysis, ataxia, numbness or tingling in the extremities. No change in bowel or bladder control.  MUSCULOSKELETAL: knee pain  LYMPHATICS: No enlarged nodes. No history of splenectomy.  PSYCHIATRIC: No history of depression or anxiety.  ENDOCRINOLOGIC: No reports of sweating, cold or heat intolerance. No polyuria or polydipsia.  Marland Kitchen   Physical Examination Today's Vitals   08/28/19 1327  BP: 136/64  Pulse: 62  SpO2: 98%  Weight: 126 lb (57.2 kg)  Height: 5' (1.524 m)   Body mass index is 24.61 kg/m.  Gen: resting comfortably, no acute distress HEENT: no scleral icterus, pupils equal round and reactive, no palptable cervical adenopathy,  CV: RRR, no m/r/g, no jvd Resp: Clear to auscultation bilaterally GI: abdomen is soft, non-tender, non-distended, normal bowel sounds, no hepatosplenomegaly MSK: extremities are warm, no edema.  Skin: warm, no rash Neuro:  no focal deficits Psych: appropriate affect   Diagnostic Studies  Jan 2017 cath 1. Minor nonobstructive CAD as detailed below with calcified coronary arteries but no obstruction 2. Normal LV function      Assessment and Plan  1. Preoperative evaluation - exertion is limited by chronic knee pains. Tolerates mild to moderate house work without issues - extensive cardiac testing over the years, multiple negative stress tests along with a cath in 2017 without  significant disease - no significaht chest pains, chronic SOB that is unchanged over several years - given normal cath in 2017 without new significant symptoms would recommend proceeding with surgery as planned. No plans for repeat cardiac testing prior to surgery    F/u as needed  Arnoldo Lenis, M.D.,

## 2019-08-28 NOTE — Patient Instructions (Signed)
Medication Instructions:  ° ° °Your physician recommends that you continue on your current medications as directed. Please refer to the Current Medication list given to you today. ° ° °*If you need a refill on your cardiac medications before your next appointment, please call your pharmacy* ° ° °Lab Work: NONE ORDERED  TODAY ° ° °If you have labs (blood work) drawn today and your tests are completely normal, you will receive your results only by: °MyChart Message (if you have MyChart) OR °A paper copy in the mail °If you have any lab test that is abnormal or we need to change your treatment, we will call you to review the results. ° ° °Testing/Procedures: NONE ORDERED  TODAY ° ° °Follow-Up: °At [facility], you and your health needs are our priority.  As part of our continuing mission to provide you with exceptional heart care, we have created designated Provider Care Teams.  These Care Teams include your primary Cardiologist (physician) and Advanced Practice Providers (APPs -  Physician Assistants and Nurse Practitioners) who all work together to provide you with the care you need, when you need it. ° °We recommend signing up for the patient portal called "MyChart".  Sign up information is provided on this After Visit Summary.  MyChart is used to connect with patients for Virtual Visits (Telemedicine).  Patients are able to view lab/test results, encounter notes, upcoming appointments, etc.  Non-urgent messages can be sent to your provider as well.   °To learn more about what you can do with MyChart, go to [email or web address].   ° °Your next appointment:   ° °CONTACT [facility] [phone] AS NEEDED FOR  ANY CARDIAC RELATED SYMPTOMS °}  ° ° °Other Instructions ° °

## 2019-09-03 ENCOUNTER — Telehealth: Payer: Self-pay | Admitting: *Deleted

## 2019-09-03 NOTE — Telephone Encounter (Signed)
   Primary Cardiologist: Dr Harl Bowie  Chart reviewed as part of pre-operative protocol coverage. Given past medical history and time since last visit, based on ACC/AHA guidelines, Mackenzie [name] would be at acceptable risk for the planned procedure without further cardiovascular testing.   OK to hold aspirin 5-7 days pre op if needed.  I will route this recommendation to the requesting party via Epic fax function and remove from pre-op pool.  Please call with questions.  Kerin Ransom, PA-C 09/03/2019, 2:27 PM

## 2019-09-03 NOTE — Telephone Encounter (Signed)
   [facility] Pre-operative Risk Assessment    HEARTCARE STAFF: - Please ensure there is not already an duplicate clearance open for this procedure. - Under Visit Info/Reason for Call, type in Other and utilize the format Clearance MM/DD/YY or Clearance TBD. Do not use dashes or single digits. - If request is for dental extraction, please clarify the # of teeth to be extracted.  Request for surgical clearance:  1. What type of surgery is being performed? RIGHT TOTAL KNEE REPLACEMENT   2. When is this surgery scheduled? TBD   3. What type of clearance is required (medical clearance vs. Pharmacy clearance to hold med vs. Both)? MEDICAL  4. Are there any medications that need to be held prior to surgery and how long? ASA    5. Practice name and name of physician performing surgery? [facility]; DR. Elsie Saas   6. What is the office phone number? [phone]   7.   What is the office fax number? [email or web address].   Anesthesia type (None, local, MAC, general) ? CHOICE   Julaine Hua 09/03/2019, 12:39 PM  _________________________________________________________________   (provider comments below)

## 2019-09-16 DIAGNOSIS — M1991 Primary osteoarthritis, unspecified site: Secondary | ICD-10-CM | POA: Diagnosis not present

## 2019-09-16 DIAGNOSIS — I10 Essential (primary) hypertension: Secondary | ICD-10-CM | POA: Diagnosis not present

## 2019-09-16 DIAGNOSIS — F419 Anxiety disorder, unspecified: Secondary | ICD-10-CM | POA: Diagnosis not present

## 2019-09-24 DIAGNOSIS — M1711 Unilateral primary osteoarthritis, right knee: Secondary | ICD-10-CM | POA: Diagnosis not present

## 2019-10-01 ENCOUNTER — Encounter: Payer: Self-pay | Admitting: Orthopedic Surgery

## 2019-10-01 DIAGNOSIS — M1711 Unilateral primary osteoarthritis, right knee: Secondary | ICD-10-CM

## 2019-10-01 HISTORY — DX: Unilateral primary osteoarthritis, right knee: M17.11

## 2019-10-01 NOTE — H&P (Signed)
TOTAL KNEE ADMISSION H&P  Patient is being admitted for right total knee arthroplasty.  Subjective:  Chief Complaint:right knee pain.  HPI: Mackenzie [name], 79 y.o. female, has a history of pain and functional disability in the right knee due to arthritis and has failed non-surgical conservative treatments for greater than 12 weeks to includeNSAID's and/or analgesics, corticosteriod injections, viscosupplementation injections, flexibility and strengthening excercises, supervised PT with diminished ADL's post treatment, use of assistive devices and activity modification.  Onset of symptoms was gradual, starting 10 years ago with gradually worsening course since that time. The patient noted prior procedures on the knee to include  arthroscopy, menisectomy and i and d on the right knee(s).  Patient currently rates pain in the right knee(s) at 10 out of 10 with activity. Patient has night pain, worsening of pain with activity and weight bearing, pain that interferes with activities of daily living, crepitus and joint swelling.  Patient has evidence of subchondral sclerosis, periarticular osteophytes and joint space narrowing by imaging studies.There is no active infection.  Patient Active Problem List   Diagnosis Date Noted   Lumbar disc herniation 01/24/2018   Constipation 03/09/2017   Pseudogout of knee, right 02/22/2017   Renal insufficiency 02/22/2017   Septic joint of right knee joint ([place]) 02/21/2017   Infection of right knee ([place]) 02/06/2017   Septic arthritis ([place]) 02/06/2017   Chest pain 03/04/2015   Essential hypertension    Dyslipidemia    Primary osteoarthritis of right hip 12/16/2014   Postoperative anemia due to acute blood loss 12/16/2014   Bilateral carotid artery disease ([place]) 11/05/2014   Multiple fractures of ribs of right side 06/18/2012   Pneumothorax on right 06/18/2012   Unstable angina (HCC) 01/31/2012   Cerebrovascular disease 01/31/2012    Arteriosclerotic cardiovascular disease (ASCVD)    Hyperlipidemia    Hypertension    Anemia, normocytic normochromic 01/15/2012   MITRAL VALVE PROLAPSE 12/24/2008   Past Medical History:  Diagnosis Date   Anemia, normocytic normochromic 01/2012   hx of   Anxiety    takes Xanax daily as needed   Arthritis    knees, hands   Cataract    right eye   Depression    takes Citalopram daily   Dizziness    cardiologist is aware and told pt it was related to meds   History of bronchitis    2014   History of colon polyps    benign   History of migraine    Hyperlipidemia    takes Pravastatin daily   Hypertension    takes Metoprolol and Azor daily   Joint pain    Pneumonia    hx of-2015   Pseudogout of knee, right 02/22/2017   Renal insufficiency 02/22/2017   Rheumatic fever    hx of   Wheezing on expiration     Past Surgical History:  Procedure Laterality Date   ABDOMINAL HYSTERECTOMY     APPENDECTOMY     CARDIAC CATHETERIZATION     early 2000's   CARDIAC CATHETERIZATION N/A 03/04/2015   Procedure: Left Heart Cath and Coronary Angiography;  Surgeon: Sherren Mocha, MD;  Location: [facility];  Service: Cardiovascular;  Laterality: N/A;   cataract surgery Left    CHOLECYSTECTOMY     CHONDROPLASTY Right 02/21/2017   Procedure: CHONDROPLASTY WITH DEBRIDEMENT;  Surgeon: Elsie Saas, MD;  Location: [place];  Service: Orthopedics;  Laterality: Right;   COLONOSCOPY  2005   Negative screening study  COLONOSCOPY N/A 04/04/2014   Procedure: COLONOSCOPY;  Surgeon: Rogene Houston, MD;  [facility];  Service: Endoscopy;  Laterality: N/A;  1225   EXPLORATORY LAPAROTOMY     x 3   IR GENERIC HISTORICAL  10/15/2015   IR RADIOLOGIST EVAL & MGMT 10/15/2015 [facility]   IR GENERIC HISTORICAL  10/20/2015   IR KYPHO LUMBAR INC FX REDUCE BONE BX UNI/BIL CANNULATION INC/IMAGING 10/20/2015 Luanne Bras, MD [facility]    IRRIGATION AND DEBRIDEMENT KNEE Right 02/21/2017   Procedure: IRRIGATION AND DEBRIDEMENT KNEE;  Surgeon: Elsie Saas, MD;  Location: [place];  Service: Orthopedics;  Laterality: Right;   LUMBAR LAMINECTOMY/DECOMPRESSION MICRODISCECTOMY Left 01/24/2018   Procedure: LEFT LUMBAR FOUR- LUMBAR FIVE  LAMINOTOMY AND MICRODISCECTOMY;  Surgeon: Jovita Gamma, MD;  Location: [place];  Service: Neurosurgery;  Laterality: Left;  LEFT LUMBAR 4- LUMBAR 5  LAMINOTOMY AND MICRODISCECTOMY   TOTAL HIP ARTHROPLASTY Right 12/16/2014   Procedure: TOTAL HIP ARTHROPLASTY ANTERIOR APPROACH;  Surgeon: Renette Butters, MD;  Location: [place];  Service: Orthopedics;  Laterality: Right;    No current facility-administered medications for this encounter.   Current Outpatient Medications  Medication Sig Dispense Refill Last Dose   alendronate (FOSAMAX) 70 MG tablet Take 70 mg by mouth once a week. Tuesday   10/01/2019 at Unknown time   ALPRAZolam (XANAX) 0.25 MG tablet Take 0.25 mg by mouth 2 (two) times daily as needed.   Past Week at Unknown time   amLODipine (NORVASC) 5 MG tablet Take 5 mg by mouth daily.   10/01/2019 at Unknown time   aspirin 81 MG tablet Take 81 mg by mouth daily.   10/01/2019 at Unknown time   chlorthalidone (HYGROTON) 25 MG tablet Take 25 mg by mouth daily.   10/01/2019 at Unknown time   citalopram (CELEXA) 10 MG tablet Take 10 mg by mouth daily.   10/01/2019 at Unknown time   Cyanocobalamin (VITAMIN B-12 IJ) Inject 1 mL as directed every 30 (thirty) days.   10/01/2019 at Unknown time   metoprolol succinate (TOPROL-XL) 25 MG 24 hr tablet Take 25 mg by mouth daily.     10/01/2019 at Unknown time   pravastatin (PRAVACHOL) 40 MG tablet Take 40 mg by mouth at bedtime.    10/01/2019 at Unknown time   albuterol (PROAIR HFA) 108 (90 Base) MCG/ACT inhaler Inhale 1 puff into the lungs every 4 (four) hours as needed for wheezing.   More than a month at Unknown time   Facility-Administered  Medications Ordered in Other Encounters  Medication Dose Route Frequency Provider Last Rate Last Admin   0.9 %  sodium chloride infusion   Intravenous Continuous Aundra Dubin, PA-C       acetaminophen (TYLENOL) tablet 650 mg  650 mg Oral Q6H PRN Aundra Dubin, PA-C       Or   acetaminophen (TYLENOL) suppository 650 mg  650 mg Rectal Q6H PRN Aundra Dubin, PA-C       Allergies  Allergen Reactions   Palladium Chloride Other (See Comments)    Positive patch test    Statins Other (See Comments)    Atorvastatin, pravastatin, simvastatin->myalgias    Bacitracin Rash   Gold Sodium Thiosulfate Rash   Nickel Itching and Rash    Possibility...still trying to figure out     Social History   Tobacco Use   Smoking status: Former Smoker    Packs/day: 1.00    Years: 15.00  Pack years: 15.00   Smokeless tobacco: Never Used   Tobacco comment: quit smoking 30+yrs ago  Substance Use Topics   Alcohol use: Yes    Alcohol/week: 3.0 standard drinks    Types: 3 Glasses of wine per week    Comment: socially wine    Family History  Problem Relation Age of Onset   Heart attack Mother 32       heart problems   Cancer Mother        uterus   Pancreatitis Sister        died  age 43   Colon cancer Maternal Aunt 81   Colon cancer Maternal Aunt 78   Colon cancer Maternal Aunt 76   Cancer Other        FH   Diabetes Other        FH   Hypertension Other        FH   Diabetes Son    Diabetes Daughter    Colon cancer Maternal Uncle 40     Review of Systems  Constitutional: Negative.   HENT: Negative.   Eyes: Negative.   Respiratory: Negative.   Cardiovascular: Negative.   Gastrointestinal: Negative.   Endocrine: Negative.   Genitourinary: Negative.   Musculoskeletal: Positive for back pain, gait problem, joint swelling and myalgias.  Allergic/Immunologic: Negative.   Hematological: Negative.   Psychiatric/Behavioral: Negative.      Objective:  Physical Exam Constitutional:      Appearance: Normal appearance.  HENT:     Head: Normocephalic and atraumatic.     Right Ear: External ear normal.     Left Ear: External ear normal.     Nose: Nose normal.     Mouth/Throat:     Pharynx: Oropharynx is clear.  Eyes:     Extraocular Movements: Extraocular movements intact.     Conjunctiva/sclera: Conjunctivae normal.  Cardiovascular:     Rate and Rhythm: Normal rate.     Pulses: Normal pulses.  Pulmonary:     Effort: Pulmonary effort is normal.  Abdominal:     General: Bowel sounds are normal.     Palpations: Abdomen is soft.  Musculoskeletal:     Cervical back: Neck supple.     Comments: Examination of her right knee reveals pain medially and laterally.  1+ crepitation.  1+ synovitis.  Range of motion -10-120 degrees.  Knee is stable with normal patella tracking.    Skin:    General: Skin is warm and dry.     Capillary Refill: Capillary refill takes less than 2 seconds.  Neurological:     General: No focal deficit present.     Mental Status: She is alert.  Psychiatric:        Mood and Affect: Mood normal.        Behavior: Behavior normal.     Vital signs in last 24 hours: Temp:  [98 F (36.7 C)] 98 F (36.7 C) (08/17 1200) Pulse Rate:  [54] 54 (08/17 1200) BP: (164)/(70) 164/70 (08/17 1200) SpO2:  [100 %] 100 % (08/17 1200) Weight:  [57.3 kg] 57.3 kg (08/17 1200)  Labs:   Estimated body mass index is 24.69 kg/m as calculated from the following:   Height as of this encounter: 5' (1.524 m).   Weight as of this encounter: 57.3 kg.   Imaging Review Plain radiographs demonstrate severe degenerative joint disease of the right knee(s). The overall alignment issignificant varus. The bone quality appears to be good for age and reported  activity level.      Assessment/Plan:  End stage arthritis, right knee  Principal Problem:   Primary localized osteoarthritis of right knee Active Problems:    Anemia, normocytic normochromic   Arteriosclerotic cardiovascular disease (ASCVD)   Hypertension   Essential hypertension   Septic joint of right knee joint (HCC)   Lumbar disc herniation   The patient history, physical examination, clinical judgment of the provider and imaging studies are consistent with end stage degenerative joint disease of the right knee(s) and total knee arthroplasty is deemed medically necessary. The treatment options including medical management, injection therapy arthroscopy and arthroplasty were discussed at length. The risks and benefits of total knee arthroplasty were presented and reviewed. The risks due to aseptic loosening, infection, stiffness, patella tracking problems, thromboembolic complications and other imponderables were discussed. The patient acknowledged the explanation, agreed to proceed with the plan and consent was signed. Patient is being admitted for inpatient treatment for surgery, pain control, PT, OT, prophylactic antibiotics, VTE prophylaxis, progressive ambulation and ADL's and discharge planning. The patient is planning to be discharged to home   She will start outpatient physical at [facility] with Verl Dicker on Sept 1.    Will need antibiotics in the cement due to her history of a septic knee.    History

## 2019-10-07 NOTE — Care Plan (Signed)
Ortho Bundle Case Management Note  Patient Details  Name: Mackenzie [name] MRN: [id number] Date of Birth: December 27, 1940     Spoke with patient prior to surgery. She will discharge to home with family and friends to assist. CPM ordered. She has a rolling walker at home. OPPT set up with [facility]. Patient and MD in agreement with plan. Choice offered.                 DME Arranged:  [facility] DME Agency:  [facility]  HH Arranged:    [place] Agency:     Additional Comments: Please contact me with any questions of if this plan should need to change.  Ladell Heads,  [place] [facility]  [phone] 10/07/2019, 12:42 PM

## 2019-10-08 ENCOUNTER — Other Ambulatory Visit (HOSPITAL_COMMUNITY)
Admission: RE | Admit: 2019-10-08 | Discharge: 2019-10-08 | Disposition: A | Discharge status: Home / Self Care | Payer: Medicare Other | Source: Ambulatory Visit | Attending: Orthopedic Surgery | Admitting: Orthopedic Surgery

## 2019-10-08 DIAGNOSIS — Z01812 Encounter for preprocedural laboratory examination: Secondary | ICD-10-CM | POA: Insufficient documentation

## 2019-10-08 DIAGNOSIS — Z20822 Contact with and (suspected) exposure to covid-19: Secondary | ICD-10-CM | POA: Insufficient documentation

## 2019-10-08 LAB — SARS CORONAVIRUS 2 (TAT 6-24 HRS): SARS Coronavirus 2: NEGATIVE

## 2019-10-08 NOTE — Patient Instructions (Addendum)
DUE TO COVID-19 ONLY ONE VISITOR IS ALLOWED TO COME WITH YOU AND STAY IN THE WAITING ROOM ONLY DURING PRE OP AND PROCEDURE DAY OF SURGERY. THE 1 VISITOR  MAY VISIT WITH YOU AFTER SURGERY IN YOUR PRIVATE ROOM DURING VISITING HOURS ONLY!  YOU NEED TO HAVE A COVID 19 TEST ON 10-08-19. PLEASE BEGIN THE QUARANTINE INSTRUCTIONS AS OUTLINED IN YOUR HANDOUT.                Mackenzie [name]  10/08/2019   Your procedure is scheduled on: 10-11-19   Report to [facility] Main  Entrance    Report to Admitting at 6:15 AM     Call this number if you have problems the morning of surgery [phone]    REMEMBER:  Liberal 5:45 AM  . PLEASE FINISH ENSURE DRINK PER SURGEON ORDER  WHICH NEEDS TO BE COMPLETED AT  5:45 AM .      CLEAR LIQUID DIET   Foods Allowed                                                                    Coffee and tea, regular and decaf                            Fruit ices (not with fruit pulp)                                      Iced Popsicles                                    Carbonated beverages, regular and diet                                    Cranberry, grape and apple juices Sports drinks like Gatorade Lightly seasoned clear broth or consume(fat free) Sugar, honey syrup ___________________________________________________________________      BRUSH YOUR TEETH MORNING OF SURGERY AND RINSE YOUR MOUTH OUT, NO CHEWING GUM CANDY OR MINTS.     Take these medicines the morning of surgery with A SIP OF WATER: Alprazolam (Xanax), Amlodipine (Norvasc), Citalopram (Celexa), and Metoprolol                                 You may not have any metal on your body including hair pins and              piercings     Do not wear jewelry, make-up, lotions, powders or perfumes, deodorant              Do not wear nail polish on your fingernails.  Do not shave  48 hours prior to surgery.                 Do not bring valuables to the  hospital. [facility] IS NOT  RESPONSIBLE   FOR VALUABLES.  Contacts, dentures or bridgework may not be worn into surgery.  You may bring in a small overnight bag    Special Instructions: N/A              Please read over the following fact sheets you were given: _____________________________________________________________________  [facility] - Preparing for Surgery Before surgery, you can play an important role.  Because skin is not sterile, your skin needs to be as free of germs as possible.  You can reduce the number of germs on your skin by washing with CHG (chlorahexidine gluconate) soap before surgery.  CHG is an antiseptic cleaner which kills germs and bonds with the skin to continue killing germs even after washing. Please DO NOT use if you have an allergy to CHG or antibacterial soaps.  If your skin becomes reddened/irritated stop using the CHG and inform your nurse when you arrive at Short Stay. Do not shave (including legs and underarms) for at least 48 hours prior to the first CHG shower.  You may shave your face/neck. Please follow these instructions carefully:  1.  Shower with CHG Soap the night before surgery and the  morning of Surgery.  2.  If you choose to wash your hair, wash your hair first as usual with your  normal  shampoo.  3.  After you shampoo, rinse your hair and body thoroughly to remove the  shampoo.                           4.  Use CHG as you would any other liquid soap.  You can apply chg directly  to the skin and wash                       Gently with a scrungie or clean washcloth.  5.  Apply the CHG Soap to your body ONLY FROM THE NECK DOWN.   Do not use on face/ open                           Wound or open sores. Avoid contact with eyes, ears mouth and genitals (private parts).                       Wash face,  Genitals (private parts) with your normal soap.             6.  Wash thoroughly, paying special attention to the area where your surgery   will be performed.  7.  Thoroughly rinse your body with warm water from the neck down.  8.  DO NOT shower/wash with your normal soap after using and rinsing off  the CHG Soap.                9.  Pat yourself dry with a clean towel.            10.  Wear clean pajamas.            11.  Place clean sheets on your bed the night of your first shower and do not  sleep with pets. Day of Surgery : Do not apply any lotions/deodorants the morning of surgery.  Please wear clean clothes to the hospital/surgery center.  FAILURE TO FOLLOW THESE INSTRUCTIONS MAY RESULT IN THE CANCELLATION OF YOUR SURGERY PATIENT SIGNATURE_________________________________  NURSE SIGNATURE__________________________________  ________________________________________________________________________

## 2019-10-08 NOTE — Progress Notes (Addendum)
   COVID Vaccine Completed: Date COVID Vaccine completed: COVID vaccine manufacturer: [employer]   PCP - Asencion Noble, MD  Cardiologist - Dr. Harl Bowie, MD w/ cardiac clearance from 09/03/19 in Epic chart.  Chest x-ray - 03-04-19  EKG - 02-22-19 Stress Test -  ECHO -  Cardiac Cath -   Sleep Study -  CPAP -   Fasting Blood Sugar -  Checks Blood Sugar _____ times a day  Blood Thinner Instructions: Aspirin Instructions : 81mg  ASA. Okay to hold 5-7 prior to surgery, per cardiology- Per pt, she was not aware that she was to hold the ASA. Last Dose:   ADL's w/o SOB Anesthesia review:   Patient denies shortness of breath, fever, cough and chest pain at PAT appointment   Patient verbalized understanding of instructions that were given to them at the PAT appointment. Patient was also instructed that they will need to review over the PAT instructions again at home before surgery.

## 2019-10-08 NOTE — Care Plan (Signed)
Patient's surgery has been rescheduled to Friday 10/11/19. She will stay overnight and discharge to home. OPPT set up with [name] and CPM from [facility]. All parties have been notified of change.   Mackenzie [name], [place]

## 2019-10-09 ENCOUNTER — Other Ambulatory Visit: Payer: Self-pay

## 2019-10-09 ENCOUNTER — Encounter (HOSPITAL_COMMUNITY): Payer: Self-pay

## 2019-10-09 ENCOUNTER — Encounter (HOSPITAL_COMMUNITY)
Admission: RE | Admit: 2019-10-09 | Discharge: 2019-10-09 | Disposition: A | Discharge status: Home / Self Care | Payer: Medicare Other | Source: Ambulatory Visit | Attending: Orthopedic Surgery | Admitting: Orthopedic Surgery

## 2019-10-09 DIAGNOSIS — Z01812 Encounter for preprocedural laboratory examination: Secondary | ICD-10-CM | POA: Insufficient documentation

## 2019-10-09 LAB — COMPREHENSIVE METABOLIC PANEL
ALT: 12 U/L (ref 0–44)
AST: 19 U/L (ref 15–41)
Albumin: 4.3 g/dL (ref 3.5–5.0)
Alkaline Phosphatase: 77 U/L (ref 38–126)
Anion gap: 7 (ref 5–15)
BUN: 22 mg/dL (ref 8–23)
CO2: 28 mmol/L (ref 22–32)
Calcium: 9.3 mg/dL (ref 8.9–10.3)
Chloride: 102 mmol/L (ref 98–111)
Creatinine, Ser: 0.71 mg/dL (ref 0.44–1.00)
GFR calc Af Amer: >60 mL/min (ref >60)
GFR calc non Af Amer: >60 mL/min (ref >60)
Glucose, Bld: 102 mg/dL — ABNORMAL HIGH (ref 70–99)
Potassium: 4.1 mmol/L (ref 3.5–5.1)
Sodium: 137 mmol/L (ref 135–145)
Total Bilirubin: 0.5 mg/dL (ref 0.3–1.2)
Total Protein: 8.1 g/dL (ref 6.5–8.1)

## 2019-10-09 LAB — CBC WITH DIFFERENTIAL/PLATELET
Abs Immature Granulocytes: 0.03 10*3/uL (ref 0.00–0.07)
Basophils Absolute: 0.1 10*3/uL (ref 0.0–0.1)
Basophils Relative: 1 %
Eosinophils Absolute: 0.8 10*3/uL — ABNORMAL HIGH (ref 0.0–0.5)
Eosinophils Relative: 9 %
HCT: 39.1 % (ref 36.0–46.0)
Hemoglobin: 12.6 g/dL (ref 12.0–15.0)
Immature Granulocytes: 0 %
Lymphocytes Relative: 21 %
Lymphs Abs: 1.7 10*3/uL (ref 0.7–4.0)
MCH: 31.2 pg (ref 26.0–34.0)
MCHC: 32.2 g/dL (ref 30.0–36.0)
MCV: 96.8 fL (ref 80.0–100.0)
Monocytes Absolute: 0.9 10*3/uL (ref 0.1–1.0)
Monocytes Relative: 11 %
Neutro Abs: 4.8 10*3/uL (ref 1.7–7.7)
Neutrophils Relative %: 58 %
Platelets: 366 10*3/uL (ref 150–400)
RBC: 4.04 MIL/uL (ref 3.87–5.11)
RDW: 12.7 % (ref 11.5–15.5)
WBC: 8.2 10*3/uL (ref 4.0–10.5)
nRBC: 0 % (ref 0.0–0.2)

## 2019-10-09 LAB — SURGICAL PCR SCREEN
MRSA, PCR: NEGATIVE
Staphylococcus aureus: NEGATIVE

## 2019-10-09 LAB — PROTIME-INR
INR: 0.9 (ref 0.8–1.2)
Prothrombin Time: 12.2 seconds (ref 11.4–15.2)

## 2019-10-09 LAB — APTT: aPTT: 27 seconds (ref 24–36)

## 2019-10-10 LAB — URINE CULTURE

## 2019-10-10 MED ORDER — BUPIVACAINE LIPOSOME 1.3 % IJ SUSP
20.0000 mL | Freq: Once | INTRAMUSCULAR | Status: DC
  Filled 2019-10-10: qty 20

## 2019-10-10 NOTE — Progress Notes (Addendum)
10-09-19 Urine culture "multiple species present, suggest recollection". Results routed to surgeon's office, as well as spoke to Konrad Felix, [place] regarding urine result.   Per Konrad Felix, PA,  place order for recollection for day of surgery. hat way if culture yields positive results, an anbitiotic can be potentially ordered after surgery. Order for urine culture' placed.

## 2019-10-11 ENCOUNTER — Encounter (HOSPITAL_COMMUNITY)
Admission: RE | Disposition: A | Discharge status: Home / Self Care | Payer: Self-pay | Source: Ambulatory Visit | Attending: Orthopedic Surgery

## 2019-10-11 ENCOUNTER — Ambulatory Visit (HOSPITAL_COMMUNITY): Payer: Medicare Other | Admitting: Physician Assistant

## 2019-10-11 ENCOUNTER — Encounter (HOSPITAL_COMMUNITY): Payer: Self-pay | Admitting: Orthopedic Surgery

## 2019-10-11 ENCOUNTER — Other Ambulatory Visit: Payer: Self-pay

## 2019-10-11 ENCOUNTER — Observation Stay (HOSPITAL_COMMUNITY)
Admission: RE | Admit: 2019-10-11 | Discharge: 2019-10-12 | Disposition: A | Discharge status: Home / Self Care | Payer: Medicare Other | Source: Ambulatory Visit | Attending: Orthopedic Surgery | Admitting: Orthopedic Surgery

## 2019-10-11 ENCOUNTER — Ambulatory Visit (HOSPITAL_COMMUNITY): Payer: Medicare Other | Admitting: Registered Nurse

## 2019-10-11 DIAGNOSIS — I10 Essential (primary) hypertension: Secondary | ICD-10-CM | POA: Insufficient documentation

## 2019-10-11 DIAGNOSIS — I2510 Atherosclerotic heart disease of native coronary artery without angina pectoris: Secondary | ICD-10-CM | POA: Diagnosis not present

## 2019-10-11 DIAGNOSIS — E785 Hyperlipidemia, unspecified: Secondary | ICD-10-CM | POA: Diagnosis not present

## 2019-10-11 DIAGNOSIS — M5126 Other intervertebral disc displacement, lumbar region: Secondary | ICD-10-CM | POA: Diagnosis present

## 2019-10-11 DIAGNOSIS — F329 Major depressive disorder, single episode, unspecified: Secondary | ICD-10-CM | POA: Diagnosis not present

## 2019-10-11 DIAGNOSIS — Z79899 Other long term (current) drug therapy: Secondary | ICD-10-CM | POA: Diagnosis not present

## 2019-10-11 DIAGNOSIS — Z7982 Long term (current) use of aspirin: Secondary | ICD-10-CM | POA: Insufficient documentation

## 2019-10-11 DIAGNOSIS — M009 Pyogenic arthritis, unspecified: Secondary | ICD-10-CM | POA: Diagnosis present

## 2019-10-11 DIAGNOSIS — M25561 Pain in right knee: Secondary | ICD-10-CM | POA: Diagnosis present

## 2019-10-11 DIAGNOSIS — M1711 Unilateral primary osteoarthritis, right knee: Principal | ICD-10-CM | POA: Insufficient documentation

## 2019-10-11 DIAGNOSIS — D649 Anemia, unspecified: Secondary | ICD-10-CM | POA: Diagnosis not present

## 2019-10-11 DIAGNOSIS — Z7901 Long term (current) use of anticoagulants: Secondary | ICD-10-CM | POA: Diagnosis not present

## 2019-10-11 DIAGNOSIS — G8918 Other acute postprocedural pain: Secondary | ICD-10-CM | POA: Diagnosis not present

## 2019-10-11 HISTORY — PX: TOTAL KNEE ARTHROPLASTY: SHX125

## 2019-10-11 SURGERY — ARTHROPLASTY, KNEE, TOTAL
Anesthesia: Monitor Anesthesia Care | Site: Knee | Laterality: Right

## 2019-10-11 MED ORDER — ALBUMIN HUMAN 5 % IV SOLN
INTRAVENOUS | Status: AC
  Filled 2019-10-11: qty 250

## 2019-10-11 MED ORDER — CITALOPRAM HYDROBROMIDE 20 MG PO TABS
10.0000 mg | ORAL_TABLET | Freq: Every day | ORAL | Status: DC
  Administered 2019-10-12: 10 mg via ORAL
  Filled 2019-10-11: qty 1

## 2019-10-11 MED ORDER — ALPRAZOLAM 0.25 MG PO TABS: 0.2500 mg | ORAL_TABLET | Freq: Two times a day (BID) | ORAL | Status: DC | PRN

## 2019-10-11 MED ORDER — POLYETHYLENE GLYCOL 3350 17 G PO PACK
17.0000 g | PACK | Freq: Two times a day (BID) | ORAL | Status: DC
  Administered 2019-10-11 – 2019-10-12 (×2): 17 g via ORAL
  Filled 2019-10-11 (×2): qty 1

## 2019-10-11 MED ORDER — VANCOMYCIN HCL IN DEXTROSE 1-5 GM/200ML-% IV SOLN
1000.0000 mg | INTRAVENOUS | Status: AC
  Administered 2019-10-11: 1000 mg via INTRAVENOUS
  Filled 2019-10-11: qty 200

## 2019-10-11 MED ORDER — ALBUMIN HUMAN 5 % IV SOLN: INTRAVENOUS | Status: DC | PRN

## 2019-10-11 MED ORDER — GABAPENTIN 300 MG PO CAPS
300.0000 mg | ORAL_CAPSULE | Freq: Every day | ORAL | Status: DC
  Administered 2019-10-11: 300 mg via ORAL
  Filled 2019-10-11: qty 1

## 2019-10-11 MED ORDER — ASPIRIN EC 325 MG PO TBEC
325.0000 mg | DELAYED_RELEASE_TABLET | Freq: Every day | ORAL | Status: DC
  Administered 2019-10-12: 325 mg via ORAL
  Filled 2019-10-11: qty 1

## 2019-10-11 MED ORDER — 0.9 % SODIUM CHLORIDE (POUR BTL) OPTIME
TOPICAL | Status: DC | PRN
  Administered 2019-10-11: 1000 mL

## 2019-10-11 MED ORDER — METOPROLOL SUCCINATE ER 25 MG PO TB24
25.0000 mg | ORAL_TABLET | Freq: Every day | ORAL | Status: DC
  Administered 2019-10-12: 25 mg via ORAL
  Filled 2019-10-11: qty 1

## 2019-10-11 MED ORDER — ONDANSETRON HCL 4 MG/2ML IJ SOLN
INTRAMUSCULAR | Status: DC | PRN
  Administered 2019-10-11: 4 mg via INTRAVENOUS

## 2019-10-11 MED ORDER — ACETAMINOPHEN 500 MG PO TABS: 1000.0000 mg | ORAL_TABLET | Freq: Once | ORAL | Status: DC | PRN

## 2019-10-11 MED ORDER — DOCUSATE SODIUM 100 MG PO CAPS
100.0000 mg | ORAL_CAPSULE | Freq: Two times a day (BID) | ORAL | Status: DC
  Administered 2019-10-11 – 2019-10-12 (×2): 100 mg via ORAL
  Filled 2019-10-11 (×2): qty 1

## 2019-10-11 MED ORDER — OXYCODONE HCL 5 MG PO TABS
5.0000 mg | ORAL_TABLET | ORAL | Status: DC | PRN
  Administered 2019-10-11 – 2019-10-12 (×5): 5 mg via ORAL
  Filled 2019-10-11 (×5): qty 1

## 2019-10-11 MED ORDER — CHLORHEXIDINE GLUCONATE 0.12 % MT SOLN
15.0000 mL | Freq: Once | OROMUCOSAL | Status: AC
  Administered 2019-10-11: 15 mL via OROMUCOSAL

## 2019-10-11 MED ORDER — LACTATED RINGERS IV SOLN: INTRAVENOUS | Status: DC

## 2019-10-11 MED ORDER — PROPOFOL 500 MG/50ML IV EMUL
INTRAVENOUS | Status: DC | PRN
  Administered 2019-10-11: 50 ug/kg/min via INTRAVENOUS

## 2019-10-11 MED ORDER — PHENYLEPHRINE HCL (PRESSORS) 10 MG/ML IV SOLN
INTRAVENOUS | Status: AC
  Filled 2019-10-11: qty 1

## 2019-10-11 MED ORDER — CEFAZOLIN SODIUM-DEXTROSE 2-4 GM/100ML-% IV SOLN
2.0000 g | Freq: Four times a day (QID) | INTRAVENOUS | Status: AC
  Administered 2019-10-11 (×2): 2 g via INTRAVENOUS
  Filled 2019-10-11 (×2): qty 100

## 2019-10-11 MED ORDER — ONDANSETRON HCL 4 MG PO TABS: 4.0000 mg | ORAL_TABLET | Freq: Four times a day (QID) | ORAL | Status: DC | PRN

## 2019-10-11 MED ORDER — POVIDONE-IODINE 10 % EX SWAB: 2.0000 "application " | Freq: Once | CUTANEOUS | Status: DC

## 2019-10-11 MED ORDER — FENTANYL CITRATE (PF) 100 MCG/2ML IJ SOLN
INTRAMUSCULAR | Status: AC
Start: 2019-10-11 — End: 2019-10-11
  Administered 2019-10-11: 50 ug via INTRAVENOUS
  Filled 2019-10-11: qty 2

## 2019-10-11 MED ORDER — CEFUROXIME SODIUM 1.5 G IV SOLR
INTRAVENOUS | Status: AC
  Filled 2019-10-11: qty 1.5

## 2019-10-11 MED ORDER — SODIUM CHLORIDE 0.9 % IR SOLN
Status: DC | PRN
  Administered 2019-10-11: 2000 mL

## 2019-10-11 MED ORDER — METOCLOPRAMIDE HCL 5 MG PO TABS: 5.0000 mg | ORAL_TABLET | Freq: Three times a day (TID) | ORAL | Status: DC | PRN

## 2019-10-11 MED ORDER — PROPOFOL 10 MG/ML IV BOLUS
INTRAVENOUS | Status: DC | PRN
  Administered 2019-10-11: 20 mg via INTRAVENOUS
  Administered 2019-10-11: 10 mg via INTRAVENOUS
  Administered 2019-10-11 (×2): 20 mg via INTRAVENOUS

## 2019-10-11 MED ORDER — ALUM & MAG HYDROXIDE-SIMETH 200-200-20 MG/5ML PO SUSP: 30.0000 mL | ORAL | Status: DC | PRN

## 2019-10-11 MED ORDER — ACETAMINOPHEN 160 MG/5ML PO SOLN: 1000.0000 mg | Freq: Once | ORAL | Status: DC | PRN

## 2019-10-11 MED ORDER — VANCOMYCIN HCL 1000 MG IV SOLR
1000.0000 mg | Freq: Two times a day (BID) | INTRAVENOUS | Status: DC
  Administered 2019-10-11: 1000 mg via INTRAVENOUS

## 2019-10-11 MED ORDER — FENTANYL CITRATE (PF) 100 MCG/2ML IJ SOLN
INTRAMUSCULAR | Status: AC
  Filled 2019-10-11: qty 2

## 2019-10-11 MED ORDER — TRANEXAMIC ACID-NACL 1000-0.7 MG/100ML-% IV SOLN
1000.0000 mg | INTRAVENOUS | Status: AC
  Administered 2019-10-11: 1000 mg via INTRAVENOUS
  Filled 2019-10-11: qty 100

## 2019-10-11 MED ORDER — SODIUM CHLORIDE (PF) 0.9 % IJ SOLN
INTRAMUSCULAR | Status: AC
  Filled 2019-10-11: qty 50

## 2019-10-11 MED ORDER — FENTANYL CITRATE (PF) 100 MCG/2ML IJ SOLN
INTRAMUSCULAR | Status: DC | PRN
Start: 2019-10-11 — End: 2019-10-11
  Administered 2019-10-11 (×2): 50 ug via INTRAVENOUS

## 2019-10-11 MED ORDER — MIDAZOLAM HCL 5 MG/5ML IJ SOLN
INTRAMUSCULAR | Status: DC | PRN
  Administered 2019-10-11 (×2): .5 mg via INTRAVENOUS

## 2019-10-11 MED ORDER — SODIUM CHLORIDE (PF) 0.9 % IJ SOLN
INTRAMUSCULAR | Status: DC | PRN
  Administered 2019-10-11: 50 mL via INTRAVENOUS

## 2019-10-11 MED ORDER — OXYCODONE HCL 5 MG PO TABS: 5.0000 mg | ORAL_TABLET | Freq: Once | ORAL | Status: DC | PRN

## 2019-10-11 MED ORDER — DEXAMETHASONE SODIUM PHOSPHATE 10 MG/ML IJ SOLN
10.0000 mg | Freq: Three times a day (TID) | INTRAMUSCULAR | Status: DC
  Administered 2019-10-11 – 2019-10-12 (×3): 10 mg via INTRAVENOUS
  Filled 2019-10-11 (×3): qty 1

## 2019-10-11 MED ORDER — POTASSIUM CHLORIDE IN NACL 20-0.9 MEQ/L-% IV SOLN
INTRAVENOUS | Status: DC
  Administered 2019-10-11: 100 mL/h via INTRAVENOUS
  Filled 2019-10-11 (×4): qty 1000

## 2019-10-11 MED ORDER — METOCLOPRAMIDE HCL 5 MG/ML IJ SOLN: 5.0000 mg | Freq: Three times a day (TID) | INTRAMUSCULAR | Status: DC | PRN

## 2019-10-11 MED ORDER — HYDROMORPHONE HCL 1 MG/ML IJ SOLN
0.5000 mg | INTRAMUSCULAR | Status: DC | PRN
  Administered 2019-10-11: 0.5 mg via INTRAVENOUS
  Filled 2019-10-11: qty 1

## 2019-10-11 MED ORDER — DEXAMETHASONE SODIUM PHOSPHATE 10 MG/ML IJ SOLN
INTRAMUSCULAR | Status: DC | PRN
  Administered 2019-10-11: 8 mg via INTRAVENOUS

## 2019-10-11 MED ORDER — BUPIVACAINE-EPINEPHRINE (PF) 0.25% -1:200000 IJ SOLN
INTRAMUSCULAR | Status: AC
  Filled 2019-10-11: qty 30

## 2019-10-11 MED ORDER — BUPIVACAINE-EPINEPHRINE 0.25% -1:200000 IJ SOLN
INTRAMUSCULAR | Status: DC | PRN
  Administered 2019-10-11: 30 mL

## 2019-10-11 MED ORDER — CEFUROXIME SODIUM 1.5 G IV SOLR
INTRAVENOUS | Status: DC | PRN
  Administered 2019-10-11: 1.5 g via INTRAVENOUS

## 2019-10-11 MED ORDER — MIDAZOLAM HCL 2 MG/2ML IJ SOLN
INTRAMUSCULAR | Status: AC
  Filled 2019-10-11: qty 2

## 2019-10-11 MED ORDER — BUPIVACAINE IN DEXTROSE 0.75-8.25 % IT SOLN
INTRATHECAL | Status: DC | PRN
  Administered 2019-10-11: 1.6 mL via INTRATHECAL

## 2019-10-11 MED ORDER — PHENOL 1.4 % MT LIQD: 1.0000 | OROMUCOSAL | Status: DC | PRN

## 2019-10-11 MED ORDER — ACETAMINOPHEN 10 MG/ML IV SOLN: 1000.0000 mg | Freq: Once | INTRAVENOUS | Status: DC | PRN

## 2019-10-11 MED ORDER — POVIDONE-IODINE 7.5 % EX SOLN: Freq: Once | CUTANEOUS | Status: AC

## 2019-10-11 MED ORDER — ONDANSETRON HCL 4 MG/2ML IJ SOLN: 4.0000 mg | Freq: Four times a day (QID) | INTRAMUSCULAR | Status: DC | PRN

## 2019-10-11 MED ORDER — CEFAZOLIN SODIUM-DEXTROSE 2-4 GM/100ML-% IV SOLN
2.0000 g | INTRAVENOUS | Status: AC
  Administered 2019-10-11: 2 g via INTRAVENOUS
  Filled 2019-10-11: qty 100

## 2019-10-11 MED ORDER — OXYCODONE HCL 5 MG/5ML PO SOLN: 5.0000 mg | Freq: Once | ORAL | Status: DC | PRN

## 2019-10-11 MED ORDER — DIPHENHYDRAMINE HCL 12.5 MG/5ML PO ELIX: 12.5000 mg | ORAL_SOLUTION | ORAL | Status: DC | PRN

## 2019-10-11 MED ORDER — PROPOFOL 10 MG/ML IV BOLUS
INTRAVENOUS | Status: AC
  Filled 2019-10-11: qty 20

## 2019-10-11 MED ORDER — ACETAMINOPHEN 500 MG PO TABS
1000.0000 mg | ORAL_TABLET | Freq: Four times a day (QID) | ORAL | Status: DC
  Administered 2019-10-11 – 2019-10-12 (×3): 1000 mg via ORAL
  Filled 2019-10-11 (×3): qty 2

## 2019-10-11 MED ORDER — ROPIVACAINE HCL 7.5 MG/ML IJ SOLN
INTRAMUSCULAR | Status: DC | PRN
  Administered 2019-10-11: 20 mL via PERINEURAL

## 2019-10-11 MED ORDER — ACETAMINOPHEN 10 MG/ML IV SOLN
INTRAVENOUS | Status: AC
  Administered 2019-10-11: 1000 mg via INTRAVENOUS
  Filled 2019-10-11: qty 100

## 2019-10-11 MED ORDER — PROPOFOL 1000 MG/100ML IV EMUL
INTRAVENOUS | Status: AC
  Filled 2019-10-11: qty 100

## 2019-10-11 MED ORDER — BUPIVACAINE LIPOSOME 1.3 % IJ SUSP
INTRAMUSCULAR | Status: DC | PRN
  Administered 2019-10-11: 20 mL

## 2019-10-11 MED ORDER — MENTHOL 3 MG MT LOZG: 1.0000 | LOZENGE | OROMUCOSAL | Status: DC | PRN

## 2019-10-11 MED ORDER — ORAL CARE MOUTH RINSE: 15.0000 mL | Freq: Once | OROMUCOSAL | Status: AC

## 2019-10-11 MED ORDER — EPHEDRINE 5 MG/ML INJ
INTRAVENOUS | Status: AC
  Filled 2019-10-11: qty 10

## 2019-10-11 MED ORDER — FENTANYL CITRATE (PF) 100 MCG/2ML IJ SOLN
25.0000 ug | INTRAMUSCULAR | Status: DC | PRN
  Administered 2019-10-11 (×2): 25 ug via INTRAVENOUS

## 2019-10-11 MED ORDER — DEXAMETHASONE SODIUM PHOSPHATE 10 MG/ML IJ SOLN: 8.0000 mg | Freq: Once | INTRAMUSCULAR | Status: DC

## 2019-10-11 MED ORDER — PHENYLEPHRINE HCL-NACL 10-0.9 MG/250ML-% IV SOLN
INTRAVENOUS | Status: DC | PRN
  Administered 2019-10-11: 25 ug/min via INTRAVENOUS

## 2019-10-11 MED ORDER — WATER FOR IRRIGATION, STERILE IR SOLN
Status: DC | PRN
  Administered 2019-10-11: 2000 mL

## 2019-10-11 SURGICAL SUPPLY — 64 items
APL PRP STRL LF DISP 70% ISPRP (MISCELLANEOUS) ×2
BAG SPEC THK2 15X12 ZIP CLS (MISCELLANEOUS) ×1
BAG ZIPLOCK 12X15 (MISCELLANEOUS) ×3 IMPLANT
BASEPLATE TIBIAL RT SZ2 (Knees) ×2 IMPLANT
BLADE HEX COATED 2.75 (ELECTRODE) ×3 IMPLANT
BLADE SAGITTAL 25.0X1.19X90 (BLADE) ×2 IMPLANT
BLADE SAGITTAL 25.0X1.19X90MM (BLADE) ×1
BLADE SAW SGTL 13X75X1.27 (BLADE) ×3 IMPLANT
BLADE SURG SZ10 CARB STEEL (BLADE) ×6 IMPLANT
BOWL SMART MIX CTS (DISPOSABLE) ×3 IMPLANT
BSPLAT TIB 2 CMNT M TPR KN RT (Knees) ×1 IMPLANT
CEMENT HV SMART SET (Cement) ×6 IMPLANT
CHLORAPREP W/TINT 26 (MISCELLANEOUS) ×6 IMPLANT
CLOSURE STERI-STRIP 1/2X4 (GAUZE/BANDAGES/DRESSINGS) ×1
CLOSURE WOUND 1/2 X4 (GAUZE/BANDAGES/DRESSINGS) ×1
CLSR STERI-STRIP ANTIMIC 1/2X4 (GAUZE/BANDAGES/DRESSINGS) ×1 IMPLANT
COVER SURGICAL LIGHT HANDLE (MISCELLANEOUS) ×3 IMPLANT
COVER WAND RF STERILE (DRAPES) IMPLANT
CUFF TOURN SGL QUICK 34 (TOURNIQUET CUFF) ×3
CUFF TRNQT CYL 34X4.125X (TOURNIQUET CUFF) ×1 IMPLANT
DECANTER SPIKE VIAL GLASS SM (MISCELLANEOUS) ×6 IMPLANT
DRAPE SHEET LG 3/4 BI-LAMINATE (DRAPES) ×3 IMPLANT
DRAPE U-SHAPE 47X51 STRL (DRAPES) ×3 IMPLANT
DRSG AQUACEL AG ADV 3.5X10 (GAUZE/BANDAGES/DRESSINGS) ×3 IMPLANT
DRSG PAD ABDOMINAL 8X10 ST (GAUZE/BANDAGES/DRESSINGS) ×3 IMPLANT
ELECT REM PT RETURN 15FT ADLT (MISCELLANEOUS) ×3 IMPLANT
FEMORAL GENESIS PRIM LUGS (Orthopedic Implant) ×4 IMPLANT
GLOVE BIO SURGEON STRL SZ7 (GLOVE) ×3 IMPLANT
GLOVE BIOGEL PI IND STRL 7.0 (GLOVE) ×1 IMPLANT
GLOVE BIOGEL PI IND STRL 7.5 (GLOVE) ×1 IMPLANT
GLOVE BIOGEL PI INDICATOR 7.0 (GLOVE) ×2
GLOVE BIOGEL PI INDICATOR 7.5 (GLOVE) ×2
GLOVE SS BIOGEL STRL SZ 7.5 (GLOVE) ×1 IMPLANT
GLOVE SUPERSENSE BIOGEL SZ 7.5 (GLOVE) ×2
GOWN STRL REUS W/ TWL LRG LVL3 (GOWN DISPOSABLE) ×2 IMPLANT
GOWN STRL REUS W/TWL LRG LVL3 (GOWN DISPOSABLE) ×6
HANDPIECE INTERPULSE COAX TIP (DISPOSABLE) ×3
HOLDER FOLEY CATH W/STRAP (MISCELLANEOUS) ×2 IMPLANT
HOOD PEEL AWAY FLYTE STAYCOOL (MISCELLANEOUS) ×9 IMPLANT
INSERT SPEED PIN RIMMED 45MM (PIN) ×6 IMPLANT
INSERT SZ 1-2 KNEE (Knees) ×2 IMPLANT
KIT TURNOVER KIT A (KITS) ×3 IMPLANT
KNEE FEMORAL COMP POST SZ3 RT (Knees) ×3 IMPLANT
MANIFOLD NEPTUNE II (INSTRUMENTS) ×3 IMPLANT
MARKER SKIN DUAL TIP RULER LAB (MISCELLANEOUS) ×3 IMPLANT
NDL SAFETY ECLIPSE 18X1.5 (NEEDLE) ×1 IMPLANT
NEEDLE HYPO 18GX1.5 SHARP (NEEDLE) ×3
NS IRRIG 1000ML POUR BTL (IV SOLUTION) ×3 IMPLANT
PACK TOTAL KNEE CUSTOM (KITS) ×3 IMPLANT
PATELLA 29MM (Knees) ×3 IMPLANT
PENCIL SMOKE EVACUATOR (MISCELLANEOUS) ×3 IMPLANT
PIN TROCAR 3 INCH (PIN) ×24 IMPLANT
PROTECTOR NERVE ULNAR (MISCELLANEOUS) ×3 IMPLANT
SET HNDPC FAN SPRY TIP SCT (DISPOSABLE) ×1 IMPLANT
STAPLER VISISTAT 35W (STAPLE) IMPLANT
STRIP CLOSURE SKIN 1/2X4 (GAUZE/BANDAGES/DRESSINGS) ×2 IMPLANT
SUT MNCRL AB 3-0 PS2 18 (SUTURE) ×3 IMPLANT
SUT VIC AB 0 CT1 36 (SUTURE) ×5 IMPLANT
SUT VIC AB 1 CT1 36 (SUTURE) ×3 IMPLANT
SUT VIC AB 2-0 CT1 27 (SUTURE) ×6
SUT VIC AB 2-0 CT1 TAPERPNT 27 (SUTURE) ×2 IMPLANT
SYR CONTROL 10ML LL (SYRINGE) ×9 IMPLANT
TRAY FOLEY MTR SLVR 14FR STAT (SET/KITS/TRAYS/PACK) ×3 IMPLANT
WATER STERILE IRR 1000ML POUR (IV SOLUTION) ×6 IMPLANT

## 2019-10-11 NOTE — Anesthesia Procedure Notes (Signed)
Anesthesia Regional Block: Adductor canal block   Pre-Anesthetic Checklist: ,, timeout performed, Correct Patient, Correct Site, Correct Laterality, Correct Procedure, Correct Position, site marked, Risks and benefits discussed,  Surgical consent,  Pre-op evaluation,  At surgeon's request and post-op pain management  Laterality: Right and Lower  Prep: chloraprep       Needles:  Injection technique: Single-shot     Needle Length: 9cm  Needle Gauge: 22     Additional Needles: Arrow StimuQuik ECHO Echogenic Stimulating PNB Needle  Procedures:,,,, ultrasound used (permanent image in chart),,,,  Narrative:  Start time: 10/11/2019 7:19 AM End time: 10/11/2019 7:22 AM Injection made incrementally with aspirations every 5 mL.  Performed by: Personally  Anesthesiologist: Oleta Mouse, MD

## 2019-10-11 NOTE — Transfer of Care (Signed)
Immediate Anesthesia Transfer of Care Note  Patient: [name]  Procedure(s) Performed: TOTAL KNEE ARTHROPLASTY (Right Knee)  Patient Location: PACU  Anesthesia Type:Spinal  Level of Consciousness: awake, alert , oriented and patient cooperative  Airway & Oxygen Therapy: Patient Spontanous Breathing and Patient connected to face mask oxygen  Post-op Assessment: Report given to RN and Post -op Vital signs reviewed and stable  Post vital signs: stable  Last Vitals:  Vitals Value Taken Time  BP 131/50 10/11/19 0950  Temp    Pulse 69 10/11/19 0959  Resp 15 10/11/19 0959  SpO2 92 % 10/11/19 0959  Vitals shown include unvalidated device data.  Last Pain:  Vitals:   10/11/19 0545  TempSrc: Oral         Complications: No complications documented.

## 2019-10-11 NOTE — TOC Transition Note (Signed)
Transition of Care [facility]) - CM/SW Discharge Note   Patient Details  Name: Mackenzie [name] MRN: [id number] Date of Birth: 11/30/1940  Transition of [facility] CM/SW Contact:  Lennart Pall, LCSW Phone Number: 10/11/2019, 3:29 PM   Clinical Narrative:     Met briefly with pt who is Ortho Bundle.  Confirming plans for OPPT at [place] and they have received the CPM. No TOC needs.        Patient Goals and CMS Choice        Discharge Placement                       Discharge Plan and Services                DME Arranged: CPM DME Agency: [facility]                  Social Determinants of Health (SDOH) Interventions     Readmission Risk Interventions No flowsheet data found.

## 2019-10-11 NOTE — Anesthesia Procedure Notes (Signed)
Spinal  Patient location during procedure: OR Start time: 10/11/2019 7:34 AM End time: 10/11/2019 7:35 AM Staffing Performed: anesthesiologist  Anesthesiologist: Oleta Mouse, MD Preanesthetic Checklist Completed: patient identified, IV checked, risks and benefits discussed, surgical consent, monitors and equipment checked, pre-op evaluation and timeout performed Spinal Block Patient position: sitting Prep: DuraPrep Patient monitoring: heart rate, cardiac monitor, continuous pulse ox and blood pressure Approach: midline Location: L3-4 Injection technique: single-shot Needle Needle type: Pencan  Needle gauge: 24 G Needle length: 9 cm Assessment Sensory level: T6

## 2019-10-11 NOTE — Evaluation (Signed)
Physical Therapy Evaluation Patient Details Name: Mackenzie [name] MRN: [id number] DOB: Sep 21, 1940 Today's Date: 10/11/2019   History of Present Illness  Patient is 79 y.o. 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     Clinical Impression  Mackenzie [name] is a 79 y.o. female POD 0 s/p Rt TKA. Patient reports independence with mobility at baseline. Patient is now limited by functional impairments (see PT problem list below) and requires min assist for transfers and gait with RW. Patient was able to ambulate ~30 feet with RW and min assist and cues for safety. Patient instructed in exercise to facilitate ROM and circulation. Patient will benefit from continued skilled PT interventions to address impairments and progress towards PLOF. Acute PT will follow to progress mobility and stair training in preparation for safe discharge home.     Follow Up Recommendations Follow surgeon's recommendation for DC plan and follow-up therapies;Home health PT    Equipment Recommendations  None recommended by PT    Recommendations for Other Services       Precautions / Restrictions Precautions Precautions: Fall Restrictions Weight Bearing Restrictions: No Other Position/Activity Restrictions: WBAT      Mobility  Bed Mobility Overal bed mobility: Needs Assistance Bed Mobility: Supine to Sit     Supine to sit: Min assist;HOB elevated     General bed mobility comments: cues to use bed rail, light assist to raise trunk upright  Transfers Overall transfer level: Needs assistance Equipment used: Rolling walker (2 wheeled) Transfers: Sit to/from Stand Sit to Stand: Min assist         General transfer comment: pt required repeated cues for safe technique/hand placement on RW. 3x sit<>stand performed EOB. min assist to steady with rise.  Ambulation/Gait Ambulation/Gait assistance: Min assist Gait Distance (Feet):  30 Feet Assistive device: Rolling walker (2 wheeled) Gait Pattern/deviations: Step-to pattern;Decreased stride length;Decreased weight shift to right Gait velocity: decr   General Gait Details: cues for step pattern and proximity to RW, no overt LOB noted with gait, no buckling at Rt knee. assist for walker positioing throughout.  Stairs     Wheelchair Mobility    Modified Rankin (Stroke Patients Only)       Balance Overall balance assessment: Needs assistance Sitting-balance support: Feet supported Sitting balance-Leahy Scale: Good     Standing balance support: During functional activity;Bilateral upper extremity supported Standing balance-Leahy Scale: Fair                  Pertinent Vitals/Pain Pain Assessment: 0-10 Pain Score: 2  Pain Location: Rt knee Pain Descriptors / Indicators: Aching;Discomfort;Burning Pain Intervention(s): Limited activity within patient's tolerance;Monitored during session;Repositioned;Ice applied    Home Living Family/patient expects to be discharged to:: Private residence Living Arrangements: Alone Available Help at Discharge: Family Type of Home: House Home Access: Stairs to enter Entrance Stairs-Rails: Left Entrance Stairs-Number of Steps: 3 (at [place]: One [place] - single point;Bedside commode      Prior Function Level of Independence: Independent         Comments: pt admits to furniture surfing but was not using any device     Hand Dominance   Dominant Hand: Right    Extremity/Trunk Assessment   Upper Extremity Assessment Upper Extremity Assessment: Overall WFL for tasks assessed    Lower Extremity Assessment Lower Extremity Assessment: RLE deficits/detail RLE Deficits / Details: good quad activation, no extensor  lag with SLR RLE Sensation: WNL RLE Coordination: WNL    Cervical / Trunk Assessment Cervical / Trunk Assessment: Normal  Communication   Communication: No  difficulties  Cognition Arousal/Alertness: Awake/alert Behavior During Therapy: WFL for tasks assessed/performed Overall Cognitive Status: Within Functional Limits for tasks assessed           General Comments      Exercises Total Joint Exercises Ankle Circles/Pumps: AROM;Both;20 reps;Seated Quad Sets: AROM;Right;5 reps;Seated Heel Slides: AROM;Right;5 reps;Seated   Assessment/Plan    PT Assessment Patient needs continued PT services  PT Problem List Decreased strength;Decreased range of motion;Decreased activity tolerance;Decreased balance;Decreased mobility;Decreased knowledge of use of DME;Decreased knowledge of precautions       PT Treatment Interventions DME instruction;Gait training;Stair training;Functional mobility training;Therapeutic activities;Therapeutic exercise;Balance training;Patient/family education    PT Goals (Current goals can be found in the Care Plan section)  Acute Rehab PT Goals Patient Stated Goal: get back to being independent PT Goal Formulation: With patient Time For Goal Achievement: 10/18/19 Potential to Achieve Goals: Good    Frequency 7X/week   Barriers to discharge       AM-PAC PT "6 Clicks" Mobility  Outcome Measure Help needed turning from your back to your side while in a flat bed without using bedrails?: A Little Help needed moving from lying on your back to sitting on the side of a flat bed without using bedrails?: A Little Help needed moving to and from a bed to a chair (including a wheelchair)?: A Little Help needed standing up from a chair using your arms (e.g., wheelchair or bedside chair)?: A Little Help needed to walk in hospital room?: A Little Help needed climbing 3-5 steps with a railing? : A Little 6 Click Score: 18    End of Session Equipment Utilized During Treatment: Gait belt Activity Tolerance: Patient tolerated treatment well Patient left: in chair;with call bell/phone within reach;with chair alarm set;with  family/visitor present Nurse Communication: Mobility status PT Visit Diagnosis: Muscle weakness (generalized) (M62.81);Difficulty in walking, not elsewhere classified (R26.2)    Time: 1829-9371 PT Time Calculation (min) (ACUTE ONLY): 38 min   Charges:   PT Evaluation $PT Eval Low Complexity: 1 Low PT Treatments $Gait Training: 8-22 mins $Therapeutic Exercise: 8-22 mins      Verner Mould, DPT Acute Rehabilitation Services  Office [phone] Pager [phone]  10/11/2019 3:16 PM

## 2019-10-11 NOTE — Op Note (Signed)
MRN:     [id number] DOB/AGE:    [birth year] / 79 y.o.       OPERATIVE REPORT   DATE OF PROCEDURE:  10/11/2019      PREOPERATIVE DIAGNOSIS:   Primary Localized Osteoarthritis right Knee       Estimated body mass index is 24.69 kg/m as calculated from the following:   Height as of this encounter: 5' (1.524 m).   Weight as of this encounter: 57.3 kg.                                                       POSTOPERATIVE DIAGNOSIS:   Same                                                                 PROCEDURE:  Procedure(s): TOTAL KNEE ARTHROPLASTY Using Smith Nephew Oxinium implants #3 Femur, #2Tibia, 4mm  RP bearing, 29 Patella    SURGEON: Mozes Sagar A. Noemi Chapel, MD   ASSISTANT: Matthew Saras, PA-C, present and scrubbed throughout the case, critical for retraction, instrumentation, and closure.  ANESTHESIA: Spinal with Adductor Nerve Block  TOURNIQUET TIME: 65 minutes   COMPLICATIONS:  None       SPECIMENS: None   INDICATIONS FOR PROCEDURE: The patient has djd of the knee with varus deformities, XR shows bone on bone arthritis. Patient has failed all conservative measures including anti-inflammatory medicines, narcotics, attempts at exercise and weight loss, cortisone injections and viscosupplementation.  Risks and benefits of surgery have been discussed, questions answered.    DESCRIPTION OF PROCEDURE: The patient identified by armband, received right adductor canal block and IV antibiotics, in the holding area at [facility]. Patient taken to the operating room, appropriate anesthetic monitors were attached. Spinal anesthesia induced with the patient in supine position, Foley catheter was inserted. Tourniquet applied high to the operative thigh. Lateral post and foot positioner applied to the table, the lower extremity was then prepped and draped in usual sterile fashion from the ankle to the tourniquet. Time-out procedure was performed. The limb was wrapped with an Esmarch bandage and  the tourniquet inflated to 365 mmHg.   We began the operation by making a 6cm anterior midline incision. Small bleeders in the skin and the subcutaneous tissue identified and cauterized. Transverse retinaculum was incised and reflected medially and a medial parapatellar arthrotomy was accomplished. the patella was everted and theprepatellar fat pad resected. The superficial medial collateral ligament was then elevated from anterior to posterior along the proximal flare of the tibia and anterior half of the menisci resected. The knee was hyperflexed exposing bone on bone arthritis. Peripheral and notch osteophytes as well as the cruciate ligaments were then resected. We continued to work our way around posteriorly along the proximal tibia, and externally rotated the tibia subluxing it out from underneath the femur. A McHale retractor was placed through the notch and a lateral Hohmann retractor placed, and an external tibial guide was placed.  The tibial cutting guide was pinned into place allowing resection of 4 mm of bone medially and about 6 mm of bone laterally because of her varus deformity.  Satisfied with the tibial resection, we then entered the distal femur 2 mm anterior to the PCL origin with the intramedullary guide rod and applied the distal femoral cutting guide set at 63mm, with 5 degrees of valgus. This was pinned along the epicondylar axis. At this point, the distal femoral cut was accomplished without difficulty. We then sized for a 3 femoral component and pinned the guide in 3 degrees of external rotation.The chamfer cutting guide was pinned into place. The anterior, posterior, and chamfer cuts were accomplished without difficulty followed by the  box cutting guide and the box cut. We also removed posterior osteophytes from the posterior femoral condyles. At this time, the knee was brought into full extension. We checked our extension and flexion gaps and found them symmetric at 9.  The  patella thickness measured at 78m m. We set the cutting guide at 13 and removed the posterior patella sized for 29 button and drilled the lollipop. The knee was then once again hyperflexed exposing the proximal tibia. We sized for a # 2 tibial base plate, applied the smokestack and the conical reamer followed by the the Delta fin keel punch. We then hammered into place the  poly trial femoral component, inserted a trial bearing, trial patellar button, and took the knee through range of motion from 0-130 degrees. No thumb pressure was required for patellar tracking.   At this point, all trial components were removed, a double batch of DePuy HV cement with Zinacef 1.5gms  was mixed and applied to all bony metallic mating surfaces. In order, we hammered into place the tibial tray and removed excess cement, the femoral component and removed excess cement, a 9 mm  bearing was inserted, and the knee brought to full extension with compression. The patellar button was clamped into place, and excess cement removed. While the cement cured the wound was irrigated out with normal saline solution pulse lavage, and exparel was injected throughout the knee. Ligament stability and patellar tracking were checked and found to be excellent..   The parapatellar arthrotomy was closed with  #1 Vicryl suture. The subcutaneous tissue with 0 and 2-0 undyed Vicryl suture, and 4-0 Monocryl.. A dressing of Aquaseal, 4 x 4, dressing sponges, Webril, and Ace wrap applied. Needle and sponge count were correct times 2.The patient awakened, extubated, and taken to recovery room without difficulty. Vascular status was normal, pulses 2+ and symmetric.    Lorn Junes 05/08/2017, 8:56 AM

## 2019-10-11 NOTE — Anesthesia Preprocedure Evaluation (Addendum)
Anesthesia Evaluation  Patient identified by MRN, date of birth, ID band Patient awake    Reviewed: Allergy & Precautions, NPO status , Patient's Chart, lab work & pertinent test results, reviewed documented beta blocker date and time   History of Anesthesia Complications Negative for: history of anesthetic complications  Airway Mallampati: II  TM Distance: >3 FB Neck ROM: Full    Dental  (+) Dental Advisory Given, Teeth Intact   Pulmonary neg shortness of breath, neg COPD, neg recent URI, former smoker,  Covid-19 Nucleic Acid Test Results Lab Results      Component                Value               Date                      [place]              NEGATIVE            10/08/2019                [place]              NOT DETECTED        03/04/2019                [place]              Not Detected        01/25/2019                [place]              Not Detected        08/07/2018              breath sounds clear to auscultation       Cardiovascular hypertension, Pt. on medications and Pt. on home beta blockers (-) angina+ CAD  (-) Past MI and (-) CHF (-) dysrhythmias  Rhythm:Regular  2017:  1. Minor nonobstructive CAD as detailed below with calcified coronary arteries but no obstruction 2. Normal LV function    Neuro/Psych PSYCHIATRIC DISORDERS Anxiety Depression negative neurological ROS     GI/Hepatic negative GI ROS, Neg liver ROS,   Endo/Other  negative endocrine ROS  Renal/GU Renal disease     Musculoskeletal  (+) Arthritis ,   Abdominal   Peds  Hematology Lab Results      Component                Value               Date                      WBC                      8.2                 10/09/2019                HGB                      12.6                10/09/2019                HCT  39.1                10/09/2019                MCV                      96.8                 10/09/2019                PLT                      366                 10/09/2019           Lab Results      Component                Value               Date                      INR                      0.9                 10/09/2019                INR                      1.00                10/20/2015                INR                      1.02                12/04/2014           ptt 27   Anesthesia Other Findings   Reproductive/Obstetrics                            Anesthesia Physical Anesthesia Plan  ASA: II  Anesthesia Plan: MAC, Regional and Spinal   Post-op Pain Management:  Regional for Post-op pain   Induction: Intravenous  PONV Risk Score and Plan: 2 and Treatment may vary due to age or medical condition and Propofol infusion  Airway Management Planned: Nasal Cannula  Additional Equipment: None  Intra-op Plan:   Post-operative Plan:   Informed Consent: I have reviewed the patients History and Physical, chart, labs and discussed the procedure including the risks, benefits and alternatives for the proposed anesthesia with the patient or authorized representative who has indicated his/her understanding and acceptance.     Dental advisory given  Plan Discussed with: CRNA and Surgeon  Anesthesia Plan Comments:         Anesthesia Quick Evaluation

## 2019-10-11 NOTE — Interval H&P Note (Signed)
History and Physical Interval Note:  10/11/2019 6:33 AM  Mackenzie [name]  has presented today for surgery, with the diagnosis of djd right knee.  The various methods of treatment have been discussed with the patient and family. After consideration of risks, benefits and other options for treatment, the patient has consented to  Procedure(s): TOTAL KNEE ARTHROPLASTY (Right) as a surgical intervention.  The patient's history has been reviewed, patient examined, no change in status, stable for surgery.  I have reviewed the patient's chart and labs.  Questions were answered to the patient's satisfaction.     Lorn Junes

## 2019-10-12 DIAGNOSIS — D649 Anemia, unspecified: Secondary | ICD-10-CM | POA: Diagnosis not present

## 2019-10-12 DIAGNOSIS — F329 Major depressive disorder, single episode, unspecified: Secondary | ICD-10-CM | POA: Diagnosis not present

## 2019-10-12 DIAGNOSIS — I10 Essential (primary) hypertension: Secondary | ICD-10-CM | POA: Diagnosis not present

## 2019-10-12 DIAGNOSIS — M1711 Unilateral primary osteoarthritis, right knee: Secondary | ICD-10-CM | POA: Diagnosis not present

## 2019-10-12 DIAGNOSIS — I2510 Atherosclerotic heart disease of native coronary artery without angina pectoris: Secondary | ICD-10-CM | POA: Diagnosis not present

## 2019-10-12 DIAGNOSIS — E785 Hyperlipidemia, unspecified: Secondary | ICD-10-CM | POA: Diagnosis not present

## 2019-10-12 LAB — BASIC METABOLIC PANEL
Anion gap: 9 (ref 5–15)
BUN: 26 mg/dL — ABNORMAL HIGH (ref 8–23)
CO2: 24 mmol/L (ref 22–32)
Calcium: 8.5 mg/dL — ABNORMAL LOW (ref 8.9–10.3)
Chloride: 105 mmol/L (ref 98–111)
Creatinine, Ser: 0.97 mg/dL (ref 0.44–1.00)
GFR calc Af Amer: >60 mL/min (ref >60)
GFR calc non Af Amer: 55 mL/min — ABNORMAL LOW (ref >60)
Glucose, Bld: 170 mg/dL — ABNORMAL HIGH (ref 70–99)
Potassium: 4.4 mmol/L (ref 3.5–5.1)
Sodium: 138 mmol/L (ref 135–145)

## 2019-10-12 LAB — CBC
HCT: 29.1 % — ABNORMAL LOW (ref 36.0–46.0)
Hemoglobin: 9.3 g/dL — ABNORMAL LOW (ref 12.0–15.0)
MCH: 31.3 pg (ref 26.0–34.0)
MCHC: 32 g/dL (ref 30.0–36.0)
MCV: 98 fL (ref 80.0–100.0)
Platelets: 280 10*3/uL (ref 150–400)
RBC: 2.97 MIL/uL — ABNORMAL LOW (ref 3.87–5.11)
RDW: 12.5 % (ref 11.5–15.5)
WBC: 14.1 10*3/uL — ABNORMAL HIGH (ref 4.0–10.5)
nRBC: 0 % (ref 0.0–0.2)

## 2019-10-12 MED ORDER — OXYCODONE HCL 5 MG PO TABS
5.0000 mg | ORAL_TABLET | ORAL | 0 refills | Status: DC | PRN
Start: 2019-10-12 — End: 2022-02-10

## 2019-10-12 MED ORDER — POLYETHYLENE GLYCOL 3350 17 G PO PACK: PACK | ORAL | 0 refills | Status: DC

## 2019-10-12 MED ORDER — APIXABAN 2.5 MG PO TABS: 2.5000 mg | ORAL_TABLET | Freq: Every day | ORAL | 0 refills | Status: DC

## 2019-10-12 MED ORDER — GABAPENTIN 300 MG PO CAPS: 300.0000 mg | ORAL_CAPSULE | Freq: Every day | ORAL | 0 refills | Status: DC

## 2019-10-12 MED ORDER — DOCUSATE SODIUM 100 MG PO CAPS: ORAL_CAPSULE | ORAL | 0 refills | Status: DC

## 2019-10-12 NOTE — Progress Notes (Signed)
Physical Therapy Treatment Patient Details Name: Mackenzie [name] MRN: [id number] DOB: 06-12-1940 Today's Date: 10/12/2019    History of Present Illness Patient is 79 y.o. 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     PT Comments    Progressing well with mobility. Reviewed/practiced exercises, gait training, and stair training. Issued HEP for pt to perfom 1-2x/day as tolerated. All education completed. Okay to d/c from PT standpoint.    Follow Up Recommendations  Follow surgeon's recommendation for DC plan and follow-up therapies     Equipment Recommendations  None recommended by PT    Recommendations for Other Services       Precautions / Restrictions Precautions Precautions: Fall Restrictions Weight Bearing Restrictions: No Other Position/Activity Restrictions: WBAT    Mobility  Bed Mobility               General bed mobility comments: oob in recliner  Transfers Overall transfer level: Needs assistance Equipment used: Rolling walker (2 wheeled) Transfers: Sit to/from Stand Sit to Stand: Supervision            Ambulation/Gait Ambulation/Gait assistance: Supervision Gait Distance (Feet): 75 Feet Assistive device: Rolling walker (2 wheeled) Gait Pattern/deviations: Step-through pattern;Decreased stride length         Stairs Stairs: Yes Stairs assistance: Min guard Stair Management: One rail Left;Step to pattern;Forwards Number of Stairs: 2 General stair comments: VCs safety, sequence. Husband present to observe.   Wheelchair Mobility    Modified Rankin (Stroke Patients Only)       Balance Overall balance assessment: Mild deficits observed, not formally tested                                          Cognition Arousal/Alertness: Awake/alert Behavior During Therapy: WFL for tasks assessed/performed Overall Cognitive Status: Within  Functional Limits for tasks assessed                                        Exercises Total Joint Exercises Ankle Circles/Pumps: AROM;Both;10 reps Quad Sets: AROM;Both;10 reps Hip ABduction/ADduction: AAROM;Right;10 reps;AROM Straight Leg Raises: AROM;AAROM;Right;10 reps Knee Flexion: AROM;Right;10 reps;Seated Goniometric ROM: ~10-80 degrees    General Comments        Pertinent Vitals/Pain Pain Assessment: 0-10 Pain Score: 7  Pain Location: Rt knee Pain Descriptors / Indicators: Aching;Discomfort;Sore Pain Intervention(s): Limited activity within patient's tolerance;Monitored during session;Repositioned    Home Living                      Prior Function            PT Goals (current goals can now be found in the care plan section) Progress towards PT goals: Progressing toward goals    Frequency    7X/week      PT Plan Current plan remains appropriate    Co-evaluation              AM-PAC PT "6 Clicks" Mobility   Outcome Measure  Help needed turning from your back to your side while in a flat bed without using bedrails?: A Little Help needed moving from lying on your back to sitting on the side of a flat bed without using bedrails?: A  Little Help needed moving to and from a bed to a chair (including a wheelchair)?: A Little Help needed standing up from a chair using your arms (e.g., wheelchair or bedside chair)?: A Little Help needed to walk in hospital room?: A Little Help needed climbing 3-5 steps with a railing? : A Little 6 Click Score: 18    End of Session Equipment Utilized During Treatment: Gait belt Activity Tolerance: Patient tolerated treatment well Patient left: in chair;with call bell/phone within reach;with family/visitor present   PT Visit Diagnosis: Other abnormalities of gait and mobility (R26.89)     Time: 9937-1696 PT Time Calculation (min) (ACUTE ONLY): 27 min  Charges:  $Gait Training: 8-22  mins $Therapeutic Exercise: 8-22 mins                         Doreatha Massed, PT Acute Rehabilitation  Office: [phone] Pager: [phone]

## 2019-10-12 NOTE — Progress Notes (Signed)
Orthopedic Tech Progress Note Patient Details:  Mackenzie [name] 12-06-1940 [id number] Patient has been discharged picked up cpm. Patient ID: [name], female   DOB: 11-Aug-1940, 79 y.o.   MRN: [id number]   Braulio Bosch 10/12/2019, 2:07 PM

## 2019-10-12 NOTE — Discharge Summary (Signed)
Patient ID: Mackenzie [name] MRN: [id number] DOB/AGE: [birth year] 79 y.o.  Admit date: 10/11/2019 Discharge date: 10/12/2019  Admission Diagnoses:  Principal Problem:   Primary localized osteoarthritis of right knee Active Problems:   Anemia, normocytic normochromic   Arteriosclerotic cardiovascular disease (ASCVD)   Hypertension   Essential hypertension   Septic joint of right knee joint (HCC)   Lumbar disc herniation   Discharge Diagnoses:  Same  Past Medical History:  Diagnosis Date   Anemia, normocytic normochromic 01/2012   hx of   Anxiety    takes Xanax daily as needed   Arthritis    knees, hands   Cataract    right eye   Depression    takes Citalopram daily   Dizziness    cardiologist is aware and told pt it was related to meds   History of bronchitis    2014   History of colon polyps    benign   History of migraine    Hyperlipidemia    takes Pravastatin daily   Hypertension    takes Metoprolol and Azor daily   Joint pain    Pneumonia    hx of-2015   Primary localized osteoarthritis of right knee 10/01/2019   Pseudogout of knee, right 02/22/2017   Renal insufficiency 02/22/2017   Rheumatic fever    hx of   Wheezing on expiration     Surgeries: Procedure(s): TOTAL KNEE ARTHROPLASTY on 10/11/2019   Consultants:   Discharged Condition: Improved  Hospital Course: MARCHE [name] is an 79 y.o. female who was admitted 10/11/2019 for operative treatment ofPrimary localized osteoarthritis of right knee. Patient has severe unremitting pain that affects sleep, daily activities, and work/hobbies. After pre-op clearance the patient was taken to the operating room on 10/11/2019 and underwent  Procedure(s): TOTAL KNEE ARTHROPLASTY.    Patient was given perioperative antibiotics:  Anti-infectives (From admission, onward)   Start     Dose/Rate Route Frequency Ordered Stop   10/11/19 1430  ceFAZolin (ANCEF) IVPB 2g/100 mL premix        2 g 200 mL/hr over 30  Minutes Intravenous Every 6 hours 10/11/19 1201 10/11/19 2111   10/11/19 1000  vancomycin (VANCOCIN) 1,000 mg in sodium chloride 0.9 % 250 mL IVPB  Status:  Discontinued        1,000 mg 250 mL/hr over 60 Minutes Intravenous Every 12 hours 10/11/19 0833 10/11/19 0836   10/11/19 0825  cefUROXime (ZINACEF) injection  Status:  Discontinued          As needed 10/11/19 0825 10/11/19 1156   10/11/19 0600  ceFAZolin (ANCEF) IVPB 2g/100 mL premix        2 g 200 mL/hr over 30 Minutes Intravenous On call to O.R. 10/11/19 0530 10/11/19 0732   10/11/19 0600  vancomycin (VANCOCIN) IVPB 1000 mg/200 mL premix        1,000 mg 200 mL/hr over 60 Minutes Intravenous On call to O.R. 10/11/19 0530 10/11/19 0730       Patient was given sequential compression devices, early ambulation, and chemoprophylaxis to prevent DVT.  Patient benefited maximally from hospital stay and there were no complications.    Recent vital signs:  Patient Vitals for the past 24 hrs:  BP Temp Temp src Pulse Resp SpO2 Height Weight  10/12/19 0740 (!) 145/56 -- -- 68 -- -- -- --  10/12/19 0539 (!) 125/48 98.6 F (37 C) -- 67 16 93 % -- --  10/12/19 0146 (!) 132/56 98.2 F (36.8 C)  Oral 62 17 98 % -- --  10/11/19 2157 (!) 134/53 98.4 F (36.9 C) Oral 65 16 96 % -- --  10/11/19 1413 (!) 115/49 -- -- 67 18 (!) 87 % -- --  10/11/19 1315 (!) 119/45 -- -- 68 16 95 % -- --  10/11/19 1140 117/62 97.8 F (36.6 C) Oral 65 15 -- 5' (1.524 m) 57.2 kg  10/11/19 1130 (!) 119/48 98 F (36.7 C) -- 69 16 100 % -- --  10/11/19 1115 99/87 -- -- 71 17 95 % -- --  10/11/19 1100 (!) 108/38 -- -- 64 20 99 % -- --  10/11/19 1045 97/72 -- -- 63 13 100 % -- --  10/11/19 1030 (!) 130/53 -- -- 71 14 97 % -- --  10/11/19 1015 (!) 133/49 -- -- 73 18 99 % -- --  10/11/19 1000 (!) 132/51 (!) 97.5 F (36.4 C) -- 69 15 99 % -- --     Recent laboratory studies:  Recent Labs    10/09/19 0938 10/09/19 0938 10/12/19 0356  WBC 8.2  --  14.1*  HGB 12.6   --  9.3*  HCT 39.1  --  29.1*  PLT 366  --  280  NA 137  --  138  K 4.1  --  4.4  CL 102  --  105  CO2 28  --  24  BUN 22  --  26*  CREATININE 0.71  --  0.97  GLUCOSE 102*  --  170*  INR 0.9  --   --   CALCIUM 9.3   < > 8.5*   < > = values in this interval not displayed.     Discharge Medications:   Allergies as of 10/12/2019      Reactions   Nickel Itching, Swelling, Rash   Possibility...still trying to figure out   Palladium Chloride Other (See Comments)   Positive patch test    Statins Other (See Comments)   Atorvastatin, pravastatin, simvastatin->myalgias   Bacitracin Rash   Gold Sodium Thiosulfate Rash      Medication List    STOP taking these medications   alendronate 70 MG tablet Commonly known as: FOSAMAX   aspirin 81 MG tablet   chlorthalidone 25 MG tablet Commonly known as: HYGROTON     TAKE these medications   acetaminophen 500 MG tablet Commonly known as: TYLENOL Take 1,000 mg by mouth every 6 (six) hours as needed for moderate pain or headache.   ALPRAZolam 0.25 MG tablet Commonly known as: XANAX Take 0.0625-0.25 mg by mouth 2 (two) times daily as needed for anxiety.   amLODipine 5 MG tablet Commonly known as: NORVASC Take 5 mg by mouth daily.   apixaban 2.5 MG Tabs tablet Commonly known as: Eliquis Take 1 tablet (2.5 mg total) by mouth daily.   citalopram 10 MG tablet Commonly known as: CELEXA Take 10 mg by mouth daily.   docusate sodium 100 MG capsule Commonly known as: COLACE 1 tab 2 times a day while on narcotics.  STOOL SOFTENER   gabapentin 300 MG capsule Commonly known as: NEURONTIN Take 1 capsule (300 mg total) by mouth at bedtime. .for nerve pain   metoprolol succinate 25 MG 24 hr tablet Commonly known as: TOPROL-XL Take 25 mg by mouth daily.   oxyCODONE 5 MG immediate release tablet Commonly known as: Oxy IR/ROXICODONE Take 1 tablet (5 mg total) by mouth every 4 (four) hours as needed (pain).   polyethylene glycol 17  g packet Commonly known as: MIRALAX / GLYCOLAX 17grams in 6 oz of something to drink twice a day until bowel movement.  LAXITIVE.  Restart if two days since last bowel movement   pravastatin 40 MG tablet Commonly known as: PRAVACHOL Take 40 mg by mouth once a week.   VITAMIN B-12 IJ Inject 1 mL as directed every 30 (thirty) days.            Discharge Care Instructions  (From admission, onward)         Start     Ordered   10/12/19 0000  Change dressing       Comments: Change the gauze dressing daily with sterile 4 x 4 inch gauze and apply TED hose.  DO NOT REMOVE BANDAGE OVER SURGICAL INCISION.  [place] WHOLE LEG INCLUDING OVER THE WATERPROOF BANDAGE WITH SOAP AND WATER EVERY DAY.   10/12/19 7654          Diagnostic Studies: No results found.  Disposition: Discharge disposition: 01-Home or Self Care       Discharge Instructions    CPM   Complete by: As directed    Continuous passive motion machine (CPM):      Use the CPM from 0 to 90 for 6 hours per day.       You may break it up into 2 or 3 sessions per day.      Use CPM for 2 weeks or until you are told to stop.   Call MD / Call 911   Complete by: As directed    If you experience chest pain or shortness of breath, CALL 911 and be transported to the hospital emergency room.  If you develope a fever above 101 F, pus (white drainage) or increased drainage or redness at the wound, or calf pain, call your surgeon's office.   Change dressing   Complete by: As directed    Change the gauze dressing daily with sterile 4 x 4 inch gauze and apply TED hose.  DO NOT REMOVE BANDAGE OVER SURGICAL INCISION.  [place] WHOLE LEG INCLUDING OVER THE WATERPROOF BANDAGE WITH SOAP AND WATER EVERY DAY.   Constipation Prevention   Complete by: As directed    Drink plenty of fluids.  Prune juice may be helpful.  You may use a stool softener, such as Colace (over the counter) 100 mg twice a day.  Use MiraLax (over the counter) for constipation  as needed.   Diet - low sodium heart healthy   Complete by: As directed    Discharge instructions   Complete by: As directed    INSTRUCTIONS AFTER JOINT REPLACEMENT   DO NOT TAKE HYGROTIN FOR 3-4 DAYS AFTER SURGERY    Remove items at home which could result in a fall. This includes throw rugs or furniture in walking pathways You may notice swelling that will progress down to the foot and ankle.  This is normal after surgery.  Elevate your leg when you are not up walking on it.   Continue to use the breathing machine you got in the hospital (incentive spirometer) which will help keep your temperature down.  It is common for your temperature to cycle up and down following surgery, especially at night when you are not up moving around and exerting yourself.  The breathing machine keeps your lungs expanded and your temperature down.   DIET:  As you were doing prior to hospitalization, we recommend a well-balanced diet.  DRESSING / WOUND CARE / SHOWERING  Keep the surgical dressing until follow up.  The dressing is water proof, so you can shower without any extra covering.  IF THE DRESSING FALLS OFF or the wound gets wet inside, change the dressing with sterile gauze.  Please use good hand washing techniques before changing the dressing.  Do not use any lotions or creams on the incision until instructed by your surgeon.    ACTIVITY  Increase activity slowly as tolerated, but follow the weight bearing instructions below.   No driving for 6 weeks or until further direction given by your physician.  You cannot drive while taking narcotics.  No lifting or carrying greater than 10 lbs. until further directed by your surgeon. Avoid periods of inactivity such as sitting longer than an hour when not asleep. This helps prevent blood clots.  You may return to work once you are authorized by your doctor.     WEIGHT BEARING   Weight bearing as tolerated with assist device (walker, cane, etc) as  directed, use it as long as suggested by your surgeon or therapist, typically at least 1-2 WEEKS   EXERCISES  Results after joint replacement surgery are often greatly improved when you follow the exercise, range of motion and muscle strengthening exercises prescribed by your doctor. Safety measures are also important to protect the joint from further injury. Any time any of these exercises cause you to have increased pain or swelling, decrease what you are doing until you are comfortable again and then slowly increase them. If you have problems or questions, call your caregiver or physical therapist for advice.   Rehabilitation is important following a joint replacement. After just a few days of immobilization, the muscles of the leg can become weakened and shrink (atrophy).  These exercises are designed to build up the tone and strength of the thigh and leg muscles and to improve motion. Often times heat used for twenty to thirty minutes before working out will loosen up your tissues and help with improving the range of motion but do not use heat for the first two weeks following surgery (sometimes heat can increase post-operative swelling).   These exercises can be done on a training (exercise) mat, on the floor, on a table or on a bed. Use whatever works the best and is most comfortable for you.    Use music or television while you are exercising so that the exercises are a pleasant break in your day. This will make your life better with the exercises acting as a break in your routine that you can look forward to.   Perform all exercises about fifteen times, three times per day or as directed.  You should exercise both the operative leg and the other leg as well.  Exercises include:   Quad Sets - Tighten up the muscle on the front of the thigh (Quad) and hold for 5-10 seconds.   Straight Leg Raises - With your knee straight (if you were given a brace, keep it on), lift the leg to 60 degrees, hold  for 3 seconds, and slowly lower the leg.  Perform this exercise against resistance later as your leg gets stronger.  Leg Slides: Lying on your back, slowly slide your foot toward your buttocks, bending your knee up off the floor (only go as far as is comfortable). Then slowly slide your foot back down until your leg is flat on the floor again.  Angel Wings: Lying on your back spread your legs to the side as  far apart as you can without causing discomfort.  Hamstring Strength:  Lying on your back, push your heel against the floor with your leg straight by tightening up the muscles of your buttocks.  Repeat, but this time bend your knee to a comfortable angle, and push your heel against the floor.  You may put a pillow under the heel to make it more comfortable if necessary.   A rehabilitation program following joint replacement surgery can speed recovery and prevent re-injury in the future due to weakened muscles. Contact your doctor or a physical therapist for more information on knee rehabilitation.    CONSTIPATION  Constipation is defined medically as fewer than three stools per week and severe constipation as less than one stool per week.  Even if you have a regular bowel pattern at home, your normal regimen is likely to be disrupted due to multiple reasons following surgery.  Combination of anesthesia, postoperative narcotics, change in appetite and fluid intake all can affect your bowels.   YOU MUST use at least one of the following options; they are listed in order of increasing strength to get the job done.  They are all available over the counter, and you may need to use some, POSSIBLY even all of these options:    Drink plenty of fluids (prune juice may be helpful) and high fiber foods Colace 100 mg by mouth twice a day  Senokot for constipation as directed and as needed Dulcolax (bisacodyl), take with full glass of water  Miralax (polyethylene glycol) once or twice a day as needed.  If  you have tried all these things and are unable to have a bowel movement in the first 3-4 days after surgery call either your surgeon or your primary doctor.    If you experience loose stools or diarrhea, hold the medications until you stool forms back up.  If your symptoms do not get better within 1 week or if they get worse, check with your doctor.  If you experience "the worst abdominal pain ever" or develop nausea or vomiting, please contact the office immediately for further recommendations for treatment.   ITCHING:  If you experience itching with your medications, try taking only a single pain pill, or even half a pain pill at a time.  You can also use Benadryl over the counter for itching or also to help with sleep.   TED HOSE STOCKINGS:  Use stockings on both legs until for at least 2 weeks or as directed by physician office. They may be removed at night for sleeping.  MEDICATIONS:  See your medication summary on the "After Visit Summary" that nursing will review with you.  You may have some home medications which will be placed on hold until you complete the course of blood thinner medication.  It is important for you to complete the blood thinner medication as prescribed.  PRECAUTIONS:  If you experience chest pain or shortness of breath - call 911 immediately for transfer to the hospital emergency department.   If you develop a fever greater that 101 F, purulent drainage from wound, increased redness or drainage from wound, foul odor from the wound/dressing, or calf pain - CONTACT YOUR SURGEON.  FOLLOW-UP APPOINTMENTS:  If you do not already have a post-op appointment, please call the office for an appointment to be seen by your surgeon.  Guidelines for how soon to be seen are listed in your "After Visit Summary", but are typically between 1-4 weeks after surgery.  OTHER INSTRUCTIONS:   Knee Replacement:  Do not place pillow under knee,  focus on keeping the knee straight while resting. CPM instructions: 0-90 degrees, 2 hours in the morning, 2 hours in the afternoon, and 2 hours in the evening. Place foam block, curve side up under heel at all times except when in CPM or when walking.  DO NOT modify, tear, cut, or change the foam block in any way.   DENTAL ANTIBIOTICS:  In most cases prophylactic antibiotics for Dental procdeures after total joint surgery are not necessary.  Exceptions are as follows:  1. History of prior total joint infection  2. Severely immunocompromised (Organ Transplant, cancer chemotherapy, Rheumatoid biologic meds such as [place])  3. Poorly controlled diabetes (A1C &gt; 8.0, blood glucose over 200)  If you have one of these conditions, contact your surgeon for an antibiotic prescription, prior to your dental procedure.   MAKE SURE YOU:  Understand these instructions.  Get help right away if you are not doing well or get worse.    Thank you for letting us be a part of your medical care team.  It is a privilege we respect greatly.  We hope these instructions will help you stay on track for a fast and full recovery!   Do not put a pillow under the knee. Place it under the heel.   Complete by: As directed    Place gray foam block, curve side up under heel at all times except when in CPM or when walking.  DO NOT modify, tear, cut, or change in any way the gray foam block.   Increase activity slowly as tolerated   Complete by: As directed    Patient may shower   Complete by: As directed    Aquacel dressing is water proof    Wash over it and the whole leg with soap and water at the end of your shower   TED hose   Complete by: As directed    Use stockings (TED hose) for 2 weeks on both leg(s).  You may remove them at night for sleeping.       Follow-up Information    Elsie Saas, MD. Go on 10/22/2019.   Specialty: Orthopedic Surgery Why: Your appointment is scheduled for 1:50  Contact  information: [address] [place] [address] [phone]        [facility]. Go on 10/16/2019.   Why: You will be contacted by Verl Dicker for start of care time.                Signed: Linda Hedges 10/12/2019, 8:23 AM

## 2019-10-14 ENCOUNTER — Encounter (HOSPITAL_COMMUNITY): Payer: Self-pay | Admitting: Orthopedic Surgery

## 2019-10-14 DIAGNOSIS — M1711 Unilateral primary osteoarthritis, right knee: Secondary | ICD-10-CM | POA: Diagnosis not present

## 2019-10-14 DIAGNOSIS — R262 Difficulty in walking, not elsewhere classified: Secondary | ICD-10-CM | POA: Diagnosis not present

## 2019-10-14 DIAGNOSIS — M6281 Muscle weakness (generalized): Secondary | ICD-10-CM | POA: Diagnosis not present

## 2019-10-14 DIAGNOSIS — I2510 Atherosclerotic heart disease of native coronary artery without angina pectoris: Secondary | ICD-10-CM | POA: Diagnosis present

## 2019-10-14 DIAGNOSIS — I10 Essential (primary) hypertension: Secondary | ICD-10-CM | POA: Diagnosis present

## 2019-10-14 DIAGNOSIS — D649 Anemia, unspecified: Secondary | ICD-10-CM | POA: Diagnosis present

## 2019-10-14 DIAGNOSIS — M5126 Other intervertebral disc displacement, lumbar region: Secondary | ICD-10-CM | POA: Diagnosis present

## 2019-10-14 DIAGNOSIS — Z471 Aftercare following joint replacement surgery: Secondary | ICD-10-CM | POA: Diagnosis not present

## 2019-10-14 DIAGNOSIS — M009 Pyogenic arthritis, unspecified: Secondary | ICD-10-CM | POA: Diagnosis present

## 2019-10-17 ENCOUNTER — Encounter (HOSPITAL_COMMUNITY): Payer: Self-pay | Admitting: Orthopedic Surgery

## 2019-10-17 NOTE — Anesthesia Postprocedure Evaluation (Signed)
Anesthesia Post Note  Patient: Mackenzie [name]  Procedure(s) Performed: TOTAL KNEE ARTHROPLASTY (Right Knee)     Patient location during evaluation: PACU Anesthesia Type: Regional, MAC and Spinal Level of consciousness: awake and alert Pain management: pain level controlled Vital Signs Assessment: post-procedure vital signs reviewed and stable Respiratory status: spontaneous breathing, nonlabored ventilation, respiratory function stable and patient connected to nasal cannula oxygen Cardiovascular status: stable and blood pressure returned to baseline Postop Assessment: no apparent nausea or vomiting and spinal receding Anesthetic complications: no   No complications documented.  Last Vitals:  Vitals:   10/12/19 0740 10/12/19 0927  BP: (!) 145/56 (!) 154/55  Pulse: 68 (!) 56  Resp:  14  Temp:  36.7 C  SpO2:  99%    Last Pain:  Vitals:   10/12/19 1009  TempSrc:   PainSc: 5                  [name]

## 2019-10-18 DIAGNOSIS — R262 Difficulty in walking, not elsewhere classified: Secondary | ICD-10-CM | POA: Diagnosis not present

## 2019-10-18 DIAGNOSIS — Z471 Aftercare following joint replacement surgery: Secondary | ICD-10-CM | POA: Diagnosis not present

## 2019-10-18 DIAGNOSIS — M1711 Unilateral primary osteoarthritis, right knee: Secondary | ICD-10-CM | POA: Diagnosis not present

## 2019-10-18 DIAGNOSIS — M6281 Muscle weakness (generalized): Secondary | ICD-10-CM | POA: Diagnosis not present

## 2019-10-21 DIAGNOSIS — Z471 Aftercare following joint replacement surgery: Secondary | ICD-10-CM | POA: Diagnosis not present

## 2019-10-21 DIAGNOSIS — R262 Difficulty in walking, not elsewhere classified: Secondary | ICD-10-CM | POA: Diagnosis not present

## 2019-10-21 DIAGNOSIS — M1711 Unilateral primary osteoarthritis, right knee: Secondary | ICD-10-CM | POA: Diagnosis not present

## 2019-10-21 DIAGNOSIS — M6281 Muscle weakness (generalized): Secondary | ICD-10-CM | POA: Diagnosis not present

## 2019-10-22 DIAGNOSIS — M1711 Unilateral primary osteoarthritis, right knee: Secondary | ICD-10-CM | POA: Diagnosis not present

## 2019-10-23 DIAGNOSIS — M1711 Unilateral primary osteoarthritis, right knee: Secondary | ICD-10-CM | POA: Diagnosis not present

## 2019-10-23 DIAGNOSIS — Z471 Aftercare following joint replacement surgery: Secondary | ICD-10-CM | POA: Diagnosis not present

## 2019-10-23 DIAGNOSIS — R262 Difficulty in walking, not elsewhere classified: Secondary | ICD-10-CM | POA: Diagnosis not present

## 2019-10-23 DIAGNOSIS — M6281 Muscle weakness (generalized): Secondary | ICD-10-CM | POA: Diagnosis not present

## 2019-10-24 DIAGNOSIS — Z471 Aftercare following joint replacement surgery: Secondary | ICD-10-CM | POA: Diagnosis not present

## 2019-10-24 DIAGNOSIS — M1711 Unilateral primary osteoarthritis, right knee: Secondary | ICD-10-CM | POA: Diagnosis not present

## 2019-10-24 DIAGNOSIS — M6281 Muscle weakness (generalized): Secondary | ICD-10-CM | POA: Diagnosis not present

## 2019-10-24 DIAGNOSIS — R262 Difficulty in walking, not elsewhere classified: Secondary | ICD-10-CM | POA: Diagnosis not present

## 2019-10-28 DIAGNOSIS — Z471 Aftercare following joint replacement surgery: Secondary | ICD-10-CM | POA: Diagnosis not present

## 2019-10-28 DIAGNOSIS — R262 Difficulty in walking, not elsewhere classified: Secondary | ICD-10-CM | POA: Diagnosis not present

## 2019-10-28 DIAGNOSIS — M6281 Muscle weakness (generalized): Secondary | ICD-10-CM | POA: Diagnosis not present

## 2019-10-28 DIAGNOSIS — M1711 Unilateral primary osteoarthritis, right knee: Secondary | ICD-10-CM | POA: Diagnosis not present

## 2019-10-31 DIAGNOSIS — M1711 Unilateral primary osteoarthritis, right knee: Secondary | ICD-10-CM | POA: Diagnosis not present

## 2019-10-31 DIAGNOSIS — Z471 Aftercare following joint replacement surgery: Secondary | ICD-10-CM | POA: Diagnosis not present

## 2019-10-31 DIAGNOSIS — R262 Difficulty in walking, not elsewhere classified: Secondary | ICD-10-CM | POA: Diagnosis not present

## 2019-10-31 DIAGNOSIS — M6281 Muscle weakness (generalized): Secondary | ICD-10-CM | POA: Diagnosis not present

## 2019-11-04 DIAGNOSIS — Z471 Aftercare following joint replacement surgery: Secondary | ICD-10-CM | POA: Diagnosis not present

## 2019-11-04 DIAGNOSIS — R262 Difficulty in walking, not elsewhere classified: Secondary | ICD-10-CM | POA: Diagnosis not present

## 2019-11-04 DIAGNOSIS — M6281 Muscle weakness (generalized): Secondary | ICD-10-CM | POA: Diagnosis not present

## 2019-11-04 DIAGNOSIS — M1711 Unilateral primary osteoarthritis, right knee: Secondary | ICD-10-CM | POA: Diagnosis not present

## 2019-11-05 DIAGNOSIS — M1711 Unilateral primary osteoarthritis, right knee: Secondary | ICD-10-CM | POA: Diagnosis not present

## 2019-11-07 DIAGNOSIS — R262 Difficulty in walking, not elsewhere classified: Secondary | ICD-10-CM | POA: Diagnosis not present

## 2019-11-07 DIAGNOSIS — Z471 Aftercare following joint replacement surgery: Secondary | ICD-10-CM | POA: Diagnosis not present

## 2019-11-07 DIAGNOSIS — M6281 Muscle weakness (generalized): Secondary | ICD-10-CM | POA: Diagnosis not present

## 2019-11-07 DIAGNOSIS — M1711 Unilateral primary osteoarthritis, right knee: Secondary | ICD-10-CM | POA: Diagnosis not present

## 2019-11-11 DIAGNOSIS — R262 Difficulty in walking, not elsewhere classified: Secondary | ICD-10-CM | POA: Diagnosis not present

## 2019-11-11 DIAGNOSIS — Z471 Aftercare following joint replacement surgery: Secondary | ICD-10-CM | POA: Diagnosis not present

## 2019-11-11 DIAGNOSIS — M6281 Muscle weakness (generalized): Secondary | ICD-10-CM | POA: Diagnosis not present

## 2019-11-11 DIAGNOSIS — M1711 Unilateral primary osteoarthritis, right knee: Secondary | ICD-10-CM | POA: Diagnosis not present

## 2019-11-12 DIAGNOSIS — H35372 Puckering of macula, left eye: Secondary | ICD-10-CM | POA: Diagnosis not present

## 2019-11-12 DIAGNOSIS — H353131 Nonexudative age-related macular degeneration, bilateral, early dry stage: Secondary | ICD-10-CM | POA: Diagnosis not present

## 2019-11-12 DIAGNOSIS — Z961 Presence of intraocular lens: Secondary | ICD-10-CM | POA: Diagnosis not present

## 2019-11-14 DIAGNOSIS — M1711 Unilateral primary osteoarthritis, right knee: Secondary | ICD-10-CM | POA: Diagnosis not present

## 2019-11-14 DIAGNOSIS — R262 Difficulty in walking, not elsewhere classified: Secondary | ICD-10-CM | POA: Diagnosis not present

## 2019-11-14 DIAGNOSIS — Z471 Aftercare following joint replacement surgery: Secondary | ICD-10-CM | POA: Diagnosis not present

## 2019-11-14 DIAGNOSIS — M6281 Muscle weakness (generalized): Secondary | ICD-10-CM | POA: Diagnosis not present

## 2019-11-18 DIAGNOSIS — Z471 Aftercare following joint replacement surgery: Secondary | ICD-10-CM | POA: Diagnosis not present

## 2019-11-18 DIAGNOSIS — R262 Difficulty in walking, not elsewhere classified: Secondary | ICD-10-CM | POA: Diagnosis not present

## 2019-11-18 DIAGNOSIS — M1711 Unilateral primary osteoarthritis, right knee: Secondary | ICD-10-CM | POA: Diagnosis not present

## 2019-11-18 DIAGNOSIS — M6281 Muscle weakness (generalized): Secondary | ICD-10-CM | POA: Diagnosis not present

## 2019-11-21 DIAGNOSIS — M6281 Muscle weakness (generalized): Secondary | ICD-10-CM | POA: Diagnosis not present

## 2019-11-21 DIAGNOSIS — M5459 Other low back pain: Secondary | ICD-10-CM | POA: Diagnosis not present

## 2019-11-21 DIAGNOSIS — Z471 Aftercare following joint replacement surgery: Secondary | ICD-10-CM | POA: Diagnosis not present

## 2019-11-21 DIAGNOSIS — R262 Difficulty in walking, not elsewhere classified: Secondary | ICD-10-CM | POA: Diagnosis not present

## 2019-11-28 DIAGNOSIS — Z471 Aftercare following joint replacement surgery: Secondary | ICD-10-CM | POA: Diagnosis not present

## 2019-11-28 DIAGNOSIS — M5459 Other low back pain: Secondary | ICD-10-CM | POA: Diagnosis not present

## 2019-11-28 DIAGNOSIS — R262 Difficulty in walking, not elsewhere classified: Secondary | ICD-10-CM | POA: Diagnosis not present

## 2019-11-28 DIAGNOSIS — M6281 Muscle weakness (generalized): Secondary | ICD-10-CM | POA: Diagnosis not present

## 2019-11-28 DIAGNOSIS — Z23 Encounter for immunization: Secondary | ICD-10-CM | POA: Diagnosis not present

## 2019-12-02 DIAGNOSIS — R262 Difficulty in walking, not elsewhere classified: Secondary | ICD-10-CM | POA: Diagnosis not present

## 2019-12-02 DIAGNOSIS — Z471 Aftercare following joint replacement surgery: Secondary | ICD-10-CM | POA: Diagnosis not present

## 2019-12-02 DIAGNOSIS — M5459 Other low back pain: Secondary | ICD-10-CM | POA: Diagnosis not present

## 2019-12-02 DIAGNOSIS — M6281 Muscle weakness (generalized): Secondary | ICD-10-CM | POA: Diagnosis not present

## 2019-12-03 DIAGNOSIS — M1711 Unilateral primary osteoarthritis, right knee: Secondary | ICD-10-CM | POA: Diagnosis not present

## 2019-12-06 ENCOUNTER — Ambulatory Visit
Admission: RE | Admit: 2019-12-06 | Discharge: 2019-12-06 | Disposition: A | Discharge status: Home / Self Care | Payer: Medicare Other | Source: Ambulatory Visit | Attending: Physical Medicine & Rehabilitation | Admitting: Physical Medicine & Rehabilitation

## 2019-12-06 ENCOUNTER — Other Ambulatory Visit: Payer: Self-pay | Admitting: Physical Medicine & Rehabilitation

## 2019-12-06 DIAGNOSIS — R102 Pelvic and perineal pain: Secondary | ICD-10-CM

## 2019-12-06 DIAGNOSIS — M25552 Pain in left hip: Secondary | ICD-10-CM | POA: Diagnosis not present

## 2019-12-06 DIAGNOSIS — M533 Sacrococcygeal disorders, not elsewhere classified: Secondary | ICD-10-CM | POA: Diagnosis not present

## 2019-12-09 ENCOUNTER — Encounter (INDEPENDENT_AMBULATORY_CARE_PROVIDER_SITE_OTHER): Payer: Medicare Other | Admitting: Ophthalmology

## 2019-12-16 DIAGNOSIS — M5416 Radiculopathy, lumbar region: Secondary | ICD-10-CM | POA: Diagnosis not present

## 2019-12-17 DIAGNOSIS — M5416 Radiculopathy, lumbar region: Secondary | ICD-10-CM | POA: Diagnosis not present

## 2019-12-24 ENCOUNTER — Ambulatory Visit (INDEPENDENT_AMBULATORY_CARE_PROVIDER_SITE_OTHER): Payer: Medicare Other | Admitting: Ophthalmology

## 2019-12-24 ENCOUNTER — Encounter (INDEPENDENT_AMBULATORY_CARE_PROVIDER_SITE_OTHER): Payer: Self-pay | Admitting: Ophthalmology

## 2019-12-24 ENCOUNTER — Other Ambulatory Visit: Payer: Self-pay

## 2019-12-24 DIAGNOSIS — H43813 Vitreous degeneration, bilateral: Secondary | ICD-10-CM | POA: Diagnosis not present

## 2019-12-24 DIAGNOSIS — H35372 Puckering of macula, left eye: Secondary | ICD-10-CM | POA: Diagnosis not present

## 2019-12-24 DIAGNOSIS — Z961 Presence of intraocular lens: Secondary | ICD-10-CM | POA: Diagnosis not present

## 2019-12-24 DIAGNOSIS — H43811 Vitreous degeneration, right eye: Secondary | ICD-10-CM | POA: Insufficient documentation

## 2019-12-24 DIAGNOSIS — H353132 Nonexudative age-related macular degeneration, bilateral, intermediate dry stage: Secondary | ICD-10-CM | POA: Diagnosis not present

## 2019-12-24 MED ORDER — OFLOXACIN 0.3 % OP SOLN: 1.0000 [drp] | Freq: Four times a day (QID) | OPHTHALMIC | 0 refills | Status: AC

## 2019-12-24 MED ORDER — PREDNISOLONE ACETATE 1 % OP SUSP: 1.0000 [drp] | Freq: Four times a day (QID) | OPHTHALMIC | 0 refills | Status: AC

## 2019-12-24 NOTE — Progress Notes (Signed)
12/24/2019     CHIEF COMPLAINT Patient presents for Blurred Vision   HISTORY OF PRESENT ILLNESS: Mackenzie [name] is a 79 y.o. female who presents to the clinic today for:   HPI    Blurred Vision    In left eye.  Onset was gradual.  Vision is blurred.  Severity is moderate.  Occurring constantly.  It is worse throughout the day.  Context:  near vision, distance vision, night driving and dim lighting.  Since onset it is stable.  Treatments tried include no treatments.  Response to treatment was no improvement.  I, the attending physician,  performed the HPI with the patient and updated documentation appropriately.          Comments    Pt referred by Dr. Gershon Crane for ERM OS and Early AMD OU.  Patient complains of distorted vision and curved lines with the left eye  Pt c/o double vision with NVA, OS>OD. Pt states she has trouble seeing at night. Denies FOL and floaters.       Last edited by Hurman Horn, MD on 12/24/2019  3:07 PM. (History)      Referring physician: Asencion Noble, MD [address] [place],  [place] [address]  HISTORICAL INFORMATION:   Selected notes from the MEDICAL RECORD NUMBER    Lab Results  Component Value Date   HGBA1C 5.8 (H) 01/31/2012     CURRENT MEDICATIONS: No current outpatient medications on file. (Ophthalmic Drugs)   No current facility-administered medications for this visit. (Ophthalmic Drugs)   Current Outpatient Medications (Other)  Medication Sig  . acetaminophen (TYLENOL) 500 MG tablet Take 1,000 mg by mouth every 6 (six) hours as needed for moderate pain or headache.  . ALPRAZolam (XANAX) 0.25 MG tablet Take 0.0625-0.25 mg by mouth 2 (two) times daily as needed for anxiety.   Marland Kitchen amLODipine (NORVASC) 5 MG tablet Take 5 mg by mouth daily.  Marland Kitchen apixaban (ELIQUIS) 2.5 MG TABS tablet Take 1 tablet (2.5 mg total) by mouth daily.  . citalopram (CELEXA) 10 MG tablet Take 10 mg by mouth daily.  . Cyanocobalamin (VITAMIN B-12 IJ) Inject 1 mL as  directed every 30 (thirty) days.  Marland Kitchen docusate sodium (COLACE) 100 MG capsule 1 tab 2 times a day while on narcotics.  STOOL SOFTENER  . gabapentin (NEURONTIN) 300 MG capsule Take 1 capsule (300 mg total) by mouth at bedtime. .for nerve pain  . metoprolol succinate (TOPROL-XL) 25 MG 24 hr tablet Take 25 mg by mouth daily.    Marland Kitchen oxyCODONE (OXY IR/ROXICODONE) 5 MG immediate release tablet Take 1 tablet (5 mg total) by mouth every 4 (four) hours as needed (pain).  . polyethylene glycol (MIRALAX / GLYCOLAX) 17 g packet 17grams in 6 oz of something to drink twice a day until bowel movement.  LAXITIVE.  Restart if two days since last bowel movement  . pravastatin (PRAVACHOL) 40 MG tablet Take 40 mg by mouth once a week.    No current facility-administered medications for this visit. (Other)   Facility-Administered Medications Ordered in Other Visits (Other)  Medication Route  . 0.9 %  sodium chloride infusion Intravenous  . acetaminophen (TYLENOL) tablet 650 mg Oral   Or  . acetaminophen (TYLENOL) suppository 650 mg Rectal      REVIEW OF SYSTEMS:    ALLERGIES Allergies  Allergen Reactions  . Nickel Itching, Swelling and Rash    Possibility...still trying to figure out   . Palladium Chloride Other (See Comments)  Positive patch test   . Statins Other (See Comments)    Atorvastatin, pravastatin, simvastatin->myalgias   . Bacitracin Rash  . Gold Sodium Thiosulfate Rash    PAST MEDICAL HISTORY Past Medical History:  Diagnosis Date  . Anemia, normocytic normochromic 01/2012   hx of  . Anxiety    takes Xanax daily as needed  . Arthritis    knees, hands  . Cataract    right eye  . Depression    takes Citalopram daily  . Dizziness    cardiologist is aware and told pt it was related to meds  . History of bronchitis    2014  . History of colon polyps    benign  . History of migraine   . Hyperlipidemia    takes Pravastatin daily  . Hypertension    takes Metoprolol and Azor  daily  . Joint pain   . Pneumonia    hx of-2015  . Primary localized osteoarthritis of right knee 10/01/2019  . Pseudogout of knee, right 02/22/2017  . Renal insufficiency 02/22/2017  . Rheumatic fever    hx of  . Wheezing on expiration    Past Surgical History:  Procedure Laterality Date  . ABDOMINAL HYSTERECTOMY    . APPENDECTOMY    . CARDIAC CATHETERIZATION     early 2000's  . CARDIAC CATHETERIZATION N/A 03/04/2015   Procedure: Left Heart Cath and Coronary Angiography;  Surgeon: Sherren Mocha, MD;  Location: [facility];  Service: Cardiovascular;  Laterality: N/A;  . CATARACT EXTRACTION Bilateral   . cataract surgery Left   . CHOLECYSTECTOMY    . CHONDROPLASTY Right 02/21/2017   Procedure: CHONDROPLASTY WITH DEBRIDEMENT;  Surgeon: Elsie Saas, MD;  Location: [facility];  Service: Orthopedics;  Laterality: Right;  . COLONOSCOPY  2005   Negative screening study  . COLONOSCOPY N/A 04/04/2014   Procedure: COLONOSCOPY;  Surgeon: Rogene Houston, MD;  Location: [facility];  Service: Endoscopy;  Laterality: N/A;  1225  . EXPLORATORY LAPAROTOMY     x 3  . EYE SURGERY    . IR GENERIC HISTORICAL  10/15/2015   IR RADIOLOGIST EVAL & MGMT 10/15/2015 [facility]  . IR GENERIC HISTORICAL  10/20/2015   IR KYPHO LUMBAR INC FX REDUCE BONE BX UNI/BIL CANNULATION INC/IMAGING 10/20/2015 Luanne Bras, MD [facility]  . IRRIGATION AND DEBRIDEMENT KNEE Right 02/21/2017   Procedure: IRRIGATION AND DEBRIDEMENT KNEE;  Surgeon: Elsie Saas, MD;  Location: [facility];  Service: Orthopedics;  Laterality: Right;  . LUMBAR LAMINECTOMY/DECOMPRESSION MICRODISCECTOMY Left 01/24/2018   Procedure: LEFT LUMBAR FOUR- LUMBAR FIVE  LAMINOTOMY AND MICRODISCECTOMY;  Surgeon: Jovita Gamma, MD;  Location: [facility];  Service: Neurosurgery;  Laterality: Left;  LEFT LUMBAR 4- LUMBAR 5  LAMINOTOMY AND MICRODISCECTOMY  . TOTAL HIP ARTHROPLASTY Right 12/16/2014   Procedure: TOTAL HIP  ARTHROPLASTY ANTERIOR APPROACH;  Surgeon: Renette Butters, MD;  Location: [facility];  Service: Orthopedics;  Laterality: Right;  . TOTAL KNEE ARTHROPLASTY Right 10/11/2019   Procedure: TOTAL KNEE ARTHROPLASTY;  Surgeon: Elsie Saas, MD;  Location: [facility];  Service: Orthopedics;  Laterality: Right;    FAMILY HISTORY Family History  Problem Relation Age of Onset  . Heart attack Mother 58       heart problems  . Cancer Mother        uterus  . Pancreatitis Sister        died  age 52  . Colon cancer Maternal Aunt 81  .  Colon cancer Maternal Aunt 78  . Colon cancer Maternal Aunt 76  . Cancer Other        FH  . Diabetes Other        FH  . Hypertension Other        FH  . Diabetes Son   . Diabetes Daughter   . Colon cancer Maternal Uncle 40    SOCIAL HISTORY Social History   Tobacco Use  . Smoking status: Former Smoker    Packs/day: 1.00    Years: 15.00    Pack years: 15.00  . Smokeless tobacco: Never Used  . Tobacco comment: quit smoking 30+yrs ago  Vaping Use  . Vaping Use: Never used  Substance Use Topics  . Alcohol use: Yes    Alcohol/week: 3.0 standard drinks    Types: 3 Glasses of wine per week    Comment: socially wine  . Drug use: No         OPHTHALMIC EXAM:  Base Eye Exam    Visual Acuity (Snellen - Linear)      Right Left   Dist [place] 20/40 20/400 -   Dist ph [place] NI 20/200       Tonometry (Tonopen, 2:17 PM)      Right Left   Pressure 12 15       Pupils      Pupils Dark Light Shape React APD   Right PERRL 3 2 Round Sluggish None   Left PERRL 3 2 Round Sluggish None       Visual Fields (Counting fingers)      Left Right    Full Full       Neuro/Psych    Mood/Affect: Normal       Dilation    Both eyes: 1.0% Mydriacyl, 2.5% Phenylephrine @ 2:17 PM        Slit Lamp and Fundus Exam    External Exam      Right Left   External Normal Normal       Slit Lamp Exam      Right Left   Lids/Lashes Normal Normal   Conjunctiva/Sclera White and  quiet White and quiet   Cornea Clear Clear   Anterior Chamber Deep and quiet Deep and quiet   Iris Round and reactive Round and reactive   Anterior Vitreous Normal Normal       Fundus Exam      Right Left   Posterior Vitreous Posterior vitreous detachment Posterior vitreous detachment   Disc Normal Normal   C/D Ratio 0.4 0.4   Macula Soft drusen, Intermediate age related macular degeneration Soft drusen, Intermediate age related macular degeneration,, Epiretinal membrane with severe topographic distortion   Vessels Normal Normal   Periphery Normal Normal          IMAGING AND PROCEDURES  Imaging and Procedures for 12/24/19  OCT, Retina - OU - Both Eyes       Right Eye Quality was good. Scan locations included subfoveal. Central Foveal Thickness: 255. Progression has no prior data. Findings include retinal drusen .   Left Eye Quality was good. Scan locations included subfoveal. Central Foveal Thickness: 429. Progression has no prior data. Findings include retinal drusen , epiretinal membrane.   Notes Severe epiretinal membrane with macular thickening and distortion, OS                   ASSESSMENT/PLAN:  Macular pucker, left eye The nature of macular pucker (epiretinal membrane ERM) was discussed with  the patient as well as threshold criteria for vitrectomy surgery. I explained that in rare cases another surgery is needed to actually remove a second wrinkle should it regrow.  Most often, the epiretinal membrane and underlying wrinkled internal limiting membrane are removed with the first surgery, to accomplish the goals.   If the operative eye is Phakic (natural lens still present), cataract surgery is often recommended prior to Vitrectomy. This will enable the retina surgeon to have the best view during surgery and the patient to obtain optimal results in the future. Treatment options were discussed.   OS, will need to consider vitrectomy with membrane peel left  eye in order to release the traction and to allow for visual acuity recovery.  I explained to the patient that the age-related macular degeneration will not be changed by this intervention, and it may in fact prevent complete recovery.  Intermediate stage nonexudative age-related macular degeneration of both eyes The nature of age--related macular degeneration was discussed with the patient as well as the distinction between dry and wet types. Checking an Amsler Grid daily with advice to return immediately should a distortion develop, was given to the patient. The patient 's smoking status now and in the past was determined and advice based on the AREDS study was provided regarding the consumption of antioxidant supplements. AREDS 2 vitamin formulation was recommended. Consumption of dark leafy vegetables and fresh fruits of various colors was recommended. Treatment modalities for wet macular degeneration particularly the use of intravitreal injections of anti-blood vessel growth factors was discussed with the patient. Avastin, Lucentis, and Eylea are the available options. On occasion, therapy includes the use of photodynamic therapy and thermal laser. Stressed to the patient do not rub eyes.  Patient was advised to check Amsler Grid daily and return immediately if changes are noted. Instructions on using the grid were given to the patient. All patient questions were answered.  Posterior vitreous detachment of both eyes   The nature of posterior vitreous detachment was discussed with the patient as well as its physiology, its age prevalence, and its possible implication regarding retinal breaks and detachment.  An informational brochure was given to the patient.  All the patient's questions were answered.  The patient was asked to return if new or different flashes or floaters develops.   Patient was instructed to contact office immediately if any changes were noticed. I explained to the patient that  vitreous inside the eye is similar to jello inside a bowl. As the jello melts it can start to pull away from the bowl, similarly the vitreous throughout our lives can begin to pull away from the retina. That process is called a posterior vitreous detachment. In some cases, the vitreous can tug hard enough on the retina to form a retinal tear. I discussed with the patient the signs and symptoms of a retinal detachment.  Do not rub the eye.      ICD-10-CM   1. Macular pucker, left eye  H35.372 OCT, Retina - OU - Both Eyes  2. Intermediate stage nonexudative age-related macular degeneration of both eyes  H35.3132 OCT, Retina - OU - Both Eyes  3. Posterior vitreous detachment of both eyes  H43.813   4. Pseudophakia of both eyes  Z96.1     1.  Patient would like to proceed with surgical intervention in an attempt to improve visual functioning of the left eye.   2.  Risks and benefits were reviewed with the patient. Pre Op paperwork  completed today for surgery 01/15/2020.  3.  Ophthalmic Meds Ordered this visit:  No orders of the defined types were placed in this encounter.      Return ,, [facility], for Schedule vitrectomy, membrane peel, left eye, will [facility] [id number].  There are no Patient Instructions on file for this visit.   Explained the diagnoses, plan, and follow up with the patient and they expressed understanding.  Patient expressed understanding of the importance of proper follow up care.   Clent Demark Piotr Christopher M.D. Diseases & Surgery of the Retina and Vitreous [facility]ina [facility] [place] 12/24/19     Abbreviations: M myopia (nearsighted); A astigmatism; H hyperopia (farsighted); P presbyopia; Mrx spectacle prescription;  CTL contact lenses; OD right eye; OS left eye; OU both eyes  XT exotropia; ET esotropia; PEK punctate epithelial keratitis; PEE punctate epithelial erosions; DES dry eye syndrome; MGD meibomian gland dysfunction; ATs artificial tears; PFAT's preservative  free artificial tears; [place] nuclear sclerotic cataract; PSC posterior subcapsular cataract; ERM epi-retinal membrane; PVD posterior vitreous detachment; RD retinal detachment; DM diabetes mellitus; DR diabetic retinopathy; NPDR non-proliferative diabetic retinopathy; PDR proliferative diabetic retinopathy; CSME clinically significant macular edema; DME diabetic macular edema; dbh dot blot hemorrhages; CWS cotton wool spot; POAG primary open angle glaucoma; C/D cup-to-disc ratio; HVF humphrey visual field; GVF goldmann visual field; OCT optical coherence tomography; IOP intraocular pressure; BRVO Branch retinal vein occlusion; CRVO central retinal vein occlusion; CRAO central retinal artery occlusion; BRAO branch retinal artery occlusion; RT retinal tear; SB scleral buckle; PPV pars plana vitrectomy; VH Vitreous hemorrhage; PRP panretinal laser photocoagulation; IVK intravitreal kenalog; VMT vitreomacular traction; MH Macular hole;  NVD neovascularization of the disc; NVE neovascularization elsewhere; AREDS age related eye disease study; ARMD age related macular degeneration; POAG primary open angle glaucoma; EBMD epithelial/anterior basement membrane dystrophy; ACIOL anterior chamber intraocular lens; IOL intraocular lens; PCIOL posterior chamber intraocular lens; Phaco/IOL phacoemulsification with intraocular lens placement; [place] photorefractive keratectomy; LASIK laser assisted in situ keratomileusis; HTN hypertension; DM diabetes mellitus; COPD chronic obstructive pulmonary disease

## 2019-12-24 NOTE — Assessment & Plan Note (Signed)

## 2019-12-24 NOTE — Assessment & Plan Note (Signed)
The nature of macular pucker (epiretinal membrane ERM) was discussed with the patient as well as threshold criteria for vitrectomy surgery. I explained that in rare cases another surgery is needed to actually remove a second wrinkle should it regrow.  Most often, the epiretinal membrane and underlying wrinkled internal limiting membrane are removed with the first surgery, to accomplish the goals.   If the operative eye is Phakic (natural lens still present), cataract surgery is often recommended prior to Vitrectomy. This will enable the retina surgeon to have the best view during surgery and the patient to obtain optimal results in the future. Treatment options were discussed.   OS, will need to consider vitrectomy with membrane peel left eye in order to release the traction and to allow for visual acuity recovery.  I explained to the patient that the age-related macular degeneration will not be changed by this intervention, and it may in fact prevent complete recovery.

## 2019-12-24 NOTE — Assessment & Plan Note (Signed)

## 2019-12-31 DIAGNOSIS — M1711 Unilateral primary osteoarthritis, right knee: Secondary | ICD-10-CM | POA: Diagnosis not present

## 2020-01-02 DIAGNOSIS — M5416 Radiculopathy, lumbar region: Secondary | ICD-10-CM | POA: Diagnosis not present

## 2020-01-06 DIAGNOSIS — F419 Anxiety disorder, unspecified: Secondary | ICD-10-CM | POA: Diagnosis not present

## 2020-01-06 DIAGNOSIS — M1990 Unspecified osteoarthritis, unspecified site: Secondary | ICD-10-CM | POA: Diagnosis not present

## 2020-01-06 DIAGNOSIS — I10 Essential (primary) hypertension: Secondary | ICD-10-CM | POA: Diagnosis not present

## 2020-01-06 DIAGNOSIS — Z79899 Other long term (current) drug therapy: Secondary | ICD-10-CM | POA: Diagnosis not present

## 2020-01-15 ENCOUNTER — Encounter (INDEPENDENT_AMBULATORY_CARE_PROVIDER_SITE_OTHER): Payer: Medicare Other | Admitting: Ophthalmology

## 2020-01-15 DIAGNOSIS — H35372 Puckering of macula, left eye: Secondary | ICD-10-CM | POA: Diagnosis not present

## 2020-01-16 ENCOUNTER — Ambulatory Visit (INDEPENDENT_AMBULATORY_CARE_PROVIDER_SITE_OTHER): Payer: Medicare Other | Admitting: Ophthalmology

## 2020-01-16 ENCOUNTER — Other Ambulatory Visit: Payer: Self-pay

## 2020-01-16 ENCOUNTER — Encounter (INDEPENDENT_AMBULATORY_CARE_PROVIDER_SITE_OTHER): Payer: Self-pay | Admitting: Ophthalmology

## 2020-01-16 DIAGNOSIS — Z9889 Other specified postprocedural states: Secondary | ICD-10-CM

## 2020-01-16 DIAGNOSIS — H35372 Puckering of macula, left eye: Secondary | ICD-10-CM

## 2020-01-16 NOTE — Assessment & Plan Note (Signed)
Postop day #1 status post vitrectomy, epiretinal and internal limiting membrane peel, no intravitreal tamponade required

## 2020-01-16 NOTE — Patient Instructions (Signed)
Patient instructed to use topical medications  Prednisolone acetate 1 drop left eye 4 times daily  Ofloxacin 1 drop left eye 4 times daily  Patient offered the option wearing the hard shell patch nightly to protect the eye.  Patient instructed not to mash or rub the eye  No lifting and bending for 1 week. No water in the eye for 10 days. Do not rub the eye. Wear shield at night for 1-3 days.  Wear your CPAP as normal, if instructed by your doctor.  Continue your topical medications for a total of 3 weeks.  Do not refill your postoperative medications unless instructed.

## 2020-01-16 NOTE — Progress Notes (Signed)
01/16/2020     CHIEF COMPLAINT Patient presents for Post-op Follow-up   HISTORY OF PRESENT ILLNESS: Mackenzie [name] is a 79 y.o. female who presents to the clinic today for:   HPI    Post-op Follow-up    In left eye.          Comments    1 Day POV OS  Pt denies ocular pain. Pt denies nausea or vomiting following sx.       Last edited by Rockie Neighbours, [place] on 01/16/2020  8:07 AM. (History)      Referring physician: Asencion Noble, MD [address] [place],  [place] [address]  HISTORICAL INFORMATION:   Selected notes from the MEDICAL RECORD NUMBER    Lab Results  Component Value Date   HGBA1C 5.8 (H) 01/31/2012     CURRENT MEDICATIONS: No current outpatient medications on file. (Ophthalmic Drugs)   No current facility-administered medications for this visit. (Ophthalmic Drugs)   Current Outpatient Medications (Other)  Medication Sig  . acetaminophen (TYLENOL) 500 MG tablet Take 1,000 mg by mouth every 6 (six) hours as needed for moderate pain or headache.  . ALPRAZolam (XANAX) 0.25 MG tablet Take 0.0625-0.25 mg by mouth 2 (two) times daily as needed for anxiety.   Marland Kitchen amLODipine (NORVASC) 5 MG tablet Take 5 mg by mouth daily.  Marland Kitchen apixaban (ELIQUIS) 2.5 MG TABS tablet Take 1 tablet (2.5 mg total) by mouth daily.  . citalopram (CELEXA) 10 MG tablet Take 10 mg by mouth daily.  . Cyanocobalamin (VITAMIN B-12 IJ) Inject 1 mL as directed every 30 (thirty) days.  Marland Kitchen docusate sodium (COLACE) 100 MG capsule 1 tab 2 times a day while on narcotics.  STOOL SOFTENER  . gabapentin (NEURONTIN) 300 MG capsule Take 1 capsule (300 mg total) by mouth at bedtime. .for nerve pain  . metoprolol succinate (TOPROL-XL) 25 MG 24 hr tablet Take 25 mg by mouth daily.    Marland Kitchen oxyCODONE (OXY IR/ROXICODONE) 5 MG immediate release tablet Take 1 tablet (5 mg total) by mouth every 4 (four) hours as needed (pain).  . polyethylene glycol (MIRALAX / GLYCOLAX) 17 g packet 17grams in 6 oz of something to  drink twice a day until bowel movement.  LAXITIVE.  Restart if two days since last bowel movement  . pravastatin (PRAVACHOL) 40 MG tablet Take 40 mg by mouth once a week.    No current facility-administered medications for this visit. (Other)   Facility-Administered Medications Ordered in Other Visits (Other)  Medication Route  . 0.9 %  sodium chloride infusion Intravenous  . acetaminophen (TYLENOL) tablet 650 mg Oral   Or  . acetaminophen (TYLENOL) suppository 650 mg Rectal      REVIEW OF SYSTEMS:    ALLERGIES Allergies  Allergen Reactions  . Nickel Itching, Swelling and Rash    Possibility...still trying to figure out   . Palladium Chloride Other (See Comments)    Positive patch test   . Statins Other (See Comments)    Atorvastatin, pravastatin, simvastatin->myalgias   . Bacitracin Rash  . Gold Sodium Thiosulfate Rash    PAST MEDICAL HISTORY Past Medical History:  Diagnosis Date  . Anemia, normocytic normochromic 01/2012   hx of  . Anxiety    takes Xanax daily as needed  . Arthritis    knees, hands  . Cataract    right eye  . Depression    takes Citalopram daily  . Dizziness    cardiologist is aware and told  pt it was related to meds  . History of bronchitis    2014  . History of colon polyps    benign  . History of migraine   . Hyperlipidemia    takes Pravastatin daily  . Hypertension    takes Metoprolol and Azor daily  . Joint pain   . Pneumonia    hx of-2015  . Primary localized osteoarthritis of right knee 10/01/2019  . Pseudogout of knee, right 02/22/2017  . Renal insufficiency 02/22/2017  . Rheumatic fever    hx of  . Wheezing on expiration    Past Surgical History:  Procedure Laterality Date  . ABDOMINAL HYSTERECTOMY    . APPENDECTOMY    . CARDIAC CATHETERIZATION     early 2000's  . CARDIAC CATHETERIZATION N/A 03/04/2015   Procedure: Left Heart Cath and Coronary Angiography;  Surgeon: Sherren Mocha, MD;  Location: [place] [facility];   Service: Cardiovascular;  Laterality: N/A;  . CATARACT EXTRACTION Bilateral   . cataract surgery Left   . CHOLECYSTECTOMY    . CHONDROPLASTY Right 02/21/2017   Procedure: CHONDROPLASTY WITH DEBRIDEMENT;  Surgeon: Elsie Saas, MD;  Location: [facility];  Service: Orthopedics;  Laterality: Right;  . COLONOSCOPY  2005   Negative screening study  . COLONOSCOPY N/A 04/04/2014   Procedure: COLONOSCOPY;  Surgeon: Rogene Houston, MD;  [facility];  Service: Endoscopy;  Laterality: N/A;  1225  . EXPLORATORY LAPAROTOMY     x 3  . EYE SURGERY    . IR GENERIC HISTORICAL  10/15/2015   IR RADIOLOGIST EVAL & MGMT 10/15/2015 [facility]  . IR GENERIC HISTORICAL  10/20/2015   IR KYPHO LUMBAR INC FX REDUCE BONE BX UNI/BIL CANNULATION INC/IMAGING 10/20/2015 Luanne Bras, MD [facility]  . IRRIGATION AND DEBRIDEMENT KNEE Right 02/21/2017   Procedure: IRRIGATION AND DEBRIDEMENT KNEE;  Surgeon: Elsie Saas, MD;  Location: [facility];  Service: Orthopedics;  Laterality: Right;  . LUMBAR LAMINECTOMY/DECOMPRESSION MICRODISCECTOMY Left 01/24/2018   Procedure: LEFT LUMBAR FOUR- LUMBAR FIVE  LAMINOTOMY AND MICRODISCECTOMY;  Surgeon: Jovita Gamma, MD;  Location: [facility];  Service: Neurosurgery;  Laterality: Left;  LEFT LUMBAR 4- LUMBAR 5  LAMINOTOMY AND MICRODISCECTOMY  . TOTAL HIP ARTHROPLASTY Right 12/16/2014   Procedure: TOTAL HIP ARTHROPLASTY ANTERIOR APPROACH;  Surgeon: Renette Butters, MD;  Location: [facility];  Service: Orthopedics;  Laterality: Right;  . TOTAL KNEE ARTHROPLASTY Right 10/11/2019   Procedure: TOTAL KNEE ARTHROPLASTY;  Surgeon: Elsie Saas, MD;  [facility];  Service: Orthopedics;  Laterality: Right;    FAMILY HISTORY Family History  Problem Relation Age of Onset  . Heart attack Mother 55       heart problems  . Cancer Mother        uterus  . Pancreatitis Sister        died  age 60  . Colon cancer Maternal Aunt 81  . Colon cancer  Maternal Aunt 78  . Colon cancer Maternal Aunt 76  . Cancer Other        FH  . Diabetes Other        FH  . Hypertension Other        FH  . Diabetes Son   . Diabetes Daughter   . Colon cancer Maternal Uncle 40    SOCIAL HISTORY Social History   Tobacco Use  . Smoking status: Former Smoker    Packs/day: 1.00    Years: 15.00    Pack  years: 15.00  . Smokeless tobacco: Never Used  . Tobacco comment: quit smoking 30+yrs ago  Vaping Use  . Vaping Use: Never used  Substance Use Topics  . Alcohol use: Yes    Alcohol/week: 3.0 standard drinks    Types: 3 Glasses of wine per week    Comment: socially wine  . Drug use: No         OPHTHALMIC EXAM:  Base Eye Exam    Visual Acuity (ETDRS)      Right Left   Dist [place] 20/50 +1 CF @ 5'   Dist ph [place] 20/30 20/100 -1       Tonometry (Tonopen, 8:08 AM)      Right Left   Pressure 18 16       Pupils      Dark Light Shape React APD   Right 3 2 Round Sluggish None   Left 8 8 Round Dilated None       Extraocular Movement      Right Left    Full Full       Neuro/Psych    Oriented x3: Yes   Mood/Affect: Normal       Dilation    Left eye: 1.0% Mydriacyl, 2.5% Phenylephrine @ 8:13 AM        Slit Lamp and Fundus Exam    External Exam      Right Left   External Normal Normal       Slit Lamp Exam      Right Left   Lids/Lashes Normal Normal   Conjunctiva/Sclera White and quiet White and quiet   Cornea Clear Clear   Anterior Chamber Deep and quiet Deep and quiet   Iris Round and reactive Round and reactive   Lens  Centered posterior chamber intraocular lens   Anterior Vitreous Normal Normal       Fundus Exam      Right Left   Posterior Vitreous  Clear, vitrectomized   Disc  Normal   C/D Ratio  0.4   Macula  Soft drusen, 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 H/O vitrectomy  Z98.890   2. Macular pucker, left eye  H35.372     1  Looks great OS  2.  Explained to the patient that visual acuity now has a chance to improve over the next weeks to months  3.  Ophthalmic Meds Ordered this visit:  No orders of the defined types were placed in this encounter.      Return in about 1 week (around 01/23/2020) for dilate, OS, OCT.  Patient Instructions  Patient instructed to use topical medications  Prednisolone acetate 1 drop left eye 4 times daily  Ofloxacin 1 drop left eye 4 times daily  Patient offered the option wearing the hard shell patch nightly to protect the eye.  Patient instructed not to mash or rub the eye  No lifting and bending for 1 week. No water in the eye for 10 days. Do not rub the eye. Wear shield at night for 1-3 days.  Wear your  CPAP as normal, if instructed by your doctor.  Continue your topical medications for a total of 3 weeks.  Do not refill your postoperative medications unless instructed.    Explained the diagnoses, plan, and follow up with the patient and they expressed understanding.  Patient expressed understanding of the importance of proper follow up care.   Clent Demark Azriella Mattia M.D. Diseases & Surgery of the Retina and Vitreous Retina & Diabetic [place] 01/16/20     Abbreviations: M myopia (nearsighted); A astigmatism; H hyperopia (farsighted); P presbyopia; Mrx spectacle prescription;  CTL contact lenses; OD right eye; OS left eye; OU both eyes  XT exotropia; ET esotropia; PEK punctate epithelial keratitis; PEE punctate epithelial erosions; DES dry eye syndrome; MGD meibomian gland dysfunction; ATs artificial tears; PFAT's preservative free artificial tears; [place] nuclear sclerotic  cataract; PSC posterior subcapsular cataract; ERM epi-retinal membrane; PVD posterior vitreous detachment; RD retinal detachment; DM diabetes mellitus; DR diabetic retinopathy; NPDR non-proliferative diabetic retinopathy; PDR proliferative diabetic retinopathy; CSME clinically significant macular edema; DME diabetic macular edema; dbh dot blot hemorrhages; CWS cotton wool spot; POAG primary open angle glaucoma; C/D cup-to-disc ratio; HVF humphrey visual field; GVF goldmann visual field; OCT optical coherence tomography; IOP intraocular pressure; BRVO Branch retinal vein occlusion; CRVO central retinal vein occlusion; CRAO central retinal artery occlusion; BRAO branch retinal artery occlusion; RT retinal tear; SB scleral buckle; PPV pars plana vitrectomy; VH Vitreous hemorrhage; PRP panretinal laser photocoagulation; IVK intravitreal kenalog; VMT vitreomacular traction; MH Macular hole;  NVD neovascularization of the disc; NVE neovascularization elsewhere; AREDS age related eye disease study; ARMD age related macular degeneration; POAG primary open angle glaucoma; EBMD epithelial/anterior basement membrane dystrophy; ACIOL anterior chamber intraocular lens; IOL intraocular lens; PCIOL posterior chamber intraocular lens; Phaco/IOL phacoemulsification with intraocular lens placement; [place] photorefractive keratectomy; LASIK laser assisted in situ keratomileusis; HTN hypertension; DM diabetes mellitus; COPD chronic obstructive pulmonary disease

## 2020-01-18 DIAGNOSIS — Z23 Encounter for immunization: Secondary | ICD-10-CM | POA: Diagnosis not present

## 2020-01-23 ENCOUNTER — Ambulatory Visit (INDEPENDENT_AMBULATORY_CARE_PROVIDER_SITE_OTHER): Payer: Medicare Other | Admitting: Ophthalmology

## 2020-01-23 ENCOUNTER — Other Ambulatory Visit: Payer: Self-pay

## 2020-01-23 ENCOUNTER — Encounter (INDEPENDENT_AMBULATORY_CARE_PROVIDER_SITE_OTHER): Payer: Self-pay | Admitting: Ophthalmology

## 2020-01-23 DIAGNOSIS — I10 Essential (primary) hypertension: Secondary | ICD-10-CM | POA: Diagnosis not present

## 2020-01-23 DIAGNOSIS — E785 Hyperlipidemia, unspecified: Secondary | ICD-10-CM | POA: Diagnosis not present

## 2020-01-23 DIAGNOSIS — H35372 Puckering of macula, left eye: Secondary | ICD-10-CM | POA: Diagnosis not present

## 2020-01-23 DIAGNOSIS — I2510 Atherosclerotic heart disease of native coronary artery without angina pectoris: Secondary | ICD-10-CM | POA: Diagnosis not present

## 2020-01-23 NOTE — Assessment & Plan Note (Signed)
Improved macular thickening 1 week post vitrectomy membrane peel, continue topical medications for the next 2 weeks

## 2020-01-23 NOTE — Patient Instructions (Signed)
Patient instructed to complete her topical medications over the next 2 weeks to the left eye  Patient is asked to discontinue use at the end of 2 weeks should she still have medication remaining  Patient asked not to refill the medication should they be completed prior to 2 weeks

## 2020-01-23 NOTE — Progress Notes (Signed)
01/23/2020     CHIEF COMPLAINT Patient presents for Post-op Follow-up   HISTORY OF PRESENT ILLNESS: Mackenzie [name] is a 79 y.o. female who presents to the clinic today for:   HPI    Post-op Follow-up    In left eye.  Discomfort includes floaters.  Vision is stable.          Comments    1 WK PO OS, Sx 01/15/20   Pt reports stable vision, no pain or pressure, some floaters that come and go OS, some discharge this AM.        Last edited by Nichola Sizer D on 01/23/2020 10:12 AM. (History)      Referring physician: Asencion Noble, MD [address] [place],  [place] [address]  HISTORICAL INFORMATION:   Selected notes from the MEDICAL RECORD NUMBER    Lab Results  Component Value Date   HGBA1C 5.8 (H) 01/31/2012     CURRENT MEDICATIONS: Current Outpatient Medications (Ophthalmic Drugs)  Medication Sig  . ofloxacin (OCUFLOX) 0.3 % ophthalmic solution Place 1 drop into the left eye 4 (four) times daily.  . prednisoLONE acetate (PRED FORTE) 1 % ophthalmic suspension Place 1 drop into the left eye 4 (four) times daily.   No current facility-administered medications for this visit. (Ophthalmic Drugs)   Current Outpatient Medications (Other)  Medication Sig  . acetaminophen (TYLENOL) 500 MG tablet Take 1,000 mg by mouth every 6 (six) hours as needed for moderate pain or headache.  . ALPRAZolam (XANAX) 0.25 MG tablet Take 0.0625-0.25 mg by mouth 2 (two) times daily as needed for anxiety.   Marland Kitchen amLODipine (NORVASC) 5 MG tablet Take 5 mg by mouth daily.  Marland Kitchen apixaban (ELIQUIS) 2.5 MG TABS tablet Take 1 tablet (2.5 mg total) by mouth daily.  . citalopram (CELEXA) 10 MG tablet Take 10 mg by mouth daily.  . Cyanocobalamin (VITAMIN B-12 IJ) Inject 1 mL as directed every 30 (thirty) days.  Marland Kitchen docusate sodium (COLACE) 100 MG capsule 1 tab 2 times a day while on narcotics.  STOOL SOFTENER  . gabapentin (NEURONTIN) 300 MG capsule Take 1 capsule (300 mg total) by mouth at bedtime.  .for nerve pain  . metoprolol succinate (TOPROL-XL) 25 MG 24 hr tablet Take 25 mg by mouth daily.    Marland Kitchen oxyCODONE (OXY IR/ROXICODONE) 5 MG immediate release tablet Take 1 tablet (5 mg total) by mouth every 4 (four) hours as needed (pain).  . polyethylene glycol (MIRALAX / GLYCOLAX) 17 g packet 17grams in 6 oz of something to drink twice a day until bowel movement.  LAXITIVE.  Restart if two days since last bowel movement  . pravastatin (PRAVACHOL) 40 MG tablet Take 40 mg by mouth once a week.    No current facility-administered medications for this visit. (Other)   Facility-Administered Medications Ordered in Other Visits (Other)  Medication Route  . 0.9 %  sodium chloride infusion Intravenous  . acetaminophen (TYLENOL) tablet 650 mg Oral   Or  . acetaminophen (TYLENOL) suppository 650 mg Rectal      REVIEW OF SYSTEMS:    ALLERGIES Allergies  Allergen Reactions  . Nickel Itching, Swelling and Rash    Possibility...still trying to figure out   . Palladium Chloride Other (See Comments)    Positive patch test   . Statins Other (See Comments)    Atorvastatin, pravastatin, simvastatin->myalgias   . Bacitracin Rash  . Gold Sodium Thiosulfate Rash    PAST MEDICAL HISTORY Past Medical History:  Diagnosis Date  . Anemia, normocytic normochromic 01/2012   hx of  . Anxiety    takes Xanax daily as needed  . Arthritis    knees, hands  . Cataract    right eye  . Depression    takes Citalopram daily  . Dizziness    cardiologist is aware and told pt it was related to meds  . History of bronchitis    2014  . History of colon polyps    benign  . History of migraine   . Hyperlipidemia    takes Pravastatin daily  . Hypertension    takes Metoprolol and Azor daily  . Joint pain   . Pneumonia    hx of-2015  . Primary localized osteoarthritis of right knee 10/01/2019  . Pseudogout of knee, right 02/22/2017  . Renal insufficiency 02/22/2017  . Rheumatic fever    hx of  .  Wheezing on expiration    Past Surgical History:  Procedure Laterality Date  . ABDOMINAL HYSTERECTOMY    . APPENDECTOMY    . CARDIAC CATHETERIZATION     early 2000's  . CARDIAC CATHETERIZATION N/A 03/04/2015   Procedure: Left Heart Cath and Coronary Angiography;  Surgeon: Sherren Mocha, MD;  Location: [facility];  Service: Cardiovascular;  Laterality: N/A;  . CATARACT EXTRACTION Bilateral   . cataract surgery Left   . CHOLECYSTECTOMY    . CHONDROPLASTY Right 02/21/2017   Procedure: CHONDROPLASTY WITH DEBRIDEMENT;  Surgeon: Elsie Saas, MD;  Location: [facility];  Service: Orthopedics;  Laterality: Right;  . COLONOSCOPY  2005   Negative screening study  . COLONOSCOPY N/A 04/04/2014   Procedure: COLONOSCOPY;  Surgeon: Rogene Houston, MD;  [facility];  Service: Endoscopy;  Laterality: N/A;  1225  . EXPLORATORY LAPAROTOMY     x 3  . EYE SURGERY    . IR GENERIC HISTORICAL  10/15/2015   IR RADIOLOGIST EVAL & MGMT 10/15/2015 [facility]  . IR GENERIC HISTORICAL  10/20/2015   IR KYPHO LUMBAR INC FX REDUCE BONE BX UNI/BIL CANNULATION INC/IMAGING 10/20/2015 Luanne Bras, MD [facility]  . IRRIGATION AND DEBRIDEMENT KNEE Right 02/21/2017   Procedure: IRRIGATION AND DEBRIDEMENT KNEE;  Surgeon: Elsie Saas, MD;  Location: [facility];  Service: Orthopedics;  Laterality: Right;  . LUMBAR LAMINECTOMY/DECOMPRESSION MICRODISCECTOMY Left 01/24/2018   Procedure: LEFT LUMBAR FOUR- LUMBAR FIVE  LAMINOTOMY AND MICRODISCECTOMY;  Surgeon: Jovita Gamma, MD;  Location: [facility];  Service: Neurosurgery;  Laterality: Left;  LEFT LUMBAR 4- LUMBAR 5  LAMINOTOMY AND MICRODISCECTOMY  . TOTAL HIP ARTHROPLASTY Right 12/16/2014   Procedure: TOTAL HIP ARTHROPLASTY ANTERIOR APPROACH;  Surgeon: Renette Butters, MD;  Location: [facility];  Service: Orthopedics;  Laterality: Right;  . TOTAL KNEE ARTHROPLASTY Right 10/11/2019   Procedure: TOTAL KNEE ARTHROPLASTY;  Surgeon:  Elsie Saas, MD;  [facility];  Service: Orthopedics;  Laterality: Right;    FAMILY HISTORY Family History  Problem Relation Age of Onset  . Heart attack Mother 58       heart problems  . Cancer Mother        uterus  . Pancreatitis Sister        died  age 17  . Colon cancer Maternal Aunt 81  . Colon cancer Maternal Aunt 78  . Colon cancer Maternal Aunt 76  . Cancer Other        FH  . Diabetes Other        FH  .  Hypertension Other        FH  . Diabetes Son   . Diabetes Daughter   . Colon cancer Maternal Uncle 40    SOCIAL HISTORY Social History   Tobacco Use  . Smoking status: Former Smoker    Packs/day: 1.00    Years: 15.00    Pack years: 15.00  . Smokeless tobacco: Never Used  . Tobacco comment: quit smoking 30+yrs ago  Vaping Use  . Vaping Use: Never used  Substance Use Topics  . Alcohol use: Yes    Alcohol/week: 3.0 standard drinks    Types: 3 Glasses of wine per week    Comment: socially wine  . Drug use: No         OPHTHALMIC EXAM:  Base Eye Exam    Visual Acuity (ETDRS)      Right Left   Dist [place] 20/40 -2 20/400   Dist ph  20/30 20/200 -1       Tonometry (Tonopen, 10:17 AM)      Right Left   Pressure 17 27  3  attempts OS       Pupils      Pupils Dark Light Shape React APD   Right PERRL 3 2 Round Brisk None   Left PERRL 3 2 Round Brisk None       Visual Fields (Counting fingers)      Left Right    Full        Extraocular Movement      Right Left    Full Full       Neuro/Psych    Oriented x3: Yes   Mood/Affect: Normal       Dilation    Left eye: 1.0% Mydriacyl, 2.5% Phenylephrine @ 10:19 AM        Slit Lamp and Fundus Exam    External Exam      Right Left   External Normal Normal       Slit Lamp Exam      Right Left   Lids/Lashes Normal Normal   Conjunctiva/Sclera White and quiet White and quiet   Cornea Clear Clear   Anterior Chamber Deep and quiet Deep and quiet   Iris Round and reactive Round and  reactive   Lens  Centered posterior chamber intraocular lens   Anterior Vitreous Normal Normal       Fundus Exam      Right Left   Posterior Vitreous  Clear, vitrectomized   Disc  Normal   C/D Ratio  0.4   Macula  Soft drusen, 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 Central Foveal Thickness: 251. Progression has been stable. Findings include retinal drusen , abnormal foveal contour.   Left Eye Quality was good. Scan locations included subfoveal. Central Foveal Thickness: 385. Progression has improved.   Notes OS with improved retinal thickening status post vitrectomy, internal limiting membrane peel.  1 week previous. Will continue to observe.  Visual acuity left eye may ultimately be limited by elements of age-related macular degeneration dry                ASSESSMENT/PLAN:  Macular pucker, left eye Improved macular thickening 1 week post vitrectomy membrane peel, continue  topical medications for the next 2 weeks      ICD-10-CM   1. Macular pucker, left eye  H35.372 OCT, Retina - OU - Both Eyes    1.  OS much improved we will continue with topical therapy medications as outlined before  2.  3.  Ophthalmic Meds Ordered this visit:  No orders of the defined types were placed in this encounter.      Return in about 7 weeks (around 03/12/2020) for dilate, OS, OCT.  Patient Instructions  Patient instructed to complete her topical medications over the next 2 weeks to the left eye  Patient is asked to discontinue use at the end of 2 weeks should she still have medication remaining  Patient asked not to refill the medication should they be completed prior to 2 weeks    Explained the diagnoses, plan, and follow up with the patient and they  expressed understanding.  Patient expressed understanding of the importance of proper follow up care.   Clent Demark Denya Buckingham M.D. Diseases & Surgery of the Retina and Vitreous Retina & Diabetic [place] 01/23/20     Abbreviations: M myopia (nearsighted); A astigmatism; H hyperopia (farsighted); P presbyopia; Mrx spectacle prescription;  CTL contact lenses; OD right eye; OS left eye; OU both eyes  XT exotropia; ET esotropia; PEK punctate epithelial keratitis; PEE punctate epithelial erosions; DES dry eye syndrome; MGD meibomian gland dysfunction; ATs artificial tears; PFAT's preservative free artificial tears; [place] nuclear sclerotic cataract; PSC posterior subcapsular cataract; ERM epi-retinal membrane; PVD posterior vitreous detachment; RD retinal detachment; DM diabetes mellitus; DR diabetic retinopathy; NPDR non-proliferative diabetic retinopathy; PDR proliferative diabetic retinopathy; CSME clinically significant macular edema; DME diabetic macular edema; dbh dot blot hemorrhages; CWS cotton wool spot; POAG primary open angle glaucoma; C/D cup-to-disc ratio; HVF humphrey visual field; GVF goldmann visual field; OCT optical coherence tomography; IOP intraocular pressure; BRVO Branch retinal vein occlusion; CRVO central retinal vein occlusion; CRAO central retinal artery occlusion; BRAO branch retinal artery occlusion; RT retinal tear; SB scleral buckle; PPV pars plana vitrectomy; VH Vitreous hemorrhage; PRP panretinal laser photocoagulation; IVK intravitreal kenalog; VMT vitreomacular traction; MH Macular hole;  NVD neovascularization of the disc; NVE neovascularization elsewhere; AREDS age related eye disease study; ARMD age related macular degeneration; POAG primary open angle glaucoma; EBMD epithelial/anterior basement membrane dystrophy; ACIOL anterior chamber intraocular lens; IOL intraocular lens; PCIOL posterior chamber intraocular lens; Phaco/IOL phacoemulsification with intraocular lens placement;  [place] photorefractive keratectomy; LASIK laser assisted in situ keratomileusis; HTN hypertension; DM diabetes mellitus; COPD chronic obstructive pulmonary disease

## 2020-03-12 ENCOUNTER — Ambulatory Visit (INDEPENDENT_AMBULATORY_CARE_PROVIDER_SITE_OTHER): Payer: Medicare Other | Admitting: Ophthalmology

## 2020-03-12 ENCOUNTER — Other Ambulatory Visit: Payer: Self-pay

## 2020-03-12 ENCOUNTER — Encounter (INDEPENDENT_AMBULATORY_CARE_PROVIDER_SITE_OTHER): Payer: Self-pay | Admitting: Ophthalmology

## 2020-03-12 DIAGNOSIS — H353132 Nonexudative age-related macular degeneration, bilateral, intermediate dry stage: Secondary | ICD-10-CM | POA: Diagnosis not present

## 2020-03-12 DIAGNOSIS — H35372 Puckering of macula, left eye: Secondary | ICD-10-CM | POA: Diagnosis not present

## 2020-03-12 NOTE — Patient Instructions (Signed)
Patient instructed to notify the office promptly new onset visual acuity declines or distortions 

## 2020-03-12 NOTE — Assessment & Plan Note (Signed)
This condition resolved post vitrectomy 

## 2020-03-12 NOTE — Assessment & Plan Note (Signed)
OD moderate, OS with some degree of geographic atrophy centrally may ultimately limit acuity

## 2020-03-12 NOTE — Progress Notes (Signed)
03/12/2020     CHIEF COMPLAINT Patient presents for Post-op Follow-up (7 WK PO VITRECTOMY MEMBRANE PEEL FOR EPIRETINAL MEMBRANE OS, SX 01/15/20/)   HISTORY OF PRESENT ILLNESS: Mackenzie [name] is a 80 y.o. female who presents to the clinic today for:   HPI    Post-op Follow-up    In left eye.  Discomfort includes floaters.  Vision is improved. Additional comments: 7 WK PO VITRECTOMY MEMBRANE PEEL FOR EPIRETINAL MEMBRANE OS, SX 01/15/20        Last edited by Hurman Horn, MD on 03/12/2020  9:28 AM. (History)      Referring physician: Asencion Noble, MD [address] [place],  [place] [address]  HISTORICAL INFORMATION:   Selected notes from the MEDICAL RECORD NUMBER    Lab Results  Component Value Date   HGBA1C 5.8 (H) 01/31/2012     CURRENT MEDICATIONS: No current outpatient medications on file. (Ophthalmic Drugs)   No current facility-administered medications for this visit. (Ophthalmic Drugs)   Current Outpatient Medications (Other)  Medication Sig  . acetaminophen (TYLENOL) 500 MG tablet Take 1,000 mg by mouth every 6 (six) hours as needed for moderate pain or headache.  . ALPRAZolam (XANAX) 0.25 MG tablet Take 0.0625-0.25 mg by mouth 2 (two) times daily as needed for anxiety.   Marland Kitchen amLODipine (NORVASC) 5 MG tablet Take 5 mg by mouth daily.  Marland Kitchen apixaban (ELIQUIS) 2.5 MG TABS tablet Take 1 tablet (2.5 mg total) by mouth daily.  . citalopram (CELEXA) 10 MG tablet Take 10 mg by mouth daily.  . Cyanocobalamin (VITAMIN B-12 IJ) Inject 1 mL as directed every 30 (thirty) days.  Marland Kitchen docusate sodium (COLACE) 100 MG capsule 1 tab 2 times a day while on narcotics.  STOOL SOFTENER  . gabapentin (NEURONTIN) 300 MG capsule Take 1 capsule (300 mg total) by mouth at bedtime. .for nerve pain  . metoprolol succinate (TOPROL-XL) 25 MG 24 hr tablet Take 25 mg by mouth daily.    Marland Kitchen oxyCODONE (OXY IR/ROXICODONE) 5 MG immediate release tablet Take 1 tablet (5 mg total) by mouth every 4 (four)  hours as needed (pain).  . polyethylene glycol (MIRALAX / GLYCOLAX) 17 g packet 17grams in 6 oz of something to drink twice a day until bowel movement.  LAXITIVE.  Restart if two days since last bowel movement  . pravastatin (PRAVACHOL) 40 MG tablet Take 40 mg by mouth once a week.    No current facility-administered medications for this visit. (Other)   Facility-Administered Medications Ordered in Other Visits (Other)  Medication Route  . 0.9 %  sodium chloride infusion Intravenous  . acetaminophen (TYLENOL) tablet 650 mg Oral   Or  . acetaminophen (TYLENOL) suppository 650 mg Rectal      REVIEW OF SYSTEMS:    ALLERGIES Allergies  Allergen Reactions  . Nickel Itching, Swelling and Rash    Possibility...still trying to figure out   . Palladium Chloride Other (See Comments)    Positive patch test   . Statins Other (See Comments)    Atorvastatin, pravastatin, simvastatin->myalgias   . Bacitracin Rash  . Gold Sodium Thiosulfate Rash    PAST MEDICAL HISTORY Past Medical History:  Diagnosis Date  . Anemia, normocytic normochromic 01/2012   hx of  . Anxiety    takes Xanax daily as needed  . Arthritis    knees, hands  . Cataract    right eye  . Depression    takes Citalopram daily  . Dizziness  cardiologist is aware and told pt it was related to meds  . History of bronchitis    2014  . History of colon polyps    benign  . History of migraine   . Hyperlipidemia    takes Pravastatin daily  . Hypertension    takes Metoprolol and Azor daily  . Joint pain   . Pneumonia    hx of-2015  . Primary localized osteoarthritis of right knee 10/01/2019  . Pseudogout of knee, right 02/22/2017  . Renal insufficiency 02/22/2017  . Rheumatic fever    hx of  . Wheezing on expiration    Past Surgical History:  Procedure Laterality Date  . ABDOMINAL HYSTERECTOMY    . APPENDECTOMY    . CARDIAC CATHETERIZATION     early 2000's  . CARDIAC CATHETERIZATION N/A 03/04/2015    Procedure: Left Heart Cath and Coronary Angiography;  Surgeon: Sherren Mocha, MD;  Location: [facility];  Service: Cardiovascular;  Laterality: N/A;  . CATARACT EXTRACTION Bilateral   . cataract surgery Left   . CHOLECYSTECTOMY    . CHONDROPLASTY Right 02/21/2017   Procedure: CHONDROPLASTY WITH DEBRIDEMENT;  Surgeon: Elsie Saas, MD;  Location: [facility];  Service: Orthopedics;  Laterality: Right;  . COLONOSCOPY  2005   Negative screening study  . COLONOSCOPY N/A 04/04/2014   Procedure: COLONOSCOPY;  Surgeon: Rogene Houston, MD;  Location: [facility];  Service: Endoscopy;  Laterality: N/A;  1225  . EXPLORATORY LAPAROTOMY     x 3  . EYE SURGERY    . IR GENERIC HISTORICAL  10/15/2015   IR RADIOLOGIST EVAL & MGMT 10/15/2015 [facility]  . IR GENERIC HISTORICAL  10/20/2015   IR KYPHO LUMBAR INC FX REDUCE BONE BX UNI/BIL CANNULATION INC/IMAGING 10/20/2015 Luanne Bras, MD [facility]  . IRRIGATION AND DEBRIDEMENT KNEE Right 02/21/2017   Procedure: IRRIGATION AND DEBRIDEMENT KNEE;  Surgeon: Elsie Saas, MD;  Location: [facility];  Service: Orthopedics;  Laterality: Right;  . LUMBAR LAMINECTOMY/DECOMPRESSION MICRODISCECTOMY Left 01/24/2018   Procedure: LEFT LUMBAR FOUR- LUMBAR FIVE  LAMINOTOMY AND MICRODISCECTOMY;  Surgeon: Jovita Gamma, MD;  Location: [facility];  Service: Neurosurgery;  Laterality: Left;  LEFT LUMBAR 4- LUMBAR 5  LAMINOTOMY AND MICRODISCECTOMY  . TOTAL HIP ARTHROPLASTY Right 12/16/2014   Procedure: TOTAL HIP ARTHROPLASTY ANTERIOR APPROACH;  Surgeon: Renette Butters, MD;  Location: [facility];  Service: Orthopedics;  Laterality: Right;  . TOTAL KNEE ARTHROPLASTY Right 10/11/2019   Procedure: TOTAL KNEE ARTHROPLASTY;  Surgeon: Elsie Saas, MD;  Location: [facility];  Service: Orthopedics;  Laterality: Right;    FAMILY HISTORY Family History  Problem Relation Age of Onset  . Heart attack Mother 89       heart problems  . Cancer Mother         uterus  . Pancreatitis Sister        died  age 67  . Colon cancer Maternal Aunt 81  . Colon cancer Maternal Aunt 78  . Colon cancer Maternal Aunt 76  . Cancer Other        FH  . Diabetes Other        FH  . Hypertension Other        FH  . Diabetes Son   . Diabetes Daughter   . Colon cancer Maternal Uncle 40    SOCIAL HISTORY Social History   Tobacco Use  . Smoking status: Former Smoker    Packs/day: 1.00    Years:  15.00    Pack years: 15.00  . Smokeless tobacco: Never Used  . Tobacco comment: quit smoking 30+yrs ago  Vaping Use  . Vaping Use: Never used  Substance Use Topics  . Alcohol use: Yes    Alcohol/week: 3.0 standard drinks    Types: 3 Glasses of wine per week    Comment: socially wine  . Drug use: No         OPHTHALMIC EXAM:  Base Eye Exam    Visual Acuity (ETDRS)      Right Left   Dist [place] 20/60 +2 CF at 6'   Dist ph [place] 20/30 +1 20/100 +1       Tonometry (Tonopen, 8:39 AM)      Right Left   Pressure 14 14       Pupils      Pupils Dark Light Shape React APD   Right PERRL 3 2 Round Brisk None   Left PERRL 3 2 Round Brisk None       Visual Fields (Counting fingers)      Left Right    Full    Restrictions  Partial outer superior nasal deficiency       Extraocular Movement      Right Left    Full Full       Neuro/Psych    Oriented x3: Yes   Mood/Affect: Normal       Dilation    Left eye: 1.0% Mydriacyl, 2.5% Phenylephrine @ 8:39 AM        Slit Lamp and Fundus Exam    External Exam      Right Left   External Normal Normal       Slit Lamp Exam      Right Left   Lids/Lashes Normal Normal   Conjunctiva/Sclera White and quiet White and quiet   Cornea Clear Clear   Anterior Chamber Deep and quiet Deep and quiet   Iris Round and reactive Round and reactive   Lens Centered posterior chamber intraocular lens Centered posterior chamber intraocular lens   Anterior Vitreous Normal Normal       Fundus Exam      Right Left    Posterior Vitreous  Clear, vitrectomized   Disc  Normal   C/D Ratio  0.4   Macula  Soft drusen, Intermediate age related macular degeneration,, no topographic distortion remains, Retinal pigment epithelial atrophy in a foveal location which may limit acuity, Retinal pigment epithelial mottling, Hard drusen   Vessels  Normal   Periphery  Normal          IMAGING AND PROCEDURES  Imaging and Procedures for 03/12/20  OCT, Retina - OU - Both Eyes       Right Eye Quality was good. Scan locations included subfoveal. Central Foveal Thickness: 249. Findings include retinal drusen , abnormal foveal contour, no SRF, subretinal hyper-reflective material.   Left Eye Scan locations included subfoveal. Central Foveal Thickness: 354. Progression has improved. Findings include no SRF, abnormal foveal contour, retinal drusen , subretinal hyper-reflective material.   Notes OS vastly improved macular thickening post vitrectomy membrane peel for severe epiretinal membrane.  Outer retinal atrophy, drusen deposits may still limits acuity long-term OS.  OD with intermediate ARMD, no signs of CNVM                ASSESSMENT/PLAN:  Intermediate stage nonexudative age-related macular degeneration of both eyes OD moderate, OS with some degree of geographic atrophy centrally may ultimately limit acuity  Macular pucker, left eye This condition resolved post vitrectomy      ICD-10-CM   1. Macular pucker, left eye  H35.372 OCT, Retina - OU - Both Eyes  2. Intermediate stage nonexudative age-related macular degeneration of both eyes  H35.3132     1.  OS, improved acuity (via pinhole) now to 20/100 post vitrectomy (preoperative 20/400) and membrane peel 02/04/2020.  Limitations of acuity may remain however from underlying age-related macular degeneration with atrophy subfoveal  2.  Patient should return to Dr. Rutherford Guys for consideration of refractive assistance OS  3.  Dilate exam here in 6  months to follow the age-related macular degeneration  Ophthalmic Meds Ordered this visit:  No orders of the defined types were placed in this encounter.      Return in about 6 months (around 09/09/2020) for DILATE OU, OCT.  Patient Instructions  Patient instructed to notify the office promptly new onset visual acuity declines or distortions    Explained the diagnoses, plan, and follow up with the patient and they expressed understanding.  Patient expressed understanding of the importance of proper follow up care.   Clent Demark Hasel Janish M.D. Diseases & Surgery of the Retina and Vitreous [facility] [place] 03/12/20     Abbreviations: M myopia (nearsighted); A astigmatism; H hyperopia (farsighted); P presbyopia; Mrx spectacle prescription;  CTL contact lenses; OD right eye; OS left eye; OU both eyes  XT exotropia; ET esotropia; PEK punctate epithelial keratitis; PEE punctate epithelial erosions; DES dry eye syndrome; MGD meibomian gland dysfunction; ATs artificial tears; PFAT's preservative free artificial tears; [place] nuclear sclerotic cataract; PSC posterior subcapsular cataract; ERM epi-retinal membrane; PVD posterior vitreous detachment; RD retinal detachment; DM diabetes mellitus; DR diabetic retinopathy; NPDR non-proliferative diabetic retinopathy; PDR proliferative diabetic retinopathy; CSME clinically significant macular edema; DME diabetic macular edema; dbh dot blot hemorrhages; CWS cotton wool spot; POAG primary open angle glaucoma; C/D cup-to-disc ratio; HVF humphrey visual field; GVF goldmann visual field; OCT optical coherence tomography; IOP intraocular pressure; BRVO Branch retinal vein occlusion; CRVO central retinal vein occlusion; CRAO central retinal artery occlusion; BRAO branch retinal artery occlusion; RT retinal tear; SB scleral buckle; PPV pars plana vitrectomy; VH Vitreous hemorrhage; PRP panretinal laser photocoagulation; IVK intravitreal kenalog; VMT vitreomacular  traction; MH Macular hole;  NVD neovascularization of the disc; NVE neovascularization elsewhere; AREDS age related eye disease study; ARMD age related macular degeneration; POAG primary open angle glaucoma; EBMD epithelial/anterior basement membrane dystrophy; ACIOL anterior chamber intraocular lens; IOL intraocular lens; PCIOL posterior chamber intraocular lens; Phaco/IOL phacoemulsification with intraocular lens placement; [place] photorefractive keratectomy; LASIK laser assisted in situ keratomileusis; HTN hypertension; DM diabetes mellitus; COPD chronic obstructive pulmonary disease

## 2020-03-27 ENCOUNTER — Ambulatory Visit (HOSPITAL_COMMUNITY)
Admission: RE | Admit: 2020-03-27 | Discharge: 2020-03-27 | Disposition: A | Discharge status: Home / Self Care | Payer: Medicare Other | Source: Ambulatory Visit | Attending: Internal Medicine | Admitting: Internal Medicine

## 2020-03-27 ENCOUNTER — Other Ambulatory Visit (HOSPITAL_COMMUNITY): Payer: Self-pay | Admitting: Internal Medicine

## 2020-03-27 ENCOUNTER — Other Ambulatory Visit: Payer: Self-pay

## 2020-03-27 DIAGNOSIS — R0789 Other chest pain: Secondary | ICD-10-CM | POA: Diagnosis not present

## 2020-03-31 DIAGNOSIS — M1711 Unilateral primary osteoarthritis, right knee: Secondary | ICD-10-CM | POA: Diagnosis not present

## 2020-04-23 DIAGNOSIS — M816 Localized osteoporosis [Lequesne]: Secondary | ICD-10-CM | POA: Diagnosis not present

## 2020-04-23 DIAGNOSIS — I10 Essential (primary) hypertension: Secondary | ICD-10-CM | POA: Diagnosis not present

## 2020-04-23 DIAGNOSIS — F419 Anxiety disorder, unspecified: Secondary | ICD-10-CM | POA: Diagnosis not present

## 2020-06-30 DIAGNOSIS — R5383 Other fatigue: Secondary | ICD-10-CM | POA: Diagnosis not present

## 2020-06-30 DIAGNOSIS — R202 Paresthesia of skin: Secondary | ICD-10-CM | POA: Diagnosis not present

## 2020-07-21 ENCOUNTER — Other Ambulatory Visit (HOSPITAL_COMMUNITY): Payer: Self-pay | Admitting: Internal Medicine

## 2020-07-21 DIAGNOSIS — N6310 Unspecified lump in the right breast, unspecified quadrant: Secondary | ICD-10-CM | POA: Diagnosis not present

## 2020-07-21 DIAGNOSIS — I10 Essential (primary) hypertension: Secondary | ICD-10-CM | POA: Diagnosis not present

## 2020-07-21 DIAGNOSIS — R928 Other abnormal and inconclusive findings on diagnostic imaging of breast: Secondary | ICD-10-CM

## 2020-07-21 DIAGNOSIS — I700 Atherosclerosis of aorta: Secondary | ICD-10-CM | POA: Diagnosis not present

## 2020-07-21 DIAGNOSIS — N63 Unspecified lump in unspecified breast: Secondary | ICD-10-CM

## 2020-08-04 ENCOUNTER — Ambulatory Visit (HOSPITAL_COMMUNITY)
Admission: RE | Admit: 2020-08-04 | Discharge: 2020-08-04 | Disposition: A | Discharge status: Home / Self Care | Payer: Medicare Other | Source: Ambulatory Visit | Attending: Internal Medicine | Admitting: Internal Medicine

## 2020-08-04 DIAGNOSIS — N6310 Unspecified lump in the right breast, unspecified quadrant: Secondary | ICD-10-CM

## 2020-08-04 DIAGNOSIS — R928 Other abnormal and inconclusive findings on diagnostic imaging of breast: Secondary | ICD-10-CM

## 2020-08-04 DIAGNOSIS — R922 Inconclusive mammogram: Secondary | ICD-10-CM | POA: Diagnosis not present

## 2020-08-18 ENCOUNTER — Ambulatory Visit (HOSPITAL_COMMUNITY): Payer: Medicare Other

## 2020-08-18 ENCOUNTER — Encounter (HOSPITAL_COMMUNITY): Payer: Medicare Other

## 2020-09-10 ENCOUNTER — Encounter (INDEPENDENT_AMBULATORY_CARE_PROVIDER_SITE_OTHER): Payer: Medicare Other | Admitting: Ophthalmology

## 2020-09-15 DIAGNOSIS — Z96651 Presence of right artificial knee joint: Secondary | ICD-10-CM | POA: Diagnosis not present

## 2020-09-15 DIAGNOSIS — M1712 Unilateral primary osteoarthritis, left knee: Secondary | ICD-10-CM | POA: Diagnosis not present

## 2020-10-05 ENCOUNTER — Encounter (INDEPENDENT_AMBULATORY_CARE_PROVIDER_SITE_OTHER): Payer: Self-pay | Admitting: Ophthalmology

## 2020-10-05 ENCOUNTER — Other Ambulatory Visit: Payer: Self-pay

## 2020-10-05 ENCOUNTER — Ambulatory Visit (INDEPENDENT_AMBULATORY_CARE_PROVIDER_SITE_OTHER): Payer: Medicare Other | Admitting: Ophthalmology

## 2020-10-05 DIAGNOSIS — H353132 Nonexudative age-related macular degeneration, bilateral, intermediate dry stage: Secondary | ICD-10-CM

## 2020-10-05 DIAGNOSIS — H43811 Vitreous degeneration, right eye: Secondary | ICD-10-CM

## 2020-10-05 DIAGNOSIS — H35372 Puckering of macula, left eye: Secondary | ICD-10-CM | POA: Diagnosis not present

## 2020-10-05 NOTE — Progress Notes (Signed)
10/05/2020     CHIEF COMPLAINT Patient presents for  Chief Complaint  Patient presents with   Retina Follow Up      HISTORY OF PRESENT ILLNESS: Mackenzie [name] is a 80 y.o. female who presents to the clinic today for:   HPI     Retina Follow Up           Diagnosis: Dry AMD   Laterality: both eyes   Onset: 6 months ago   Severity: mild   Duration: 6 months   Course: stable         Comments   6 mos fu ou oct Patient states vision is stable and unchanged since last visit. Denies any new floaters or FOL.         Last edited by Laurin Coder, COA on 10/05/2020  9:34 AM.      Referring physician: Asencion Noble, MD [address] [place],  [place] [address]  HISTORICAL INFORMATION:   Selected notes from the MEDICAL RECORD NUMBER    Lab Results  Component Value Date   HGBA1C 5.8 (H) 01/31/2012     CURRENT MEDICATIONS: No current outpatient medications on file. (Ophthalmic Drugs)   No current facility-administered medications for this visit. (Ophthalmic Drugs)   Current Outpatient Medications (Other)  Medication Sig   acetaminophen (TYLENOL) 500 MG tablet Take 1,000 mg by mouth every 6 (six) hours as needed for moderate pain or headache.   ALPRAZolam (XANAX) 0.25 MG tablet Take 0.0625-0.25 mg by mouth 2 (two) times daily as needed for anxiety.    amLODipine (NORVASC) 5 MG tablet Take 5 mg by mouth daily.   apixaban (ELIQUIS) 2.5 MG TABS tablet Take 1 tablet (2.5 mg total) by mouth daily.   citalopram (CELEXA) 10 MG tablet Take 10 mg by mouth daily.   Cyanocobalamin (VITAMIN B-12 IJ) Inject 1 mL as directed every 30 (thirty) days.   docusate sodium (COLACE) 100 MG capsule 1 tab 2 times a day while on narcotics.  STOOL SOFTENER   gabapentin (NEURONTIN) 300 MG capsule Take 1 capsule (300 mg total) by mouth at bedtime. .for nerve pain   metoprolol succinate (TOPROL-XL) 25 MG 24 hr tablet Take 25 mg by mouth daily.     oxyCODONE (OXY IR/ROXICODONE) 5 MG  immediate release tablet Take 1 tablet (5 mg total) by mouth every 4 (four) hours as needed (pain).   polyethylene glycol (MIRALAX / GLYCOLAX) 17 g packet 17grams in 6 oz of something to drink twice a day until bowel movement.  LAXITIVE.  Restart if two days since last bowel movement   pravastatin (PRAVACHOL) 40 MG tablet Take 40 mg by mouth once a week.    No current facility-administered medications for this visit. (Other)   Facility-Administered Medications Ordered in Other Visits (Other)  Medication Route   0.9 %  sodium chloride infusion Intravenous   acetaminophen (TYLENOL) tablet 650 mg Oral   Or   acetaminophen (TYLENOL) suppository 650 mg Rectal      REVIEW OF SYSTEMS:    ALLERGIES Allergies  Allergen Reactions   Nickel Itching, Swelling and Rash    Possibility...still trying to figure out    Palladium Chloride Other (See Comments)    Positive patch test    Statins Other (See Comments)    Atorvastatin, pravastatin, simvastatin->myalgias    Bacitracin Rash   Gold Sodium Thiosulfate Rash    PAST MEDICAL HISTORY Past Medical History:  Diagnosis Date   Anemia, normocytic normochromic 01/2012  hx of   Anxiety    takes Xanax daily as needed   Arthritis    knees, hands   Cataract    right eye   Depression    takes Citalopram daily   Dizziness    cardiologist is aware and told pt it was related to meds   History of bronchitis    2014   History of colon polyps    benign   History of migraine    Hyperlipidemia    takes Pravastatin daily   Hypertension    takes Metoprolol and [place] daily   Joint pain    Pneumonia    hx of-2015   Primary localized osteoarthritis of right knee 10/01/2019   Pseudogout of knee, right 02/22/2017   Renal insufficiency 02/22/2017   Rheumatic fever    hx of   Wheezing on expiration    Past Surgical History:  Procedure Laterality Date   ABDOMINAL HYSTERECTOMY     APPENDECTOMY     CARDIAC CATHETERIZATION     early 2000's    CARDIAC CATHETERIZATION N/A 03/04/2015   Procedure: Left Heart Cath and Coronary Angiography;  Surgeon: Sherren Mocha, MD;  Location: [facility];  Service: Cardiovascular;  Laterality: N/A;   CATARACT EXTRACTION Bilateral    cataract surgery Left    CHOLECYSTECTOMY     CHONDROPLASTY Right 02/21/2017   Procedure: CHONDROPLASTY WITH DEBRIDEMENT;  Surgeon: Elsie Saas, MD;  Location: [facility];  Service: Orthopedics;  Laterality: Right;   COLONOSCOPY  2005   Negative screening study   COLONOSCOPY N/A 04/04/2014   Procedure: COLONOSCOPY;  Surgeon: Rogene Houston, MD;  Location: [facility];  Service: Endoscopy;  Laterality: N/A;  1225   EXPLORATORY LAPAROTOMY     x 3   EYE SURGERY     IR GENERIC HISTORICAL  10/15/2015   IR RADIOLOGIST EVAL & MGMT 10/15/2015 [facility]   IR GENERIC HISTORICAL  10/20/2015   IR KYPHO LUMBAR INC FX REDUCE BONE BX UNI/BIL CANNULATION INC/IMAGING 10/20/2015 Luanne Bras, MD [facility]   IRRIGATION AND DEBRIDEMENT KNEE Right 02/21/2017   Procedure: IRRIGATION AND DEBRIDEMENT KNEE;  Surgeon: Elsie Saas, MD;  Location: [facility];  Service: Orthopedics;  Laterality: Right;   LUMBAR LAMINECTOMY/DECOMPRESSION MICRODISCECTOMY Left 01/24/2018   Procedure: LEFT LUMBAR FOUR- LUMBAR FIVE  LAMINOTOMY AND MICRODISCECTOMY;  Surgeon: Jovita Gamma, MD;  Location: [facility];  Service: Neurosurgery;  Laterality: Left;  LEFT LUMBAR 4- LUMBAR 5  LAMINOTOMY AND MICRODISCECTOMY   TOTAL HIP ARTHROPLASTY Right 12/16/2014   Procedure: TOTAL HIP ARTHROPLASTY ANTERIOR APPROACH;  Surgeon: Renette Butters, MD;  Location: [facility];  Service: Orthopedics;  Laterality: Right;   TOTAL KNEE ARTHROPLASTY Right 10/11/2019   Procedure: TOTAL KNEE ARTHROPLASTY;  Surgeon: Elsie Saas, MD;  Location: [facility];  Service: Orthopedics;  Laterality: Right;    FAMILY HISTORY Family History  Problem Relation Age of Onset   Heart attack Mother 69       heart  problems   Cancer Mother        uterus   Pancreatitis Sister        died  age 63   Colon cancer Maternal Aunt 23   Colon cancer Maternal Aunt 78   Colon cancer Maternal Aunt 76   Cancer Other        FH   Diabetes Other        FH   Hypertension Other  FH   Diabetes Son    Diabetes Daughter    Colon cancer Maternal Uncle 28    SOCIAL HISTORY Social History   Tobacco Use   Smoking status: Former    Packs/day: 1.00    Years: 15.00    Pack years: 15.00    Types: Cigarettes   Smokeless tobacco: Never   Tobacco comments:    quit smoking 30+yrs ago  Vaping Use   Vaping Use: Never used  Substance Use Topics   Alcohol use: Yes    Alcohol/week: 3.0 standard drinks    Types: 3 Glasses of wine per week    Comment: socially wine   Drug use: No         OPHTHALMIC EXAM:  Base Eye Exam     Visual Acuity (ETDRS)       Right Left   Dist [place] 20/60 -2 CF at 5'   Dist ph [place] 20/40 -1 20/100 -1         Tonometry (Tonopen, 9:40 AM)       Right Left   Pressure 20 23         Pupils       Pupils Dark Light Shape React APD   Right PERRL 3 2 Round Brisk None   Left PERRL 3 2 Round Brisk None         Visual Fields (Counting fingers)       Left Right    Full Full         Extraocular Movement       Right Left    Full Full         Neuro/Psych     Oriented x3: Yes   Mood/Affect: Normal         Dilation     Both eyes: 1.0% Mydriacyl, 2.5% Phenylephrine @ 9:40 AM           Slit Lamp and Fundus Exam     External Exam       Right Left   External Normal Normal         Slit Lamp Exam       Right Left   Lids/Lashes Normal Normal   Conjunctiva/Sclera White and quiet White and quiet   Cornea Clear Clear   Anterior Chamber Deep and quiet Deep and quiet   Iris Round and reactive Round and reactive   Lens Centered posterior chamber intraocular lens Centered posterior chamber intraocular lens   Anterior Vitreous Normal Normal          Fundus Exam       Right Left   Posterior Vitreous Posterior vitreous detachment Clear, vitrectomized   Disc Normal Normal   C/D Ratio 0.4 0.4   Macula Soft drusen, Intermediate age related macular degeneration Soft drusen, Intermediate age related macular degeneration,, no topographic distortion remains, Retinal pigment epithelial atrophy in a foveal location which may limit acuity, Retinal pigment epithelial mottling, Hard drusen   Vessels Normal Normal   Periphery Normal Normal            IMAGING AND PROCEDURES  Imaging and Procedures for 10/05/20  OCT, Retina - OU - Both Eyes       Right Eye Quality was good. Scan locations included subfoveal. Central Foveal Thickness: 248. Findings include retinal drusen , abnormal foveal contour, no SRF, subretinal hyper-reflective material.   Left Eye Scan locations included subfoveal. Central Foveal Thickness: 352. Progression has improved. Findings include no SRF, abnormal foveal contour, retinal drusen ,  subretinal hyper-reflective material.   Notes OS vastly improved macular thickening post vitrectomy membrane peel for severe epiretinal membrane.   Outer retinal atrophy, drusen deposits may still limits acuity long-term OS.  OD with intermediate ARMD, no signs of CNVM             ASSESSMENT/PLAN:  Intermediate stage nonexudative age-related macular degeneration of both eyes Patient doing very well with no signs of CNVM, I have encouraged patient to use oral vitamin supplementation to minimize the risk of progression to wet AMD  AREDS 2 formula any brand that she toleratesThe nature of age--related macular degeneration was discussed with the patient as well as the distinction between dry and wet types. Checking an Amsler Grid daily with advice to return immediately should a distortion develop, was given to the patient. The patient 's smoking status now and in the past was determined and advice based on the AREDS study was  provided regarding the consumption of antioxidant supplements. AREDS 2 vitamin formulation was recommended. Consumption of dark leafy vegetables and fresh fruits of various colors was recommended. Treatment modalities for wet macular degeneration particularly the use of intravitreal injections of anti-blood vessel growth factors was discussed with the patient. Avastin, Lucentis, and Eylea are the available options. On occasion, therapy includes the use of photodynamic therapy and thermal laser. Stressed to the patient do not rub eyes.  Patient was advised to check Amsler Grid daily and return immediately if changes are noted. Instructions on using the grid were given to the patient. All patient questions were answered.  Macular pucker, left eye Vastly improved left eye macular topography since vitrectomy surgery completed December 2021  Posterior vitreous detachment of right eye  The nature of posterior vitreous detachment was discussed with the patient as well as its physiology, its age prevalence, and its possible implication regarding retinal breaks and detachment.  An informational brochure was offered to the patient.  All the patient's questions were answered.  The patient was asked to return if new or different flashes or floaters develops.   Patient was instructed to contact office immediately if any new changes were noticed. I explained to the patient that vitreous inside the eye is similar to jello inside a bowl. As the jello melts it can start to pull away from the bowl, similarly the vitreous throughout our lives can begin to pull away from the retina. That process is called a posterior vitreous detachment. In some cases, the vitreous can tug hard enough on the retina to form a retinal tear. I discussed with the patient the signs and symptoms of a retinal detachment.  Do not rub the eye.       ICD-10-CM   1. Intermediate stage nonexudative age-related macular degeneration of both eyes   H35.3132 OCT, Retina - OU - Both Eyes    2. Macular pucker, left eye  H35.372     3. Posterior vitreous detachment of right eye  H43.811       1.  No signs of active CNVM formation OU.  Provide for oral supplementation vitamin samples so that patient can see the vitamins that should be used to slow the progression to wet AMD  2.  3.  Ophthalmic Meds Ordered this visit:  No orders of the defined types were placed in this encounter.      Return in about 1 year (around 10/05/2021) for DILATE OU, COLOR FP, OCT.  There are no Patient Instructions on file for this visit.   Explained  the diagnoses, plan, and follow up with the patient and they expressed understanding.  Patient expressed understanding of the importance of proper follow up care.   Clent Demark Hart Haas M.D. Diseases & Surgery of the Retina and Vitreous Retina & Diabetic [place] 10/05/20     Abbreviations: M myopia (nearsighted); A astigmatism; H hyperopia (farsighted); P presbyopia; Mrx spectacle prescription;  CTL contact lenses; OD right eye; OS left eye; OU both eyes  XT exotropia; ET esotropia; PEK punctate epithelial keratitis; PEE punctate epithelial erosions; DES dry eye syndrome; MGD meibomian gland dysfunction; ATs artificial tears; PFAT's preservative free artificial tears; [place] nuclear sclerotic cataract; PSC posterior subcapsular cataract; ERM epi-retinal membrane; PVD posterior vitreous detachment; RD retinal detachment; DM diabetes mellitus; DR diabetic retinopathy; NPDR non-proliferative diabetic retinopathy; PDR proliferative diabetic retinopathy; CSME clinically significant macular edema; DME diabetic macular edema; dbh dot blot hemorrhages; CWS cotton wool spot; POAG primary open angle glaucoma; C/D cup-to-disc ratio; HVF humphrey visual field; GVF goldmann visual field; OCT optical coherence tomography; IOP intraocular pressure; BRVO Branch retinal vein occlusion; CRVO central retinal vein occlusion; CRAO  central retinal artery occlusion; BRAO branch retinal artery occlusion; RT retinal tear; SB scleral buckle; PPV pars plana vitrectomy; VH Vitreous hemorrhage; PRP panretinal laser photocoagulation; IVK intravitreal kenalog; VMT vitreomacular traction; MH Macular hole;  NVD neovascularization of the disc; NVE neovascularization elsewhere; AREDS age related eye disease study; ARMD age related macular degeneration; POAG primary open angle glaucoma; EBMD epithelial/anterior basement membrane dystrophy; ACIOL anterior chamber intraocular lens; IOL intraocular lens; PCIOL posterior chamber intraocular lens; Phaco/IOL phacoemulsification with intraocular lens placement; [place] photorefractive keratectomy; LASIK laser assisted in situ keratomileusis; HTN hypertension; DM diabetes mellitus; COPD chronic obstructive pulmonary disease

## 2020-10-05 NOTE — Assessment & Plan Note (Signed)
Vastly improved left eye macular topography since vitrectomy surgery completed December 2021

## 2020-10-05 NOTE — Assessment & Plan Note (Signed)

## 2020-10-05 NOTE — Assessment & Plan Note (Signed)
Patient doing very well with no signs of CNVM, I have encouraged patient to use oral vitamin supplementation to minimize the risk of progression to wet AMD  AREDS 2 formula any brand that she toleratesThe nature of age--related macular degeneration was discussed with the patient as well as the distinction between dry and wet types. Checking an Amsler Grid daily with advice to return immediately should a distortion develop, was given to the patient. The patient 's smoking status now and in the past was determined and advice based on the AREDS study was provided regarding the consumption of antioxidant supplements. AREDS 2 vitamin formulation was recommended. Consumption of dark leafy vegetables and fresh fruits of various colors was recommended. Treatment modalities for wet macular degeneration particularly the use of intravitreal injections of anti-blood vessel growth factors was discussed with the patient. Avastin, Lucentis, and Eylea are the available options. On occasion, therapy includes the use of photodynamic therapy and thermal laser. Stressed to the patient do not rub eyes.  Patient was advised to check Amsler Grid daily and return immediately if changes are noted. Instructions on using the grid were given to the patient. All patient questions were answered.

## 2020-10-26 DIAGNOSIS — Z23 Encounter for immunization: Secondary | ICD-10-CM | POA: Diagnosis not present

## 2020-10-26 DIAGNOSIS — Z20822 Contact with and (suspected) exposure to covid-19: Secondary | ICD-10-CM | POA: Diagnosis not present

## 2020-11-16 DIAGNOSIS — H52203 Unspecified astigmatism, bilateral: Secondary | ICD-10-CM | POA: Diagnosis not present

## 2020-11-16 DIAGNOSIS — H35372 Puckering of macula, left eye: Secondary | ICD-10-CM | POA: Diagnosis not present

## 2020-11-16 DIAGNOSIS — H353132 Nonexudative age-related macular degeneration, bilateral, intermediate dry stage: Secondary | ICD-10-CM | POA: Diagnosis not present

## 2020-11-16 DIAGNOSIS — Z961 Presence of intraocular lens: Secondary | ICD-10-CM | POA: Diagnosis not present

## 2020-11-16 DIAGNOSIS — H524 Presbyopia: Secondary | ICD-10-CM | POA: Diagnosis not present

## 2020-11-24 ENCOUNTER — Other Ambulatory Visit (HOSPITAL_COMMUNITY): Payer: Self-pay | Admitting: Internal Medicine

## 2020-11-24 DIAGNOSIS — R413 Other amnesia: Secondary | ICD-10-CM

## 2020-11-24 DIAGNOSIS — D519 Vitamin B12 deficiency anemia, unspecified: Secondary | ICD-10-CM | POA: Diagnosis not present

## 2020-11-25 DIAGNOSIS — R413 Other amnesia: Secondary | ICD-10-CM | POA: Diagnosis not present

## 2020-12-07 ENCOUNTER — Other Ambulatory Visit: Payer: Self-pay

## 2020-12-07 ENCOUNTER — Ambulatory Visit (HOSPITAL_COMMUNITY)
Admission: RE | Admit: 2020-12-07 | Discharge: 2020-12-07 | Disposition: A | Discharge status: Home / Self Care | Payer: Medicare Other | Source: Ambulatory Visit | Attending: Internal Medicine | Admitting: Internal Medicine

## 2020-12-07 DIAGNOSIS — R413 Other amnesia: Secondary | ICD-10-CM | POA: Insufficient documentation

## 2020-12-07 DIAGNOSIS — G319 Degenerative disease of nervous system, unspecified: Secondary | ICD-10-CM | POA: Diagnosis not present

## 2020-12-14 DIAGNOSIS — Z8673 Personal history of transient ischemic attack (TIA), and cerebral infarction without residual deficits: Secondary | ICD-10-CM | POA: Diagnosis not present

## 2020-12-14 DIAGNOSIS — I10 Essential (primary) hypertension: Secondary | ICD-10-CM | POA: Diagnosis not present

## 2020-12-15 ENCOUNTER — Other Ambulatory Visit: Payer: Self-pay

## 2020-12-15 ENCOUNTER — Emergency Department (HOSPITAL_COMMUNITY): Payer: Medicare Other

## 2020-12-15 ENCOUNTER — Encounter (HOSPITAL_COMMUNITY): Payer: Self-pay | Admitting: *Deleted

## 2020-12-15 ENCOUNTER — Emergency Department (HOSPITAL_COMMUNITY)
Admission: EM | Admit: 2020-12-15 | Discharge: 2020-12-15 | Disposition: A | Discharge status: Home / Self Care | Payer: Medicare Other | Attending: Emergency Medicine | Admitting: Emergency Medicine

## 2020-12-15 DIAGNOSIS — W101XXA Fall (on)(from) sidewalk curb, initial encounter: Secondary | ICD-10-CM | POA: Diagnosis not present

## 2020-12-15 DIAGNOSIS — Z96641 Presence of right artificial hip joint: Secondary | ICD-10-CM | POA: Insufficient documentation

## 2020-12-15 DIAGNOSIS — Z7901 Long term (current) use of anticoagulants: Secondary | ICD-10-CM | POA: Insufficient documentation

## 2020-12-15 DIAGNOSIS — S0990XA Unspecified injury of head, initial encounter: Secondary | ICD-10-CM | POA: Diagnosis not present

## 2020-12-15 DIAGNOSIS — W19XXXA Unspecified fall, initial encounter: Secondary | ICD-10-CM | POA: Diagnosis not present

## 2020-12-15 DIAGNOSIS — S0001XA Abrasion of scalp, initial encounter: Secondary | ICD-10-CM | POA: Diagnosis not present

## 2020-12-15 DIAGNOSIS — Z23 Encounter for immunization: Secondary | ICD-10-CM | POA: Insufficient documentation

## 2020-12-15 DIAGNOSIS — S0993XA Unspecified injury of face, initial encounter: Secondary | ICD-10-CM | POA: Diagnosis not present

## 2020-12-15 DIAGNOSIS — Z87891 Personal history of nicotine dependence: Secondary | ICD-10-CM | POA: Insufficient documentation

## 2020-12-15 DIAGNOSIS — M47812 Spondylosis without myelopathy or radiculopathy, cervical region: Secondary | ICD-10-CM | POA: Diagnosis not present

## 2020-12-15 DIAGNOSIS — Z79899 Other long term (current) drug therapy: Secondary | ICD-10-CM | POA: Diagnosis not present

## 2020-12-15 DIAGNOSIS — I10 Essential (primary) hypertension: Secondary | ICD-10-CM | POA: Diagnosis not present

## 2020-12-15 DIAGNOSIS — Y92019 Unspecified place in single-family (private) house as the place of occurrence of the external cause: Secondary | ICD-10-CM | POA: Insufficient documentation

## 2020-12-15 DIAGNOSIS — I6529 Occlusion and stenosis of unspecified carotid artery: Secondary | ICD-10-CM | POA: Diagnosis not present

## 2020-12-15 DIAGNOSIS — S0031XA Abrasion of nose, initial encounter: Secondary | ICD-10-CM | POA: Insufficient documentation

## 2020-12-15 DIAGNOSIS — Z96651 Presence of right artificial knee joint: Secondary | ICD-10-CM | POA: Insufficient documentation

## 2020-12-15 DIAGNOSIS — S0081XA Abrasion of other part of head, initial encounter: Secondary | ICD-10-CM | POA: Diagnosis not present

## 2020-12-15 DIAGNOSIS — I6381 Other cerebral infarction due to occlusion or stenosis of small artery: Secondary | ICD-10-CM | POA: Diagnosis not present

## 2020-12-15 DIAGNOSIS — S199XXA Unspecified injury of neck, initial encounter: Secondary | ICD-10-CM | POA: Diagnosis not present

## 2020-12-15 MED ORDER — ACETAMINOPHEN 325 MG PO TABS
650.0000 mg | ORAL_TABLET | Freq: Once | ORAL | Status: AC
  Administered 2020-12-15: 650 mg via ORAL
  Filled 2020-12-15: qty 2

## 2020-12-15 MED ORDER — TETANUS-DIPHTH-ACELL PERTUSSIS 5-2.5-18.5 LF-MCG/0.5 IM SUSY
0.5000 mL | PREFILLED_SYRINGE | Freq: Once | INTRAMUSCULAR | Status: AC
  Administered 2020-12-15: 0.5 mL via INTRAMUSCULAR
  Filled 2020-12-15: qty 0.5

## 2020-12-15 NOTE — ED Provider Notes (Signed)
[facility] EMERGENCY DEPARTMENT Provider Note   CSN: [id number] Arrival date & time: 12/15/20  1900     History Chief Complaint  Patient presents with   Mackenzie [name] is a 80 y.o. female.  She is here after mechanical fall tripped over the curb outside.  She denies any loss of consciousness.  Complaining of a headache and some facial pain.  No blurry vision double vision chest pain shortness of breath numbness or weakness.  She is on blood thinners  The history is provided by the patient and the EMS personnel.  Fall This is a new problem. The current episode started 1 to 2 hours ago. The problem has not changed since onset.Associated symptoms include headaches. Pertinent negatives include no chest pain, no abdominal pain and no shortness of breath. Nothing aggravates the symptoms. Nothing relieves the symptoms. She has tried nothing for the symptoms. The treatment provided no relief.      Past Medical History:  Diagnosis Date   Anemia, normocytic normochromic 01/2012   hx of   Anxiety    takes Xanax daily as needed   Arthritis    knees, hands   Cataract    right eye   Depression    takes Citalopram daily   Dizziness    cardiologist is aware and told pt it was related to meds   History of bronchitis    2014   History of colon polyps    benign   History of migraine    Hyperlipidemia    takes Pravastatin daily   Hypertension    takes Metoprolol and Azor daily   Joint pain    Pneumonia    hx of-2015   Primary localized osteoarthritis of right knee 10/01/2019   Pseudogout of knee, right 02/22/2017   Renal insufficiency 02/22/2017   Rheumatic fever    hx of   Wheezing on expiration     Patient Active Problem List   Diagnosis Date Noted   H/O vitrectomy 01/16/2020   Macular pucker, left eye 12/24/2019   Intermediate stage nonexudative age-related macular degeneration of both eyes 12/24/2019   Posterior vitreous detachment of right eye 12/24/2019   Pseudophakia  of both eyes 12/24/2019   Primary localized osteoarthritis of right knee 10/01/2019   Lumbar disc herniation 01/24/2018   Constipation 03/09/2017   Pseudogout of knee, right 02/22/2017   Renal insufficiency 02/22/2017   Septic joint of right knee joint ([place]) 02/21/2017   Infection of right knee ([place]) 02/06/2017   Septic arthritis ([place]) 02/06/2017   Chest pain 03/04/2015   Essential hypertension    Dyslipidemia    Primary osteoarthritis of right hip 12/16/2014   Postoperative anemia due to acute blood loss 12/16/2014   Bilateral carotid artery disease ([place]) 11/05/2014   Multiple fractures of ribs of right side 06/18/2012   Pneumothorax on right 06/18/2012   Unstable angina ([place]) 01/31/2012   Cerebrovascular disease 01/31/2012   Arteriosclerotic cardiovascular disease (ASCVD)    Hyperlipidemia    Hypertension    Anemia, normocytic normochromic 01/15/2012   MITRAL VALVE PROLAPSE 12/24/2008    Past Surgical History:  Procedure Laterality Date   ABDOMINAL HYSTERECTOMY     APPENDECTOMY     CARDIAC CATHETERIZATION     early 2000's   CARDIAC CATHETERIZATION N/A 03/04/2015   Procedure: Left Heart Cath and Coronary Angiography;  Surgeon: Sherren Mocha, MD;  Location: [place] [facility];  Service: Cardiovascular;  Laterality: N/A;   CATARACT EXTRACTION Bilateral  cataract surgery Left    CHOLECYSTECTOMY     CHONDROPLASTY Right 02/21/2017   Procedure: CHONDROPLASTY WITH DEBRIDEMENT;  Surgeon: Elsie Saas, MD;  Location: [place];  Service: Orthopedics;  Laterality: Right;   COLONOSCOPY  2005   Negative screening study   COLONOSCOPY N/A 04/04/2014   Procedure: COLONOSCOPY;  Surgeon: Rogene [place], MD;  Location: [facility];  Service: Endoscopy;  Laterality: N/A;  1225   EXPLORATORY LAPAROTOMY     x 3   EYE SURGERY     IR GENERIC HISTORICAL  10/15/2015   IR RADIOLOGIST EVAL & MGMT 10/15/2015 [facility]   IR GENERIC HISTORICAL  10/20/2015   IR KYPHO LUMBAR INC  FX REDUCE BONE BX UNI/BIL CANNULATION INC/IMAGING 10/20/2015 Luanne Bras, MD [facility]   IRRIGATION AND DEBRIDEMENT KNEE Right 02/21/2017   Procedure: IRRIGATION AND DEBRIDEMENT KNEE;  Surgeon: Elsie Saas, MD;  Location: [place];  Service: Orthopedics;  Laterality: Right;   LUMBAR LAMINECTOMY/DECOMPRESSION MICRODISCECTOMY Left 01/24/2018   Procedure: LEFT LUMBAR FOUR- LUMBAR FIVE  LAMINOTOMY AND MICRODISCECTOMY;  Surgeon: Jovita Gamma, MD;  Location: [place];  Service: Neurosurgery;  Laterality: Left;  LEFT LUMBAR 4- LUMBAR 5  LAMINOTOMY AND MICRODISCECTOMY   TOTAL HIP ARTHROPLASTY Right 12/16/2014   Procedure: TOTAL HIP ARTHROPLASTY ANTERIOR APPROACH;  Surgeon: Renette Butters, MD;  Location: [place];  Service: Orthopedics;  Laterality: Right;   TOTAL KNEE ARTHROPLASTY Right 10/11/2019   Procedure: TOTAL KNEE ARTHROPLASTY;  Surgeon: Elsie Saas, MD;  Location: [facility];  Service: Orthopedics;  Laterality: Right;     OB History   No obstetric history on file.     Family History  Problem Relation Age of Onset   Heart attack Mother 3       heart problems   Cancer Mother        uterus   Pancreatitis Sister        died  age 61   Colon cancer Maternal Aunt 22   Colon cancer Maternal Aunt 78   Colon cancer Maternal Aunt 76   Cancer Other        FH   Diabetes Other        FH   Hypertension Other        FH   Diabetes Son    Diabetes Daughter    Colon cancer Maternal Uncle 85    Social History   Tobacco Use   Smoking status: Former    Packs/day: 1.00    Years: 15.00    Pack years: 15.00    Types: Cigarettes   Smokeless tobacco: Never   Tobacco comments:    quit smoking 30+yrs ago  Vaping Use   Vaping Use: Never used  Substance Use Topics   Alcohol use: Yes    Alcohol/week: 3.0 standard drinks    Types: 3 Glasses of wine per week    Comment: socially wine   Drug use: No    Home Medications Prior to Admission medications   Medication Sig  Start Date End Date Taking? Authorizing Provider  acetaminophen (TYLENOL) 500 MG tablet Take 1,000 mg by mouth every 6 (six) hours as needed for moderate pain or headache.    [provider]  ALPRAZolam Duanne Moron) 0.25 MG tablet Take 0.0625-0.25 mg by mouth 2 (two) times daily as needed for anxiety.  07/26/19   [provider]  amLODipine (NORVASC) 5 MG tablet Take 5 mg by mouth daily. 07/13/19   [provider]  apixaban (ELIQUIS) 2.5 MG TABS tablet Take 1 tablet (2.5 mg total) by mouth daily. 10/12/19   Shepperson, Kirstin, PA-C  citalopram (CELEXA) 10 MG tablet Take 10 mg by mouth daily.    [provider]  Cyanocobalamin (VITAMIN B-12 IJ) Inject 1 mL as directed every 30 (thirty) days.    [provider]  docusate sodium (COLACE) 100 MG capsule 1 tab 2 times a day while on narcotics.  STOOL SOFTENER 10/12/19   Shepperson, Kirstin, PA-C  gabapentin (NEURONTIN) 300 MG capsule Take 1 capsule (300 mg total) by mouth at bedtime. .for nerve pain 10/12/19   Shepperson, Kirstin, PA-C  metoprolol succinate (TOPROL-XL) 25 MG 24 hr tablet Take 25 mg by mouth daily.      [provider]  oxyCODONE (OXY IR/ROXICODONE) 5 MG immediate release tablet Take 1 tablet (5 mg total) by mouth every 4 (four) hours as needed (pain). 10/12/19   Shepperson, Kirstin, PA-C  polyethylene glycol (MIRALAX / GLYCOLAX) 17 g packet 17grams in 6 oz of something to drink twice a day until bowel movement.  LAXITIVE.  Restart if two days since last bowel movement 10/12/19   Shepperson, Kirstin, PA-C  pravastatin (PRAVACHOL) 40 MG tablet Take 40 mg by mouth once a week.     [provider]    Allergies    Nickel, Palladium chloride, Statins, Bacitracin, and Gold sodium thiosulfate  Review of Systems   Review of Systems  Constitutional:  Negative for fever.  HENT:  Negative for sore throat.   Eyes:  Negative for visual disturbance.  Respiratory:  Negative for shortness of  breath.   Cardiovascular:  Negative for chest pain.  Gastrointestinal:  Negative for abdominal pain.  Genitourinary:  Negative for dysuria.  Musculoskeletal:  Negative for neck pain.  Skin:  Positive for wound. Negative for rash.  Neurological:  Positive for headaches.   Physical Exam Updated Vital Signs BP (!) 144/50 (BP Location: Right Arm)   Pulse 75   Temp 97.6 F (36.4 C) (Oral)   Resp 20   Ht 5' (1.524 m)   Wt 53.1 kg   SpO2 99%   BMI 22.85 kg/m   Physical Exam Vitals and nursing note reviewed.  Constitutional:      General: She is not in acute distress.    Appearance: Normal appearance. She is well-developed.  HENT:     Head: Normocephalic.     Comments: She has abrasions over her forehead and her nose.  No suturable lacerations.  No facial instability. Eyes:     Conjunctiva/sclera: Conjunctivae normal.  Cardiovascular:     Rate and Rhythm: Normal rate and regular rhythm.     Heart sounds: No murmur heard. Pulmonary:     Effort: Pulmonary effort is normal. No respiratory distress.     Breath sounds: Normal breath sounds.  Abdominal:     Palpations: Abdomen is soft.     Tenderness: There is no abdominal tenderness. There is no guarding or rebound.  Musculoskeletal:        General: No deformity. Normal range of motion.     Cervical back: Neck supple.  Skin:    General: Skin is warm and dry.  Neurological:     General: No focal deficit present.     Mental Status: She is alert and oriented to person, place, and time.     Cranial Nerves: No cranial nerve deficit.     Sensory: No sensory deficit.     Motor: No weakness.  ED Results / Procedures / Treatments   Labs (all labs ordered are listed, but only abnormal results are displayed) Labs Reviewed - No data to display  EKG None  Radiology CT Head Wo Contrast  Result Date: 12/15/2020 CLINICAL DATA:  Facial trauma Head trauma, minor (Age >= 65y); Facial trauma; Neck trauma (Age >= 65y). Fall on  concrete, facial injury, scalp abrasion. EXAM: CT HEAD WITHOUT CONTRAST CT MAXILLOFACIAL WITHOUT CONTRAST CT CERVICAL SPINE WITHOUT CONTRAST TECHNIQUE: Multidetector CT imaging of the head, cervical spine, and maxillofacial structures were performed using the standard protocol without intravenous contrast. Multiplanar CT image reconstructions of the cervical spine and maxillofacial structures were also generated. COMPARISON:  MRI head 11/23/2013 FINDINGS: CT HEAD FINDINGS Brain: Normal anatomic configuration. Moderate parenchymal volume loss is commensurate with the patient's age. Remote lacunar infarcts are noted within the basal ganglia bilaterally. Moderate periventricular white matter changes are present likely reflecting the sequela of small vessel ischemia. These changes appear progressive since prior examination. No abnormal intra or extra-axial mass lesion or fluid collection. No abnormal mass effect or midline shift. No evidence of acute intracranial hemorrhage or infarct. Ventricular size is normal. Cerebellum unremarkable. Vascular: No asymmetric hyperdense vasculature at the skull base. Moderate atherosclerotic calcification is noted within the carotid siphons. Skull: Intact Other: Mastoid air cells and middle ear cavities are clear. CT MAXILLOFACIAL FINDINGS Osseous: No fracture or mandibular dislocation. No destructive process. Orbits: Negative. No traumatic or inflammatory finding. The ocular lenses have been removed. Sinuses: Clear. Soft tissues: Choose CT CERVICAL SPINE FINDINGS Alignment: Normal alignment.  No listhesis. Skull base and vertebrae: Craniocervical alignment is normal. Atlantodental interval is not widened. No acute fracture of the cervical spine. Ankylosis of the C2-3 facet joints is noted bilaterally. Soft tissues and spinal canal: No prevertebral fluid or swelling. No visible canal hematoma. The spinal canal is widely patent. Disc levels: There is mild intervertebral disc space  narrowing and endplate remodeling noted at C5-6. Intra discal calcifications are noted throughout the cervical spine. The findings are in keeping with changes of mild to moderate degenerative disc disease, most severe at C5-6. The prevertebral soft tissues are not thickened on sagittal reformats. Review of the axial images demonstrates mild right neuroforaminal narrowing at C5-6 secondary to uncovertebral arthrosis. Remaining neural foramina are widely patent. Upper chest: Unremarkable Other: Extensive atherosclerotic calcification is noted within the carotid bifurcations bilaterally. IMPRESSION: No acute intracranial injury.  No calvarial fracture. No acute facial fracture. No acute fracture or listhesis of the cervical spine. Electronically Signed   By: Fidela Salisbury M.D.   On: 12/15/2020 20:37   CT Cervical Spine Wo Contrast  Result Date: 12/15/2020 CLINICAL DATA:  Facial trauma Head trauma, minor (Age >= 65y); Facial trauma; Neck trauma (Age >= 65y). Fall on concrete, facial injury, scalp abrasion. EXAM: CT HEAD WITHOUT CONTRAST CT MAXILLOFACIAL WITHOUT CONTRAST CT CERVICAL SPINE WITHOUT CONTRAST TECHNIQUE: Multidetector CT imaging of the head, cervical spine, and maxillofacial structures were performed using the standard protocol without intravenous contrast. Multiplanar CT image reconstructions of the cervical spine and maxillofacial structures were also generated. COMPARISON:  MRI head 11/23/2013 FINDINGS: CT HEAD FINDINGS Brain: Normal anatomic configuration. Moderate parenchymal volume loss is commensurate with the patient's age. Remote lacunar infarcts are noted within the basal ganglia bilaterally. Moderate periventricular white matter changes are present likely reflecting the sequela of small vessel ischemia. These changes appear progressive since prior examination. No abnormal intra or extra-axial mass lesion or fluid collection. No abnormal mass effect or  midline shift. No evidence of acute  intracranial hemorrhage or infarct. Ventricular size is normal. Cerebellum unremarkable. Vascular: No asymmetric hyperdense vasculature at the skull base. Moderate atherosclerotic calcification is noted within the carotid siphons. Skull: Intact Other: Mastoid air cells and middle ear cavities are clear. CT MAXILLOFACIAL FINDINGS Osseous: No fracture or mandibular dislocation. No destructive process. Orbits: Negative. No traumatic or inflammatory finding. The ocular lenses have been removed. Sinuses: Clear. Soft tissues: Choose CT CERVICAL SPINE FINDINGS Alignment: Normal alignment.  No listhesis. Skull base and vertebrae: Craniocervical alignment is normal. Atlantodental interval is not widened. No acute fracture of the cervical spine. Ankylosis of the C2-3 facet joints is noted bilaterally. Soft tissues and spinal canal: No prevertebral fluid or swelling. No visible canal hematoma. The spinal canal is widely patent. Disc levels: There is mild intervertebral disc space narrowing and endplate remodeling noted at C5-6. Intra discal calcifications are noted throughout the cervical spine. The findings are in keeping with changes of mild to moderate degenerative disc disease, most severe at C5-6. The prevertebral soft tissues are not thickened on sagittal reformats. Review of the axial images demonstrates mild right neuroforaminal narrowing at C5-6 secondary to uncovertebral arthrosis. Remaining neural foramina are widely patent. Upper chest: Unremarkable Other: Extensive atherosclerotic calcification is noted within the carotid bifurcations bilaterally. IMPRESSION: No acute intracranial injury.  No calvarial fracture. No acute facial fracture. No acute fracture or listhesis of the cervical spine. Electronically Signed   By: Fidela Salisbury M.D.   On: 12/15/2020 20:37   CT Maxillofacial WO CM  Result Date: 12/15/2020 CLINICAL DATA:  Facial trauma Head trauma, minor (Age >= 65y); Facial trauma; Neck trauma (Age >=  65y). Fall on concrete, facial injury, scalp abrasion. EXAM: CT HEAD WITHOUT CONTRAST CT MAXILLOFACIAL WITHOUT CONTRAST CT CERVICAL SPINE WITHOUT CONTRAST TECHNIQUE: Multidetector CT imaging of the head, cervical spine, and maxillofacial structures were performed using the standard protocol without intravenous contrast. Multiplanar CT image reconstructions of the cervical spine and maxillofacial structures were also generated. COMPARISON:  MRI head 11/23/2013 FINDINGS: CT HEAD FINDINGS Brain: Normal anatomic configuration. Moderate parenchymal volume loss is commensurate with the patient's age. Remote lacunar infarcts are noted within the basal ganglia bilaterally. Moderate periventricular white matter changes are present likely reflecting the sequela of small vessel ischemia. These changes appear progressive since prior examination. No abnormal intra or extra-axial mass lesion or fluid collection. No abnormal mass effect or midline shift. No evidence of acute intracranial hemorrhage or infarct. Ventricular size is normal. Cerebellum unremarkable. Vascular: No asymmetric hyperdense vasculature at the skull base. Moderate atherosclerotic calcification is noted within the carotid siphons. Skull: Intact Other: Mastoid air cells and middle ear cavities are clear. CT MAXILLOFACIAL FINDINGS Osseous: No fracture or mandibular dislocation. No destructive process. Orbits: Negative. No traumatic or inflammatory finding. The ocular lenses have been removed. Sinuses: Clear. Soft tissues: Choose CT CERVICAL SPINE FINDINGS Alignment: Normal alignment.  No listhesis. Skull base and vertebrae: Craniocervical alignment is normal. Atlantodental interval is not widened. No acute fracture of the cervical spine. Ankylosis of the C2-3 facet joints is noted bilaterally. Soft tissues and spinal canal: No prevertebral fluid or swelling. No visible canal hematoma. The spinal canal is widely patent. Disc levels: There is mild intervertebral  disc space narrowing and endplate remodeling noted at C5-6. Intra discal calcifications are noted throughout the cervical spine. The findings are in keeping with changes of mild to moderate degenerative disc disease, most severe at C5-6. The prevertebral soft tissues are not thickened  on sagittal reformats. Review of the axial images demonstrates mild right neuroforaminal narrowing at C5-6 secondary to uncovertebral arthrosis. Remaining neural foramina are widely patent. Upper chest: Unremarkable Other: Extensive atherosclerotic calcification is noted within the carotid bifurcations bilaterally. IMPRESSION: No acute intracranial injury.  No calvarial fracture. No acute facial fracture. No acute fracture or listhesis of the cervical spine. Electronically Signed   By: Fidela Salisbury M.D.   On: 12/15/2020 20:37    Procedures Procedures   Medications Ordered in ED Medications  acetaminophen (TYLENOL) tablet 650 mg (has no administration in time range)  Tdap (BOOSTRIX) injection 0.5 mL (has no administration in time range)    ED Course  I have reviewed the triage vital signs and the nursing notes.  Pertinent labs & imaging results that were available during my care of the patient were reviewed by me and considered in my medical decision making (see chart for details).  Clinical Course as of 12/16/20 1019  Tue Dec 15, 2020  2058 Patient's imaging does not show any acute traumatic findings.  I reviewed this with patient and her son.  They are comfortable plan for discharge and symptomatic treatment.  Return instructions discussed [MB]    Clinical Course User Index [MB] Hayden Rasmussen, MD   MDM Rules/Calculators/A&P                          This patient complains of headache facial abrasions after a trip and fall; this involves an extensive number of treatment Options and is a complaint that carries with it a high risk of complications and Morbidity. The differential includes head injury,  intracranial bleed, skull fracture, facial fractures, cervical fracture, soft tissue injury  I ordered medication tetanus update I ordered imaging studies which included CT head cervical spine and max face and I independently    visualized and interpreted imaging which showed no acute findings Additional history obtained from patient's son and EMS Previous records obtained and reviewed in epic no recent admissions  After the interventions stated above, I reevaluated the patient and found patient be awake and alert.  Reviewed findings with her.  She is comfortable plan for discharge.    Final Clinical Impression(s) / ED Diagnoses Final diagnoses:  Fall, initial encounter  Injury of head, initial encounter  Abrasion of face, initial encounter    Rx / DC Orders ED Discharge Orders     None        Hayden Rasmussen, MD 12/16/20 1021

## 2020-12-15 NOTE — Discharge Instructions (Signed)
You are seen in the emergency department for evaluation of injuries from a fall.  You had a CAT scan of your head neck and face that did not show any acute traumatic findings.  You can use soap and water to your abrasions and apply some bacitracin ointment.  Tylenol for pain.  Follow-up with your doctor.  Return to the emergency department if any worsening or concerning symptoms.

## 2020-12-15 NOTE — ED Triage Notes (Signed)
Pt brought in by [facility] with c/o fall today while at a [employer] [place]. She was outside and tripped over the edge of the sidewalk and fell face first. Unsure of LOC, but her son told EMS he didn't think she did. Pt has abrasions to face. Pt c/o nausea. BP 131/79, HR 85, O2 sat 99% RA.

## 2020-12-15 NOTE — ED Notes (Signed)
Pt ambulatory to restroom with minimal assistance   

## 2021-01-04 DIAGNOSIS — M1712 Unilateral primary osteoarthritis, left knee: Secondary | ICD-10-CM | POA: Diagnosis not present

## 2021-02-25 DIAGNOSIS — F01511 Vascular dementia, unspecified severity, with agitation: Secondary | ICD-10-CM | POA: Diagnosis not present

## 2021-05-24 DIAGNOSIS — F01A4 Vascular dementia, mild, with anxiety: Secondary | ICD-10-CM | POA: Diagnosis not present

## 2021-05-24 DIAGNOSIS — E785 Hyperlipidemia, unspecified: Secondary | ICD-10-CM | POA: Diagnosis not present

## 2021-05-24 DIAGNOSIS — Z79899 Other long term (current) drug therapy: Secondary | ICD-10-CM | POA: Diagnosis not present

## 2021-05-24 DIAGNOSIS — I10 Essential (primary) hypertension: Secondary | ICD-10-CM | POA: Diagnosis not present

## 2021-05-24 DIAGNOSIS — F32A Depression, unspecified: Secondary | ICD-10-CM | POA: Diagnosis not present

## 2021-05-27 DIAGNOSIS — I10 Essential (primary) hypertension: Secondary | ICD-10-CM | POA: Diagnosis not present

## 2021-05-27 DIAGNOSIS — E785 Hyperlipidemia, unspecified: Secondary | ICD-10-CM | POA: Diagnosis not present

## 2021-05-27 DIAGNOSIS — F0150 Vascular dementia without behavioral disturbance: Secondary | ICD-10-CM | POA: Diagnosis not present

## 2021-06-17 ENCOUNTER — Emergency Department (HOSPITAL_COMMUNITY)
Admission: EM | Admit: 2021-06-17 | Discharge: 2021-06-17 | Disposition: A | Discharge status: Home / Self Care | Payer: Medicare Other | Attending: Emergency Medicine | Admitting: Emergency Medicine

## 2021-06-17 ENCOUNTER — Encounter (HOSPITAL_COMMUNITY): Payer: Self-pay | Admitting: Emergency Medicine

## 2021-06-17 ENCOUNTER — Other Ambulatory Visit: Payer: Self-pay

## 2021-06-17 DIAGNOSIS — R6889 Other general symptoms and signs: Secondary | ICD-10-CM | POA: Diagnosis not present

## 2021-06-17 DIAGNOSIS — R52 Pain, unspecified: Secondary | ICD-10-CM | POA: Diagnosis not present

## 2021-06-17 DIAGNOSIS — I10 Essential (primary) hypertension: Secondary | ICD-10-CM | POA: Diagnosis not present

## 2021-06-17 DIAGNOSIS — Z7901 Long term (current) use of anticoagulants: Secondary | ICD-10-CM | POA: Diagnosis not present

## 2021-06-17 DIAGNOSIS — R7989 Other specified abnormal findings of blood chemistry: Secondary | ICD-10-CM | POA: Insufficient documentation

## 2021-06-17 DIAGNOSIS — Z79899 Other long term (current) drug therapy: Secondary | ICD-10-CM | POA: Diagnosis not present

## 2021-06-17 DIAGNOSIS — R4781 Slurred speech: Secondary | ICD-10-CM | POA: Diagnosis not present

## 2021-06-17 DIAGNOSIS — R519 Headache, unspecified: Secondary | ICD-10-CM | POA: Diagnosis not present

## 2021-06-17 DIAGNOSIS — Z743 Need for continuous supervision: Secondary | ICD-10-CM | POA: Diagnosis not present

## 2021-06-17 LAB — BASIC METABOLIC PANEL
Anion gap: 9 (ref 5–15)
BUN: 31 mg/dL — ABNORMAL HIGH (ref 8–23)
CO2: 27 mmol/L (ref 22–32)
Calcium: 9 mg/dL (ref 8.9–10.3)
Chloride: 100 mmol/L (ref 98–111)
Creatinine, Ser: 1.01 mg/dL — ABNORMAL HIGH (ref 0.44–1.00)
GFR, Estimated: 56 mL/min — ABNORMAL LOW (ref >60)
Glucose, Bld: 97 mg/dL (ref 70–99)
Potassium: 3.7 mmol/L (ref 3.5–5.1)
Sodium: 136 mmol/L (ref 135–145)

## 2021-06-17 LAB — CBC
HCT: 34 % — ABNORMAL LOW (ref 36.0–46.0)
Hemoglobin: 11.1 g/dL — ABNORMAL LOW (ref 12.0–15.0)
MCH: 30.8 pg (ref 26.0–34.0)
MCHC: 32.6 g/dL (ref 30.0–36.0)
MCV: 94.4 fL (ref 80.0–100.0)
Platelets: 320 10*3/uL (ref 150–400)
RBC: 3.6 MIL/uL — ABNORMAL LOW (ref 3.87–5.11)
RDW: 12.8 % (ref 11.5–15.5)
WBC: 8.2 10*3/uL (ref 4.0–10.5)
nRBC: 0 % (ref 0.0–0.2)

## 2021-06-17 MED ORDER — NAPROXEN 250 MG PO TABS
500.0000 mg | ORAL_TABLET | Freq: Once | ORAL | Status: AC
  Administered 2021-06-17: 500 mg via ORAL
  Filled 2021-06-17: qty 2

## 2021-06-17 MED ORDER — NAPROXEN 500 MG PO TABS: 500.0000 mg | ORAL_TABLET | Freq: Two times a day (BID) | ORAL | 0 refills | Status: DC

## 2021-06-17 NOTE — Discharge Instructions (Signed)
Your testing has been reassuring, your vital signs were also reassuring, I would like for you to talk to your doctor in the morning to let them know you are in the emergency department but please come back to the emergency department for severe worsening symptoms. ? ?You may take Naprosyn, 1 tablet twice a day as needed for severe or worsening pain however if the pain is worsening if you are vomiting, having severe headaches, severe vomiting, severe fevers, changes in vision, changes in numbness or weakness of the arms of the legs, slurred speech, facial droop, then I want you to return to the emergency department immediately. ?

## 2021-06-17 NOTE — ED Triage Notes (Signed)
Pt c/o right sided headache that started this afternoon but states the pain has increased. Pt states hx of headaches but hasn't had one in several years.  ?

## 2021-06-17 NOTE — ED Provider Notes (Signed)
?[facility] ?Provider Note ? ? ?CSN: [id number] ?Arrival date & time: 06/17/21  2009 ? ?  ? ?History ? ?Chief Complaint  ?Patient presents with  ? Headache  ? ? ?Mackenzie MCAULEY is a 81 y.o. female. ? ? ?Headache ? ?This patient is an 81 year old female, she has a history significant for prior transient ischemic attack and stroke, as far she knows she does not have any cardiac dysfunction, she does take amlodipine and metoprolol for hypertension and is on Eliquis.  She also takes occasional Xanax as needed for anxiety.  She lives by herself, she has been struggling with a headache today, she states that while she was sitting down resting without any symptoms she had acute onset of a discomfort in her head, this was around her right eye and seem to radiate to the right temporal region, it was short-lived, resolved spontaneously in less than a minute but has recurred a couple of times since then.  When she was trying to look up online what she needed to do it recommended that she be evaluated in person.  She denies any recent head injuries though she did bump her head about 4 months ago.  She denies any fevers chills nausea vomiting diarrhea coughing shortness of breath chest pain back pain neck pain numbness or weakness.  She states that she has been progressively having some disequilibrium over time and in fact her family doctor had ordered an MRI of her brain that was performed last year about 6 months ago that showed that she had had a couple of chronic infarcts but nothing new.  At this time the patient is headache free, she has no changes in vision, no pain around her eyes, no blurry vision, she denies any nausea or vomiting or light sensitivity.  She does recall having headaches when she was younger but they did not feel like this ? ?Home Medications ?Prior to Admission medications   ?Medication Sig Start Date End Date Taking? Authorizing Provider  ?naproxen (NAPROSYN) 500 MG tablet Take 1 tablet  (500 mg total) by mouth 2 (two) times daily with a meal. 06/17/21  Yes Noemi Chapel, MD  ?acetaminophen (TYLENOL) 500 MG tablet Take 1,000 mg by mouth every 6 (six) hours as needed for moderate pain or headache.    [provider]  ?ALPRAZolam Duanne Moron) 0.25 MG tablet Take 0.0625-0.25 mg by mouth 2 (two) times daily as needed for anxiety.  07/26/19   [provider]  ?amLODipine (NORVASC) 5 MG tablet Take 5 mg by mouth daily. 07/13/19   [provider]  ?apixaban (ELIQUIS) 2.5 MG TABS tablet Take 1 tablet (2.5 mg total) by mouth daily. 10/12/19   Shepperson, Kirstin, PA-C  ?citalopram (CELEXA) 10 MG tablet Take 10 mg by mouth daily.    [provider]  ?Cyanocobalamin (VITAMIN B-12 IJ) Inject 1 mL as directed every 30 (thirty) days.    [provider]  ?docusate sodium (COLACE) 100 MG capsule 1 tab 2 times a day while on narcotics.  STOOL SOFTENER 10/12/19   Shepperson, Kirstin, PA-C  ?gabapentin (NEURONTIN) 300 MG capsule Take 1 capsule (300 mg total) by mouth at bedtime. .for nerve pain 10/12/19   Shepperson, Kirstin, PA-C  ?metoprolol succinate (TOPROL-XL) 25 MG 24 hr tablet Take 25 mg by mouth daily.      [provider]  ?oxyCODONE (OXY IR/ROXICODONE) 5 MG immediate release tablet Take 1 tablet (5 mg total) by mouth every 4 (four) hours as needed (  pain). 10/12/19   Shepperson, Kirstin, PA-C  ?polyethylene glycol (MIRALAX / GLYCOLAX) 17 g packet 17grams in 6 oz of something to drink twice a day until bowel movement.  LAXITIVE.  Restart if two days since last bowel movement 10/12/19   Shepperson, Kirstin, PA-C  ?pravastatin (PRAVACHOL) 40 MG tablet Take 40 mg by mouth once a week.     [provider]  ?   ? ?Allergies    ?Nickel, Palladium chloride, Statins, Bacitracin, and Gold sodium thiosulfate   ? ?Review of Systems   ?Review of Systems  ?Neurological:  Positive for headaches.  ?All other systems reviewed and are negative. ? ?Physical Exam ?Updated Vital  Signs ?BP (!) 140/55   Pulse (!) 52   Temp (!) 97.5 ?F (36.4 ?C) (Oral)   Resp 13   Ht 1.524 m (5')   Wt 49 kg   SpO2 97%   BMI 21.09 kg/m?  ?Physical Exam ?Vitals and nursing note reviewed.  ?Constitutional:   ?   General: She is not in acute distress. ?   Appearance: She is well-developed.  ?HENT:  ?   Head: Normocephalic and atraumatic.  ?   Comments: No tenderness over the temporal arteries bilaterally ?   Mouth/Throat:  ?   Pharynx: No oropharyngeal exudate.  ?Eyes:  ?   General: No scleral icterus.    ?   Right eye: No discharge.     ?   Left eye: No discharge.  ?   Conjunctiva/sclera: Conjunctivae normal.  ?   Pupils: Pupils are equal, round, and reactive to light.  ?   Comments: Eye exam is normal with bilateral symmetrical reactive pupils, she has no steamy cornea, no erythema, no tenderness or pain with range of motion, no restricted range of motion, no periorbital redness or swelling  ?Neck:  ?   Thyroid: No thyromegaly.  ?   Vascular: No JVD.  ?Cardiovascular:  ?   Rate and Rhythm: Normal rate and regular rhythm.  ?   Heart sounds: Normal heart sounds. No murmur heard. ?  No friction rub. No gallop.  ?Pulmonary:  ?   Effort: Pulmonary effort is normal. No respiratory distress.  ?   Breath sounds: Normal breath sounds. No wheezing or rales.  ?Abdominal:  ?   General: Bowel sounds are normal. There is no distension.  ?   Palpations: Abdomen is soft. There is no mass.  ?   Tenderness: There is no abdominal tenderness.  ?Musculoskeletal:     ?   General: No tenderness. Normal range of motion.  ?   Cervical back: Normal range of motion and neck supple.  ?Lymphadenopathy:  ?   Cervical: No cervical adenopathy.  ?Skin: ?   General: Skin is warm and dry.  ?   Findings: No erythema or rash.  ?Neurological:  ?   Mental Status: She is alert.  ?   Coordination: Coordination normal.  ?   Comments: The patient is able to ambulate without difficulty, she is able to move around in the room without difficulty, she  uses all 4 extremities with normal strength sensation and coordination.  She is able to do finger-nose-finger without any concern whatsoever.  She has normal speech and is able to follow commands with exact this.  Sensation is diffusely intact, cranial nerves III through XII are unremarkable and normal  ?Psychiatric:     ?   Behavior: Behavior normal.  ? ? ?ED Results / Procedures / Treatments   ?  Labs ?(all labs ordered are listed, but only abnormal results are displayed) ?Labs Reviewed  ?BASIC METABOLIC PANEL - Abnormal; Notable for the following components:  ?    Result Value  ? BUN 31 (*)   ? Creatinine, Ser 1.01 (*)   ? GFR, Estimated 56 (*)   ? All other components within normal limits  ?CBC - Abnormal; Notable for the following components:  ? RBC 3.60 (*)   ? Hemoglobin 11.1 (*)   ? HCT 34.0 (*)   ? All other components within normal limits  ? ? ?EKG ?None ? ?Radiology ?No results found. ? ?Procedures ?Procedures  ? ? ?Medications Ordered in ED ?Medications  ?naproxen (NAPROSYN) tablet 500 mg (500 mg Oral Given 06/17/21 2240)  ? ? ?ED Course/ Medical Decision Making/ A&P ?  ?                        ?Medical Decision Making ?Amount and/or Complexity of Data Reviewed ?Labs: ordered. ? ?Risk ?Prescription drug management. ? ? ?Ultimately patient is well-appearing, vital signs are unremarkable, blood pressure 135/61 with a pulse of around 60, she is neurologically intact without any symptoms at all.  This does not seem to be a sentinel bleed, doubt aneurysm, more cluster like.  She has not missed any meals, has no other symptoms, will check some basic labs, she declines pain medicines at this time ? ?Labs: I personally viewed and interpreted the labs which show a normal creatinine of 1.01, slightly elevated BUN at 31 but normal electrolytes, hemoglobin is better than usual at 11.1, no leukocytosis. ? ?Patient received Naprosyn, vital signs were monitored on a cardiac monitor, the patient reports that she had a  couple more episodes of a slight fleeting headache around the right eye to the right temple but then goes away and she is currently asymptomatic.  This seems benign, she is certainly having an episodic headache b

## 2021-08-27 DIAGNOSIS — F0150 Vascular dementia without behavioral disturbance: Secondary | ICD-10-CM | POA: Diagnosis not present

## 2021-08-27 DIAGNOSIS — I10 Essential (primary) hypertension: Secondary | ICD-10-CM | POA: Diagnosis not present

## 2021-08-27 DIAGNOSIS — F411 Generalized anxiety disorder: Secondary | ICD-10-CM | POA: Diagnosis not present

## 2021-09-02 DIAGNOSIS — H52203 Unspecified astigmatism, bilateral: Secondary | ICD-10-CM | POA: Diagnosis not present

## 2021-09-02 DIAGNOSIS — H353132 Nonexudative age-related macular degeneration, bilateral, intermediate dry stage: Secondary | ICD-10-CM | POA: Diagnosis not present

## 2021-09-02 DIAGNOSIS — H524 Presbyopia: Secondary | ICD-10-CM | POA: Diagnosis not present

## 2021-09-02 DIAGNOSIS — Z961 Presence of intraocular lens: Secondary | ICD-10-CM | POA: Diagnosis not present

## 2021-10-04 DIAGNOSIS — M5416 Radiculopathy, lumbar region: Secondary | ICD-10-CM | POA: Diagnosis not present

## 2021-10-05 ENCOUNTER — Encounter (INDEPENDENT_AMBULATORY_CARE_PROVIDER_SITE_OTHER): Payer: Medicare Other | Admitting: Ophthalmology

## 2021-10-12 ENCOUNTER — Encounter (INDEPENDENT_AMBULATORY_CARE_PROVIDER_SITE_OTHER): Payer: Self-pay | Admitting: Ophthalmology

## 2021-10-12 ENCOUNTER — Ambulatory Visit (INDEPENDENT_AMBULATORY_CARE_PROVIDER_SITE_OTHER): Payer: Medicare Other | Admitting: Ophthalmology

## 2021-10-12 DIAGNOSIS — H43811 Vitreous degeneration, right eye: Secondary | ICD-10-CM | POA: Diagnosis not present

## 2021-10-12 DIAGNOSIS — H353132 Nonexudative age-related macular degeneration, bilateral, intermediate dry stage: Secondary | ICD-10-CM | POA: Diagnosis not present

## 2021-10-12 DIAGNOSIS — H35372 Puckering of macula, left eye: Secondary | ICD-10-CM | POA: Diagnosis not present

## 2021-10-12 NOTE — Progress Notes (Signed)
10/12/2021     CHIEF COMPLAINT Patient presents for  Chief Complaint  Patient presents with   Macular Degeneration      HISTORY OF PRESENT ILLNESS: Mackenzie [name] is a 81 y.o. female who presents to the clinic today for:   HPI   History of intermediate ARMD OU.  On vitamins has GI symptoms with the AREDS 2.  OS with history of vitrectomy for severe membrane peel in the past visual acuity has improved 1 year fu ou oct fp Pt states his vision has been stable but she can tell her vision is not as sharp as it was before Pt denies floaters or FOL  Last edited by Hurman Horn, MD on 10/12/2021 10:27 AM.      Referring physician: Rutherford Guys, [place],  [place] [address]  HISTORICAL INFORMATION:   Selected notes from the MEDICAL RECORD NUMBER    Lab Results  Component Value Date   HGBA1C 5.8 (H) 01/31/2012     CURRENT MEDICATIONS: No current outpatient medications on file. (Ophthalmic Drugs)   No current facility-administered medications for this visit. (Ophthalmic Drugs)   Current Outpatient Medications (Other)  Medication Sig   acetaminophen (TYLENOL) 500 MG tablet Take 1,000 mg by mouth every 6 (six) hours as needed for moderate pain or headache.   ALPRAZolam (XANAX) 0.25 MG tablet Take 0.0625-0.25 mg by mouth 2 (two) times daily as needed for anxiety.    amLODipine (NORVASC) 5 MG tablet Take 5 mg by mouth daily.   apixaban (ELIQUIS) 2.5 MG TABS tablet Take 1 tablet (2.5 mg total) by mouth daily.   citalopram (CELEXA) 10 MG tablet Take 10 mg by mouth daily.   Cyanocobalamin (VITAMIN B-12 IJ) Inject 1 mL as directed every 30 (thirty) days.   docusate sodium (COLACE) 100 MG capsule 1 tab 2 times a day while on narcotics.  STOOL SOFTENER   gabapentin (NEURONTIN) 300 MG capsule Take 1 capsule (300 mg total) by mouth at bedtime. .for nerve pain   metoprolol succinate (TOPROL-XL) 25 MG 24 hr tablet Take 25 mg by mouth daily.     naproxen (NAPROSYN)  500 MG tablet Take 1 tablet (500 mg total) by mouth 2 (two) times daily with a meal.   oxyCODONE (OXY IR/ROXICODONE) 5 MG immediate release tablet Take 1 tablet (5 mg total) by mouth every 4 (four) hours as needed (pain).   polyethylene glycol (MIRALAX / GLYCOLAX) 17 g packet 17grams in 6 oz of something to drink twice a day until bowel movement.  LAXITIVE.  Restart if two days since last bowel movement   pravastatin (PRAVACHOL) 40 MG tablet Take 40 mg by mouth once a week.    No current facility-administered medications for this visit. (Other)   Facility-Administered Medications Ordered in Other Visits (Other)  Medication Route   0.9 %  sodium chloride infusion Intravenous   acetaminophen (TYLENOL) tablet 650 mg Oral   Or   acetaminophen (TYLENOL) suppository 650 mg Rectal      REVIEW OF SYSTEMS: ROS   Negative for: Constitutional, Gastrointestinal, Neurological, Skin, Genitourinary, Musculoskeletal, HENT, Endocrine, Cardiovascular, Eyes, Respiratory, Psychiatric, Allergic/Imm, Heme/Lymph Last edited by Orene Desanctis D, CMA on 10/12/2021  9:22 AM.       ALLERGIES Allergies  Allergen Reactions   Nickel Itching, Swelling and Rash    Possibility...still trying to figure out    Palladium Chloride Other (See Comments)    Positive patch test    Statins Other (  See Comments)    Atorvastatin, pravastatin, simvastatin->myalgias    Bacitracin Rash   Gold Sodium Thiosulfate Rash    PAST MEDICAL HISTORY Past Medical History:  Diagnosis Date   Anemia, normocytic normochromic 01/2012   hx of   Anxiety    takes Xanax daily as needed   Arthritis    knees, hands   Cataract    right eye   Depression    takes Citalopram daily   Dizziness    cardiologist is aware and told pt it was related to meds   History of bronchitis    2014   History of colon polyps    benign   History of migraine    Hyperlipidemia    takes Pravastatin daily   Hypertension    takes Metoprolol and Azor  daily   Joint pain    Pneumonia    hx of-2015   Primary localized osteoarthritis of right knee 10/01/2019   Pseudogout of knee, right 02/22/2017   Renal insufficiency 02/22/2017   Rheumatic fever    hx of   Wheezing on expiration    Past Surgical History:  Procedure Laterality Date   ABDOMINAL HYSTERECTOMY     APPENDECTOMY     CARDIAC CATHETERIZATION     early 2000's   CARDIAC CATHETERIZATION N/A 03/04/2015   Procedure: Left Heart Cath and Coronary Angiography;  Surgeon: Sherren Mocha, MD;  Location: [facility];  Service: Cardiovascular;  Laterality: N/A;   CATARACT EXTRACTION Bilateral    cataract surgery Left    CHOLECYSTECTOMY     CHONDROPLASTY Right 02/21/2017   Procedure: CHONDROPLASTY WITH DEBRIDEMENT;  Surgeon: Elsie Saas, MD;  Location: [facility];  Service: Orthopedics;  Laterality: Right;   COLONOSCOPY  2005   Negative screening study   COLONOSCOPY N/A 04/04/2014   Procedure: COLONOSCOPY;  Surgeon: Rogene Houston, MD;  Location: [facility];  Service: Endoscopy;  Laterality: N/A;  1225   EXPLORATORY LAPAROTOMY     x 3   EYE SURGERY     IR GENERIC HISTORICAL  10/15/2015   IR RADIOLOGIST EVAL & MGMT 10/15/2015 [facility]   IR GENERIC HISTORICAL  10/20/2015   IR KYPHO LUMBAR INC FX REDUCE BONE BX UNI/BIL CANNULATION INC/IMAGING 10/20/2015 Luanne Bras, MD [facility]   IRRIGATION AND DEBRIDEMENT KNEE Right 02/21/2017   Procedure: IRRIGATION AND DEBRIDEMENT KNEE;  Surgeon: Elsie Saas, MD;  Location: [facility];  Service: Orthopedics;  Laterality: Right;   LUMBAR LAMINECTOMY/DECOMPRESSION MICRODISCECTOMY Left 01/24/2018   Procedure: LEFT LUMBAR FOUR- LUMBAR FIVE  LAMINOTOMY AND MICRODISCECTOMY;  Surgeon: Jovita Gamma, MD;  Location: [facility];  Service: Neurosurgery;  Laterality: Left;  LEFT LUMBAR 4- LUMBAR 5  LAMINOTOMY AND MICRODISCECTOMY   TOTAL HIP ARTHROPLASTY Right 12/16/2014   Procedure: TOTAL HIP ARTHROPLASTY ANTERIOR  APPROACH;  Surgeon: Renette Butters, MD;  Location: [facility];  Service: Orthopedics;  Laterality: Right;   TOTAL KNEE ARTHROPLASTY Right 10/11/2019   Procedure: TOTAL KNEE ARTHROPLASTY;  Surgeon: Elsie Saas, MD;  Location: [facility];  Service: Orthopedics;  Laterality: Right;    FAMILY HISTORY Family History  Problem Relation Age of Onset   Heart attack Mother 46       heart problems   Cancer Mother        uterus   Pancreatitis Sister        died  age 50   Colon cancer Maternal Aunt 44   Colon cancer Maternal Aunt 78   Colon  cancer Maternal Aunt 76   Cancer Other        FH   Diabetes Other        FH   Hypertension Other        FH   Diabetes Son    Diabetes Daughter    Colon cancer Maternal Uncle 42    SOCIAL HISTORY Social History   Tobacco Use   Smoking status: Former    Packs/day: 1.00    Years: 15.00    Total pack years: 15.00    Types: Cigarettes   Smokeless tobacco: Never   Tobacco comments:    quit smoking 30+yrs ago  Vaping Use   Vaping Use: Never used  Substance Use Topics   Alcohol use: Yes    Alcohol/week: 3.0 standard drinks of alcohol    Types: 3 Glasses of wine per week    Comment: socially wine   Drug use: No         OPHTHALMIC EXAM:  Base Eye Exam     Visual Acuity (Snellen - Linear)       Right Left   Dist cc 20/50 20/60 +2         Tonometry (Tonopen, 9:29 AM)       Right Left   Pressure 21 13         Pupils       Pupils APD   Right PERRL None   Left PERRL None         Extraocular Movement       Right Left    Ortho Ortho    -- -- --  --  --  -- -- --   -- -- --  --  --  -- -- --           Neuro/Psych     Oriented x3: Yes   Mood/Affect: Normal         Dilation     Both eyes: 1.0% Mydriacyl, 2.5% Phenylephrine @ 9:24 AM           Slit Lamp and Fundus Exam     External Exam       Right Left   External Normal Normal         Slit Lamp Exam       Right Left   Lids/Lashes Normal  Normal   Conjunctiva/Sclera White and quiet White and quiet   Cornea Clear Clear   Anterior Chamber Deep and quiet Deep and quiet   Iris Round and reactive Round and reactive   Lens Centered posterior chamber intraocular lens Centered posterior chamber intraocular lens   Anterior Vitreous Normal Normal         Fundus Exam       Right Left   Posterior Vitreous Posterior vitreous detachment Clear, vitrectomized   Disc Normal Normal   C/D Ratio 0.4 0.4   Macula Soft drusen, Intermediate age related macular degeneration Soft drusen, Intermediate age related macular degeneration,, no topographic distortion remains, Retinal pigment epithelial atrophy in a foveal location which may limit acuity, Retinal pigment epithelial mottling, Hard drusen   Vessels Normal Normal   Periphery Normal Normal            IMAGING AND PROCEDURES  Imaging and Procedures for 10/12/21  OCT, Retina - OU - Both Eyes       Right Eye Quality was good. Scan locations included subfoveal. Central Foveal Thickness: 260. Findings include no SRF, abnormal foveal contour, retinal drusen , subretinal hyper-reflective  material.   Left Eye Scan locations included subfoveal. Central Foveal Thickness: 343. Progression has improved. Findings include no SRF, abnormal foveal contour, retinal drusen , subretinal hyper-reflective material.   Notes OS vastly improved macular thickening post vitrectomy membrane peel for severe epiretinal membrane.   Outer retinal atrophy, drusen deposits may still limits acuity long-term OS.  OD with intermediate ARMD, no signs of CNVM     Color Fundus Photography Optos - OU - Both Eyes       Right Eye Progression has been stable. Disc findings include normal observations. Macula : drusen. Vessels : normal observations. Periphery : normal observations.   Left Eye Progression has been stable. Disc findings include normal observations. Macula : drusen. Vessels : normal observations.  Periphery : normal observations.              ASSESSMENT/PLAN:  Intermediate stage nonexudative age-related macular degeneration of both eyes The nature of age--related macular degeneration was discussed with the patient as well as the distinction between dry and wet types. Checking an Amsler Grid daily with advice to return immediately should a distortion develop, was given to the patient. The patient 's smoking status now and in the past was determined and advice based on the AREDS study was provided regarding the consumption of antioxidant supplements. AREDS 2 vitamin formulation was recommended. Consumption of dark leafy vegetables and fresh fruits of various colors was recommended. Treatment modalities for wet macular degeneration particularly the use of intravitreal injections of anti-blood vessel growth factors was discussed with the patient. Avastin, Lucentis, and Eylea are the available options. On occasion, therapy includes the use of photodynamic therapy and thermal laser. Stressed to the patient do not rub eyes.  Patient was advised to check Amsler Grid daily and return immediately if changes are noted. Instructions on using the grid were given to the patient. All patient questions were answered.  Macular pucker, left eye Stable OS.  Posterior vitreous detachment of right eye No holes or tears OD     ICD-10-CM   1. Intermediate stage nonexudative age-related macular degeneration of both eyes  H35.3132 OCT, Retina - OU - Both Eyes    Color Fundus Photography Optos - OU - Both Eyes    2. Macular pucker, left eye  H35.372     3. Posterior vitreous detachment of right eye  H43.811       1.  OS doing very well.  Intermittent ARMD limits acuity.  2.  ARMD OD.  Patient may continue on 1 pill of AREDS 2 formulation daily and potentially add Lutein.  3.  Ophthalmic Meds Ordered this visit:  No orders of the defined types were placed in this encounter.      Return in about  1 year (around 10/13/2022) for DILATE OU, OCT.  There are no Patient Instructions on file for this visit.   Explained the diagnoses, plan, and follow up with the patient and they expressed understanding.  Patient expressed understanding of the importance of proper follow up care.   Clent Demark Patricia Perales M.D. Diseases & Surgery of the Retina and Vitreous [facility] 10/12/21     Abbreviations: M myopia (nearsighted); A astigmatism; H hyperopia (farsighted); P presbyopia; Mrx spectacle prescription;  CTL contact lenses; OD right eye; OS left eye; OU both eyes  XT exotropia; ET esotropia; PEK punctate epithelial keratitis; PEE punctate epithelial erosions; DES dry eye syndrome; MGD meibomian gland dysfunction; ATs artificial tears; PFAT's preservative free artificial tears; [place] nuclear  sclerotic cataract; PSC posterior subcapsular cataract; ERM epi-retinal membrane; PVD posterior vitreous detachment; RD retinal detachment; DM diabetes mellitus; DR diabetic retinopathy; NPDR non-proliferative diabetic retinopathy; PDR proliferative diabetic retinopathy; CSME clinically significant macular edema; DME diabetic macular edema; dbh dot blot hemorrhages; CWS cotton wool spot; POAG primary open angle glaucoma; C/D cup-to-disc ratio; HVF humphrey visual field; GVF goldmann visual field; OCT optical coherence tomography; IOP intraocular pressure; BRVO Branch retinal vein occlusion; CRVO central retinal vein occlusion; CRAO central retinal artery occlusion; BRAO branch retinal artery occlusion; RT retinal tear; SB scleral buckle; PPV pars plana vitrectomy; VH Vitreous hemorrhage; PRP panretinal laser photocoagulation; IVK intravitreal kenalog; VMT vitreomacular traction; MH Macular hole;  NVD neovascularization of the disc; NVE neovascularization elsewhere; AREDS age related eye disease study; ARMD age related macular degeneration; POAG primary open angle glaucoma; EBMD epithelial/anterior basement  membrane dystrophy; ACIOL anterior chamber intraocular lens; IOL intraocular lens; PCIOL posterior chamber intraocular lens; Phaco/IOL phacoemulsification with intraocular lens placement; [place] photorefractive keratectomy; LASIK laser assisted in situ keratomileusis; HTN hypertension; DM diabetes mellitus; COPD chronic obstructive pulmonary disease

## 2021-10-12 NOTE — Assessment & Plan Note (Signed)

## 2021-10-12 NOTE — Assessment & Plan Note (Signed)
Stable OS 

## 2021-10-12 NOTE — Assessment & Plan Note (Signed)
No holes or tears OD

## 2021-11-15 ENCOUNTER — Ambulatory Visit: Payer: Medicare Other | Admitting: Cardiology

## 2021-11-15 ENCOUNTER — Encounter: Payer: Self-pay | Admitting: Cardiology

## 2021-11-15 NOTE — Progress Notes (Deleted)
Clinical Summary Ms. Vegh is a 81 y.o.female  seen today as a new patient for the following medical problems.      1. Preoperative evaluation - limited by knee pain - highest activity is doing housework. Sweeps, vaccums, mops without issues.  - chronic SOB that is unchanged.      2. History of DOE - negative Lexiscan stress test in 2013 and negative stress echocardiogram in 2014 - cath 2017 without significant disease - no recent chest pains.        Past Medical History:  Diagnosis Date   Anemia, normocytic normochromic 01/2012   hx of   Anxiety    takes Xanax daily as needed   Arthritis    knees, hands   Cataract    right eye   Depression    takes Citalopram daily   Dizziness    cardiologist is aware and told pt it was related to meds   History of bronchitis    2014   History of colon polyps    benign   History of migraine    Hyperlipidemia    takes Pravastatin daily   Hypertension    takes Metoprolol and Azor daily   Joint pain    Pneumonia    hx of-2015   Primary localized osteoarthritis of right knee 10/01/2019   Pseudogout of knee, right 02/22/2017   Renal insufficiency 02/22/2017   Rheumatic fever    hx of   Wheezing on expiration      Allergies  Allergen Reactions   Nickel Itching, Swelling and Rash    Possibility...still trying to figure out    Palladium Chloride Other (See Comments)    Positive patch test    Statins Other (See Comments)    Atorvastatin, pravastatin, simvastatin->myalgias    Bacitracin Rash   Gold Sodium Thiosulfate Rash     Current Outpatient Medications  Medication Sig Dispense Refill   acetaminophen (TYLENOL) 500 MG tablet Take 1,000 mg by mouth every 6 (six) hours as needed for moderate pain or headache.     ALPRAZolam (XANAX) 0.25 MG tablet Take 0.0625-0.25 mg by mouth 2 (two) times daily as needed for anxiety.      amLODipine (NORVASC) 5 MG tablet Take 5 mg by mouth daily.     apixaban (ELIQUIS) 2.5 MG TABS  tablet Take 1 tablet (2.5 mg total) by mouth daily. 30 tablet 0   citalopram (CELEXA) 10 MG tablet Take 10 mg by mouth daily.     Cyanocobalamin (VITAMIN B-12 IJ) Inject 1 mL as directed every 30 (thirty) days.     docusate sodium (COLACE) 100 MG capsule 1 tab 2 times a day while on narcotics.  STOOL SOFTENER 10 capsule 0   gabapentin (NEURONTIN) 300 MG capsule Take 1 capsule (300 mg total) by mouth at bedtime. .for nerve pain 30 capsule 0   metoprolol succinate (TOPROL-XL) 25 MG 24 hr tablet Take 25 mg by mouth daily.       naproxen (NAPROSYN) 500 MG tablet Take 1 tablet (500 mg total) by mouth 2 (two) times daily with a meal. 30 tablet 0   oxyCODONE (OXY IR/ROXICODONE) 5 MG immediate release tablet Take 1 tablet (5 mg total) by mouth every 4 (four) hours as needed (pain). 30 tablet 0   polyethylene glycol (MIRALAX / GLYCOLAX) 17 g packet 17grams in 6 oz of something to drink twice a day until bowel movement.  LAXITIVE.  Restart if two days since last bowel  movement 14 each 0   pravastatin (PRAVACHOL) 40 MG tablet Take 40 mg by mouth once a week.      No current facility-administered medications for this visit.   Facility-Administered Medications Ordered in Other Visits  Medication Dose Route Frequency Provider Last Rate Last Admin   0.9 %  sodium chloride infusion   Intravenous Continuous Aundra Dubin, PA-C       acetaminophen (TYLENOL) tablet 650 mg  650 mg Oral Q6H PRN Aundra Dubin, PA-C       Or   acetaminophen (TYLENOL) suppository 650 mg  650 mg Rectal Q6H PRN Aundra Dubin, PA-C         Past Surgical History:  Procedure Laterality Date   ABDOMINAL HYSTERECTOMY     APPENDECTOMY     CARDIAC CATHETERIZATION     early 2000's   CARDIAC CATHETERIZATION N/A 03/04/2015   Procedure: Left Heart Cath and Coronary Angiography;  Surgeon: Sherren Mocha, MD;  Location: [facility];  Service: Cardiovascular;  Laterality: N/A;   CATARACT EXTRACTION Bilateral    cataract  surgery Left    CHOLECYSTECTOMY     CHONDROPLASTY Right 02/21/2017   Procedure: CHONDROPLASTY WITH DEBRIDEMENT;  Surgeon: Elsie Saas, MD;  Location: [facility];  Service: Orthopedics;  Laterality: Right;   COLONOSCOPY  2005   Negative screening study   COLONOSCOPY N/A 04/04/2014   Procedure: COLONOSCOPY;  Surgeon: Rogene Houston, MD;  Location: [facility];  Service: Endoscopy;  Laterality: N/A;  1225   EXPLORATORY LAPAROTOMY     x 3   EYE SURGERY     IR GENERIC HISTORICAL  10/15/2015   IR RADIOLOGIST EVAL & MGMT 10/15/2015 [facility]   IR GENERIC HISTORICAL  10/20/2015   IR KYPHO LUMBAR INC FX REDUCE BONE BX UNI/BIL CANNULATION INC/IMAGING 10/20/2015 Luanne Bras, MD [facility]   IRRIGATION AND DEBRIDEMENT KNEE Right 02/21/2017   Procedure: IRRIGATION AND DEBRIDEMENT KNEE;  Surgeon: Elsie Saas, MD;  Location: [facility];  Service: Orthopedics;  Laterality: Right;   LUMBAR LAMINECTOMY/DECOMPRESSION MICRODISCECTOMY Left 01/24/2018   Procedure: LEFT LUMBAR FOUR- LUMBAR FIVE  LAMINOTOMY AND MICRODISCECTOMY;  Surgeon: Jovita Gamma, MD;  Location: [facility];  Service: Neurosurgery;  Laterality: Left;  LEFT LUMBAR 4- LUMBAR 5  LAMINOTOMY AND MICRODISCECTOMY   TOTAL HIP ARTHROPLASTY Right 12/16/2014   Procedure: TOTAL HIP ARTHROPLASTY ANTERIOR APPROACH;  Surgeon: Renette Butters, MD;  Location: [facility];  Service: Orthopedics;  Laterality: Right;   TOTAL KNEE ARTHROPLASTY Right 10/11/2019   Procedure: TOTAL KNEE ARTHROPLASTY;  Surgeon: Elsie Saas, MD;  Location: [facility];  Service: Orthopedics;  Laterality: Right;     Allergies  Allergen Reactions   Nickel Itching, Swelling and Rash    Possibility...still trying to figure out    Palladium Chloride Other (See Comments)    Positive patch test    Statins Other (See Comments)    Atorvastatin, pravastatin, simvastatin->myalgias    Bacitracin Rash   Gold Sodium Thiosulfate Rash      Family History   Problem Relation Age of Onset   Heart attack Mother 31       heart problems   Cancer Mother        uterus   Pancreatitis Sister        died  age 97   Colon cancer Maternal Aunt 31   Colon cancer Maternal Aunt 78   Colon cancer Maternal Aunt 76   Cancer Other  FH   Diabetes Other        FH   Hypertension Other        FH   Diabetes Son    Diabetes Daughter    Colon cancer Maternal Uncle 78     Social History Ms. Benegas reports that she has quit smoking. She has a 15.00 pack-year smoking history. She has never used smokeless tobacco. Ms. Riemenschneider reports current alcohol use of about 3.0 standard drinks of alcohol per week.   Review of Systems CONSTITUTIONAL: No weight loss, fever, chills, weakness or fatigue.  HEENT: Eyes: No visual loss, blurred vision, double vision or yellow sclerae.No hearing loss, sneezing, congestion, runny nose or sore throat.  SKIN: No rash or itching.  CARDIOVASCULAR:  RESPIRATORY: No shortness of breath, cough or sputum.  GASTROINTESTINAL: No anorexia, nausea, vomiting or diarrhea. No abdominal pain or blood.  GENITOURINARY: No burning on urination, no polyuria NEUROLOGICAL: No headache, dizziness, syncope, paralysis, ataxia, numbness or tingling in the extremities. No change in bowel or bladder control.  MUSCULOSKELETAL: No muscle, back pain, joint pain or stiffness.  LYMPHATICS: No enlarged nodes. No history of splenectomy.  PSYCHIATRIC: No history of depression or anxiety.  ENDOCRINOLOGIC: No reports of sweating, cold or heat intolerance. No polyuria or polydipsia.  Marland Kitchen   Physical Examination There were no vitals filed for this visit. There were no vitals filed for this visit.  Gen: resting comfortably, no acute distress HEENT: no scleral icterus, pupils equal round and reactive, no palptable cervical adenopathy,  CV Resp: Clear to auscultation bilaterally GI: abdomen is soft, non-tender, non-distended, normal bowel sounds, no  hepatosplenomegaly MSK: extremities are warm, no edema.  Skin: warm, no rash Neuro:  no focal deficits Psych: appropriate affect   Diagnostic Studies Jan 2017 cath 1. Minor nonobstructive CAD as detailed below with calcified coronary arteries but no obstruction 2. Normal LV function    Assessment and Plan  1. Preoperative evaluation - exertion is limited by chronic knee pains. Tolerates mild to moderate house work without issues - extensive cardiac testing over the years, multiple negative stress tests along with a cath in 2017 without significant disease - no significaht chest pains, chronic SOB that is unchanged over several years - given normal cath in 2017 without new significant symptoms would recommend proceeding with surgery as planned. No plans for repeat cardiac testing prior to surgery      Arnoldo Lenis, M.D., F.A.C.C.

## 2021-11-22 ENCOUNTER — Other Ambulatory Visit (HOSPITAL_COMMUNITY): Payer: Self-pay | Admitting: Internal Medicine

## 2021-11-22 DIAGNOSIS — Z1231 Encounter for screening mammogram for malignant neoplasm of breast: Secondary | ICD-10-CM

## 2021-12-01 ENCOUNTER — Ambulatory Visit (HOSPITAL_COMMUNITY)
Admission: RE | Admit: 2021-12-01 | Discharge: 2021-12-01 | Disposition: A | Discharge status: Home / Self Care | Payer: Medicare Other | Source: Ambulatory Visit | Attending: Internal Medicine | Admitting: Internal Medicine

## 2021-12-01 DIAGNOSIS — Z1231 Encounter for screening mammogram for malignant neoplasm of breast: Secondary | ICD-10-CM | POA: Insufficient documentation

## 2021-12-07 DIAGNOSIS — F0150 Vascular dementia without behavioral disturbance: Secondary | ICD-10-CM | POA: Diagnosis not present

## 2021-12-07 DIAGNOSIS — F411 Generalized anxiety disorder: Secondary | ICD-10-CM | POA: Diagnosis not present

## 2021-12-07 DIAGNOSIS — I10 Essential (primary) hypertension: Secondary | ICD-10-CM | POA: Diagnosis not present

## 2021-12-07 DIAGNOSIS — Z23 Encounter for immunization: Secondary | ICD-10-CM | POA: Diagnosis not present

## 2022-01-18 DIAGNOSIS — L299 Pruritus, unspecified: Secondary | ICD-10-CM | POA: Diagnosis not present

## 2022-02-10 ENCOUNTER — Other Ambulatory Visit: Payer: Self-pay

## 2022-02-10 ENCOUNTER — Emergency Department (HOSPITAL_COMMUNITY)
Admission: EM | Admit: 2022-02-10 | Discharge: 2022-02-10 | Disposition: A | Discharge status: Home / Self Care | Payer: Medicare Other | Attending: Emergency Medicine | Admitting: Emergency Medicine

## 2022-02-10 ENCOUNTER — Emergency Department (HOSPITAL_COMMUNITY): Payer: Medicare Other

## 2022-02-10 ENCOUNTER — Encounter (HOSPITAL_COMMUNITY): Payer: Self-pay

## 2022-02-10 DIAGNOSIS — E876 Hypokalemia: Secondary | ICD-10-CM | POA: Diagnosis not present

## 2022-02-10 DIAGNOSIS — Z20822 Contact with and (suspected) exposure to covid-19: Secondary | ICD-10-CM | POA: Diagnosis not present

## 2022-02-10 DIAGNOSIS — N3000 Acute cystitis without hematuria: Secondary | ICD-10-CM | POA: Diagnosis not present

## 2022-02-10 DIAGNOSIS — Z7982 Long term (current) use of aspirin: Secondary | ICD-10-CM | POA: Insufficient documentation

## 2022-02-10 DIAGNOSIS — Z1152 Encounter for screening for COVID-19: Secondary | ICD-10-CM | POA: Insufficient documentation

## 2022-02-10 DIAGNOSIS — R1110 Vomiting, unspecified: Secondary | ICD-10-CM | POA: Diagnosis not present

## 2022-02-10 DIAGNOSIS — R197 Diarrhea, unspecified: Secondary | ICD-10-CM | POA: Diagnosis not present

## 2022-02-10 LAB — CBC
HCT: 38.1 % (ref 36.0–46.0)
Hemoglobin: 12.9 g/dL (ref 12.0–15.0)
MCH: 31.1 pg (ref 26.0–34.0)
MCHC: 33.9 g/dL (ref 30.0–36.0)
MCV: 91.8 fL (ref 80.0–100.0)
Platelets: 329 10*3/uL (ref 150–400)
RBC: 4.15 MIL/uL (ref 3.87–5.11)
RDW: 11.7 % (ref 11.5–15.5)
WBC: 6.6 10*3/uL (ref 4.0–10.5)
nRBC: 0 % (ref 0.0–0.2)

## 2022-02-10 LAB — COMPREHENSIVE METABOLIC PANEL
ALT: 16 U/L (ref 0–44)
AST: 26 U/L (ref 15–41)
Albumin: 3.9 g/dL (ref 3.5–5.0)
Alkaline Phosphatase: 94 U/L (ref 38–126)
Anion gap: 9 (ref 5–15)
BUN: 44 mg/dL — ABNORMAL HIGH (ref 8–23)
CO2: 23 mmol/L (ref 22–32)
Calcium: 9.1 mg/dL (ref 8.9–10.3)
Chloride: 105 mmol/L (ref 98–111)
Creatinine, Ser: 1.03 mg/dL — ABNORMAL HIGH (ref 0.44–1.00)
GFR, Estimated: 54 mL/min — ABNORMAL LOW (ref >60)
Glucose, Bld: 115 mg/dL — ABNORMAL HIGH (ref 70–99)
Potassium: 3.3 mmol/L — ABNORMAL LOW (ref 3.5–5.1)
Sodium: 137 mmol/L (ref 135–145)
Total Bilirubin: 0.5 mg/dL (ref 0.3–1.2)
Total Protein: 7.6 g/dL (ref 6.5–8.1)

## 2022-02-10 LAB — URINALYSIS, ROUTINE W REFLEX MICROSCOPIC
Bilirubin Urine: NEGATIVE
Glucose, UA: NEGATIVE mg/dL
Ketones, ur: 5 mg/dL — AB
Nitrite: NEGATIVE
Protein, ur: 30 mg/dL — AB
Specific Gravity, Urine: 1.021 (ref 1.005–1.030)
Trans Epithel, UA: 2
WBC, UA: >50 WBC/hpf — ABNORMAL HIGH (ref 0–5)
pH: 5 (ref 5.0–8.0)

## 2022-02-10 LAB — RESP PANEL BY RT-PCR (RSV, FLU A&B, COVID)  RVPGX2
Influenza A by PCR: NEGATIVE
Influenza B by PCR: NEGATIVE
Resp Syncytial Virus by PCR: NEGATIVE
SARS Coronavirus 2 by RT PCR: NEGATIVE

## 2022-02-10 LAB — LIPASE, BLOOD: Lipase: 50 U/L (ref 11–51)

## 2022-02-10 MED ORDER — ONDANSETRON HCL 4 MG/2ML IJ SOLN
4.0000 mg | Freq: Once | INTRAMUSCULAR | Status: AC
  Administered 2022-02-10: 4 mg via INTRAVENOUS
  Filled 2022-02-10: qty 2

## 2022-02-10 MED ORDER — DICYCLOMINE HCL 20 MG PO TABS: 20.0000 mg | ORAL_TABLET | Freq: Two times a day (BID) | ORAL | 0 refills | Status: DC | PRN

## 2022-02-10 MED ORDER — ONDANSETRON HCL 4 MG PO TABS: 4.0000 mg | ORAL_TABLET | Freq: Three times a day (TID) | ORAL | 0 refills | Status: DC | PRN

## 2022-02-10 MED ORDER — CEPHALEXIN 500 MG PO CAPS
500.0000 mg | ORAL_CAPSULE | Freq: Once | ORAL | Status: AC
  Administered 2022-02-10: 500 mg via ORAL
  Filled 2022-02-10: qty 1

## 2022-02-10 MED ORDER — DICYCLOMINE HCL 10 MG/ML IM SOLN
20.0000 mg | Freq: Once | INTRAMUSCULAR | Status: AC
  Administered 2022-02-10: 20 mg via INTRAMUSCULAR
  Filled 2022-02-10: qty 2

## 2022-02-10 MED ORDER — SODIUM CHLORIDE 0.9 % IV BOLUS
1000.0000 mL | Freq: Once | INTRAVENOUS | Status: AC
  Administered 2022-02-10: 1000 mL via INTRAVENOUS

## 2022-02-10 MED ORDER — POTASSIUM CHLORIDE ER 10 MEQ PO TBCR
10.0000 meq | EXTENDED_RELEASE_TABLET | Freq: Every day | ORAL | 0 refills | Status: DC
Start: 2022-02-10 — End: 2023-08-24

## 2022-02-10 MED ORDER — IOHEXOL 300 MG/ML  SOLN
100.0000 mL | Freq: Once | INTRAMUSCULAR | Status: AC | PRN
  Administered 2022-02-10: 100 mL via INTRAVENOUS

## 2022-02-10 MED ORDER — CEPHALEXIN 500 MG PO CAPS: 500.0000 mg | ORAL_CAPSULE | Freq: Two times a day (BID) | ORAL | 0 refills | Status: AC

## 2022-02-10 NOTE — ED Triage Notes (Signed)
Patient states nauseous 3-4 days with diarrhea and vomiting. Patient denies sick exposure. Per patient foul smelling diarrhea. Patient denies abdominal pain. Patient states itching on back due to a rash and had same rash between thighs.

## 2022-02-10 NOTE — ED Provider Triage Note (Signed)
Emergency Medicine Provider Triage Evaluation Note  Mackenzie [name] , a 81 y.o. female  was evaluated in triage.  Pt complains of nausea, vomiting, diarrhea, rash and generalized weakness.  Symptoms have been present for 3 to 4 days.  Gradually worsening.  States she has very foul-smelling diarrhea that is watery and brown in color.  No known sick contacts.  Denies any new medications, fever, chills, abdominal pain and recent antibiotics.  Rash is present on both arms, trunk, and both thighs.  Describes as itchy nonpainful.  Review of Systems  Positive: Nausea, vomiting, diarrhea, rash Negative: Fever, chills, pain of the abdomen or chest.  Physical Exam  BP (!) 156/59 (BP Location: Left Arm)   Pulse 88   Temp 98.1 F (36.7 C) (Oral)   Resp 19   Ht 5' (1.524 m)   Wt 45.4 kg   SpO2 100%   BMI 19.53 kg/m  Gen:   Awake, no distress frail-appearing Resp:  Normal effort  MSK:   Moves extremities without difficulty  Other:  Rash  Medical Decision Making  Medically screening exam initiated at 1:11 PM.  Appropriate orders placed.  Mackenzie [name] was informed that the remainder of the evaluation will be completed by another provider, this initial triage assessment does not replace that evaluation, and the importance of remaining in the ED until their evaluation is complete.     Mackenzie Parkinson, PA-C 02/10/22 1341

## 2022-02-10 NOTE — ED Notes (Signed)
Not in room. Family in room.

## 2022-02-10 NOTE — ED Notes (Addendum)
EDP at [facility]. Family at [facility]. PT alert, NAD, calm, interactive.

## 2022-02-10 NOTE — ED Provider Notes (Signed)
[facility] EMERGENCY DEPARTMENT Provider Note   CSN: [id number] Arrival date & time: 02/10/22  1044     History  Chief Complaint  Patient presents with   Diarrhea    Mackenzie [name] is a 81 y.o. female presenting to the ER with nausea vomiting diarrhea for 4 days.  She reports she is having difficulty keeping anything down and she feels diarrhea is "running out of me".  She reports cramping abdominal pain.  She denies cough, congestion, sore throat, sick contacts.  Reports she has been living by herself.  Her son has been trying to check in on her, and is also present at the bedside.  She denies prior history of C. difficile or any recent antibiotic usage.  Says the diarrhea is nonbloody  She has a history of a total hysterectomy and cholecystectomy  HPI     Home Medications Prior to Admission medications   Medication Sig Start Date End Date Taking? Authorizing Provider  acetaminophen (TYLENOL) 500 MG tablet Take 1,000 mg by mouth every 6 (six) hours as needed for moderate pain or headache.   Yes [provider]  ALPRAZolam Duanne Moron) 0.25 MG tablet Take 0.0625-0.25 mg by mouth 2 (two) times daily as needed for anxiety.  07/26/19  Yes [provider]  amLODipine (NORVASC) 5 MG tablet Take 5 mg by mouth daily. 07/13/19  Yes [provider]  aspirin EC 81 MG tablet Take 81 mg by mouth daily. Swallow whole.   Yes [provider]  cephALEXin (KEFLEX) 500 MG capsule Take 1 capsule (500 mg total) by mouth 2 (two) times daily for 5 days. 02/10/22 02/15/22 Yes Keiden Deskin, Carola Rhine, MD  chlorthalidone (HYGROTON) 25 MG tablet Take 25 mg by mouth daily.   Yes [provider]  citalopram (CELEXA) 10 MG tablet Take 10 mg by mouth daily.   Yes [provider]  Colchicine 0.6 MG CAPS Take 1 capsule by mouth daily as needed (gout). 01/23/19  Yes [provider]  Cyanocobalamin (VITAMIN B-12 IJ) Inject 1 mL as directed every 30 (thirty) days.   Yes  [provider]  dicyclomine (BENTYL) 20 MG tablet Take 1 tablet (20 mg total) by mouth 2 (two) times daily as needed for up to 15 doses for spasms. 02/10/22  Yes Wyvonnia Dusky, MD  docusate sodium (COLACE) 100 MG capsule 1 tab 2 times a day while on narcotics.  STOOL SOFTENER 10/12/19  Yes Shepperson, Kirstin, PA-C  donepezil (ARICEPT) 5 MG tablet Take 5 mg by mouth daily. 01/18/22  Yes [provider]  metoprolol succinate (TOPROL-XL) 25 MG 24 hr tablet Take 25 mg by mouth daily.     Yes [provider]  ondansetron (ZOFRAN) 4 MG tablet Take 1 tablet (4 mg total) by mouth every 8 (eight) hours as needed for up to 12 doses for nausea or vomiting. 02/10/22  Yes Valita Righter, Carola Rhine, MD  polyethylene glycol (MIRALAX / GLYCOLAX) 17 g packet 17grams in 6 oz of something to drink twice a day until bowel movement.  LAXITIVE.  Restart if two days since last bowel movement 10/12/19  Yes Shepperson, Kirstin, PA-C  potassium chloride (KLOR-CON) 10 MEQ tablet Take 1 tablet (10 mEq total) by mouth daily for 20 days. 02/10/22 03/02/22 Yes Malloree Raboin, Carola Rhine, MD  pravastatin (PRAVACHOL) 40 MG tablet Take 40 mg by mouth at bedtime.   Yes [provider]      Allergies    Nickel, Palladium chloride, Statins, Bacitracin, and  Gold sodium thiosulfate    Review of Systems   Review of Systems  Physical Exam Updated Vital Signs BP (!) 134/42 (BP Location: Left Arm)   Pulse 78   Temp 98.1 F (36.7 C) (Oral)   Resp 16   Ht 5' (1.524 m)   Wt 45.4 kg   SpO2 98%   BMI 19.53 kg/m  Physical Exam Constitutional:      General: She is not in acute distress. HENT:     Head: Normocephalic and atraumatic.  Eyes:     Conjunctiva/sclera: Conjunctivae normal.     Pupils: Pupils are equal, round, and reactive to light.  Cardiovascular:     Rate and Rhythm: Normal rate and regular rhythm.  Pulmonary:     Effort: Pulmonary effort is normal. No respiratory distress.  Abdominal:      General: There is no distension.     Tenderness: There is no abdominal tenderness.  Skin:    General: Skin is warm and dry.  Neurological:     General: No focal deficit present.     Mental Status: She is alert. Mental status is at baseline.  Psychiatric:        Mood and Affect: Mood normal.        Behavior: Behavior normal.     ED Results / Procedures / Treatments   Labs (all labs ordered are listed, but only abnormal results are displayed) Labs Reviewed  COMPREHENSIVE METABOLIC PANEL - Abnormal; Notable for the following components:      Result Value   Potassium 3.3 (*)    Glucose, Bld 115 (*)    BUN 44 (*)    Creatinine, Ser 1.03 (*)    GFR, Estimated 54 (*)    All other components within normal limits  URINALYSIS, ROUTINE W REFLEX MICROSCOPIC - Abnormal; Notable for the following components:   APPearance HAZY (*)    Hgb urine dipstick SMALL (*)    Ketones, ur 5 (*)    Protein, ur 30 (*)    Leukocytes,Ua LARGE (*)    WBC, UA >50 (*)    Bacteria, UA RARE (*)    All other components within normal limits  RESP PANEL BY RT-PCR (RSV, FLU A&B, COVID)  [id number]  GASTROINTESTINAL PANEL BY PCR, STOOL (REPLACES STOOL CULTURE)  LIPASE, BLOOD  CBC    EKG None  Radiology CT ABDOMEN PELVIS W CONTRAST  Result Date: 02/10/2022 CLINICAL DATA:  Diarrhea.  Nausea and vomiting EXAM: CT ABDOMEN AND PELVIS WITH CONTRAST TECHNIQUE: Multidetector CT imaging of the abdomen and pelvis was performed using the standard protocol following bolus administration of intravenous contrast. RADIATION DOSE REDUCTION: This exam was performed according to the departmental dose-optimization program which includes automated exposure control, adjustment of the mA and/or kV according to patient size and/or use of iterative reconstruction technique. CONTRAST:  131m OMNIPAQUE IOHEXOL 300 MG/ML  SOLN COMPARISON:  CT pelvis 12/06/2019 FINDINGS: Lower chest: No acute abnormality. Hepatobiliary: No focal liver  abnormality is seen. Status post cholecystectomy. Mild intrahepatic biliary dilatation is likely postsurgical. Pancreas: Unremarkable. No pancreatic ductal dilatation or surrounding inflammatory changes. Spleen: Punctate calcified granulomas.  No splenomegaly. Adrenals/Urinary Tract: Unremarkable adrenal glands. Kidneys enhance symmetrically without solid lesion, stone, or hydronephrosis. Ureters are nondilated. Urinary bladder is obscured by streak artifact from patient's hip prosthesis. Stomach/Bowel: Stomach is within normal limits. Appendix is not visualized, surgically absent by history. Sigmoid diverticulosis. No evidence of bowel wall thickening, distention, or inflammatory changes. Vascular/Lymphatic: Extensive aortoiliac atherosclerotic calcifications without  aneurysm. Bulky calcifications at the origins of the celiac, superior mesenteric, and bilateral renal arteries. No abdominopelvic lymphadenopathy. Reproductive: Status post hysterectomy. No adnexal masses. Other: No free fluid. No abdominopelvic fluid collection. No pneumoperitoneum. No abdominal wall hernia. Musculoskeletal: Prior right total hip arthroplasty. Prior cement augmentation of a L1 vertebral body compression fracture. No acute bony findings. There is a small collection of air within the subcutaneous soft tissues overlying the superolateral left gluteal region without adjacent fat stranding or fluid. IMPRESSION: 1. No acute abdominopelvic findings. 2. Sigmoid diverticulosis without evidence of acute diverticulitis. 3. There is a small collection of air within the subcutaneous soft tissues overlying the superolateral left gluteal region without adjacent fat stranding or fluid. This could represent injection-related changes. Correlate with patient history. 4. 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 Electronically Signed   By: Davina Poke D.O.   On: 02/10/2022 18:22    Procedures Procedures    Medications Ordered in  ED Medications  dicyclomine (BENTYL) injection 20 mg (20 mg Intramuscular Given 02/10/22 1737)  ondansetron (ZOFRAN) injection 4 mg (4 mg Intravenous Given 02/10/22 1734)  sodium chloride 0.9 % bolus 1,000 mL (0 mLs Intravenous Stopped 02/10/22 1825)  iohexol (OMNIPAQUE) 300 MG/ML solution 100 mL (100 mLs Intravenous Contrast Given 02/10/22 1749)  cephALEXin (KEFLEX) capsule 500 mg (500 mg Oral Given 02/10/22 2103)    ED Course/ Medical Decision Making/ A&P Clinical Course as of 02/10/22 2114  Thu Feb 10, 2022  1858 Patient is feeling better has not been able to produce a bowel movement in nearly 8 hours in the ED.  Per her recollection and her sons it seems to be significant improvement of her diarrhea.  I do not see an emergent issues on her CT imaging.  She has very mild hypokalemia.  We could discharge her on potassium and Bentyl at home.  We did discuss sending stool studies before she is discharged, with a small possibility of a possible infectious GI cause for her diarrhea.  Without a confirmed positive result I would not start her on antibiotics now, which are likely to cause side effects including diarrhea.  She is going to attempt to provide a stool sample.  We are finishing her fluids. [MT]    Clinical Course User Index [MT] Eh Sauseda, Carola Rhine, MD                           Medical Decision Making Amount and/or Complexity of Data Reviewed Labs: ordered. Radiology: ordered.  Risk Prescription drug management.   This patient presents to the Emergency Department with complaint of abdominal pain. This involves an extensive number of treatment options, and is a complaint that carries with it a high risk of complications and morbidity.  The differential diagnosis includes, but is not limited to, infectious colitis versus inflammatory colitis versus diverticulitis versus viral gastroenteritis versus other  I ordered, reviewed, and interpreted labs, notable for mild hypokalemia.  No  leukocytosis. I ordered medication Bentyl, fluids, Zofran for abdominal pain and/or nausea I ordered imaging studies which included CT abdomen pelvis I independently visualized and interpreted imaging which showed no acute emergent findings, incidental diverticulosis without inflammation.  And the monitor tracing which showed normal sinus rhythm Additional history was obtained from patient's son at bedside  After the interventions stated above, I reevaluated the patient and found that they remained clinically stable.  Based on the patient's clinical exam, vital signs, risk factors, and ED testing, I felt that  the patient's overall risk of life-threatening emergency such as bowel perforation, surgical emergency, or sepsis was quite low.  I suspect this clinical presentation is most consistent with nonspecific diarrhea, but explained to the patient that this evaluation was not a definitive diagnostic workup.  I discussed outpatient follow up with primary care provider, and provided specialist office number on the patient's discharge paper if a referral was deemed necessary.  I discussed return precautions with the patient. I felt the patient was clinically stable for discharge.         Final Clinical Impression(s) / ED Diagnoses Final diagnoses:  Hypokalemia  Acute cystitis without hematuria  Diarrhea, unspecified type    Rx / DC Orders ED Discharge Orders          Ordered    potassium chloride (KLOR-CON) 10 MEQ tablet  Daily        02/10/22 2114    dicyclomine (BENTYL) 20 MG tablet  2 times daily PRN        02/10/22 2114    cephALEXin (KEFLEX) 500 MG capsule  2 times daily        02/10/22 2114    ondansetron (ZOFRAN) 4 MG tablet  Every 8 hours PRN        02/10/22 2114              Wyvonnia Dusky, MD 02/10/22 2114

## 2022-03-30 DIAGNOSIS — R21 Rash and other nonspecific skin eruption: Secondary | ICD-10-CM | POA: Diagnosis not present

## 2022-03-30 DIAGNOSIS — F419 Anxiety disorder, unspecified: Secondary | ICD-10-CM | POA: Diagnosis not present

## 2022-04-19 DIAGNOSIS — F419 Anxiety disorder, unspecified: Secondary | ICD-10-CM | POA: Diagnosis not present

## 2022-04-19 DIAGNOSIS — R21 Rash and other nonspecific skin eruption: Secondary | ICD-10-CM | POA: Diagnosis not present

## 2022-05-10 DIAGNOSIS — R21 Rash and other nonspecific skin eruption: Secondary | ICD-10-CM | POA: Diagnosis not present

## 2022-05-10 DIAGNOSIS — M545 Low back pain, unspecified: Secondary | ICD-10-CM | POA: Diagnosis not present

## 2022-05-26 ENCOUNTER — Emergency Department (HOSPITAL_COMMUNITY): Payer: Medicare Other

## 2022-05-26 ENCOUNTER — Emergency Department (HOSPITAL_COMMUNITY)
Admission: EM | Admit: 2022-05-26 | Discharge: 2022-05-26 | Disposition: A | Discharge status: Home / Self Care | Payer: Medicare Other | Attending: Emergency Medicine | Admitting: Emergency Medicine

## 2022-05-26 ENCOUNTER — Encounter (HOSPITAL_COMMUNITY): Payer: Self-pay

## 2022-05-26 ENCOUNTER — Other Ambulatory Visit: Payer: Self-pay

## 2022-05-26 DIAGNOSIS — R0602 Shortness of breath: Secondary | ICD-10-CM | POA: Insufficient documentation

## 2022-05-26 DIAGNOSIS — R112 Nausea with vomiting, unspecified: Secondary | ICD-10-CM | POA: Insufficient documentation

## 2022-05-26 DIAGNOSIS — R0789 Other chest pain: Secondary | ICD-10-CM | POA: Insufficient documentation

## 2022-05-26 DIAGNOSIS — Z79899 Other long term (current) drug therapy: Secondary | ICD-10-CM | POA: Insufficient documentation

## 2022-05-26 DIAGNOSIS — R5383 Other fatigue: Secondary | ICD-10-CM | POA: Insufficient documentation

## 2022-05-26 DIAGNOSIS — Z7982 Long term (current) use of aspirin: Secondary | ICD-10-CM | POA: Insufficient documentation

## 2022-05-26 DIAGNOSIS — R079 Chest pain, unspecified: Secondary | ICD-10-CM

## 2022-05-26 DIAGNOSIS — I10 Essential (primary) hypertension: Secondary | ICD-10-CM | POA: Insufficient documentation

## 2022-05-26 LAB — CBC
HCT: 35.9 % — ABNORMAL LOW (ref 36.0–46.0)
Hemoglobin: 12 g/dL (ref 12.0–15.0)
MCH: 31 pg (ref 26.0–34.0)
MCHC: 33.4 g/dL (ref 30.0–36.0)
MCV: 92.8 fL (ref 80.0–100.0)
Platelets: 394 10*3/uL (ref 150–400)
RBC: 3.87 MIL/uL (ref 3.87–5.11)
RDW: 11.8 % (ref 11.5–15.5)
WBC: 8.3 10*3/uL (ref 4.0–10.5)
nRBC: 0 % (ref 0.0–0.2)

## 2022-05-26 LAB — BASIC METABOLIC PANEL
Anion gap: 10 (ref 5–15)
BUN: 26 mg/dL — ABNORMAL HIGH (ref 8–23)
CO2: 27 mmol/L (ref 22–32)
Calcium: 8.9 mg/dL (ref 8.9–10.3)
Chloride: 98 mmol/L (ref 98–111)
Creatinine, Ser: 0.84 mg/dL (ref 0.44–1.00)
GFR, Estimated: >60 mL/min (ref >60)
Glucose, Bld: 107 mg/dL — ABNORMAL HIGH (ref 70–99)
Potassium: 3.4 mmol/L — ABNORMAL LOW (ref 3.5–5.1)
Sodium: 135 mmol/L (ref 135–145)

## 2022-05-26 LAB — D-DIMER, QUANTITATIVE: D-Dimer, Quant: 0.71 ug/mL-FEU — ABNORMAL HIGH (ref 0.00–0.50)

## 2022-05-26 LAB — TROPONIN I (HIGH SENSITIVITY)
Troponin I (High Sensitivity): 5 ng/L (ref <18)
Troponin I (High Sensitivity): 6 ng/L (ref <18)

## 2022-05-26 MED ORDER — METOPROLOL SUCCINATE ER 25 MG PO TB24: 12.5000 mg | ORAL_TABLET | Freq: Every day | ORAL | 0 refills | Status: AC

## 2022-05-26 NOTE — ED Triage Notes (Signed)
Pt presents to ED with chest pain and sob started 2-3 days ago, has been intermittent, but pain got worse today, pt says she thinks it might be stress related due to death of daughter last week. Pt has taken 0.25 xanax about 45 minutes before arrival.

## 2022-05-26 NOTE — ED Provider Notes (Signed)
[facility] EMERGENCY DEPARTMENT AT [facility] Provider Note   CSN: [id number] Arrival date & time: 05/26/22  1937     History  Chief Complaint  Patient presents with   Chest Pain    Mackenzie [name] is a 82 y.o. female.  82 year old female with a history of anxiety, hypertension, and hyperlipidemia who presents to the emergency department with chest discomfort.  Patient reports that she has left-sided chest discomfort that radiates to her back.  Describes it as a pressure-like sensation.  Worse with taking a deep breath.  Also with some mild shortness of breath and fatigue.  No fever or cough.  Has had nausea and vomiting today but denies any diaphoresis.  No personal history of MI.  Mother had a heart attack at the age of 27.  Is not on any blood thinners right now.  Her daughter did die recently and she thinks that it might be due to the stress of her daughter passing.       Home Medications Prior to Admission medications   Medication Sig Start Date End Date Taking? Authorizing Provider  acetaminophen (TYLENOL) 500 MG tablet Take 1,000 mg by mouth every 6 (six) hours as needed for moderate pain or headache.    [provider]  ALPRAZolam Prudy Feeler) 0.25 MG tablet Take 0.0625-0.25 mg by mouth 2 (two) times daily as needed for anxiety.  07/26/19   [provider]  amLODipine (NORVASC) 5 MG tablet Take 5 mg by mouth daily. 07/13/19   [provider]  aspirin EC 81 MG tablet Take 81 mg by mouth daily. Swallow whole.    [provider]  chlorthalidone (HYGROTON) 25 MG tablet Take 25 mg by mouth daily.    [provider]  citalopram (CELEXA) 10 MG tablet Take 10 mg by mouth daily.    [provider]  Colchicine 0.6 MG CAPS Take 1 capsule by mouth daily as needed (gout). 01/23/19   [provider]  Cyanocobalamin (VITAMIN B-12 IJ) Inject 1 mL as directed every 30 (thirty) days.    [provider]  dicyclomine  (BENTYL) 20 MG tablet Take 1 tablet (20 mg total) by mouth 2 (two) times daily as needed for up to 15 doses for spasms. 02/10/22   Terald Sleeper, MD  docusate sodium (COLACE) 100 MG capsule 1 tab 2 times a day while on narcotics.  STOOL SOFTENER 10/12/19   Shepperson, Kirstin, PA-C  donepezil (ARICEPT) 5 MG tablet Take 5 mg by mouth daily. 01/18/22   [provider]  metoprolol succinate (TOPROL-XL) 25 MG 24 hr tablet Take 0.5 tablets (12.5 mg total) by mouth daily. 05/26/22   Rondel Baton, MD  ondansetron (ZOFRAN) 4 MG tablet Take 1 tablet (4 mg total) by mouth every 8 (eight) hours as needed for up to 12 doses for nausea or vomiting. 02/10/22   Terald Sleeper, MD  polyethylene glycol (MIRALAX / GLYCOLAX) 17 g packet 17grams in 6 oz of something to drink twice a day until bowel movement.  LAXITIVE.  Restart if two days since last bowel movement 10/12/19   Shepperson, Kirstin, PA-C  potassium chloride (KLOR-CON) 10 MEQ tablet Take 1 tablet (10 mEq total) by mouth daily for 20 days. 02/10/22 03/02/22  Terald Sleeper, MD  pravastatin (PRAVACHOL) 40 MG tablet Take 40 mg by mouth at bedtime.    [provider]      Allergies    Nickel, Palladium chloride, Statins, Bacitracin, and Gold  sodium thiosulfate    Review of Systems   Review of Systems  Physical Exam Updated Vital Signs BP (!) 149/79   Pulse (!) 44   Temp 98 F (36.7 C) (Oral)   Resp 16   Ht 5' (1.524 m)   Wt 48.1 kg   SpO2 98%   BMI 20.70 kg/m  Physical Exam Vitals and nursing note reviewed.  Constitutional:      General: She is not in acute distress.    Appearance: She is well-developed.  HENT:     Head: Normocephalic and atraumatic.     Right Ear: External ear normal.     Left Ear: External ear normal.     Nose: Nose normal.  Eyes:     Extraocular Movements: Extraocular movements intact.     Conjunctiva/sclera: Conjunctivae normal.     Pupils: Pupils are equal, round, and reactive to light.   Cardiovascular:     Rate and Rhythm: Regular rhythm. Bradycardia present.     Heart sounds: No murmur heard. Pulmonary:     Effort: Pulmonary effort is normal. No respiratory distress.     Breath sounds: Normal breath sounds.  Abdominal:     General: Abdomen is flat. There is no distension.     Palpations: Abdomen is soft. There is no mass.     Tenderness: There is no abdominal tenderness. There is no guarding.  Musculoskeletal:     Cervical back: Normal range of motion and neck supple.     Right lower leg: No edema.     Left lower leg: No edema.  Skin:    General: Skin is warm and dry.  Neurological:     Mental Status: She is alert and oriented to person, place, and time. Mental status is at baseline.  Psychiatric:        Mood and Affect: Mood normal.     ED Results / Procedures / Treatments   Labs (all labs ordered are listed, but only abnormal results are displayed) Labs Reviewed  CBC - Abnormal; Notable for the following components:      Result Value   HCT 35.9 (*)    All other components within normal limits  D-DIMER, QUANTITATIVE - Abnormal; Notable for the following components:   D-Dimer, Quant 0.71 (*)    All other components within normal limits  BASIC METABOLIC PANEL - Abnormal; Notable for the following components:   Potassium 3.4 (*)    Glucose, Bld 107 (*)    BUN 26 (*)    All other components within normal limits  TROPONIN I (HIGH SENSITIVITY)  TROPONIN I (HIGH SENSITIVITY)    EKG EKG Interpretation  Date/Time:  Thursday May 26 2022 19:51:55 EDT Ventricular Rate:  52 PR Interval:  140 QRS Duration: 92 QT Interval:  462 QTC Calculation: 429 R Axis:   67 Text Interpretation: Sinus bradycardia Incomplete right bundle branch block Nonspecific ST abnormality Abnormal ECG When compared with ECG of 04-Mar-2019 11:48, Vent. rate has decreased BY  25 BPM Confirmed by Vonita MossPaterson, Lella Mullany [phone]([id number]) on 05/26/2022 8:08:47 PM  Radiology DG Chest 2 View  Result  Date: 05/26/2022 CLINICAL DATA:  Chest pain EXAM: CHEST - 2 VIEW COMPARISON:  03/27/2020 FINDINGS: No acute airspace disease. Calcified granuloma in the right lower lung. Normal cardiac size. Aortic atherosclerosis. No pneumothorax. IMPRESSION: No active cardiopulmonary disease. Electronically Signed   By: Jasmine PangKim  Fujinaga M.D.   On: 05/26/2022 20:46    Procedures Procedures   Medications Ordered in ED Medications -  No data to display  ED Course/ Medical Decision Making/ A&P Clinical Course as of 05/27/22 0052  Thu May 26, 2022  2153 D-Dimer, Quant(!): 0.71 Within age-adjusted limits [RP]    Clinical Course User Index [RP] Rondel Baton, MD                            Medical Decision Making Amount and/or Complexity of Data Reviewed Labs: ordered. Decision-making details documented in ED Course. Radiology: ordered.  Risk Prescription drug management.   CHARMIAN FERRAN is a 82 y.o. female with comorbidities that complicate the patient evaluation including anxiety, hypertension, and hyperlipidemia who presents to the emergency department with chest discomfort.    Initial Ddx:  MI, PE, anxiety, Takotsubo's cardiomyopathy, pneumonia, URI  MDM:  Given patient's age and risk factors will obtain EKG and troponins to evaluate for MI.  Does not have any other anginal symptoms at this time or other symptoms that would be consistent with MI such as unexplained diaphoresis, vomiting, or exertional pain.  Will obtain a D-dimer at this time to evaluate for PE given the pleuritic nature of her pain.  Could potentially be due to anxiety with the passing of her daughter recently.  No infectious symptoms that would suggest infection at this time.  Plan:  Labs Troponin D-dimer Chest x-ray EKG  ED Summary/Re-evaluation:  Patient underwent the above workup and was reassuring.  Pain had resolved in the emergency department.  Patient was noted to be bradycardic and so she was instructed to decrease  her metoprolol dosage and to follow-up with her primary doctor and cardiology regarding her visit today.  This patient presents to the ED for concern of complaints listed in HPI, this involves an extensive number of treatment options, and is a complaint that carries with it a high risk of complications and morbidity. Disposition including potential need for admission considered.   Dispo: DC Home. Return precautions discussed including, but not limited to, those listed in the AVS. Allowed pt time to ask questions which were answered fully prior to dc.  Additional history obtained from son Records reviewed Outpatient Clinic Notes The following labs were independently interpreted: Serial Troponins and show no acute abnormality I independently reviewed the following imaging with scope of interpretation limited to determining acute life threatening conditions related to emergency care: Chest x-ray and agree with the radiologist interpretation with the following exceptions: None I personally reviewed and interpreted cardiac monitoring: sinus bradycardia I personally reviewed and interpreted the pt's EKG: see above for interpretation  I have reviewed the patients home medications and made adjustments as needed Social Determinants of health:  Elderly  Final Clinical Impression(s) / ED Diagnoses Final diagnoses:  Chest pain, unspecified type  Other fatigue    Rx / DC Orders ED Discharge Orders          Ordered    metoprolol succinate (TOPROL-XL) 25 MG 24 hr tablet  Daily        05/26/22 2316    Ambulatory referral to Cardiology        05/26/22 2320              Rondel Baton, MD 05/27/22 [phone]

## 2022-05-26 NOTE — ED Notes (Signed)
Pt ambulated to restroom at this time with out assistance no problem noted. Mackenzie [name]

## 2022-05-26 NOTE — Discharge Instructions (Signed)
You were seen for your chest discomfort in the emergency department.   At home, please take the reduced dose of your metoprolol (12.5 mg instead of the 25 mg) due to your low heart rate.    Check your MyChart online for the results of any tests that had not resulted by the time you left the emergency department.   Follow-up with your primary doctor in 2-3 days regarding your visit.  Cardiology will be contacting you regarding an appointment for your chest discomfort.  Return immediately to the emergency department if you experience any of the following: Worsening chest pain, difficulty breathing, or any other concerning symptoms.    Thank you for visiting our Emergency Department. It was a pleasure taking care of you today.

## 2022-05-31 ENCOUNTER — Other Ambulatory Visit: Payer: Self-pay | Admitting: Internal Medicine

## 2022-06-15 DIAGNOSIS — M109 Gout, unspecified: Secondary | ICD-10-CM | POA: Diagnosis not present

## 2022-07-05 ENCOUNTER — Emergency Department (HOSPITAL_COMMUNITY)
Admission: EM | Admit: 2022-07-05 | Discharge: 2022-07-05 | Disposition: A | Discharge status: Home / Self Care | Payer: Medicare Other | Attending: Student | Admitting: Student

## 2022-07-05 ENCOUNTER — Other Ambulatory Visit: Payer: Self-pay

## 2022-07-05 ENCOUNTER — Emergency Department (HOSPITAL_COMMUNITY): Payer: Medicare Other

## 2022-07-05 DIAGNOSIS — Z7982 Long term (current) use of aspirin: Secondary | ICD-10-CM | POA: Diagnosis not present

## 2022-07-05 DIAGNOSIS — Z79899 Other long term (current) drug therapy: Secondary | ICD-10-CM | POA: Insufficient documentation

## 2022-07-05 DIAGNOSIS — M109 Gout, unspecified: Secondary | ICD-10-CM

## 2022-07-05 DIAGNOSIS — R102 Pelvic and perineal pain: Secondary | ICD-10-CM | POA: Diagnosis not present

## 2022-07-05 DIAGNOSIS — R531 Weakness: Secondary | ICD-10-CM

## 2022-07-05 DIAGNOSIS — Z96641 Presence of right artificial hip joint: Secondary | ICD-10-CM | POA: Diagnosis not present

## 2022-07-05 DIAGNOSIS — T402X1A Poisoning by other opioids, accidental (unintentional), initial encounter: Secondary | ICD-10-CM | POA: Diagnosis not present

## 2022-07-05 DIAGNOSIS — I10 Essential (primary) hypertension: Secondary | ICD-10-CM | POA: Diagnosis not present

## 2022-07-05 DIAGNOSIS — M79644 Pain in right finger(s): Secondary | ICD-10-CM | POA: Diagnosis not present

## 2022-07-05 DIAGNOSIS — T40601A Poisoning by unspecified narcotics, accidental (unintentional), initial encounter: Secondary | ICD-10-CM

## 2022-07-05 DIAGNOSIS — M25562 Pain in left knee: Secondary | ICD-10-CM | POA: Diagnosis not present

## 2022-07-05 DIAGNOSIS — Z87891 Personal history of nicotine dependence: Secondary | ICD-10-CM | POA: Diagnosis not present

## 2022-07-05 DIAGNOSIS — Z96651 Presence of right artificial knee joint: Secondary | ICD-10-CM | POA: Insufficient documentation

## 2022-07-05 LAB — RAPID URINE DRUG SCREEN, HOSP PERFORMED
Amphetamines: NOT DETECTED
Barbiturates: NOT DETECTED
Benzodiazepines: POSITIVE — AB
Cocaine: NOT DETECTED
Opiates: POSITIVE — AB
Tetrahydrocannabinol: NOT DETECTED

## 2022-07-05 LAB — URINALYSIS, ROUTINE W REFLEX MICROSCOPIC
Bacteria, UA: NONE SEEN
Bilirubin Urine: NEGATIVE
Glucose, UA: NEGATIVE mg/dL
Ketones, ur: NEGATIVE mg/dL
Leukocytes,Ua: NEGATIVE
Nitrite: NEGATIVE
Protein, ur: NEGATIVE mg/dL
Specific Gravity, Urine: 1.011 (ref 1.005–1.030)
pH: 6 (ref 5.0–8.0)

## 2022-07-05 LAB — CBC
HCT: 31.5 % — ABNORMAL LOW (ref 36.0–46.0)
Hemoglobin: 10.2 g/dL — ABNORMAL LOW (ref 12.0–15.0)
MCH: 30.1 pg (ref 26.0–34.0)
MCHC: 32.4 g/dL (ref 30.0–36.0)
MCV: 92.9 fL (ref 80.0–100.0)
Platelets: 370 10*3/uL (ref 150–400)
RBC: 3.39 MIL/uL — ABNORMAL LOW (ref 3.87–5.11)
RDW: 11.8 % (ref 11.5–15.5)
WBC: 7.9 10*3/uL (ref 4.0–10.5)
nRBC: 0 % (ref 0.0–0.2)

## 2022-07-05 LAB — BASIC METABOLIC PANEL
Anion gap: 11 (ref 5–15)
BUN: 24 mg/dL — ABNORMAL HIGH (ref 8–23)
CO2: 23 mmol/L (ref 22–32)
Calcium: 8.5 mg/dL — ABNORMAL LOW (ref 8.9–10.3)
Chloride: 99 mmol/L (ref 98–111)
Creatinine, Ser: 0.96 mg/dL (ref 0.44–1.00)
GFR, Estimated: 59 mL/min — ABNORMAL LOW (ref >60)
Glucose, Bld: 109 mg/dL — ABNORMAL HIGH (ref 70–99)
Potassium: 3.8 mmol/L (ref 3.5–5.1)
Sodium: 133 mmol/L — ABNORMAL LOW (ref 135–145)

## 2022-07-05 LAB — CK: Total CK: 120 U/L (ref 38–234)

## 2022-07-05 LAB — URIC ACID: Uric Acid, Serum: 7.5 mg/dL — ABNORMAL HIGH (ref 2.5–7.1)

## 2022-07-05 MED ORDER — LACTATED RINGERS IV BOLUS
1000.0000 mL | Freq: Once | INTRAVENOUS | Status: AC
  Administered 2022-07-05: 1000 mL via INTRAVENOUS

## 2022-07-05 MED ORDER — PREDNISONE 20 MG PO TABS
40.0000 mg | ORAL_TABLET | Freq: Once | ORAL | Status: AC
  Administered 2022-07-05: 40 mg via ORAL
  Filled 2022-07-05: qty 2

## 2022-07-05 MED ORDER — METHYLPREDNISOLONE 4 MG PO TBPK: ORAL_TABLET | ORAL | 0 refills | Status: DC

## 2022-07-05 MED ORDER — NAPROXEN 375 MG PO TABS: 375.0000 mg | ORAL_TABLET | Freq: Two times a day (BID) | ORAL | 0 refills | Status: DC

## 2022-07-05 MED ORDER — KETOROLAC TROMETHAMINE 15 MG/ML IJ SOLN
15.0000 mg | Freq: Once | INTRAMUSCULAR | Status: AC
  Administered 2022-07-05: 15 mg via INTRAVENOUS
  Filled 2022-07-05: qty 1

## 2022-07-05 NOTE — ED Triage Notes (Addendum)
Pt brought in by son against her protest for weakness.  Pt states she just feels like she has had such chronic pain for so long that she has some depression.  Son reports that the daughter had passed away on 04/12/24and this weekend his mom lost a close friend.  Son suspects perhaps she has over medicated herself.  Decreased mobility at home.

## 2022-07-05 NOTE — ED Provider Notes (Signed)
[place] EMERGENCY DEPARTMENT AT [facility] Provider Note  CSN: [id number] Arrival date & time: 07/05/22 1233  Chief Complaint(s) Weakness  HPI Mackenzie [name] is a 82 y.o. female with PMH anxiety on as needed Xanax, anemia, HLD, HTN, gout and pseudogout who presents emergency department for evaluation of generalized weakness and an episode of transient alteration of awareness.  Patient arrives with her son who states that over the last 24 hours she has had a significant decrease in her ability to ambulate and this morning was not able to use her walker.  The patient has been dealing with multiple stressors in the outpatient setting including the loss of her daughter on May 16, 2022 and the recent loss of a neighbor.  She states that her pain has been worsening primarily in her left hip, left knee and right second index finger and she has been taking medications for this but is unsure of which medications.  Here in the emergency room, she is alert and oriented answering all questions appropriately with minimal pain.  Denies chest pain, shortness of breath, abdominal pain, nausea, vomiting or other systemic symptoms.  Patient denies suicidal ideation, homicidal ideation, auditory or visual hallucinations.   Past Medical History Past Medical History:  Diagnosis Date   Anemia, normocytic normochromic 01/2012   hx of   Anxiety    takes Xanax daily as needed   Arthritis    knees, hands   Cataract    right eye   Depression    takes Citalopram daily   Dizziness    cardiologist is aware and told pt it was related to meds   History of bronchitis    2014   History of colon polyps    benign   History of migraine    Hyperlipidemia    takes Pravastatin daily   Hypertension    takes Metoprolol and Azor daily   Joint pain    Pneumonia    hx of-2015   Primary localized osteoarthritis of right knee 10/01/2019   Pseudogout of knee, right 02/22/2017   Renal insufficiency 02/22/2017    Rheumatic fever    hx of   Wheezing on expiration    Patient Active Problem List   Diagnosis Date Noted   H/O vitrectomy 01/16/2020   Macular pucker, left eye 12/24/2019   Intermediate stage nonexudative age-related macular degeneration of both eyes 12/24/2019   Posterior vitreous detachment of right eye 12/24/2019   Pseudophakia of both eyes 12/24/2019   Primary localized osteoarthritis of right knee 10/01/2019   Lumbar disc herniation 01/24/2018   Constipation 03/09/2017   Pseudogout of knee, right 02/22/2017   Renal insufficiency 02/22/2017   Septic joint of right knee joint (HCC) 02/21/2017   Infection of right knee (HCC) 02/06/2017   Septic arthritis (HCC) 02/06/2017   Chest pain 03/04/2015   Essential hypertension    Dyslipidemia    Primary osteoarthritis of right hip 12/16/2014   Postoperative anemia due to acute blood loss 12/16/2014   Bilateral carotid artery disease (HCC) 11/05/2014   Multiple fractures of ribs of right side 06/18/2012   Pneumothorax on right 06/18/2012   Unstable angina (HCC) 01/31/2012   Cerebrovascular disease 01/31/2012   Arteriosclerotic cardiovascular disease (ASCVD)    Hyperlipidemia    Hypertension    Anemia, normocytic normochromic 01/15/2012   MITRAL VALVE PROLAPSE 12/24/2008   Home Medication(s) Prior to Admission medications   Medication Sig Start Date End Date Taking? Authorizing Provider  methylPREDNISolone (MEDROL DOSEPAK) 4  MG TBPK tablet Take as prescribed 07/05/22  Yes Pratik Dalziel, MD  naproxen (NAPROSYN) 375 MG tablet Take 1 tablet (375 mg total) by mouth 2 (two) times daily. 07/05/22  Yes Adarryl Goldammer, MD  acetaminophen (TYLENOL) 500 MG tablet Take 1,000 mg by mouth every 6 (six) hours as needed for moderate pain or headache.    [provider]  ALPRAZolam Prudy Feeler) 0.25 MG tablet Take 0.0625-0.25 mg by mouth 2 (two) times daily as needed for anxiety.  07/26/19   [provider]  amLODipine (NORVASC) 5 MG  tablet Take 5 mg by mouth daily. 07/13/19   [provider]  aspirin EC 81 MG tablet Take 81 mg by mouth daily. Swallow whole.    [provider]  chlorthalidone (HYGROTON) 25 MG tablet Take 25 mg by mouth daily.    [provider]  citalopram (CELEXA) 10 MG tablet Take 10 mg by mouth daily.    [provider]  Colchicine 0.6 MG CAPS Take 1 capsule by mouth daily as needed (gout). 01/23/19   [provider]  Cyanocobalamin (VITAMIN B-12 IJ) Inject 1 mL as directed every 30 (thirty) days.    [provider]  dicyclomine (BENTYL) 20 MG tablet Take 1 tablet (20 mg total) by mouth 2 (two) times daily as needed for up to 15 doses for spasms. 02/10/22   Terald Sleeper, MD  docusate sodium (COLACE) 100 MG capsule 1 tab 2 times a day while on narcotics.  STOOL SOFTENER 10/12/19   Shepperson, Kirstin, PA-C  donepezil (ARICEPT) 5 MG tablet Take 5 mg by mouth daily. 01/18/22   [provider]  metoprolol succinate (TOPROL-XL) 25 MG 24 hr tablet Take 0.5 tablets (12.5 mg total) by mouth daily. 05/26/22   Rondel Baton, MD  ondansetron (ZOFRAN) 4 MG tablet Take 1 tablet (4 mg total) by mouth every 8 (eight) hours as needed for up to 12 doses for nausea or vomiting. 02/10/22   Terald Sleeper, MD  polyethylene glycol (MIRALAX / GLYCOLAX) 17 g packet 17grams in 6 oz of something to drink twice a day until bowel movement.  LAXITIVE.  Restart if two days since last bowel movement 10/12/19   Shepperson, Kirstin, PA-C  potassium chloride (KLOR-CON) 10 MEQ tablet Take 1 tablet (10 mEq total) by mouth daily for 20 days. 02/10/22 03/02/22  Terald Sleeper, MD  pravastatin (PRAVACHOL) 40 MG tablet Take 40 mg by mouth at bedtime.    [provider]                                                                                                                                    Past Surgical History Past Surgical History:  Procedure Laterality Date    ABDOMINAL HYSTERECTOMY     APPENDECTOMY     CARDIAC CATHETERIZATION     early 2000's   CARDIAC CATHETERIZATION N/A 03/04/2015   Procedure:  Left Heart Cath and Coronary Angiography;  Surgeon: Tonny Bollman, MD;  Location: [facility] INVASIVE CV LAB;  Service: Cardiovascular;  Laterality: N/A;   CATARACT EXTRACTION Bilateral    cataract surgery Left    CHOLECYSTECTOMY     CHONDROPLASTY Right 02/21/2017   Procedure: CHONDROPLASTY WITH DEBRIDEMENT;  Surgeon: Salvatore Marvel, MD;  Location: [facility];  Service: Orthopedics;  Laterality: Right;   COLONOSCOPY  2005   Negative screening study   COLONOSCOPY N/A 04/04/2014   Procedure: COLONOSCOPY;  Surgeon: Malissa Hippo, MD;  Location: [facility];  Service: Endoscopy;  Laterality: N/A;  1225   EXPLORATORY LAPAROTOMY     x 3   EYE SURGERY     IR GENERIC HISTORICAL  10/15/2015   IR RADIOLOGIST EVAL & MGMT 10/15/2015 [facility]INTER[facility] RAD   IR GENERIC HISTORICAL  10/20/2015   IR KYPHO LUMBAR INC FX REDUCE BONE BX UNI/BIL CANNULATION INC/IMAGING 10/20/2015 Julieanne Cotton, MD [facility]   IRRIGATION AND DEBRIDEMENT KNEE Right 02/21/2017   Procedure: IRRIGATION AND DEBRIDEMENT KNEE;  Surgeon: Salvatore Marvel, MD;  Location: [facility];  Service: Orthopedics;  Laterality: Right;   LUMBAR LAMINECTOMY/DECOMPRESSION MICRODISCECTOMY Left 01/24/2018   Procedure: LEFT LUMBAR FOUR- LUMBAR FIVE  LAMINOTOMY AND MICRODISCECTOMY;  Surgeon: Shirlean Kelly, MD;  Location: [facility] OR;  Service: Neurosurgery;  Laterality: Left;  LEFT LUMBAR 4- LUMBAR 5  LAMINOTOMY AND MICRODISCECTOMY   TOTAL HIP ARTHROPLASTY Right 12/16/2014   Procedure: TOTAL HIP ARTHROPLASTY ANTERIOR APPROACH;  Surgeon: Sheral Apley, MD;  Location: [facility] OR;  Service: Orthopedics;  Laterality: Right;   TOTAL KNEE ARTHROPLASTY Right 10/11/2019   Procedure: TOTAL KNEE ARTHROPLASTY;  Surgeon: Salvatore Marvel, MD;  Location: [facility] ORS;  Service: Orthopedics;  Laterality: Right;   Family  History Family History  Problem Relation Age of Onset   Heart attack Mother 66       heart problems   Cancer Mother        uterus   Pancreatitis Sister        died  age 77   Colon cancer Maternal Aunt 58   Colon cancer Maternal Aunt 78   Colon cancer Maternal Aunt 74   Cancer Other        FH   Diabetes Other        FH   Hypertension Other        FH   Diabetes Son    Diabetes Daughter    Colon cancer Maternal Uncle 65    Social History Social History   Tobacco Use   Smoking status: Former    Packs/day: 1.00    Years: 15.00    Additional pack years: 0.00    Total pack years: 15.00    Types: Cigarettes   Smokeless tobacco: Never   Tobacco comments:    quit smoking 30+yrs ago  Vaping Use   Vaping Use: Never used  Substance Use Topics   Alcohol use: Yes    Alcohol/week: 3.0 standard drinks of alcohol    Types: 3 Glasses of wine per week    Comment: socially wine   Drug use: No   Allergies Nickel, Palladium chloride, Statins, Bacitracin, and Gold sodium thiosulfate  Review of Systems Review of Systems  Musculoskeletal:  Positive for arthralgias.  Psychiatric/Behavioral:  Positive for confusion.     Physical Exam Vital Signs  I have reviewed the triage vital signs BP (!) 154/50   Pulse 60   Temp 98.2 F (36.8 C) (Oral)  Resp 15   SpO2 96%   Physical Exam Musculoskeletal:        General: Swelling and tenderness present.     ED Results and Treatments Labs (all labs ordered are listed, but only abnormal results are displayed) Labs Reviewed  BASIC METABOLIC PANEL - Abnormal; Notable for the following components:      Result Value   Sodium 133 (*)    Glucose, Bld 109 (*)    BUN 24 (*)    Calcium 8.5 (*)    GFR, Estimated 59 (*)    All other components within normal limits  CBC - Abnormal; Notable for the following components:   RBC 3.39 (*)    Hemoglobin 10.2 (*)    HCT 31.5 (*)    All other components within normal limits  URIC ACID -  Abnormal; Notable for the following components:   Uric Acid, Serum 7.5 (*)    All other components within normal limits  URINALYSIS, ROUTINE W REFLEX MICROSCOPIC - Abnormal; Notable for the following components:   Hgb urine dipstick SMALL (*)    All other components within normal limits  RAPID URINE DRUG SCREEN, HOSP PERFORMED - Abnormal; Notable for the following components:   Opiates POSITIVE (*)    Benzodiazepines POSITIVE (*)    All other components within normal limits  CK                                                                                                                          Radiology DG Finger Index Right  Result Date: 07/05/2022 CLINICAL DATA:  Right index finger pain. EXAM: RIGHT INDEX FINGER 2+V COMPARISON:  None Available. FINDINGS: There is no evidence of fracture or dislocation. Mild degenerative changes seen involving distal interphalangeal joint. Severe degenerative changes seen involving first carpometacarpal joint. Soft tissues are unremarkable. IMPRESSION: Osteoarthritis involving first carpometacarpal and second distal interphalangeal joints. No acute abnormality seen. Electronically Signed   By: Lupita Raider M.D.   On: 07/05/2022 13:49   DG Knee Complete 4 Views Left  Result Date: 07/05/2022 CLINICAL DATA:  Left knee pain. EXAM: LEFT KNEE - COMPLETE 4+ VIEW COMPARISON:  None Available. FINDINGS: No evidence of fracture, dislocation, or joint effusion. Moderate narrowing of medial joint space is noted. Chondrocalcinosis is noted in lateral joint space. Soft tissues are unremarkable. IMPRESSION: Moderate degenerative joint disease is noted medially. Chondrocalcinosis is noted laterally most likely representing degenerative change. No acute abnormality seen. Electronically Signed   By: Lupita Raider M.D.   On: 07/05/2022 13:48   DG Hip Unilat W or Wo Pelvis 2-3 Views Left  Result Date: 07/05/2022 CLINICAL DATA:  Pelvic pain. EXAM: DG HIP (WITH OR WITHOUT  PELVIS) 2-3V LEFT COMPARISON:  None Available. FINDINGS: There is no evidence of hip fracture or dislocation. There is no evidence of arthropathy or other focal bone abnormality. IMPRESSION: Negative. Electronically Signed   By: Lupita Raider M.D.   On: 07/05/2022 13:46  Pertinent labs & imaging results that were available during my care of the patient were reviewed by me and considered in my medical decision making (see MDM for details).  Medications Ordered in ED Medications  predniSONE (DELTASONE) tablet 40 mg (40 mg Oral Given 07/05/22 1512)  ketorolac (TORADOL) 15 MG/ML injection 15 mg (15 mg Intravenous Given 07/05/22 1512)  lactated ringers bolus 1,000 mL (0 mLs Intravenous Stopped 07/05/22 1545)                                                                                                                                     Procedures Procedures  (including critical care time)  Medical Decision Making / ED Course   This patient presents to the ED for concern of hip pain, knee pain, generalized fatigue transient confusion, this involves an extensive number of treatment options, and is a complaint that carries with it a high risk of complications and morbidity.  The differential diagnosis includes infection, metabolic/toxic encephalopathy, hypoglycemia, malperfusion, hypoxia, gout, pseudogout, osteoarthritis, polypharmacy  MDM: Patient seen emergency room for evaluation of multiple complaints described above.  Physical exam reveals tenderness over the left hip, left knee and right second index finger but no external evidence of trauma.  Neurologic exam otherwise unremarkable with no focal motor or sensory deficits.  She does not appear disoriented here in the emergency department today.  Laboratory evaluation is largely unremarkable outside of a mild decrease in her hemoglobin to 10.2 from a baseline of around 12.  She has not been having any dark stools or hematemesis.  X-ray imaging  unremarkable.  Urinalysis is negative for infection but does appear positive for opiates.  Patient does not have any opiates in her medical history or MAR and on further investigation it does appear that she received opioids from a neighbor because her hip pain had worsened and she did not inform myself initially or the patient's son of this.  Given patient's rapid improvement of her mental status during her stay here in the emergency room, suspect toxic encephalopathy secondary to accidental opioid intoxication/overdose in the sun will go to the home and search for additional medications that may be clouding picture here.  I have lower suspicion for symptomatic anemia or GI bleeding.  Patient was able to ambulate in the emergency room without any difficulty, no weakness or shortness of breath noted.  Patient can discharge with outpatient follow-up in the care of her son.   Additional history obtained: -Additional history obtained from son -External records from outside source obtained and reviewed including: Chart review including previous notes, labs, imaging, consultation notes   Lab Tests: -I ordered, reviewed, and interpreted labs.   The pertinent results include:   Labs Reviewed  BASIC METABOLIC PANEL - Abnormal; Notable for the following components:      Result Value   Sodium 133 (*)    Glucose, Bld 109 (*)  BUN 24 (*)    Calcium 8.5 (*)    GFR, Estimated 59 (*)    All other components within normal limits  CBC - Abnormal; Notable for the following components:   RBC 3.39 (*)    Hemoglobin 10.2 (*)    HCT 31.5 (*)    All other components within normal limits  URIC ACID - Abnormal; Notable for the following components:   Uric Acid, Serum 7.5 (*)    All other components within normal limits  URINALYSIS, ROUTINE W REFLEX MICROSCOPIC - Abnormal; Notable for the following components:   Hgb urine dipstick SMALL (*)    All other components within normal limits  RAPID URINE DRUG  SCREEN, HOSP PERFORMED - Abnormal; Notable for the following components:   Opiates POSITIVE (*)    Benzodiazepines POSITIVE (*)    All other components within normal limits  CK      EKG   EKG Interpretation  Date/Time:  Tuesday Jul 05 2022 12:47:27 EDT Ventricular Rate:  69 PR Interval:  205 QRS Duration: 98 QT Interval:  422 QTC Calculation: 453 R Axis:   40 Text Interpretation: Sinus rhythm Confirmed by Alvino Blood ([id number]) on 07/05/2022 3:08:29 PM         Imaging Studies ordered: I ordered imaging studies including x-ray index finger, knee, hip I independently visualized and interpreted imaging. I agree with the radiologist interpretation   Medicines ordered and prescription drug management: Meds ordered this encounter  Medications   predniSONE (DELTASONE) tablet 40 mg   ketorolac (TORADOL) 15 MG/ML injection 15 mg   lactated ringers bolus 1,000 mL   methylPREDNISolone (MEDROL DOSEPAK) 4 MG TBPK tablet    Sig: Take as prescribed    Dispense:  1 each    Refill:  0   naproxen (NAPROSYN) 375 MG tablet    Sig: Take 1 tablet (375 mg total) by mouth 2 (two) times daily.    Dispense:  20 tablet    Refill:  0    -I have reviewed the patients home medicines and have made adjustments as needed  Critical interventions none  Cardiac Monitoring: The patient was maintained on a cardiac monitor.  I personally viewed and interpreted the cardiac monitored which showed an underlying rhythm of: NSR  Social Determinants of Health:  Factors impacting patients care include: Patient taking opioids that are not hers   Reevaluation: After the interventions noted above, I reevaluated the patient and found that they have :improved  Co morbidities that complicate the patient evaluation  Past Medical History:  Diagnosis Date   Anemia, normocytic normochromic 01/2012   hx of   Anxiety    takes Xanax daily as needed   Arthritis    knees, hands   Cataract    right eye    Depression    takes Citalopram daily   Dizziness    cardiologist is aware and told pt it was related to meds   History of bronchitis    2014   History of colon polyps    benign   History of migraine    Hyperlipidemia    takes Pravastatin daily   Hypertension    takes Metoprolol and Azor daily   Joint pain    Pneumonia    hx of-2015   Primary localized osteoarthritis of right knee 10/01/2019   Pseudogout of knee, right 02/22/2017   Renal insufficiency 02/22/2017   Rheumatic fever    hx of   Wheezing on expiration  Dispostion: I considered admission for this patient, but at this time she does not meet inpatient criteria for admission and she is safe for discharge with outpatient follow-up     Final Clinical Impression(s) / ED Diagnoses Final diagnoses:  Generalized weakness  Opiate overdose, accidental or unintentional, initial encounter [facility])  Acute gout, unspecified cause, unspecified site     @PCDICTATION @    Glendora Score, MD 07/05/22 2038

## 2022-07-05 NOTE — ED Notes (Signed)
See triage notes. Pt denies wanting to hurt/kill self. But sometimes wished she was dead due to recent loss of daughter and chronic pain. Pt has been taking a morning dose and night dose of metoprolol and pravastatin when only ordered once daily. Son stated this and pt agreed due to the pravastatin making her calm per pt. Pt seems to be in good spirits most of time. Son at bedside.

## 2022-08-09 DIAGNOSIS — I10 Essential (primary) hypertension: Secondary | ICD-10-CM | POA: Diagnosis not present

## 2022-08-09 DIAGNOSIS — F0150 Vascular dementia without behavioral disturbance: Secondary | ICD-10-CM | POA: Diagnosis not present

## 2022-08-14 ENCOUNTER — Emergency Department (HOSPITAL_COMMUNITY)
Admission: EM | Admit: 2022-08-14 | Discharge: 2022-08-14 | Disposition: A | Discharge status: Home / Self Care | Payer: Medicare Other | Attending: Emergency Medicine | Admitting: Emergency Medicine

## 2022-08-14 ENCOUNTER — Other Ambulatory Visit: Payer: Self-pay

## 2022-08-14 ENCOUNTER — Emergency Department (HOSPITAL_COMMUNITY): Payer: Medicare Other

## 2022-08-14 DIAGNOSIS — E876 Hypokalemia: Secondary | ICD-10-CM | POA: Insufficient documentation

## 2022-08-14 DIAGNOSIS — R55 Syncope and collapse: Secondary | ICD-10-CM | POA: Diagnosis not present

## 2022-08-14 DIAGNOSIS — I959 Hypotension, unspecified: Secondary | ICD-10-CM | POA: Diagnosis not present

## 2022-08-14 DIAGNOSIS — E860 Dehydration: Secondary | ICD-10-CM | POA: Diagnosis not present

## 2022-08-14 DIAGNOSIS — S0990XA Unspecified injury of head, initial encounter: Secondary | ICD-10-CM | POA: Diagnosis not present

## 2022-08-14 DIAGNOSIS — R531 Weakness: Secondary | ICD-10-CM | POA: Diagnosis not present

## 2022-08-14 DIAGNOSIS — Z7982 Long term (current) use of aspirin: Secondary | ICD-10-CM | POA: Insufficient documentation

## 2022-08-14 LAB — URINALYSIS, ROUTINE W REFLEX MICROSCOPIC
Bilirubin Urine: NEGATIVE
Glucose, UA: NEGATIVE mg/dL
Hgb urine dipstick: NEGATIVE
Ketones, ur: NEGATIVE mg/dL
Leukocytes,Ua: NEGATIVE
Nitrite: NEGATIVE
Protein, ur: NEGATIVE mg/dL
Specific Gravity, Urine: 1.016 (ref 1.005–1.030)
pH: 7 (ref 5.0–8.0)

## 2022-08-14 LAB — MAGNESIUM: Magnesium: 1.9 mg/dL (ref 1.7–2.4)

## 2022-08-14 LAB — TROPONIN I (HIGH SENSITIVITY)
Troponin I (High Sensitivity): 3 ng/L (ref <18)
Troponin I (High Sensitivity): 3 ng/L (ref <18)

## 2022-08-14 LAB — CBC
HCT: 37.3 % (ref 36.0–46.0)
Hemoglobin: 12 g/dL (ref 12.0–15.0)
MCH: 30.8 pg (ref 26.0–34.0)
MCHC: 32.2 g/dL (ref 30.0–36.0)
MCV: 95.6 fL (ref 80.0–100.0)
Platelets: 352 10*3/uL (ref 150–400)
RBC: 3.9 MIL/uL (ref 3.87–5.11)
RDW: 13.3 % (ref 11.5–15.5)
WBC: 10.1 10*3/uL (ref 4.0–10.5)
nRBC: 0 % (ref 0.0–0.2)

## 2022-08-14 LAB — BASIC METABOLIC PANEL
Anion gap: 10 (ref 5–15)
BUN: 31 mg/dL — ABNORMAL HIGH (ref 8–23)
CO2: 25 mmol/L (ref 22–32)
Calcium: 9.3 mg/dL (ref 8.9–10.3)
Chloride: 102 mmol/L (ref 98–111)
Creatinine, Ser: 1.01 mg/dL — ABNORMAL HIGH (ref 0.44–1.00)
GFR, Estimated: 55 mL/min — ABNORMAL LOW (ref >60)
Glucose, Bld: 131 mg/dL — ABNORMAL HIGH (ref 70–99)
Potassium: 3.4 mmol/L — ABNORMAL LOW (ref 3.5–5.1)
Sodium: 137 mmol/L (ref 135–145)

## 2022-08-14 LAB — CBG MONITORING, ED: Glucose-Capillary: 140 mg/dL — ABNORMAL HIGH (ref 70–99)

## 2022-08-14 LAB — HEPATIC FUNCTION PANEL
ALT: 12 U/L (ref 0–44)
AST: 17 U/L (ref 15–41)
Albumin: 3.6 g/dL (ref 3.5–5.0)
Alkaline Phosphatase: 70 U/L (ref 38–126)
Bilirubin, Direct: <0.1 mg/dL (ref 0.0–0.2)
Total Bilirubin: 0.5 mg/dL (ref 0.3–1.2)
Total Protein: 7 g/dL (ref 6.5–8.1)

## 2022-08-14 MED ORDER — SODIUM CHLORIDE 0.9 % IV BOLUS
1000.0000 mL | Freq: Once | INTRAVENOUS | Status: AC
  Administered 2022-08-14: 1000 mL via INTRAVENOUS

## 2022-08-14 NOTE — ED Notes (Signed)
Pt ambulated to BR with steady gait. Endorses a little bit of lightheadedness. Tolerated well.

## 2022-08-14 NOTE — ED Notes (Signed)
RN to room to assess patient d/t cardiac monitor alarming Vtach, does not appear to be artifact. Pt A&O X 4. Reports feeling cold, but no other new symptoms. Strip printed and showed to MD. Dr. Charm Barges to room to assess patient.

## 2022-08-14 NOTE — ED Triage Notes (Signed)
Pt arrived via [facility] with c/o syncope episode. Pt was outside for 15 minutes and neighbor was talking with pt and helped her to the ground. Neighbor brought pt into house and layed her on the bed. Pt only remembers she was talking to her friend and she felt sick and then woke up in the bed. Pt had an incontinent episode during event and stated she never does that. Pt alert and verbal now.

## 2022-08-14 NOTE — ED Notes (Signed)
AVS provided to and discussed with patient and family member at bedside. Pt verbalizes understanding of discharge instructions and denies any questions or concerns at this time. Pt has ride home. Pt ambulated out of department independently with steady gait.  

## 2022-08-14 NOTE — ED Provider Notes (Signed)
[place] EMERGENCY DEPARTMENT AT [facility] Provider Note   CSN: [id number] Arrival date & time: 08/14/22  1338     History {Add pertinent medical, surgical, social history, OB history to HPI:1} Chief Complaint  Patient presents with   Loss of Consciousness    Mackenzie [name] is a 82 y.o. female.  She is brought in by ambulance after a syncopal event witnessed outside while talking to neighbors.  She had no prodromal symptoms.  She feels back to baseline now and denies any injury.  She has not been sick recently.  No recent medication changes.  No cough or shortness of breath.  Her only complaint right now is that she feels cold and wants more blankets.  She ate a good breakfast.  No head injury.   The history is provided by the patient.  Loss of Consciousness Episode history:  Single Most recent episode:  Today Progression:  Resolved Chronicity:  New Context: normal activity   Witnessed: yes   Relieved by:  Lying down Worsened by:  Nothing Ineffective treatments:  None tried Associated symptoms: no chest pain, no difficulty breathing, no fever, no nausea, no seizures, no shortness of breath and no vomiting        Home Medications Prior to Admission medications   Medication Sig Start Date End Date Taking? Authorizing Provider  acetaminophen (TYLENOL) 500 MG tablet Take 1,000 mg by mouth every 6 (six) hours as needed for moderate pain or headache.    [provider]  ALPRAZolam Prudy Feeler) 0.25 MG tablet Take 0.0625-0.25 mg by mouth 2 (two) times daily as needed for anxiety.  07/26/19   [provider]  amLODipine (NORVASC) 5 MG tablet Take 5 mg by mouth daily. 07/13/19   [provider]  aspirin EC 81 MG tablet Take 81 mg by mouth daily. Swallow whole.    [provider]  chlorthalidone (HYGROTON) 25 MG tablet Take 25 mg by mouth daily.    [provider]  citalopram (CELEXA) 10 MG tablet Take 10 mg by mouth daily.    [provider]  Colchicine 0.6 MG CAPS Take 1 capsule by mouth daily as needed (gout). 01/23/19   [provider]  Cyanocobalamin (VITAMIN B-12 IJ) Inject 1 mL as directed every 30 (thirty) days.    [provider]  dicyclomine (BENTYL) 20 MG tablet Take 1 tablet (20 mg total) by mouth 2 (two) times daily as needed for up to 15 doses for spasms. 02/10/22   Terald Sleeper, MD  docusate sodium (COLACE) 100 MG capsule 1 tab 2 times a day while on narcotics.  STOOL SOFTENER 10/12/19   Shepperson, Kirstin, PA-C  donepezil (ARICEPT) 5 MG tablet Take 5 mg by mouth daily. 01/18/22   [provider]  methylPREDNISolone (MEDROL DOSEPAK) 4 MG TBPK tablet Take as prescribed 07/05/22   Kommor, Madison, MD  metoprolol succinate (TOPROL-XL) 25 MG 24 hr tablet Take 0.5 tablets (12.5 mg total) by mouth daily. 05/26/22   Rondel Baton, MD  naproxen (NAPROSYN) 375 MG tablet Take 1 tablet (375 mg total) by mouth 2 (two) times daily. 07/05/22   Kommor, Madison, MD  ondansetron (ZOFRAN) 4 MG tablet Take 1 tablet (4 mg total) by mouth every 8 (eight) hours as needed for up to 12 doses for nausea or vomiting. 02/10/22   Terald Sleeper, MD  polyethylene glycol (MIRALAX / GLYCOLAX) 17 g packet 17grams in 6 oz of something to drink twice a day  until bowel movement.  LAXITIVE.  Restart if two days since last bowel movement 10/12/19   Shepperson, Kirstin, PA-C  potassium chloride (KLOR-CON) 10 MEQ tablet Take 1 tablet (10 mEq total) by mouth daily for 20 days. 02/10/22 03/02/22  Terald Sleeper, MD  pravastatin (PRAVACHOL) 40 MG tablet Take 40 mg by mouth at bedtime.    [provider]      Allergies    Nickel, Palladium chloride, Statins, Bacitracin, and Gold sodium thiosulfate    Review of Systems   Review of Systems  Constitutional:  Negative for fever.  Respiratory:  Negative for shortness of breath.   Cardiovascular:  Positive for syncope. Negative for chest pain.   Gastrointestinal:  Negative for nausea and vomiting.  Neurological:  Negative for seizures.    Physical Exam Updated Vital Signs BP (!) 169/67   Pulse (!) 57   Temp 98.2 F (36.8 C) (Oral)   Resp 15   Ht 5' (1.524 m)   Wt 49.9 kg   SpO2 97%   BMI 21.48 kg/m  Physical Exam Vitals and nursing note reviewed.  Constitutional:      General: She is not in acute distress.    Appearance: Normal appearance. She is well-developed.  HENT:     Head: Normocephalic and atraumatic.  Eyes:     Conjunctiva/sclera: Conjunctivae normal.  Cardiovascular:     Rate and Rhythm: Normal rate and regular rhythm.     Heart sounds: No murmur heard. Pulmonary:     Effort: Pulmonary effort is normal. No respiratory distress.     Breath sounds: Normal breath sounds. No stridor. No wheezing.  Abdominal:     Palpations: Abdomen is soft.     Tenderness: There is no abdominal tenderness. There is no guarding or rebound.  Musculoskeletal:        General: No tenderness or deformity. Normal range of motion.     Cervical back: Neck supple.  Skin:    General: Skin is warm and dry.  Neurological:     General: No focal deficit present.     Mental Status: She is alert and oriented to person, place, and time.     GCS: GCS eye subscore is 4. GCS verbal subscore is 5. GCS motor subscore is 6.     Sensory: No sensory deficit.     Motor: No weakness.     ED Results / Procedures / Treatments   Labs (all labs ordered are listed, but only abnormal results are displayed) Labs Reviewed  BASIC METABOLIC PANEL - Abnormal; Notable for the following components:      Result Value   Potassium 3.4 (*)    Glucose, Bld 131 (*)    BUN 31 (*)    Creatinine, Ser 1.01 (*)    GFR, Estimated 55 (*)    All other components within normal limits  CBG MONITORING, ED - Abnormal; Notable for the following components:   Glucose-Capillary 140 (*)    All other components within normal limits  CBC  URINALYSIS, ROUTINE W REFLEX  MICROSCOPIC  HEPATIC FUNCTION PANEL  MAGNESIUM  TROPONIN I (HIGH SENSITIVITY)    EKG None  Radiology DG Chest Port 1 View  Result Date: 08/14/2022 CLINICAL DATA:  Syncope EXAM: PORTABLE CHEST 1 VIEW COMPARISON:  Chest x-ray May 26, 2022 FINDINGS: The cardiomediastinal silhouette is unchanged in contour. Unchanged calcified right hilar lymph nodes. Stable 5 mm calcified pulmonary nodule in the right midlung. No acute focal pulmonary opacity. No pleural effusion  or pneumothorax. The visualized upper abdomen is unremarkable. No acute osseous abnormality. IMPRESSION: No acute cardiopulmonary abnormality. Electronically Signed   By: Jacob Moores M.D.   On: 08/14/2022 14:40    Procedures Procedures  {Document cardiac monitor, telemetry assessment procedure when appropriate:1}  Medications Ordered in ED Medications  sodium chloride 0.9 % bolus 1,000 mL (0 mLs Intravenous Stopped 08/14/22 1455)    ED Course/ Medical Decision Making/ A&P Clinical Course as of 08/14/22 1501  Sun Aug 14, 2022  1501 Chest x-ray interpreted by me as no acute infiltrate.  Awaiting radiology reading. [MB]    Clinical Course User Index [MB] Terrilee Files, MD   {   Click here for ABCD2, HEART and other calculatorsREFRESH Note before signing :1}                          Medical Decision Making Amount and/or Complexity of Data Reviewed Labs: ordered.   This patient complains of ***; this involves an extensive number of treatment Options and is a complaint that carries with it a high risk of complications and morbidity. The differential includes ***  I ordered, reviewed and interpreted labs, which included *** I ordered medication *** and reviewed PMP when indicated. I ordered imaging studies which included *** and I independently    visualized and interpreted imaging which showed *** Additional history obtained from *** Previous records obtained and reviewed *** I consulted *** and discussed  lab and imaging findings and discussed disposition.  Cardiac monitoring reviewed, *** Social determinants considered, *** Critical Interventions: ***  After the interventions stated above, I reevaluated the patient and found *** Admission and further testing considered, ***   {Document critical care time when appropriate:1} {Document review of labs and clinical decision tools ie heart score, Chads2Vasc2 etc:1}  {Document your independent review of radiology images, and any outside records:1} {Document your discussion with family members, caretakers, and with consultants:1} {Document social determinants of health affecting pt's care:1} {Document your decision making why or why not admission, treatments were needed:1} Final Clinical Impression(s) / ED Diagnoses Final diagnoses:  None    Rx / DC Orders ED Discharge Orders     None

## 2022-08-14 NOTE — Discharge Instructions (Signed)
You were seen in the emergency department for an episode of syncope or fainting.  You had blood work EKG chest x-ray that did not show any obvious cause of your symptoms.  Please rest and stay well-hydrated.  Follow-up with your primary care doctor.  Return to the emergency department if any worsening or concerning symptoms.

## 2022-08-22 DIAGNOSIS — R531 Weakness: Secondary | ICD-10-CM | POA: Diagnosis not present

## 2022-10-13 ENCOUNTER — Encounter (INDEPENDENT_AMBULATORY_CARE_PROVIDER_SITE_OTHER): Payer: Medicare Other | Admitting: Ophthalmology

## 2022-10-19 DIAGNOSIS — H35372 Puckering of macula, left eye: Secondary | ICD-10-CM | POA: Diagnosis not present

## 2022-10-19 DIAGNOSIS — H353132 Nonexudative age-related macular degeneration, bilateral, intermediate dry stage: Secondary | ICD-10-CM | POA: Diagnosis not present

## 2022-11-14 DIAGNOSIS — Z79899 Other long term (current) drug therapy: Secondary | ICD-10-CM | POA: Diagnosis not present

## 2022-11-14 DIAGNOSIS — F01511 Vascular dementia, unspecified severity, with agitation: Secondary | ICD-10-CM | POA: Diagnosis not present

## 2022-11-14 DIAGNOSIS — R531 Weakness: Secondary | ICD-10-CM | POA: Diagnosis not present

## 2022-11-14 DIAGNOSIS — I10 Essential (primary) hypertension: Secondary | ICD-10-CM | POA: Diagnosis not present

## 2022-11-21 DIAGNOSIS — G4700 Insomnia, unspecified: Secondary | ICD-10-CM | POA: Diagnosis not present

## 2022-11-21 DIAGNOSIS — E785 Hyperlipidemia, unspecified: Secondary | ICD-10-CM | POA: Diagnosis not present

## 2023-03-02 DIAGNOSIS — F320 Major depressive disorder, single episode, mild: Secondary | ICD-10-CM | POA: Diagnosis not present

## 2023-03-02 DIAGNOSIS — I10 Essential (primary) hypertension: Secondary | ICD-10-CM | POA: Diagnosis not present

## 2023-03-02 DIAGNOSIS — F0150 Vascular dementia without behavioral disturbance: Secondary | ICD-10-CM | POA: Diagnosis not present

## 2023-04-05 ENCOUNTER — Emergency Department (HOSPITAL_COMMUNITY): Payer: Medicare Other

## 2023-04-05 ENCOUNTER — Emergency Department (HOSPITAL_COMMUNITY)
Admission: EM | Admit: 2023-04-05 | Discharge: 2023-04-05 | Disposition: A | Discharge status: Home / Self Care | Payer: Medicare Other | Attending: Emergency Medicine | Admitting: Emergency Medicine

## 2023-04-05 DIAGNOSIS — R531 Weakness: Secondary | ICD-10-CM | POA: Diagnosis not present

## 2023-04-05 DIAGNOSIS — Z87891 Personal history of nicotine dependence: Secondary | ICD-10-CM | POA: Insufficient documentation

## 2023-04-05 DIAGNOSIS — S199XXA Unspecified injury of neck, initial encounter: Secondary | ICD-10-CM | POA: Diagnosis not present

## 2023-04-05 DIAGNOSIS — R197 Diarrhea, unspecified: Secondary | ICD-10-CM | POA: Diagnosis not present

## 2023-04-05 DIAGNOSIS — Z96641 Presence of right artificial hip joint: Secondary | ICD-10-CM | POA: Diagnosis not present

## 2023-04-05 DIAGNOSIS — J439 Emphysema, unspecified: Secondary | ICD-10-CM | POA: Diagnosis not present

## 2023-04-05 DIAGNOSIS — W19XXXA Unspecified fall, initial encounter: Secondary | ICD-10-CM | POA: Diagnosis not present

## 2023-04-05 DIAGNOSIS — Z7982 Long term (current) use of aspirin: Secondary | ICD-10-CM | POA: Insufficient documentation

## 2023-04-05 DIAGNOSIS — R1111 Vomiting without nausea: Secondary | ICD-10-CM | POA: Diagnosis not present

## 2023-04-05 DIAGNOSIS — Z743 Need for continuous supervision: Secondary | ICD-10-CM | POA: Diagnosis not present

## 2023-04-05 DIAGNOSIS — M85851 Other specified disorders of bone density and structure, right thigh: Secondary | ICD-10-CM | POA: Diagnosis not present

## 2023-04-05 DIAGNOSIS — R110 Nausea: Secondary | ICD-10-CM | POA: Diagnosis not present

## 2023-04-05 DIAGNOSIS — M25551 Pain in right hip: Secondary | ICD-10-CM | POA: Diagnosis not present

## 2023-04-05 DIAGNOSIS — I6529 Occlusion and stenosis of unspecified carotid artery: Secondary | ICD-10-CM | POA: Diagnosis not present

## 2023-04-05 DIAGNOSIS — Z79899 Other long term (current) drug therapy: Secondary | ICD-10-CM | POA: Diagnosis not present

## 2023-04-05 DIAGNOSIS — S0990XA Unspecified injury of head, initial encounter: Secondary | ICD-10-CM | POA: Diagnosis not present

## 2023-04-05 DIAGNOSIS — R410 Disorientation, unspecified: Secondary | ICD-10-CM | POA: Diagnosis not present

## 2023-04-05 DIAGNOSIS — R6889 Other general symptoms and signs: Secondary | ICD-10-CM | POA: Diagnosis not present

## 2023-04-05 DIAGNOSIS — I10 Essential (primary) hypertension: Secondary | ICD-10-CM | POA: Diagnosis not present

## 2023-04-05 LAB — CBC WITH DIFFERENTIAL/PLATELET
Abs Immature Granulocytes: 0.06 10*3/uL (ref 0.00–0.07)
Basophils Absolute: 0 10*3/uL (ref 0.0–0.1)
Basophils Relative: 0 %
Eosinophils Absolute: 0 10*3/uL (ref 0.0–0.5)
Eosinophils Relative: 0 %
HCT: 39.4 % (ref 36.0–46.0)
Hemoglobin: 13 g/dL (ref 12.0–15.0)
Immature Granulocytes: 1 %
Lymphocytes Relative: 2 %
Lymphs Abs: 0.3 10*3/uL — ABNORMAL LOW (ref 0.7–4.0)
MCH: 31 pg (ref 26.0–34.0)
MCHC: 33 g/dL (ref 30.0–36.0)
MCV: 93.8 fL (ref 80.0–100.0)
Monocytes Absolute: 0.3 10*3/uL (ref 0.1–1.0)
Monocytes Relative: 2 %
Neutro Abs: 12.5 10*3/uL — ABNORMAL HIGH (ref 1.7–7.7)
Neutrophils Relative %: 95 %
Platelets: 320 10*3/uL (ref 150–400)
RBC: 4.2 MIL/uL (ref 3.87–5.11)
RDW: 12.2 % (ref 11.5–15.5)
WBC: 13.2 10*3/uL — ABNORMAL HIGH (ref 4.0–10.5)
nRBC: 0 % (ref 0.0–0.2)

## 2023-04-05 LAB — COMPREHENSIVE METABOLIC PANEL
ALT: 42 U/L (ref 0–44)
AST: 41 U/L (ref 15–41)
Albumin: 4.4 g/dL (ref 3.5–5.0)
Alkaline Phosphatase: 89 U/L (ref 38–126)
Anion gap: 14 (ref 5–15)
BUN: 35 mg/dL — ABNORMAL HIGH (ref 8–23)
CO2: 23 mmol/L (ref 22–32)
Calcium: 9.8 mg/dL (ref 8.9–10.3)
Chloride: 102 mmol/L (ref 98–111)
Creatinine, Ser: 0.91 mg/dL (ref 0.44–1.00)
GFR, Estimated: >60 mL/min (ref >60)
Glucose, Bld: 123 mg/dL — ABNORMAL HIGH (ref 70–99)
Potassium: 3.8 mmol/L (ref 3.5–5.1)
Sodium: 139 mmol/L (ref 135–145)
Total Bilirubin: 0.8 mg/dL (ref 0.0–1.2)
Total Protein: 8.1 g/dL (ref 6.5–8.1)

## 2023-04-05 LAB — MAGNESIUM: Magnesium: 1.7 mg/dL (ref 1.7–2.4)

## 2023-04-05 LAB — CK: Total CK: 80 U/L (ref 38–234)

## 2023-04-05 MED ORDER — ONDANSETRON HCL 4 MG/2ML IJ SOLN
4.0000 mg | Freq: Once | INTRAMUSCULAR | Status: AC
  Administered 2023-04-05: 4 mg via INTRAVENOUS
  Filled 2023-04-05: qty 2

## 2023-04-05 MED ORDER — ONDANSETRON HCL 4 MG PO TABS: 4.0000 mg | ORAL_TABLET | Freq: Three times a day (TID) | ORAL | 0 refills | Status: DC | PRN

## 2023-04-05 MED ORDER — SODIUM CHLORIDE 0.9 % IV BOLUS
1000.0000 mL | Freq: Once | INTRAVENOUS | Status: AC
  Administered 2023-04-05: 1000 mL via INTRAVENOUS

## 2023-04-05 NOTE — ED Notes (Signed)
 Pt ambulates with cane. She became slightly unsteady while walking and complained of weakness. She did not fall and did not require full assist.

## 2023-04-05 NOTE — Discharge Instructions (Addendum)
 We evaluated you for your fall and diarrhea.  Your laboratory testing, x-rays and CT scans were reassuring.  We did not see any sign of any serious injury or severe dehydration.  We would recommend following up very closely with your primary doctor.  Please continue to take Imodium as needed for diarrhea.  You can also try to take fiber supplements.  We have prescribed you some additional medication called Zofran which you can take for nausea as needed.   If you have any new or worsening symptoms such as abdominal pain, uncontrolled vomiting, increased weakness, lightheadedness or dizziness, fevers or chills, or any other new symptoms, please return to the emergency department.

## 2023-04-05 NOTE — ED Provider Notes (Signed)
 [place] EMERGENCY DEPARTMENT AT [facility] Provider Note  CSN: [id number] Arrival date & time: 04/05/23 1625  Chief Complaint(s) Fall  HPI Mackenzie [name] is a 83 y.o. female history of hypertension, hyperlipidemia presenting to the emergency department weakness.  Patient reports increased weakness today, on way to the bathroom felt weak and landed on the ground, had trouble getting up.  Her son came and helped her, son was concerned that she was on the ground for a few hours.  She reports that she has been experiencing nausea, vomiting and diarrhea.  She has had diarrhea for the past few weeks but the vomiting started today, apparently ate lunch with a friend who had similar symptoms.  No hematemesis, hematochezia.  She denies any abdominal pain.  She is not sure if she hit her head.  She reports right hip pain.  Has not been up since falling.   Past Medical History Past Medical History:  Diagnosis Date   Anemia, normocytic normochromic 01/2012   hx of   Anxiety    takes Xanax daily as needed   Arthritis    knees, hands   Cataract    right eye   Depression    takes Citalopram daily   Dizziness    cardiologist is aware and told pt it was related to meds   History of bronchitis    2014   History of colon polyps    benign   History of migraine    Hyperlipidemia    takes Pravastatin daily   Hypertension    takes Metoprolol and Azor daily   Joint pain    Pneumonia    hx of-2015   Primary localized osteoarthritis of right knee 10/01/2019   Pseudogout of knee, right 02/22/2017   Renal insufficiency 02/22/2017   Rheumatic fever    hx of   Wheezing on expiration    Patient Active Problem List   Diagnosis Date Noted   H/O vitrectomy 01/16/2020   Macular pucker, left eye 12/24/2019   Intermediate stage nonexudative age-related macular degeneration of both eyes 12/24/2019   Posterior vitreous detachment of right eye 12/24/2019   Pseudophakia of both eyes 12/24/2019    Primary localized osteoarthritis of right knee 10/01/2019   Lumbar disc herniation 01/24/2018   Constipation 03/09/2017   Pseudogout of knee, right 02/22/2017   Renal insufficiency 02/22/2017   Septic joint of right knee joint (HCC) 02/21/2017   Infection of right knee (HCC) 02/06/2017   Septic arthritis (HCC) 02/06/2017   Chest pain 03/04/2015   Essential hypertension    Dyslipidemia    Primary osteoarthritis of right hip 12/16/2014   Postoperative anemia due to acute blood loss 12/16/2014   Bilateral carotid artery disease (HCC) 11/05/2014   Multiple fractures of ribs of right side 06/18/2012   Pneumothorax on right 06/18/2012   Unstable angina (HCC) 01/31/2012   Cerebrovascular disease 01/31/2012   Arteriosclerotic cardiovascular disease (ASCVD)    Hyperlipidemia    Hypertension    Anemia, normocytic normochromic 01/15/2012   Mitral valve disease 12/24/2008   Home Medication(s) Prior to Admission medications   Medication Sig Start Date End Date Taking? Authorizing Provider  ondansetron (ZOFRAN) 4 MG tablet Take 1 tablet (4 mg total) by mouth every 8 (eight) hours as needed for nausea or vomiting. 04/05/23  Yes Lonell Grandchild, MD  acetaminophen (TYLENOL) 500 MG tablet Take 1,000 mg by mouth every 6 (six) hours as needed for moderate pain or headache.    [provider]  ALPRAZolam (XANAX) 0.25 MG tablet Take 0.0625-0.25 mg by mouth 2 (two) times daily as needed for anxiety.  07/26/19   [provider]  amLODipine (NORVASC) 5 MG tablet Take 5 mg by mouth daily. 07/13/19   [provider]  aspirin EC 81 MG tablet Take 81 mg by mouth daily. Swallow whole.    [provider]  chlorthalidone (HYGROTON) 25 MG tablet Take 25 mg by mouth daily.    [provider]  citalopram (CELEXA) 10 MG tablet Take 10 mg by mouth daily.    [provider]  Colchicine 0.6 MG CAPS Take 1 capsule by mouth daily as needed (gout). 01/23/19   [provider]  Cyanocobalamin (VITAMIN B-12 IJ) Inject 1 mL as directed every 30 (thirty) days.    [provider]  dicyclomine (BENTYL) 20 MG tablet Take 1 tablet (20 mg total) by mouth 2 (two) times daily as needed for up to 15 doses for spasms. 02/10/22   Terald Sleeper, MD  docusate sodium (COLACE) 100 MG capsule 1 tab 2 times a day while on narcotics.  STOOL SOFTENER 10/12/19   Shepperson, Kirstin, PA-C  donepezil (ARICEPT) 5 MG tablet Take 5 mg by mouth daily. 01/18/22   [provider]  methylPREDNISolone (MEDROL DOSEPAK) 4 MG TBPK tablet Take as prescribed 07/05/22   Kommor, Madison, MD  metoprolol succinate (TOPROL-XL) 25 MG 24 hr tablet Take 0.5 tablets (12.5 mg total) by mouth daily. 05/26/22   Rondel Baton, MD  naproxen (NAPROSYN) 375 MG tablet Take 1 tablet (375 mg total) by mouth 2 (two) times daily. 07/05/22   Kommor, Madison, MD  polyethylene glycol (MIRALAX / GLYCOLAX) 17 g packet 17grams in 6 oz of something to drink twice a day until bowel movement.  LAXITIVE.  Restart if two days since last bowel movement 10/12/19   Shepperson, Kirstin, PA-C  potassium chloride (KLOR-CON) 10 MEQ tablet Take 1 tablet (10 mEq total) by mouth daily for 20 days. 02/10/22 03/02/22  Terald Sleeper, MD  pravastatin (PRAVACHOL) 40 MG tablet Take 40 mg by mouth at bedtime.    [provider]                                                                                                                                    Past Surgical History Past Surgical History:  Procedure Laterality Date   ABDOMINAL HYSTERECTOMY     APPENDECTOMY     CARDIAC CATHETERIZATION     early 2000's   CARDIAC CATHETERIZATION N/A 03/04/2015   Procedure: Left Heart Cath and Coronary Angiography;  Surgeon: Tonny Bollman, MD;  Location: [facility] INVASIVE CV LAB;  Service: Cardiovascular;  Laterality: N/A;   CATARACT EXTRACTION Bilateral    cataract surgery Left    CHOLECYSTECTOMY     CHONDROPLASTY  Right 02/21/2017   Procedure: CHONDROPLASTY WITH DEBRIDEMENT;  Surgeon: Salvatore Marvel, MD;  Location: [facility];  Service: Orthopedics;  Laterality: Right;   COLONOSCOPY  2005   Negative screening study   COLONOSCOPY N/A 04/04/2014   Procedure: COLONOSCOPY;  Surgeon: Malissa Hippo, MD;  Location: [facility];  Service: Endoscopy;  Laterality: N/A;  1225   EXPLORATORY LAPAROTOMY     x 3   EYE SURGERY     IR GENERIC HISTORICAL  10/15/2015   IR RADIOLOGIST EVAL & MGMT 10/15/2015 [facility]   IR GENERIC HISTORICAL  10/20/2015   IR KYPHO LUMBAR INC FX REDUCE BONE BX UNI/BIL CANNULATION INC/IMAGING 10/20/2015 Julieanne Cotton, MD [facility]   IRRIGATION AND DEBRIDEMENT KNEE Right 02/21/2017   Procedure: IRRIGATION AND DEBRIDEMENT KNEE;  Surgeon: Salvatore Marvel, MD;  Location: [facility];  Service: Orthopedics;  Laterality: Right;   LUMBAR LAMINECTOMY/DECOMPRESSION MICRODISCECTOMY Left 01/24/2018   Procedure: LEFT LUMBAR FOUR- LUMBAR FIVE  LAMINOTOMY AND MICRODISCECTOMY;  Surgeon: Shirlean Kelly, MD;  Location[facility] OR;  Service: Neurosurgery;  Laterality: Left;  LEFT LUMBAR 4- LUMBAR 5  LAMINOTOMY AND MICRODISCECTOMY   TOTAL HIP ARTHROPLASTY Right 12/16/2014   Procedure: TOTAL HIP ARTHROPLASTY ANTERIOR APPROACH;  Surgeon: Sheral Apley, MD;  Location: [facility] OR;  Service: Orthopedics;  Laterality: Right;   TOTAL KNEE ARTHROPLASTY Right 10/11/2019   Procedure: TOTAL KNEE ARTHROPLASTY;  Surgeon: Salvatore Marvel, MD;  Location: [facility] ORS;  Service: Orthopedics;  Laterality: Right;   Family History Family History  Problem Relation Age of Onset   Heart attack Mother 40       heart problems   Cancer Mother        uterus   Pancreatitis Sister        died  age 9   Colon cancer Maternal Aunt 43   Colon cancer Maternal Aunt 78   Colon cancer Maternal Aunt 31   Cancer Other        FH   Diabetes Other        FH   Hypertension Other        FH   Diabetes Son    Diabetes  Daughter    Colon cancer Maternal Uncle 35    Social History Social History   Tobacco Use   Smoking status: Former    Current packs/day: 1.00    Average packs/day: 1 pack/day for 15.0 years (15.0 ttl pk-yrs)    Types: Cigarettes   Smokeless tobacco: Never   Tobacco comments:    quit smoking 30+yrs ago  Vaping Use   Vaping status: Never Used  Substance Use Topics   Alcohol use: Yes    Alcohol/week: 3.0 standard drinks of alcohol    Types: 3 Glasses of wine per week    Comment: socially wine   Drug use: No   Allergies Nickel, Palladium chloride, Statins, Bacitracin, and Gold sodium thiosulfate  Review of Systems Review of Systems  All other systems reviewed and are negative.   Physical Exam Vital Signs  I have reviewed the triage vital signs BP (!) 142/56 (BP Location: Right Arm)   Pulse 80   Temp 98.4 F (36.9 C) (Oral)   Resp 16   SpO2 100%  Physical Exam Vitals and nursing note reviewed.  Constitutional:      General: She is not in acute distress.    Appearance: She is well-developed.  HENT:     Head: Normocephalic and atraumatic.     Mouth/Throat:     Mouth: Mucous membranes are moist.  Eyes:  Pupils: Pupils are equal, round, and reactive to light.  Cardiovascular:     Rate and Rhythm: Normal rate and regular rhythm.     Heart sounds: No murmur heard. Pulmonary:     Effort: Pulmonary effort is normal. No respiratory distress.     Breath sounds: Normal breath sounds.  Abdominal:     General: Abdomen is flat.     Palpations: Abdomen is soft.     Tenderness: There is no abdominal tenderness.  Musculoskeletal:        General: No tenderness.     Right lower leg: No edema.     Left lower leg: No edema.     Comments: Slightly painful range of motion of the right hip  Skin:    General: Skin is warm and dry.  Neurological:     General: No focal deficit present.     Mental Status: She is alert. Mental status is at baseline.  Psychiatric:        Mood  and Affect: Mood normal.        Behavior: Behavior normal.     ED Results and Treatments Labs (all labs ordered are listed, but only abnormal results are displayed) Labs Reviewed  COMPREHENSIVE METABOLIC PANEL - Abnormal; Notable for the following components:      Result Value   Glucose, Bld 123 (*)    BUN 35 (*)    All other components within normal limits  CBC WITH DIFFERENTIAL/PLATELET - Abnormal; Notable for the following components:   WBC 13.2 (*)    Neutro Abs 12.5 (*)    Lymphs Abs 0.3 (*)    All other components within normal limits  GASTROINTESTINAL PANEL BY PCR, STOOL (REPLACES STOOL CULTURE)  C DIFFICILE QUICK SCREEN W PCR REFLEX    MAGNESIUM  CK                                                                                                                          Radiology DG Chest Portable 1 View Result Date: 04/05/2023 CLINICAL DATA:  Pain out to eat yesterday with a friend and then fell ill with nausea, diarrhea, and weakness. Fall EXAM: PORTABLE CHEST 1 VIEW COMPARISON:  Chest x-ray 08/14/2022, chest x-ray 03/27/2020 FINDINGS: The heart and mediastinal contours are unchanged. Atherosclerotic plaque. Hyperinflation of the lungs. Similar chronic peripheral calcified 7 mm pulmonary nodule likely a granuloma. No focal consolidation. No pulmonary edema. No pleural effusion. No pneumothorax. No acute osseous abnormality. IMPRESSION: 1. No active disease. 2. Aortic Atherosclerosis (ICD10-I70.0) and Emphysema (ICD10-J43.9). Electronically Signed   By: Tish Frederickson M.D.   On: 04/05/2023 18:14   DG Hip Unilat W or Wo Pelvis 2-3 Views Right Result Date: 04/05/2023 CLINICAL DATA:  Right hip pain. EXAM: DG HIP (WITH OR WITHOUT PELVIS) 2-3V RIGHT COMPARISON:  None Available. FINDINGS: There is a total right hip arthroplasty. The arthroplasty components appear intact and in anatomic alignment. There is no acute fracture or dislocation. The bones  are osteopenic. The soft tissues are  unremarkable. IMPRESSION: 1. No acute fracture or dislocation. 2. Total right hip arthroplasty. Electronically Signed   By: Elgie Collard M.D.   On: 04/05/2023 18:09   CT Head Wo Contrast Result Date: 04/05/2023 CLINICAL DATA:  Head trauma, minor (Age >= 65y); Neck trauma (Age >= 65y). Fall. EXAM: CT HEAD WITHOUT CONTRAST CT CERVICAL SPINE WITHOUT CONTRAST TECHNIQUE: Multidetector CT imaging of the head and cervical spine was performed following the standard protocol without intravenous contrast. Multiplanar CT image reconstructions of the cervical spine were also generated. RADIATION DOSE REDUCTION: This exam was performed according to the departmental dose-optimization program which includes automated exposure control, adjustment of the mA and/or kV according to patient size and/or use of iterative reconstruction technique. COMPARISON:  CT head and cervical spine 12/15/2020. FINDINGS: CT HEAD FINDINGS Brain: No acute hemorrhage. Stable background of moderate chronic small-vessel disease. Cortical gray-white differentiation is otherwise preserved. Prominence of the ventricles and sulci within expected range for age. No hydrocephalus or extra-axial collection. No mass effect or midline shift. Vascular: No hyperdense vessel or unexpected calcification. Skull: No calvarial fracture or suspicious bone lesion. Skull base is unremarkable. Sinuses/Orbits: No acute finding. Other: None. CT CERVICAL SPINE FINDINGS Alignment: 3 mm anterolisthesis of C4 on C5, likely degenerative. Skull base and vertebrae: No acute fracture. Normal craniocervical junction. No suspicious bone lesions. Soft tissues and spinal canal: No prevertebral fluid or swelling. No visible canal hematoma. Disc levels: Mild cervical spondylosis without high-grade spinal canal stenosis. Upper chest: No acute findings. Other: Atherosclerotic calcifications of the carotid bulbs. IMPRESSION: 1. No acute intracranial abnormality. Stable background of  moderate chronic small-vessel disease. 2. No acute cervical spine fracture or traumatic listhesis. Electronically Signed   By: Orvan Falconer M.D.   On: 04/05/2023 17:54   CT Cervical Spine Wo Contrast Result Date: 04/05/2023 CLINICAL DATA:  Head trauma, minor (Age >= 65y); Neck trauma (Age >= 65y). Fall. EXAM: CT HEAD WITHOUT CONTRAST CT CERVICAL SPINE WITHOUT CONTRAST TECHNIQUE: Multidetector CT imaging of the head and cervical spine was performed following the standard protocol without intravenous contrast. Multiplanar CT image reconstructions of the cervical spine were also generated. RADIATION DOSE REDUCTION: This exam was performed according to the departmental dose-optimization program which includes automated exposure control, adjustment of the mA and/or kV according to patient size and/or use of iterative reconstruction technique. COMPARISON:  CT head and cervical spine 12/15/2020. FINDINGS: CT HEAD FINDINGS Brain: No acute hemorrhage. Stable background of moderate chronic small-vessel disease. Cortical gray-white differentiation is otherwise preserved. Prominence of the ventricles and sulci within expected range for age. No hydrocephalus or extra-axial collection. No mass effect or midline shift. Vascular: No hyperdense vessel or unexpected calcification. Skull: No calvarial fracture or suspicious bone lesion. Skull base is unremarkable. Sinuses/Orbits: No acute finding. Other: None. CT CERVICAL SPINE FINDINGS Alignment: 3 mm anterolisthesis of C4 on C5, likely degenerative. Skull base and vertebrae: No acute fracture. Normal craniocervical junction. No suspicious bone lesions. Soft tissues and spinal canal: No prevertebral fluid or swelling. No visible canal hematoma. Disc levels: Mild cervical spondylosis without high-grade spinal canal stenosis. Upper chest: No acute findings. Other: Atherosclerotic calcifications of the carotid bulbs. IMPRESSION: 1. No acute intracranial abnormality. Stable  background of moderate chronic small-vessel disease. 2. No acute cervical spine fracture or traumatic listhesis. Electronically Signed   By: Orvan Falconer M.D.   On: 04/05/2023 17:54    Pertinent labs & imaging results that were available during my care of  the patient were reviewed by me and considered in my medical decision making (see MDM for details).  Medications Ordered in ED Medications  sodium chloride 0.9 % bolus 1,000 mL (0 mLs Intravenous Stopped 04/05/23 1846)  ondansetron (ZOFRAN) injection 4 mg (4 mg Intravenous Given 04/05/23 1729)                                                                                                                                     Procedures Procedures  (including critical care time)  Medical Decision Making / ED Course   MDM:  83 year old presenting to the emergency department with weakness, fall.  Patient well-appearing, physical examination with no abdominal tenderness.  Vitals are reassuring.  No hypotension, no tachycardia.  She has some mild painful range of motion of the right hip without deformity or signs of injury.  Suspect symptoms are due to her gastrointestinal illness, dehydration.  She received IV fluids, and reports improvement in her symptoms.  No bloody stools, tenderness to suggest invasive diarrhea, colitis, diverticulitis, and patient denies any abdominal pain.  She has been able to tolerate p.o. in the emergency department.  She was able to ambulate without difficulty.  Given her fall, CT head C-spine, chest x-ray, and x-ray of the hip were obtained without evidence of acute traumatic injury.  Given that she was on the ground for an unknown period of time obtain CK level which was normal.  Given patient has had some diarrhea for the past 3 weeks, considered obtaining stool studies, which were ordered.  During her stay in the emergency department, patient had no additional bowel movements.  Since she is not having enough  diarrhea to provide a stool sample here, low concern for C. difficile or other severe diarrheal process.  Given improvement, feel patient is stable for discharge, she is going to stay with her son tonight.  Patient and son understand strict ER precautions for any new or worsening symptoms and recommended close follow-up with her primary physician. Will discharge patient to home. All questions answered. Patient comfortable with plan of discharge. Return precautions discussed with patient and specified on the after visit summary.       Additional history obtained: -Additional history obtained from family and ems -External records from outside source obtained and reviewed including: Chart review including previous notes, labs, imaging, consultation notes including prior notes    Lab Tests: -I ordered, reviewed, and interpreted labs.   The pertinent results include:   Labs Reviewed  COMPREHENSIVE METABOLIC PANEL - Abnormal; Notable for the following components:      Result Value   Glucose, Bld 123 (*)    BUN 35 (*)    All other components within normal limits  CBC WITH DIFFERENTIAL/PLATELET - Abnormal; Notable for the following components:   WBC 13.2 (*)    Neutro Abs 12.5 (*)    Lymphs Abs 0.3 (*)    All  other components within normal limits  GASTROINTESTINAL PANEL BY PCR, STOOL (REPLACES STOOL CULTURE)  C DIFFICILE QUICK SCREEN W PCR REFLEX    MAGNESIUM  CK    Notable for mild nonspecific leukocytosis   EKG   EKG Interpretation Date/Time:  Wednesday April 05 2023 18:01:24 EST Ventricular Rate:  63 PR Interval:  32 QRS Duration:  104 QT Interval:  400 QTC Calculation: 410 R Axis:   81  Text Interpretation: Sinus rhythm Short PR interval Consider right ventricular hypertrophy Nonspecific T abnrm, anterolateral leads No significant change since last tracing Confirmed by Alvino Blood ([id number]) on 04/05/2023 6:07:29 PM         Imaging Studies ordered: I ordered  imaging studies including CT head, CT c spine, XR chest, XR hip On my interpretation imaging demonstrates no acute injury  I independently visualized and interpreted imaging. I agree with the radiologist interpretation   Medicines ordered and prescription drug management: Meds ordered this encounter  Medications   sodium chloride 0.9 % bolus 1,000 mL   ondansetron (ZOFRAN) injection 4 mg   ondansetron (ZOFRAN) 4 MG tablet    Sig: Take 1 tablet (4 mg total) by mouth every 8 (eight) hours as needed for nausea or vomiting.    Dispense:  12 tablet    Refill:  0    -I have reviewed the patients home medicines and have made adjustments as needed   Cardiac Monitoring: The patient was maintained on a cardiac monitor.  I personally viewed and interpreted the cardiac monitored which showed an underlying rhythm of: NSR  Social Determinants of Health:  Diagnosis or treatment significantly limited by social determinants of health: lives alone   Reevaluation: After the interventions noted above, I reevaluated the patient and found that their symptoms have improved  Co morbidities that complicate the patient evaluation  Past Medical History:  Diagnosis Date   Anemia, normocytic normochromic 01/2012   hx of   Anxiety    takes Xanax daily as needed   Arthritis    knees, hands   Cataract    right eye   Depression    takes Citalopram daily   Dizziness    cardiologist is aware and told pt it was related to meds   History of bronchitis    2014   History of colon polyps    benign   History of migraine    Hyperlipidemia    takes Pravastatin daily   Hypertension    takes Metoprolol and Azor daily   Joint pain    Pneumonia    hx of-2015   Primary localized osteoarthritis of right knee 10/01/2019   Pseudogout of knee, right 02/22/2017   Renal insufficiency 02/22/2017   Rheumatic fever    hx of   Wheezing on expiration       Dispostion: Disposition decision including need for  hospitalization was considered, and patient discharged from emergency department.    Final Clinical Impression(s) / ED Diagnoses Final diagnoses:  Diarrhea, unspecified type  Generalized weakness     This chart was dictated using voice recognition software.  Despite best efforts to proofread,  errors can occur which can change the documentation meaning.    Lonell Grandchild, MD 04/05/23 [phone]

## 2023-04-05 NOTE — ED Triage Notes (Signed)
 Pt arrives via [facility] from home for a fall. Pt reports that she went out to eat yesterday with a friend and then fell ill with nausea, diarrhea, and weakness, which the friend is reportedly experiencing as well. Pt reports multiple falls. Was found at home on the carpeted floor in her bedroom by son, unknown how long she laid there but he suspected several hours. Pt denies head injury/LOC. No blood thinners per pt. She is c/o R hip pain.

## 2023-04-19 DIAGNOSIS — H353132 Nonexudative age-related macular degeneration, bilateral, intermediate dry stage: Secondary | ICD-10-CM | POA: Diagnosis not present

## 2023-04-19 DIAGNOSIS — H43811 Vitreous degeneration, right eye: Secondary | ICD-10-CM | POA: Diagnosis not present

## 2023-04-19 DIAGNOSIS — H35372 Puckering of macula, left eye: Secondary | ICD-10-CM | POA: Diagnosis not present

## 2023-04-21 DIAGNOSIS — N3000 Acute cystitis without hematuria: Secondary | ICD-10-CM | POA: Diagnosis not present

## 2023-05-30 DIAGNOSIS — F0150 Vascular dementia without behavioral disturbance: Secondary | ICD-10-CM | POA: Diagnosis not present

## 2023-05-30 DIAGNOSIS — I10 Essential (primary) hypertension: Secondary | ICD-10-CM | POA: Diagnosis not present

## 2023-08-15 DIAGNOSIS — R197 Diarrhea, unspecified: Secondary | ICD-10-CM | POA: Diagnosis not present

## 2023-08-17 DIAGNOSIS — H43811 Vitreous degeneration, right eye: Secondary | ICD-10-CM | POA: Diagnosis not present

## 2023-08-17 DIAGNOSIS — H353132 Nonexudative age-related macular degeneration, bilateral, intermediate dry stage: Secondary | ICD-10-CM | POA: Diagnosis not present

## 2023-08-17 DIAGNOSIS — H35372 Puckering of macula, left eye: Secondary | ICD-10-CM | POA: Diagnosis not present

## 2023-08-17 DIAGNOSIS — H33192 Other retinoschisis and retinal cysts, left eye: Secondary | ICD-10-CM | POA: Diagnosis not present

## 2023-08-24 ENCOUNTER — Emergency Department (HOSPITAL_COMMUNITY)
Admission: EM | Admit: 2023-08-24 | Discharge: 2023-08-24 | Disposition: A | Discharge status: Home / Self Care | Attending: Emergency Medicine | Admitting: Emergency Medicine

## 2023-08-24 ENCOUNTER — Emergency Department (HOSPITAL_COMMUNITY)

## 2023-08-24 ENCOUNTER — Other Ambulatory Visit: Payer: Self-pay

## 2023-08-24 DIAGNOSIS — I10 Essential (primary) hypertension: Secondary | ICD-10-CM | POA: Insufficient documentation

## 2023-08-24 DIAGNOSIS — F0390 Unspecified dementia without behavioral disturbance: Secondary | ICD-10-CM | POA: Diagnosis not present

## 2023-08-24 DIAGNOSIS — R569 Unspecified convulsions: Secondary | ICD-10-CM | POA: Insufficient documentation

## 2023-08-24 DIAGNOSIS — R6889 Other general symptoms and signs: Secondary | ICD-10-CM | POA: Diagnosis not present

## 2023-08-24 DIAGNOSIS — I6782 Cerebral ischemia: Secondary | ICD-10-CM | POA: Diagnosis not present

## 2023-08-24 DIAGNOSIS — Z743 Need for continuous supervision: Secondary | ICD-10-CM | POA: Diagnosis not present

## 2023-08-24 DIAGNOSIS — R001 Bradycardia, unspecified: Secondary | ICD-10-CM | POA: Diagnosis not present

## 2023-08-24 DIAGNOSIS — Z79899 Other long term (current) drug therapy: Secondary | ICD-10-CM | POA: Diagnosis not present

## 2023-08-24 LAB — CBC WITH DIFFERENTIAL/PLATELET
Abs Immature Granulocytes: 0.06 K/uL (ref 0.00–0.07)
Basophils Absolute: 0 K/uL (ref 0.0–0.1)
Basophils Relative: 0 %
Eosinophils Absolute: 0 K/uL (ref 0.0–0.5)
Eosinophils Relative: 0 %
HCT: 36.6 % (ref 36.0–46.0)
Hemoglobin: 12.1 g/dL (ref 12.0–15.0)
Immature Granulocytes: 1 %
Lymphocytes Relative: 7 %
Lymphs Abs: 0.6 K/uL — ABNORMAL LOW (ref 0.7–4.0)
MCH: 31.8 pg (ref 26.0–34.0)
MCHC: 33.1 g/dL (ref 30.0–36.0)
MCV: 96.1 fL (ref 80.0–100.0)
Monocytes Absolute: 0.4 K/uL (ref 0.1–1.0)
Monocytes Relative: 4 %
Neutro Abs: 7.7 K/uL (ref 1.7–7.7)
Neutrophils Relative %: 88 %
Platelets: 354 K/uL (ref 150–400)
RBC: 3.81 MIL/uL — ABNORMAL LOW (ref 3.87–5.11)
RDW: 12.1 % (ref 11.5–15.5)
WBC: 8.8 K/uL (ref 4.0–10.5)
nRBC: 0 % (ref 0.0–0.2)

## 2023-08-24 LAB — COMPREHENSIVE METABOLIC PANEL WITH GFR
ALT: 12 U/L (ref 0–44)
AST: 18 U/L (ref 15–41)
Albumin: 3.7 g/dL (ref 3.5–5.0)
Alkaline Phosphatase: 68 U/L (ref 38–126)
Anion gap: 13 (ref 5–15)
BUN: 28 mg/dL — ABNORMAL HIGH (ref 8–23)
CO2: 25 mmol/L (ref 22–32)
Calcium: 9.4 mg/dL (ref 8.9–10.3)
Chloride: 101 mmol/L (ref 98–111)
Creatinine, Ser: 0.86 mg/dL (ref 0.44–1.00)
GFR, Estimated: >60 mL/min (ref >60)
Glucose, Bld: 135 mg/dL — ABNORMAL HIGH (ref 70–99)
Potassium: 3.9 mmol/L (ref 3.5–5.1)
Sodium: 139 mmol/L (ref 135–145)
Total Bilirubin: 0.4 mg/dL (ref 0.0–1.2)
Total Protein: 7.6 g/dL (ref 6.5–8.1)

## 2023-08-24 LAB — MAGNESIUM: Magnesium: 1.8 mg/dL (ref 1.7–2.4)

## 2023-08-24 LAB — CBG MONITORING, ED
Glucose-Capillary: 109 mg/dL — ABNORMAL HIGH (ref 70–99)
Glucose-Capillary: 108 mg/dL — ABNORMAL HIGH (ref 70–99)

## 2023-08-24 MED ORDER — LEVETIRACETAM (KEPPRA) 500 MG/5 ML ADULT IV PUSH
1000.0000 mg | Freq: Once | INTRAVENOUS | Status: AC
  Administered 2023-08-24: 1000 mg via INTRAVENOUS
  Filled 2023-08-24: qty 10

## 2023-08-24 MED ORDER — LEVETIRACETAM 500 MG PO TABS: 500.0000 mg | ORAL_TABLET | Freq: Two times a day (BID) | ORAL | 2 refills | Status: DC

## 2023-08-24 NOTE — ED Provider Notes (Signed)
 [place] EMERGENCY DEPARTMENT AT [facility] Provider Note   CSN: [id number] Arrival date & time: 08/24/23  1420     Patient presents with: No chief complaint on file.   Mackenzie [name] is a 83 y.o. female.   HPI   This patient is a very pleasant 83 year old female, she has a history of hypertension, some dementia although she does live by herself and some high cholesterol.  Reportedly the patient has had a couple of TIAs recently, although review of the medical record does not show that she has been seen any hospital since February when she was seen here for diarrhea in the year before when she was seen here for syncope.  The patient does endorse having some occasional nausea and diarrhea which is persistent.  She has had no fevers chills abdominal pain chest pain coughing or shortness of breath, she has no headache, she does feel generally weak, reportedly a friend named [name] had seen her this morning and thought that she might be having seizure-like activity, this person was not available, the patient does not know his phone number, the paramedics did not report any tonic-clonic activity just some tremor while the patient was awake.  The patient is a very poor historian and does not add anything else to this history, likely related to some dementia  Prior to Admission medications   Medication Sig Start Date End Date Taking? Authorizing Provider  acetaminophen  (TYLENOL ) 500 MG tablet Take 1,000 mg by mouth every 6 (six) hours as needed for moderate pain or headache.   Yes [provider]  ALPRAZolam  (XANAX ) 0.25 MG tablet Take 0.0625-0.25 mg by mouth 2 (two) times daily as needed for anxiety.  07/26/19  Yes [provider]  amLODipine  (NORVASC ) 5 MG tablet Take 5 mg by mouth daily. 07/13/19  Yes [provider]  aspirin  EC 81 MG tablet Take 81 mg by mouth daily. Swallow whole.   Yes [provider]  chlorthalidone  (HYGROTON ) 25 MG tablet Take 25 mg  by mouth daily.   Yes [provider]  citalopram  (CELEXA ) 10 MG tablet Take 10 mg by mouth daily.   Yes [provider]  Colchicine 0.6 MG CAPS Take 1 capsule by mouth daily as needed (gout). 01/23/19  Yes [provider]  cyanocobalamin (VITAMIN B12) 1000 MCG/ML injection Inject 1,000 mcg into the muscle every 30 (thirty) days. 03/14/23  Yes [provider]  donepezil (ARICEPT) 10 MG tablet Take 10 mg by mouth at bedtime. 06/07/22  Yes [provider]  levETIRAcetam  (KEPPRA ) 500 MG tablet Take 1 tablet (500 mg total) by mouth 2 (two) times daily. 08/24/23  Yes Cleotilde Rogue, MD  metoprolol  succinate (TOPROL -XL) 25 MG 24 hr tablet Take 0.5 tablets (12.5 mg total) by mouth daily. Patient taking differently: Take 25 mg by mouth daily. 05/26/22  Yes Yolande Lamar BROCKS, MD  pravastatin  (PRAVACHOL ) 40 MG tablet Take 40 mg by mouth at bedtime.   Yes [provider]    Allergies: Nickel, Statins, Bacitracin, Gold sodium thiosulfate, and Palladium chloride    Review of Systems  All other systems reviewed and are negative.   Updated Vital Signs There were no vitals taken for this visit.  Physical Exam Vitals and nursing note reviewed.  Constitutional:      General: She is not in acute distress.    Appearance: She is well-developed.  HENT:     Head: Normocephalic and atraumatic.     Mouth/Throat:  Pharynx: No oropharyngeal exudate.  Eyes:     General: No scleral icterus.       Right eye: No discharge.        Left eye: No discharge.     Conjunctiva/sclera: Conjunctivae normal.     Pupils: Pupils are equal, round, and reactive to light.  Neck:     Thyroid : No thyromegaly.     Vascular: No JVD.  Cardiovascular:     Rate and Rhythm: Normal rate and regular rhythm.     Heart sounds: Normal heart sounds. No murmur heard.    No friction rub. No gallop.  Pulmonary:     Effort: Pulmonary effort is normal. No respiratory distress.      Breath sounds: Normal breath sounds. No wheezing or rales.  Abdominal:     General: Bowel sounds are normal. There is no distension.     Palpations: Abdomen is soft. There is no mass.     Tenderness: There is no abdominal tenderness.  Musculoskeletal:        General: No tenderness. Normal range of motion.     Cervical back: Normal range of motion and neck supple.     Right lower leg: No edema.     Left lower leg: No edema.  Lymphadenopathy:     Cervical: No cervical adenopathy.  Skin:    General: Skin is warm and dry.     Findings: No erythema or rash.  Neurological:     Mental Status: She is alert.     Coordination: Coordination normal.     Comments: This patient is able to move all 4 extremities to command, she is able to sit up by herself in the bed, she can perform finger-nose-finger without tremor or dysmetria, cranial nerves III through XII are normal, speech is clear, there is no slurring of the words, memory has some deficits  Psychiatric:        Behavior: Behavior normal.     (all labs ordered are listed, but only abnormal results are displayed) Labs Reviewed  CBC WITH DIFFERENTIAL/PLATELET - Abnormal; Notable for the following components:      Result Value   RBC 3.81 (*)    Lymphs Abs 0.6 (*)    All other components within normal limits  COMPREHENSIVE METABOLIC PANEL WITH GFR - Abnormal; Notable for the following components:   Glucose, Bld 135 (*)    BUN 28 (*)    All other components within normal limits  CBG MONITORING, ED - Abnormal; Notable for the following components:   Glucose-Capillary 109 (*)    All other components within normal limits  MAGNESIUM   URINALYSIS, ROUTINE W REFLEX MICROSCOPIC    EKG: EKG Interpretation Date/Time:  Thursday August 24 2023 16:00:03 EDT Ventricular Rate:  60 PR Interval:  210 QRS Duration:  100 QT Interval:  454 QTC Calculation: 454 R Axis:   73  Text Interpretation: Sinus rhythm since last tracing no significant change  Confirmed by Cleotilde Rogue ([id number]) on 08/24/2023 4:56:01 PM  Radiology: CT Head Wo Contrast Result Date: 08/24/2023 CLINICAL DATA:  Possible seizure mental status change EXAM: CT HEAD WITHOUT CONTRAST TECHNIQUE: Contiguous axial images were obtained from the base of the skull through the vertex without intravenous contrast. RADIATION DOSE REDUCTION: This exam was performed according to the departmental dose-optimization program which includes automated exposure control, adjustment of the mA and/or kV according to patient size and/or use of iterative reconstruction technique. COMPARISON:  CT brain 04/05/2023, MRI 12/07/2020 FINDINGS: Brain: No acute  territorial infarction, hemorrhage or intracranial mass. Moderate atrophy. Mild to moderate patchy white matter hypodensity consistent with chronic small vessel ischemic change. Stable ventricle size. 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 Vascular: No hyperdense vessels.  Carotid vascular calcification Skull: Normal. Negative for fracture or focal lesion. Sinuses/Orbits: No acute finding. Other: None IMPRESSION: 1. No CT evidence for acute intracranial abnormality. 2. Atrophy and chronic small vessel ischemic changes of the white matter. Electronically Signed   By: Luke Bun M.D.   On: 08/24/2023 15:43     Procedures   Medications Ordered in the ED  levETIRAcetam  (KEPPRA ) undiluted injection 1,000 mg (1,000 mg Intravenous Given 08/24/23 1731)                                    Medical Decision Making Amount and/or Complexity of Data Reviewed Labs: ordered. Radiology: ordered. ECG/medicine tests: ordered.  Risk Prescription drug management.    This patient presents to the ED for concern of shaking-like episodes, this involves an extensive number of treatment options, and is a complaint that carries with it a high risk of complications and morbidity.  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  The pertinent results include: Glucose is normal, CBC and metabolic panel are rather reassuring and unremarkable, magnesium  level is normal   Imaging Studies ordered:  I ordered imaging studies including CT scan of the brain, ischemic chronic findings, nothing new I independently visualized and interpreted imaging which showed no acute findings I agree with the radiologist interpretation   Cardiac Monitoring: / EKG:  The patient was maintained on a cardiac monitor.  I personally viewed and interpreted the cardiac monitored which showed an underlying rhythm of: Normal sinus rhythm   Problem List / ED Course / Critical interventions / Medication management  The patient has been well-appearing ever since arrival, I have confirmed with the patient's significant other and son at the bedside that she has had a progressive decline over months in fact it has been over about a year and has been diagnosed with vascular dementia. I ordered medication including Keppra  at the request of the neurology specialist Reevaluation of the patient after these medicines showed that the patient no signs of seizure activity while the patient has been here I have reviewed the patients home medicines and have made adjustments as needed   Consultations  Obtained:  I requested consultation with the neuro hospitalist Dr. Arora,  and discussed lab and imaging findings as well as pertinent plan - they recommend: keppra , and f/u with [facility] as outpt - or if she needs to be admitted - let them know to have teleneuro see in AM and get EEG in house tomorrow.     Social Determinants of Health:  Elderly - som vascular dementia   Test / Admission -  Considered:  The patient is stable for discharge, no seizure activity seen while the patient has been here, I have reviewed the patient's vital signs at the bedside multiple times, I have asked them to be published by the nurse, she has stable vital signs, no bradycardia tachycardia hypotension or fever Outpatient referral made ambulatory referral to neurology      Final diagnoses:  Seizure-like activity [facility])    ED Discharge Orders          Ordered    levETIRAcetam  (KEPPRA ) 500 MG tablet  2 times daily        08/24/23 1852    Ambulatory referral to Neurology       Comments: An appointment is requested in approximately: 1 week   08/24/23 1852               Cleotilde Rogue, MD 08/24/23 [id number]

## 2023-08-24 NOTE — ED Triage Notes (Signed)
 Pt arrived via [facility] from home, per pts spouse, he thought he witnessed her having a seizure as she began shaking, pt still has a tremor at this time. Per EMS, pt has had two TIAs in the past week and is slightly altered at baseline, cbg 133, BP 135/85

## 2023-08-24 NOTE — ED Notes (Signed)
 Pt is A&O to person, place, and able to state DOB, when asked about the year she states gosh I cannot think right now. Pt also does not have a Hx of seizures

## 2023-08-24 NOTE — Discharge Instructions (Addendum)
 Please call the offices of the neurology clinic, see the phone number above, you should be seen within the next 1 to 2 weeks.  Please try taking Keppra  twice a day, return to the ER immediately for severe worsening symptoms  Thank you for allowing us  to treat you in the emergency department today.  After reviewing your examination and potential testing that was done it appears that you are safe to go home.  I would like for you to follow-up with your doctor within the next several days, have them obtain your records and follow-up with them to review all potential tests and results from your visit.  If you should develop severe or worsening symptoms return to the emergency department immediately

## 2023-08-30 ENCOUNTER — Telehealth: Payer: Self-pay

## 2023-08-30 DIAGNOSIS — I10 Essential (primary) hypertension: Secondary | ICD-10-CM

## 2023-08-31 ENCOUNTER — Ambulatory Visit (INDEPENDENT_AMBULATORY_CARE_PROVIDER_SITE_OTHER): Admitting: Neurology

## 2023-08-31 ENCOUNTER — Encounter: Payer: Self-pay | Admitting: Neurology

## 2023-08-31 VITALS — BP 122/64 | HR 53 | Wt 97.5 lb

## 2023-08-31 DIAGNOSIS — F01A18 Vascular dementia, mild, with other behavioral disturbance: Secondary | ICD-10-CM | POA: Diagnosis not present

## 2023-08-31 DIAGNOSIS — R569 Unspecified convulsions: Secondary | ICD-10-CM | POA: Diagnosis not present

## 2023-08-31 MED ORDER — LEVETIRACETAM 250 MG PO TABS: 250.0000 mg | ORAL_TABLET | Freq: Two times a day (BID) | ORAL | 3 refills | Status: AC

## 2023-08-31 NOTE — Patient Instructions (Signed)
 Decrease Keppra  to 250 mg twice daily and monitor for seizure-like activity Routine EEG, I will contact you to go over the results.  Hold Aricept for 2 to 3 weeks and monitor for diarrhea, might consider different anticholinesterase inhibitor such as rivastigmine Continue your other medications Continue follow with PCP Return in 6 months or sooner if worse

## 2023-08-31 NOTE — Progress Notes (Signed)
 [facility]  PATIENT: Mackenzie [name] DOB: 30-Apr-1940  REQUESTING CLINICIAN: Cleotilde Rogue, MD HISTORY FROM: Patient/Partner and Son  REASON FOR VISIT: Seizure    HISTORICAL  CHIEF COMPLAINT:  Chief Complaint  Patient presents with   New Patient (Initial Visit)    Rm12, son and boyfriend present, Sz:pt  stated on 08/24/23 went to [employer]. Pt son and boyfriend stated that she had slept late and they found her still in bed but urinated and defecated in the bed and the family thinks may have had sz in bed. Later she took a shower and had a sz in front of boyfriend.     HISTORY OF PRESENT ILLNESS:  Discussed the use of AI scribe software for clinical note transcription with the patient, who gave verbal consent to proceed.  Mackenzie [name] is an 83 year old female with vascular dementia, hypertension, Hyperlipidemia, depression who presents with episodes of altered mental status and possible seizures.  Last Thursday, she experienced an episode while sitting on the sofa at home, characterized by difficulty breathing, closed eyes, and a rolled tongue. This episode lasted less than a minute, during which she was initially unresponsive but then began to respond and breathe better. An ambulance was called, and she was taken to the hospital where she had another smaller episode, becoming quiet with a 'stroke phase' appearance and locked jaws, lasting 30 seconds to a minute.  She was started on levetiracetam  (Keppra ) 500 mg twice a day for seizures, which she began taking on Friday. Her partner reports no noticeable difference in her condition since starting the medication, although she seems to sleep more and wants to go to bed earlier. They deny any previous history of seizure or seizure like events.   She has a history of vascular dementia, affecting her memory and perception of her home. She often wants to 'go home' despite being in her house of 50 years. Her memory is selective,  and she sometimes does not recognize familiar places or people. She is on Aricept for memory issues.  She experiences urinary and bowel incontinence, particularly during sleep. Her partner notes severe diarrhea for a couple of months, which was present before starting Aricept, but may have worsened with it. She has not had any recent falls but uses a cane for mobility.   Handedness: Right handed   Onset: 08/24/2023  Seizure Type: Loss of consciousness   Current frequency: July 10  Any injuries from seizures: Denies   Seizure risk factors: Vascular dementia   Previous ASMs: None   Currenty ASMs: Levetiracetam    ASMs side effects: Somnolence   Brain Images: No acute findings   Previous EEGs: not previously done    OTHER MEDICAL CONDITIONS: Dementia, Hypertension, Hyperlipidemia, anxiety   REVIEW OF SYSTEMS: Full 14 system review of systems performed and negative with exception of: As noted in the HPI   ALLERGIES: Allergies  Allergen Reactions   Nickel Itching, Rash and Swelling    Possibility...still trying to figure out   Statins Other (See Comments)    Atorvastatin , pravastatin , simvastatin  Myalgias    Bacitracin Rash   Gold Sodium Thiosulfate Rash   Palladium Chloride Other (See Comments)    Positive patch test     HOME MEDICATIONS: Outpatient Medications Prior to Visit  Medication Sig Dispense Refill   acetaminophen  (TYLENOL ) 500 MG tablet Take 1,000 mg by mouth every 6 (six) hours as needed for moderate pain or headache.     ALPRAZolam  (XANAX ) 0.25 MG  tablet Take 0.0625-0.25 mg by mouth 2 (two) times daily as needed for anxiety.      amLODipine  (NORVASC ) 5 MG tablet Take 5 mg by mouth daily.     aspirin  EC 81 MG tablet Take 81 mg by mouth daily. Swallow whole.     chlorthalidone  (HYGROTON ) 25 MG tablet Take 25 mg by mouth daily.     citalopram  (CELEXA ) 10 MG tablet Take 10 mg by mouth daily.     Colchicine 0.6 MG CAPS Take 1 capsule by mouth daily as needed  (gout).     cyanocobalamin (VITAMIN B12) 1000 MCG/ML injection Inject 1,000 mcg into the muscle every 30 (thirty) days.     donepezil (ARICEPT) 10 MG tablet Take 10 mg by mouth at bedtime.     metoprolol  succinate (TOPROL -XL) 25 MG 24 hr tablet Take 0.5 tablets (12.5 mg total) by mouth daily. 30 tablet 0   pravastatin  (PRAVACHOL ) 40 MG tablet Take 40 mg by mouth at bedtime.     levETIRAcetam  (KEPPRA ) 500 MG tablet Take 1 tablet (500 mg total) by mouth 2 (two) times daily. 60 tablet 2   Facility-Administered Medications Prior to Visit  Medication Dose Route Frequency Provider Last Rate Last Admin   0.9 %  sodium chloride  infusion   Intravenous Continuous Jule Ronal CROME, PA-C       acetaminophen  (TYLENOL ) tablet 650 mg  650 mg Oral Q6H PRN Jule Ronal CROME, PA-C       Or   acetaminophen  (TYLENOL ) suppository 650 mg  650 mg Rectal Q6H PRN Jule Ronal CROME, PA-C        PAST MEDICAL HISTORY: Past Medical History:  Diagnosis Date   Anemia, normocytic normochromic 01/2012   hx of   Anxiety    takes Xanax  daily as needed   Arthritis    knees, hands   Cataract    right eye   Depression    takes Citalopram  daily   Dizziness    cardiologist is aware and told pt it was related to meds   History of bronchitis    2014   History of colon polyps    benign   History of migraine    Hyperlipidemia    takes Pravastatin  daily   Hypertension    takes Metoprolol  and Azor  daily   Joint pain    Pneumonia    hx of-2015   Primary localized osteoarthritis of right knee 10/01/2019   Pseudogout of knee, right 02/22/2017   Renal insufficiency 02/22/2017   Rheumatic fever    hx of   Wheezing on expiration     PAST SURGICAL HISTORY: Past Surgical History:  Procedure Laterality Date   ABDOMINAL HYSTERECTOMY     APPENDECTOMY     CARDIAC CATHETERIZATION     early 2000's   CARDIAC CATHETERIZATION N/A 03/04/2015   Procedure: Left Heart Cath and Coronary Angiography;  Surgeon: Ozell Fell, MD;   Location: [facility] INVASIVE CV LAB;  Service: Cardiovascular;  Laterality: N/A;   CATARACT EXTRACTION Bilateral    cataract surgery Left    CHOLECYSTECTOMY     CHONDROPLASTY Right 02/21/2017   Procedure: CHONDROPLASTY WITH DEBRIDEMENT;  Surgeon: Jane Charleston, MD;  Location: [facility];  Service: Orthopedics;  Laterality: Right;   COLONOSCOPY  2005   Negative screening study   COLONOSCOPY N/A 04/04/2014   Procedure: COLONOSCOPY;  Surgeon: Claudis RAYMOND Rivet, MD;  Location: [facility];  Service: Endoscopy;  Laterality: N/A;  1225   EXPLORATORY LAPAROTOMY  x 3   EYE SURGERY     IR GENERIC HISTORICAL  10/15/2015   IR RADIOLOGIST EVAL & MGMT 10/15/2015 [facility]   IR GENERIC HISTORICAL  10/20/2015   IR KYPHO LUMBAR INC FX REDUCE BONE BX UNI/BIL CANNULATION INC/IMAGING 10/20/2015 Thyra Nash, MD [facility]   IRRIGATION AND DEBRIDEMENT KNEE Right 02/21/2017   Procedure: IRRIGATION AND DEBRIDEMENT KNEE;  Surgeon: Jane Charleston, MD;  Location: [facility];  Service: Orthopedics;  Laterality: Right;   LUMBAR LAMINECTOMY/DECOMPRESSION MICRODISCECTOMY Left 01/24/2018   Procedure: LEFT LUMBAR FOUR- LUMBAR FIVE  LAMINOTOMY AND MICRODISCECTOMY;  Surgeon: Alix Charleston, MD;  Location: [facility] OR;  Service: Neurosurgery;  Laterality: Left;  LEFT LUMBAR 4- LUMBAR 5  LAMINOTOMY AND MICRODISCECTOMY   TOTAL HIP ARTHROPLASTY Right 12/16/2014   Procedure: TOTAL HIP ARTHROPLASTY ANTERIOR APPROACH;  Surgeon: Evalene JONETTA Chancy, MD;  Location: [facility] OR;  Service: Orthopedics;  Laterality: Right;   TOTAL KNEE ARTHROPLASTY Right 10/11/2019   Procedure: TOTAL KNEE ARTHROPLASTY;  Surgeon: Jane Charleston, MD;  Location: [facility];  Service: Orthopedics;  Laterality: Right;    FAMILY HISTORY: Family History  Problem Relation Age of Onset   Heart attack Mother 90       heart problems   Cancer Mother        uterus   Pancreatitis Sister        died  age 34   Colon cancer Maternal Aunt 71   Colon  cancer Maternal Aunt 78   Colon cancer Maternal Aunt 25   Cancer Other        FH   Diabetes Other        FH   Hypertension Other        FH   Diabetes Son    Diabetes Daughter    Colon cancer Maternal Uncle 50    SOCIAL HISTORY: Social History   Socioeconomic History   Marital status: Widowed    Spouse name: Not on file   Number of children: Not on file   Years of education: Not on file   Highest education level: Not on file  Occupational History   Not on file  Tobacco Use   Smoking status: Former    Current packs/day: 1.00    Average packs/day: 1 pack/day for 15.0 years (15.0 ttl pk-yrs)    Types: Cigarettes   Smokeless tobacco: Never   Tobacco comments:    quit smoking 30+yrs ago  Vaping Use   Vaping status: Never Used  Substance and Sexual Activity   Alcohol use: Yes    Alcohol/week: 3.0 standard drinks of alcohol    Types: 3 Glasses of wine per week    Comment: socially wine   Drug use: No   Sexual activity: Not on file  Other Topics Concern   Not on file  Social History Narrative   Not on file   Social Drivers of Health   Financial Resource Strain: Not on file  Food Insecurity: Not on file  Transportation Needs: Not on file  Physical Activity: Not on file  Stress: Not on file  Social Connections: Not on file  Intimate Partner Violence: Not on file    PHYSICAL EXAM  GENERAL EXAM/CONSTITUTIONAL: Vitals:  Vitals:   08/31/23 1418  BP: 122/64  Pulse: (!) 53  SpO2: 96%  Weight: 97 lb 8 oz (44.2 kg)   Body mass index is 19.04 kg/m. Wt Readings from Last 3 Encounters:  08/31/23 97 lb 8 oz (44.2 kg)  08/14/22  110 lb (49.9 kg)  05/26/22 106 lb (48.1 kg)   Patient is in no distress; well developed, nourished and groomed; neck is supple  MUSCULOSKELETAL: Gait, strength, tone, movements noted in Neurologic exam below  NEUROLOGIC: MENTAL STATUS:      No data to display         awake, alert, not oriented to place and time Difficulty with  recent memory  Difficulty with concentration  language fluent, comprehension intact, naming intact  CRANIAL NERVE:  2nd, 3rd, 4th, 6th - Visual fields full to confrontation, extraocular muscles intact, no nystagmus 5th - facial sensation symmetric 7th - facial strength symmetric 8th - hearing intact 9th - palate elevates symmetrically, uvula midline 11th - shoulder shrug symmetric 12th - tongue protrusion midline  MOTOR:  normal bulk and tone, full strength in the BUE, BLE  SENSORY:  normal and symmetric to light touch  COORDINATION:  finger-nose-finger, fine finger movements normal  GAIT/STATION:  Walks with a cane    DIAGNOSTIC DATA (LABS, IMAGING, TESTING) - I reviewed patient records, labs, notes, testing and imaging myself where available.  Lab Results  Component Value Date   WBC 8.8 08/24/2023   HGB 12.1 08/24/2023   HCT 36.6 08/24/2023   MCV 96.1 08/24/2023   PLT 354 08/24/2023      Component Value Date/Time   NA 139 08/24/2023 1450   K 3.9 08/24/2023 1450   CL 101 08/24/2023 1450   CO2 25 08/24/2023 1450   GLUCOSE 135 (H) 08/24/2023 1450   BUN 28 (H) 08/24/2023 1450   CREATININE 0.86 08/24/2023 1450   CALCIUM  9.4 08/24/2023 1450   PROT 7.6 08/24/2023 1450   ALBUMIN  3.7 08/24/2023 1450   AST 18 08/24/2023 1450   ALT 12 08/24/2023 1450   ALKPHOS 68 08/24/2023 1450   BILITOT 0.4 08/24/2023 1450   GFRNONAA >60 08/24/2023 1450   GFRAA >60 10/12/2019 0356   Lab Results  Component Value Date   CHOL 270 (H) 01/31/2012   HDL 56 01/31/2012   LDLCALC 184 (H) 01/31/2012   TRIG 152 (H) 01/31/2012   Lab Results  Component Value Date   HGBA1C 5.8 (H) 01/31/2012   Lab Results  Component Value Date   VITAMINB12 554 01/31/2012   Lab Results  Component Value Date   TSH 2.199 01/31/2012    Head CT 08/24/2023 1. No CT evidence for acute intracranial abnormality. 2. Atrophy and chronic small vessel ischemic changes of the white matter  I personally  reviewed brain Images and previous EEG reports.   ASSESSMENT AND PLAN  83 y.o. year old female  with      Seizure like activity  Recent onset characterized by loss of consciousness, labored breathing, and confusion. Initial episode on July 10, with a subsequent smaller episode at the hospital. Currently on levetiracetam  500 mg twice daily, which may be causing increased sleepiness. - Reduce levetiracetam  to 250 mg twice daily to minimize somnolence and slowness of thought process, especially in the elderly. - Order EEG to be conducted in the office. - Monitor for further seizure activity, including episodes of staring or unresponsiveness.  Vascular dementia Significant memory impairment, including disorientation to time and place, and occasional failure to recognize familiar people. Symptoms include confabulation and misidentification of her home. - Hold Aricept and assess for diarrhea, might restart it or switch to Rivastigmine  - Monitor cognitive function and memory changes.  Diarrhea possibly due to Aricept Chronic diarrhea, potentially exacerbated by Aricept. Diarrhea was present prior  to starting Aricept, but medication may worsen symptoms. - Hold Aricept for two weeks to assess impact on diarrhea.        1. Seizures (HCC)   2. 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  Hold Aricept for 2 to 3 weeks and monitor for diarrhea, might consider different anticholinesterase inhibitor such as rivastigmine Continue your other medications Continue follow with PCP Return in 6 months or sooner if worse   Per [place]  DMV statutes, patients with seizures are not allowed to drive until they have been seizure-free for six months.  Other recommendations include using caution when using heavy equipment or  power tools. Avoid working on ladders or at heights. Take showers instead of baths.  Do not swim alone.  Ensure the water  temperature is not too high on the home water  heater. Do not go swimming alone. Do not lock yourself in a room alone (i.e. bathroom). When caring for infants or small children, sit down when holding, feeding, or changing them to minimize risk of injury to the child in the event you have a seizure. Maintain good sleep hygiene. Avoid alcohol.  Also recommend adequate sleep, hydration, good diet and minimize stress.   During the Seizure  - First, ensure adequate ventilation and place patients on the floor on their left side  Loosen clothing around the neck and ensure the airway is patent. If the patient is clenching the teeth, do not force the mouth open with any object as this can cause severe damage - Remove all items from the surrounding that can be hazardous. The patient may be oblivious to what's happening and may not even know what he or she is doing. If the patient is confused and wandering, either gently guide him/her away and block access to outside areas - Reassure the individual and be comforting - Call 911. In most cases, the seizure ends before EMS arrives. However, there are cases when seizures may last over 3 to 5 minutes. Or the individual may have developed breathing difficulties or severe injuries. If a pregnant patient or a person with diabetes develops a seizure, it is prudent to call an ambulance. - Finally, if the patient does not regain full consciousness, then call EMS. Most patients will remain confused for about 45 to 90 minutes after a seizure, so you must use judgment in calling for help. - Avoid restraints but make sure the patient is in a bed with padded side rails - Place the individual in a lateral position with the neck slightly flexed; this will help the saliva drain from the mouth and prevent the tongue from falling backward - Remove all nearby  furniture and other hazards from the area - Provide verbal assurance as the individual is regaining consciousness - Provide the patient with privacy if possible - Call for help and start treatment as ordered by the caregiver   After the Seizure (Postictal Stage)  After a seizure, most patients experience confusion, fatigue, muscle pain and/or a headache. Thus, one should permit the individual to sleep. For the next few days, reassurance is essential. Being calm and helping reorient the person is also of importance.  Most seizures are painless and end spontaneously. Seizures are not harmful to others but can lead to complications such as stress on the lungs, brain and the  heart. Individuals with prior lung problems may develop labored breathing and respiratory distress.    Discussed Patients with epilepsy have a small risk of sudden unexpected death, a condition referred to as sudden unexpected death in epilepsy (SUDEP). SUDEP is defined specifically as the sudden, unexpected, witnessed or unwitnessed, nontraumatic and nondrowning death in patients with epilepsy with or without evidence for a seizure, and excluding documented status epilepticus, in which post mortem examination does not reveal a structural or toxicologic cause for death     Orders Placed This Encounter  Procedures   EEG adult    Meds ordered this encounter  Medications   levETIRAcetam  (KEPPRA ) 250 MG tablet    Sig: Take 1 tablet (250 mg total) by mouth 2 (two) times daily.    Dispense:  180 tablet    Refill:  3    Return in about 6 months (around 03/02/2024).    Pastor Falling, MD 08/31/2023, 4:48 PM  [facility] [address], [address] [name], [place] [address] [phone]

## 2023-09-04 ENCOUNTER — Ambulatory Visit (INDEPENDENT_AMBULATORY_CARE_PROVIDER_SITE_OTHER): Admitting: Neurology

## 2023-09-04 ENCOUNTER — Ambulatory Visit: Payer: Self-pay | Admitting: Neurology

## 2023-09-04 DIAGNOSIS — R569 Unspecified convulsions: Secondary | ICD-10-CM | POA: Diagnosis not present

## 2023-09-04 NOTE — Procedures (Signed)
    History:  83 year old woman with seizure  EEG classification: Awake and drowsy  Duration: 28 minutes   Technical aspects: This EEG study was done with scalp electrodes positioned according to the 10-20 International system of electrode placement. Electrical activity was reviewed with band pass filter of 1-70Hz , sensitivity of 7 uV/mm, display speed of 69mm/sec with a 60Hz  notched filter applied as appropriate. EEG data were recorded continuously and digitally stored.   Description of the recording: The background rhythms of this recording consists of a fairly well modulated medium amplitude delta- theta activity. Photic stimulation was performed, did not show any abnormalities. Hyperventilation was not performed, did not show any abnormalities. No abnormal epileptiform discharges seen during this recording. There was no focal slowing. There were no electrographic seizure identified.   Abnormality: Mild to moderate diffuse slowing    Impression: This is an abnormal awake EEG due to mild to moderate slowing. This is consistent with a generalized brain dysfunction, nonspecific etiology.    Mackenzie Radich, MD [facility]

## 2023-09-04 NOTE — Progress Notes (Signed)
 Please call and inform patient that the recent EEG showed diffuse slowing. This is a nonspecific finding and can be seen in people with dementia or seizure disorder. No further action is required on this test at this time. Please continue with Keppra , keep any upcoming appointments or tests and  call us  with any interim questions, concerns, problems or updates. Thanks,   Pastor Falling, MD

## 2023-09-05 ENCOUNTER — Telehealth: Payer: Self-pay

## 2023-09-05 NOTE — Telephone Encounter (Signed)
-----   Message from Pastor Falling sent at 09/04/2023 10:22 PM EDT ----- Please call and inform patient that the recent EEG showed diffuse slowing. This is a nonspecific finding and can be seen in people with dementia or seizure disorder. No further action is required on  this test at this time. Please continue with Keppra , keep any upcoming appointments or tests and  call us  with any interim questions, concerns, problems or updates. Thanks,   Pastor Falling, MD  ----- Message ----- From: Camara, Amadou, MD Sent: 09/04/2023  10:31 AM EDT To: Pastor Falling, MD

## 2023-09-05 NOTE — Progress Notes (Signed)
 Complex Care Management Note Care Guide Note  09/05/2023 Name: Mackenzie [name] MRN: [id number] DOB: 1940/07/17   Complex Care Management Outreach Attempts: An unsuccessful telephone outreach was attempted today to offer the patient information about available complex care management services.  Follow Up Plan:  Additional outreach attempts will be made to offer the patient complex care management information and services.   Encounter Outcome:  No Answer  Leotis Rase [facility], [facility] Guide  Direct Dial: [phone]  Fax [phone]

## 2023-09-05 NOTE — Telephone Encounter (Signed)
 Called and spoke to pts son and relayed result and recommendations. Pt son verbalized understanding and gratitude of all discussed

## 2023-09-06 NOTE — Progress Notes (Signed)
 Complex Care Management Note Care Guide Note  09/06/2023 Name: Mackenzie [name] MRN: [id number] DOB: [birth year]   Complex Care Management Outreach Attempts: A second unsuccessful outreach was attempted today to offer the patient with information about available complex care management services.  Follow Up Plan:  Additional outreach attempts will be made to offer the patient complex care management information and services.   Encounter Outcome:  No Answer  Leotis Rase [facility], [facility] Guide  Direct Dial: [phone]  Fax [phone]

## 2023-09-07 NOTE — Progress Notes (Signed)
 Complex Care Management Note Care Guide Note  09/07/2023 Name: Mackenzie [name] MRN: [id number] DOB: 1940/07/30   Complex Care Management Outreach Attempts: A third unsuccessful outreach was attempted today to offer the patient with information about available complex care management services.  Follow Up Plan:  No further outreach attempts will be made at this time. We have been unable to contact the patient to offer or enroll patient in complex care management services.  Encounter Outcome:  No Answer  Leotis Rase [facility], [facility] Guide  Direct Dial: [phone]  Fax [phone]

## 2023-10-18 ENCOUNTER — Ambulatory Visit
Admission: EM | Admit: 2023-10-18 | Discharge: 2023-10-18 | Disposition: A | Discharge status: Home / Self Care | Attending: Nurse Practitioner | Admitting: Nurse Practitioner

## 2023-10-18 DIAGNOSIS — Z8739 Personal history of other diseases of the musculoskeletal system and connective tissue: Secondary | ICD-10-CM | POA: Diagnosis not present

## 2023-10-18 DIAGNOSIS — M79675 Pain in left toe(s): Secondary | ICD-10-CM | POA: Diagnosis not present

## 2023-10-18 MED ORDER — DEXAMETHASONE SODIUM PHOSPHATE 10 MG/ML IJ SOLN
10.0000 mg | INTRAMUSCULAR | Status: AC
  Administered 2023-10-18: 10 mg via INTRAMUSCULAR

## 2023-10-18 MED ORDER — PREDNISONE 20 MG PO TABS
40.0000 mg | ORAL_TABLET | Freq: Every day | ORAL | 0 refills | Status: AC
Start: 2023-10-18 — End: 2023-10-23

## 2023-10-18 NOTE — ED Triage Notes (Signed)
 Pt reports pain in the great toe on the left foot x 1 week. Has not tried otc meds. Has hx of gout .

## 2023-10-18 NOTE — Discharge Instructions (Addendum)
 Uric acid level is pending.  You will be contacted if the pending test results are abnormal.  You will also have access to the results via MyChart. You have been given an injection of Decadron  10 mg.  Start the prednisone  tomorrow. Take medication as prescribed. You may take over-the-counter Tylenol  as needed for pain or discomfort. Recommend the use of ice to help with pain or swelling.  Apply for 20 minutes, remove for 1 hour, repeat as needed. Dietary modifications to help control gout flares. If symptoms fail to improve, please follow-up with your primary care physician for further evaluation. Follow-up as needed.

## 2023-10-18 NOTE — ED Provider Notes (Signed)
 [facility]    CSN: [id number] Arrival date & time: 10/18/23  1135      History   Chief Complaint No chief complaint on file.   HPI Mackenzie [name] is a 83 y.o. female.   The history is provided by the patient and the spouse.   Patient presents with a 1 to 2-week history of pain in the left great toe.  Patient denies injury, trauma, numbness, tingling, or radiation of pain.  Patient reports prior history of gout, states that she does not take any medications for gout regularly.  So far, she has not tried any medications for her symptoms.  Past Medical History:  Diagnosis Date   Anemia, normocytic normochromic 01/2012   hx of   Anxiety    takes Xanax  daily as needed   Arthritis    knees, hands   Cataract    right eye   Depression    takes Citalopram  daily   Dizziness    cardiologist is aware and told pt it was related to meds   History of bronchitis    2014   History of colon polyps    benign   History of migraine    Hyperlipidemia    takes Pravastatin  daily   Hypertension    takes Metoprolol  and Azor  daily   Joint pain    Pneumonia    hx of-2015   Primary localized osteoarthritis of right knee 10/01/2019   Pseudogout of knee, right 02/22/2017   Renal insufficiency 02/22/2017   Rheumatic fever    hx of   Wheezing on expiration     Patient Active Problem List   Diagnosis Date Noted   H/O vitrectomy 01/16/2020   Macular pucker, left eye 12/24/2019   Intermediate stage nonexudative age-related macular degeneration of both eyes 12/24/2019   Posterior vitreous detachment of right eye 12/24/2019   Pseudophakia of both eyes 12/24/2019   Primary localized osteoarthritis of right knee 10/01/2019   Lumbar disc herniation 01/24/2018   Constipation 03/09/2017   Pseudogout of knee, right 02/22/2017   Renal insufficiency 02/22/2017   Septic joint of right knee joint (HCC) 02/21/2017   Infection of right knee (HCC) 02/06/2017   Septic arthritis (HCC) 02/06/2017    Chest pain 03/04/2015   Essential hypertension    Dyslipidemia    Primary osteoarthritis of right hip 12/16/2014   Postoperative anemia due to acute blood loss 12/16/2014   Bilateral carotid artery disease (HCC) 11/05/2014   Multiple fractures of ribs of right side 06/18/2012   Pneumothorax on right 06/18/2012   Unstable angina (HCC) 01/31/2012   Cerebrovascular disease 01/31/2012   Arteriosclerotic cardiovascular disease (ASCVD)    Hyperlipidemia    Hypertension    Anemia, normocytic normochromic 01/15/2012   Mitral valve disease 12/24/2008    Past Surgical History:  Procedure Laterality Date   ABDOMINAL HYSTERECTOMY     APPENDECTOMY     CARDIAC CATHETERIZATION     early 2000's   CARDIAC CATHETERIZATION N/A 03/04/2015   Procedure: Left Heart Cath and Coronary Angiography;  Surgeon: Ozell Fell, MD;  Location: [facility];  Service: Cardiovascular;  Laterality: N/A;   CATARACT EXTRACTION Bilateral    cataract surgery Left    CHOLECYSTECTOMY     CHONDROPLASTY Right 02/21/2017   Procedure: CHONDROPLASTY WITH DEBRIDEMENT;  Surgeon: Jane Charleston, MD;  Location: [facility];  Service: Orthopedics;  Laterality: Right;   COLONOSCOPY  2005   Negative screening study   COLONOSCOPY N/A 04/04/2014  Procedure: COLONOSCOPY;  Surgeon: Claudis RAYMOND Rivet, MD;  Location: [facility];  Service: Endoscopy;  Laterality: N/A;  1225   EXPLORATORY LAPAROTOMY     x 3   EYE SURGERY     IR GENERIC HISTORICAL  10/15/2015   IR RADIOLOGIST EVAL & MGMT 10/15/2015 [facility]   IR GENERIC HISTORICAL  10/20/2015   IR KYPHO LUMBAR INC FX REDUCE BONE BX UNI/BIL CANNULATION INC/IMAGING 10/20/2015 Thyra Nash, MD [facility]   IRRIGATION AND DEBRIDEMENT KNEE Right 02/21/2017   Procedure: IRRIGATION AND DEBRIDEMENT KNEE;  Surgeon: Jane Charleston, MD;  Location: [facility];  Service: Orthopedics;  Laterality: Right;   LUMBAR LAMINECTOMY/DECOMPRESSION MICRODISCECTOMY  Left 01/24/2018   Procedure: LEFT LUMBAR FOUR- LUMBAR FIVE  LAMINOTOMY AND MICRODISCECTOMY;  Surgeon: Alix Charleston, MD;  Location: [facility];  Service: Neurosurgery;  Laterality: Left;  LEFT LUMBAR 4- LUMBAR 5  LAMINOTOMY AND MICRODISCECTOMY   TOTAL HIP ARTHROPLASTY Right 12/16/2014   Procedure: TOTAL HIP ARTHROPLASTY ANTERIOR APPROACH;  Surgeon: Evalene JONETTA Chancy, MD;  Location: [facility];  Service: Orthopedics;  Laterality: Right;   TOTAL KNEE ARTHROPLASTY Right 10/11/2019   Procedure: TOTAL KNEE ARTHROPLASTY;  Surgeon: Jane Charleston, MD;  Location: [facility];  Service: Orthopedics;  Laterality: Right;    OB History   No obstetric history on file.      Home Medications    Prior to Admission medications   Medication Sig Start Date End Date Taking? Authorizing Provider  acetaminophen  (TYLENOL ) 500 MG tablet Take 1,000 mg by mouth every 6 (six) hours as needed for moderate pain or headache.    [provider]  ALPRAZolam  (XANAX ) 0.25 MG tablet Take 0.0625-0.25 mg by mouth 2 (two) times daily as needed for anxiety.  07/26/19   [provider]  amLODipine  (NORVASC ) 5 MG tablet Take 5 mg by mouth daily. 07/13/19   [provider]  aspirin  EC 81 MG tablet Take 81 mg by mouth daily. Swallow whole.    [provider]  chlorthalidone  (HYGROTON ) 25 MG tablet Take 25 mg by mouth daily.    [provider]  citalopram  (CELEXA ) 10 MG tablet Take 10 mg by mouth daily.    [provider]  Colchicine 0.6 MG CAPS Take 1 capsule by mouth daily as needed (gout). 01/23/19   [provider]  cyanocobalamin (VITAMIN B12) 1000 MCG/ML injection Inject 1,000 mcg into the muscle every 30 (thirty) days. 03/14/23   [provider]  donepezil (ARICEPT) 10 MG tablet Take 10 mg by mouth at bedtime. 06/07/22   [provider]  levETIRAcetam  (KEPPRA ) 250 MG tablet Take 1 tablet (250 mg total) by mouth 2 (two) times daily. 08/31/23   Camara, Amadou, MD   metoprolol  succinate (TOPROL -XL) 25 MG 24 hr tablet Take 0.5 tablets (12.5 mg total) by mouth daily. 05/26/22   Yolande Charleston BROCKS, MD  pravastatin  (PRAVACHOL ) 40 MG tablet Take 40 mg by mouth at bedtime.    [provider]    Family History Family History  Problem Relation Age of Onset   Heart attack Mother 27       heart problems   Cancer Mother        uterus   Pancreatitis Sister        died  age 52   Colon cancer Maternal Aunt 51   Colon cancer Maternal Aunt 78   Colon cancer Maternal Aunt 67   Cancer Other        FH  Diabetes Other        FH   Hypertension Other        FH   Diabetes Son    Diabetes Daughter    Colon cancer Maternal Uncle 25    Social History Social History   Tobacco Use   Smoking status: Former    Current packs/day: 1.00    Average packs/day: 1 pack/day for 15.0 years (15.0 ttl pk-yrs)    Types: Cigarettes   Smokeless tobacco: Never   Tobacco comments:    quit smoking 30+yrs ago  Vaping Use   Vaping status: Never Used  Substance Use Topics   Alcohol use: Yes    Alcohol/week: 3.0 standard drinks of alcohol    Types: 3 Glasses of wine per week    Comment: socially wine   Drug use: No     Allergies   Nickel, Statins, Bacitracin, Gold sodium thiosulfate, and Palladium chloride   Review of Systems Review of Systems Per HPI  Physical Exam Triage Vital Signs ED Triage Vitals  Encounter Vitals Group     BP 10/18/23 1232 (!) 152/74     Girls Systolic BP Percentile --      Girls Diastolic BP Percentile --      Boys Systolic BP Percentile --      Boys Diastolic BP Percentile --      Pulse Rate 10/18/23 1232 (!) 56     Resp 10/18/23 1232 18     Temp 10/18/23 1232 98.6 F (37 C)     Temp Source 10/18/23 1232 Oral     SpO2 10/18/23 1232 98 %     Weight --      Height --      Head Circumference --      Peak Flow --      Pain Score 10/18/23 1236 0     Pain Loc --      Pain Education --      Exclude from Growth Chart --     No data found.  Updated Vital Signs BP (!) 152/74 (BP Location: Right Arm)   Pulse (!) 56   Temp 98.6 F (37 C) (Oral)   Resp 18   SpO2 98%   Visual Acuity Right Eye Distance:   Left Eye Distance:   Bilateral Distance:    Right Eye Near:   Left Eye Near:    Bilateral Near:     Physical Exam Vitals and nursing note reviewed.  Constitutional:      General: She is not in acute distress.    Appearance: Normal appearance.  HENT:     Head: Normocephalic.  Eyes:     Extraocular Movements: Extraocular movements intact.     Pupils: Pupils are equal, round, and reactive to light.  Cardiovascular:     Rate and Rhythm: Normal rate and regular rhythm.     Pulses: Normal pulses.     Heart sounds: Normal heart sounds.  Pulmonary:     Effort: Pulmonary effort is normal.     Breath sounds: Normal breath sounds.  Musculoskeletal:     Cervical back: Normal range of motion.     Left foot: Normal capillary refill. Swelling (left first metatarsal) and tenderness (left first metatarsal) present. No deformity. Normal pulse.     Comments: Erythema present to the 1st metatarsal of the left foot.   Skin:    General: Skin is warm and dry.  Neurological:     General: No focal deficit present.  Mental Status: She is alert and oriented to person, place, and time.  Psychiatric:        Mood and Affect: Mood normal.        Behavior: Behavior normal.      UC Treatments / Results  Labs (all labs ordered are listed, but only abnormal results are displayed) Labs Reviewed  URIC ACID    EKG   Radiology No results found.  Procedures Procedures (including critical care time)  Medications Ordered in UC Medications  dexamethasone  (DECADRON ) injection 10 mg (has no administration in time range)    Initial Impression / Assessment and Plan / UC Course  I have reviewed the triage vital signs and the nursing notes.  Pertinent labs & imaging results that were available during my care  of the patient were reviewed by me and considered in my medical decision making (see chart for details).  Will defer imaging at this time as patient has not experienced any injury or trauma.  Uric acid level is pending.  Decadron  10 mg IM administered for inflammation for treatment of possible gout and left great toe pain and swelling.  Uric acid level is pending.  Will start patient on prednisone  40 mg for the next 5 days.  Supportive care recommendations were provided discussed with the patient to include over-the-counter analgesics such as Tylenol , dietary modifications, and the use of ice as needed.  Discussed indications with patient regarding follow-up.  Also advised patient that it is recommended that she discuss possible medications to control her symptoms with her PCP.  Patient was in agreement with this plan of care and verbalizes understanding.  All questions were answered.  Patient stable for discharge.  Final Clinical Impressions(s) / UC Diagnoses   Final diagnoses:  Great toe pain, left  History of gout     Discharge Instructions      Uric acid level is pending.  You will be contacted if the pending test results are abnormal.  You will also have access to the results via MyChart. You have been given an injection of Decadron  10 mg.  Start the prednisone  tomorrow. Take medication as prescribed. You may take over-the-counter Tylenol  as needed for pain or discomfort. Recommend the use of ice to help with pain or swelling.  Apply for 20 minutes, remove for 1 hour, repeat as needed. Dietary modifications to help control gout flares. If symptoms fail to improve, please follow-up with your primary care physician for further evaluation. Follow-up as needed.     ED Prescriptions   None    PDMP not reviewed this encounter.   Gilmer Etta PARAS, NP 10/18/23 1308

## 2023-10-19 ENCOUNTER — Ambulatory Visit (HOSPITAL_COMMUNITY): Payer: Self-pay

## 2023-10-19 LAB — URIC ACID: Uric Acid: 8.1 mg/dL — ABNORMAL HIGH (ref 3.1–7.9)

## 2023-11-28 DIAGNOSIS — R531 Weakness: Secondary | ICD-10-CM | POA: Diagnosis not present

## 2023-12-04 ENCOUNTER — Encounter (HOSPITAL_COMMUNITY): Payer: Self-pay | Admitting: Emergency Medicine

## 2023-12-04 ENCOUNTER — Other Ambulatory Visit: Payer: Self-pay

## 2023-12-04 ENCOUNTER — Emergency Department (HOSPITAL_COMMUNITY)
Admission: EM | Admit: 2023-12-04 | Discharge: 2023-12-13 | Disposition: A | Discharge status: Home / Self Care | Attending: Emergency Medicine | Admitting: Emergency Medicine

## 2023-12-04 DIAGNOSIS — Z96641 Presence of right artificial hip joint: Secondary | ICD-10-CM | POA: Diagnosis not present

## 2023-12-04 DIAGNOSIS — A419 Sepsis, unspecified organism: Secondary | ICD-10-CM

## 2023-12-04 DIAGNOSIS — F03918 Unspecified dementia, unspecified severity, with other behavioral disturbance: Secondary | ICD-10-CM

## 2023-12-04 DIAGNOSIS — K838 Other specified diseases of biliary tract: Secondary | ICD-10-CM | POA: Insufficient documentation

## 2023-12-04 DIAGNOSIS — I6782 Cerebral ischemia: Secondary | ICD-10-CM | POA: Insufficient documentation

## 2023-12-04 DIAGNOSIS — N281 Cyst of kidney, acquired: Secondary | ICD-10-CM | POA: Insufficient documentation

## 2023-12-04 DIAGNOSIS — R410 Disorientation, unspecified: Secondary | ICD-10-CM | POA: Insufficient documentation

## 2023-12-04 DIAGNOSIS — M542 Cervicalgia: Secondary | ICD-10-CM | POA: Diagnosis not present

## 2023-12-04 DIAGNOSIS — R45851 Suicidal ideations: Secondary | ICD-10-CM | POA: Diagnosis not present

## 2023-12-04 DIAGNOSIS — F0150 Vascular dementia without behavioral disturbance: Secondary | ICD-10-CM | POA: Diagnosis not present

## 2023-12-04 DIAGNOSIS — I10 Essential (primary) hypertension: Secondary | ICD-10-CM | POA: Diagnosis not present

## 2023-12-04 DIAGNOSIS — R5383 Other fatigue: Secondary | ICD-10-CM

## 2023-12-04 DIAGNOSIS — R2689 Other abnormalities of gait and mobility: Secondary | ICD-10-CM | POA: Insufficient documentation

## 2023-12-04 DIAGNOSIS — M6281 Muscle weakness (generalized): Secondary | ICD-10-CM | POA: Diagnosis not present

## 2023-12-04 DIAGNOSIS — R2681 Unsteadiness on feet: Secondary | ICD-10-CM | POA: Insufficient documentation

## 2023-12-04 DIAGNOSIS — Z79899 Other long term (current) drug therapy: Secondary | ICD-10-CM | POA: Diagnosis not present

## 2023-12-04 DIAGNOSIS — Z96651 Presence of right artificial knee joint: Secondary | ICD-10-CM | POA: Diagnosis not present

## 2023-12-04 DIAGNOSIS — G319 Degenerative disease of nervous system, unspecified: Secondary | ICD-10-CM | POA: Diagnosis not present

## 2023-12-04 DIAGNOSIS — F411 Generalized anxiety disorder: Secondary | ICD-10-CM | POA: Diagnosis not present

## 2023-12-04 DIAGNOSIS — Z9181 History of falling: Secondary | ICD-10-CM | POA: Diagnosis not present

## 2023-12-04 DIAGNOSIS — F419 Anxiety disorder, unspecified: Secondary | ICD-10-CM | POA: Insufficient documentation

## 2023-12-04 DIAGNOSIS — Z7982 Long term (current) use of aspirin: Secondary | ICD-10-CM | POA: Insufficient documentation

## 2023-12-04 DIAGNOSIS — M4856XA Collapsed vertebra, not elsewhere classified, lumbar region, initial encounter for fracture: Secondary | ICD-10-CM | POA: Insufficient documentation

## 2023-12-04 DIAGNOSIS — Z87891 Personal history of nicotine dependence: Secondary | ICD-10-CM | POA: Insufficient documentation

## 2023-12-04 DIAGNOSIS — I700 Atherosclerosis of aorta: Secondary | ICD-10-CM | POA: Insufficient documentation

## 2023-12-04 DIAGNOSIS — R531 Weakness: Secondary | ICD-10-CM | POA: Diagnosis not present

## 2023-12-04 DIAGNOSIS — R911 Solitary pulmonary nodule: Secondary | ICD-10-CM | POA: Diagnosis not present

## 2023-12-04 DIAGNOSIS — I3139 Other pericardial effusion (noninflammatory): Secondary | ICD-10-CM | POA: Insufficient documentation

## 2023-12-04 DIAGNOSIS — Z743 Need for continuous supervision: Secondary | ICD-10-CM | POA: Diagnosis not present

## 2023-12-04 LAB — COMPREHENSIVE METABOLIC PANEL WITH GFR
ALT: 7 U/L (ref 0–44)
AST: 18 U/L (ref 15–41)
Albumin: 4.3 g/dL (ref 3.5–5.0)
Alkaline Phosphatase: 86 U/L (ref 38–126)
Anion gap: 11 (ref 5–15)
BUN: 18 mg/dL (ref 8–23)
CO2: 29 mmol/L (ref 22–32)
Calcium: 9.5 mg/dL (ref 8.9–10.3)
Chloride: 101 mmol/L (ref 98–111)
Creatinine, Ser: 0.99 mg/dL (ref 0.44–1.00)
GFR, Estimated: 56 mL/min — ABNORMAL LOW (ref >60)
Glucose, Bld: 97 mg/dL (ref 70–99)
Potassium: 3.7 mmol/L (ref 3.5–5.1)
Sodium: 140 mmol/L (ref 135–145)
Total Bilirubin: 0.4 mg/dL (ref 0.0–1.2)
Total Protein: 7.4 g/dL (ref 6.5–8.1)

## 2023-12-04 LAB — ETHANOL: Alcohol, Ethyl (B): <15 mg/dL (ref <15)

## 2023-12-04 LAB — URINALYSIS, ROUTINE W REFLEX MICROSCOPIC
Bacteria, UA: NONE SEEN
Bilirubin Urine: NEGATIVE
Glucose, UA: NEGATIVE mg/dL
Ketones, ur: NEGATIVE mg/dL
Nitrite: NEGATIVE
Protein, ur: NEGATIVE mg/dL
Specific Gravity, Urine: 1.004 — ABNORMAL LOW (ref 1.005–1.030)
pH: 7 (ref 5.0–8.0)

## 2023-12-04 LAB — URINE DRUG SCREEN
Amphetamines: NEGATIVE
Barbiturates: NEGATIVE
Benzodiazepines: NEGATIVE
Cocaine: NEGATIVE
Fentanyl: NEGATIVE
Methadone Scn, Ur: NEGATIVE
Opiates: NEGATIVE
Tetrahydrocannabinol: NEGATIVE

## 2023-12-04 LAB — CBC WITH DIFFERENTIAL/PLATELET
Abs Immature Granulocytes: 0.04 K/uL (ref 0.00–0.07)
Basophils Absolute: 0.1 K/uL (ref 0.0–0.1)
Basophils Relative: 1 %
Eosinophils Absolute: 0.7 K/uL — ABNORMAL HIGH (ref 0.0–0.5)
Eosinophils Relative: 9 %
HCT: 36.8 % (ref 36.0–46.0)
Hemoglobin: 12.1 g/dL (ref 12.0–15.0)
Immature Granulocytes: 1 %
Lymphocytes Relative: 21 %
Lymphs Abs: 1.7 K/uL (ref 0.7–4.0)
MCH: 31.5 pg (ref 26.0–34.0)
MCHC: 32.9 g/dL (ref 30.0–36.0)
MCV: 95.8 fL (ref 80.0–100.0)
Monocytes Absolute: 0.6 K/uL (ref 0.1–1.0)
Monocytes Relative: 7 %
Neutro Abs: 5.2 K/uL (ref 1.7–7.7)
Neutrophils Relative %: 61 %
Platelets: 342 K/uL (ref 150–400)
RBC: 3.84 MIL/uL — ABNORMAL LOW (ref 3.87–5.11)
RDW: 12.1 % (ref 11.5–15.5)
WBC: 8.4 K/uL (ref 4.0–10.5)
nRBC: 0 % (ref 0.0–0.2)

## 2023-12-04 MED ORDER — DONEPEZIL HCL 5 MG PO TABS
10.0000 mg | ORAL_TABLET | Freq: Every day | ORAL | Status: DC
  Administered 2023-12-04 – 2023-12-11 (×6): 10 mg via ORAL
  Filled 2023-12-04 (×8): qty 2

## 2023-12-04 MED ORDER — ASPIRIN 81 MG PO TBEC
81.0000 mg | DELAYED_RELEASE_TABLET | Freq: Every day | ORAL | Status: DC
  Administered 2023-12-04 – 2023-12-13 (×8): 81 mg via ORAL
  Filled 2023-12-04 (×11): qty 1

## 2023-12-04 MED ORDER — LEVETIRACETAM 250 MG PO TABS
250.0000 mg | ORAL_TABLET | Freq: Two times a day (BID) | ORAL | Status: DC
Start: 2023-12-04 — End: 2023-12-14
  Administered 2023-12-04 – 2023-12-13 (×17): 250 mg via ORAL
  Filled 2023-12-04 (×19): qty 1

## 2023-12-04 MED ORDER — PRAVASTATIN SODIUM 40 MG PO TABS
40.0000 mg | ORAL_TABLET | Freq: Every day | ORAL | Status: DC
  Administered 2023-12-04 – 2023-12-11 (×7): 40 mg via ORAL
  Filled 2023-12-04 (×7): qty 1

## 2023-12-04 MED ORDER — METOPROLOL SUCCINATE ER 25 MG PO TB24
12.5000 mg | ORAL_TABLET | Freq: Every day | ORAL | Status: DC
  Administered 2023-12-04 – 2023-12-13 (×10): 12.5 mg via ORAL
  Filled 2023-12-04 (×12): qty 1

## 2023-12-04 MED ORDER — CHLORTHALIDONE 25 MG PO TABS
25.0000 mg | ORAL_TABLET | Freq: Every day | ORAL | Status: DC
  Administered 2023-12-04 – 2023-12-13 (×10): 25 mg via ORAL
  Filled 2023-12-04 (×10): qty 1

## 2023-12-04 MED ORDER — AMLODIPINE BESYLATE 5 MG PO TABS
5.0000 mg | ORAL_TABLET | Freq: Every day | ORAL | Status: DC
  Administered 2023-12-04 – 2023-12-13 (×10): 5 mg via ORAL
  Filled 2023-12-04 (×12): qty 1

## 2023-12-04 MED ORDER — CITALOPRAM HYDROBROMIDE 10 MG PO TABS
10.0000 mg | ORAL_TABLET | Freq: Every day | ORAL | Status: DC
  Administered 2023-12-04 – 2023-12-13 (×10): 10 mg via ORAL
  Filled 2023-12-04 (×9): qty 1

## 2023-12-04 NOTE — ED Notes (Signed)
Sandwich and drink given.  

## 2023-12-04 NOTE — ED Notes (Signed)
 When this RN asked pt if she has a plan on killing herself and/or SI thoughts/ideations pt states no I love me and I love life
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
## 2023-12-04 NOTE — ED Notes (Signed)
 Pt given dinner tray.
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
## 2023-12-04 NOTE — ED Notes (Signed)
Pt TTS 

## 2023-12-04 NOTE — ED Provider Notes (Signed)
 [place] EMERGENCY DEPARTMENT AT [facility] Provider Note  CSN: [id number] Arrival date & time: 12/04/23 1130  Chief Complaint(s) V70.1  HPI [name] is a 83 y.o. female history of anxiety, vascular dementia, hypertension, hyperlipidemia presenting to the emergency department for suicidal ideation.  Patient apparently has been having suicidal and homicidal ideation.  She reports that maybe I said some things that should not have and I want to die but also reports I would never do anything to hurt myself.  She reports that she has anger towards her sister but would not do anything to harm her.  She told the paramedics that she was suicidal and asked them to kill her.  She denies any medical complaints, denies any headaches, chest pain, abdominal pain, shortness of breath, lightheadedness, dizziness, painful urination, back pain, abdominal pain.   Past Medical History Past Medical History:  Diagnosis Date   Anemia, normocytic normochromic 01/2012   hx of   Anxiety    takes Xanax  daily as needed   Arthritis    knees, hands   Cataract    right eye   Depression    takes Citalopram  daily   Dizziness    cardiologist is aware and told pt it was related to meds   History of bronchitis    2014   History of colon polyps    benign   History of migraine    Hyperlipidemia    takes Pravastatin  daily   Hypertension    takes Metoprolol  and Azor  daily   Joint pain    Pneumonia    hx of-2015   Primary localized osteoarthritis of right knee 10/01/2019   Pseudogout of knee, right 02/22/2017   Renal insufficiency 02/22/2017   Rheumatic fever    hx of   Wheezing on expiration    Patient Active Problem List   Diagnosis Date Noted   H/O vitrectomy 01/16/2020   Macular pucker, left eye 12/24/2019   Intermediate stage nonexudative age-related macular degeneration of both eyes 12/24/2019   Posterior vitreous detachment of right eye 12/24/2019   Pseudophakia of both eyes  12/24/2019   Primary localized osteoarthritis of right knee 10/01/2019   Lumbar disc herniation 01/24/2018   Constipation 03/09/2017   Pseudogout of knee, right 02/22/2017   Renal insufficiency 02/22/2017   Septic joint of right knee joint (HCC) 02/21/2017   Infection of right knee (HCC) 02/06/2017   Septic arthritis (HCC) 02/06/2017   Chest pain 03/04/2015   Essential hypertension    Dyslipidemia    Primary osteoarthritis of right hip 12/16/2014   Postoperative anemia due to acute blood loss 12/16/2014   Bilateral carotid artery disease (HCC) 11/05/2014   Multiple fractures of ribs of right side 06/18/2012   Pneumothorax on right 06/18/2012   Unstable angina (HCC) 01/31/2012   Cerebrovascular disease 01/31/2012   Arteriosclerotic cardiovascular disease (ASCVD)    Hyperlipidemia    Hypertension    Anemia, normocytic normochromic 01/15/2012   Mitral valve disease 12/24/2008   Home Medication(s) Prior to Admission medications   Medication Sig Start Date End Date Taking? Authorizing Provider  acetaminophen  (TYLENOL ) 500 MG tablet Take 1,000 mg by mouth every 6 (six) hours as needed for moderate pain or headache.    [provider]  ALPRAZolam  (XANAX ) 0.25 MG tablet Take 0.0625-0.25 mg by mouth 2 (two) times daily as needed for anxiety.  07/26/19   [provider]  amLODipine  (NORVASC ) 5 MG tablet Take 5 mg by mouth daily. 07/13/19  [provider]  aspirin  EC 81 MG tablet Take 81 mg by mouth daily. Swallow whole.    [provider]  chlorthalidone  (HYGROTON ) 25 MG tablet Take 25 mg by mouth daily.    [provider]  citalopram  (CELEXA ) 10 MG tablet Take 10 mg by mouth daily.    [provider]  Colchicine 0.6 MG CAPS Take 1 capsule by mouth daily as needed (gout). 01/23/19   [provider]  cyanocobalamin (VITAMIN B12) 1000 MCG/ML injection Inject 1,000 mcg into the muscle every 30 (thirty) days. 03/14/23   [provider]  donepezil (ARICEPT) 10 MG tablet Take 10 mg by mouth at bedtime. 06/07/22   [provider]  levETIRAcetam  (KEPPRA ) 250 MG tablet Take 1 tablet (250 mg total) by mouth 2 (two) times daily. 08/31/23   Camara, Amadou, MD  metoprolol  succinate (TOPROL -XL) 25 MG 24 hr tablet Take 0.5 tablets (12.5 mg total) by mouth daily. 05/26/22   Yolande Lamar BROCKS, MD  pravastatin  (PRAVACHOL ) 40 MG tablet Take 40 mg by mouth at bedtime.    [provider]                                                                                                                                    Past Surgical History Past Surgical History:  Procedure Laterality Date   ABDOMINAL HYSTERECTOMY     APPENDECTOMY     CARDIAC CATHETERIZATION     early 2000's   CARDIAC CATHETERIZATION N/A 03/04/2015   Procedure: Left Heart Cath and Coronary Angiography;  Surgeon: Ozell Fell, MD;  Location: [facility] INVASIVE CV LAB;  Service: Cardiovascular;  Laterality: N/A;   CATARACT EXTRACTION Bilateral    cataract surgery Left    CHOLECYSTECTOMY     CHONDROPLASTY Right 02/21/2017   Procedure: CHONDROPLASTY WITH DEBRIDEMENT;  Surgeon: Jane Lamar, MD;  Location: [facility];  Service: Orthopedics;  Laterality: Right;   COLONOSCOPY  2005   Negative screening study   COLONOSCOPY N/A 04/04/2014   Procedure: COLONOSCOPY;  Surgeon: Claudis RAYMOND Rivet, MD;  Location: [facility];  Service: Endoscopy;  Laterality: N/A;  1225   EXPLORATORY LAPAROTOMY     x 3   EYE SURGERY     IR GENERIC HISTORICAL  10/15/2015   IR RADIOLOGIST EVAL & MGMT 10/15/2015 [facility]   IR GENERIC HISTORICAL  10/20/2015   IR KYPHO LUMBAR INC FX REDUCE BONE BX UNI/BIL CANNULATION INC/IMAGING 10/20/2015 Thyra Nash, MD [facility]   IRRIGATION AND DEBRIDEMENT KNEE Right 02/21/2017   Procedure: IRRIGATION AND DEBRIDEMENT KNEE;  Surgeon: Jane Lamar, MD;  Location: [facility];  Service: Orthopedics;   Laterality: Right;   LUMBAR LAMINECTOMY/DECOMPRESSION MICRODISCECTOMY Left 01/24/2018   Procedure: LEFT LUMBAR FOUR- LUMBAR FIVE  LAMINOTOMY AND MICRODISCECTOMY;  Surgeon: Alix Lamar, MD;  Location[facility] OR;  Service: Neurosurgery;  Laterality: Left;  LEFT LUMBAR 4- LUMBAR 5  LAMINOTOMY AND MICRODISCECTOMY   TOTAL HIP ARTHROPLASTY Right 12/16/2014   Procedure: TOTAL HIP ARTHROPLASTY ANTERIOR APPROACH;  Surgeon: Evalene JONETTA Chancy, MD;  Location: [facility] OR;  Service: Orthopedics;  Laterality: Right;   TOTAL KNEE ARTHROPLASTY Right 10/11/2019   Procedure: TOTAL KNEE ARTHROPLASTY;  Surgeon: Jane Charleston, MD;  Location: [facility] ORS;  Service: Orthopedics;  Laterality: Right;   Family History Family History  Problem Relation Age of Onset   Heart attack Mother 63       heart problems   Cancer Mother        uterus   Pancreatitis Sister        died  age 79   Colon cancer Maternal Aunt 53   Colon cancer Maternal Aunt 78   Colon cancer Maternal Aunt 47   Cancer Other        FH   Diabetes Other        FH   Hypertension Other        FH   Diabetes Son    Diabetes Daughter    Colon cancer Maternal Uncle 34    Social History Social History   Tobacco Use   Smoking status: Former    Current packs/day: 1.00    Average packs/day: 1 pack/day for 15.0 years (15.0 ttl pk-yrs)    Types: Cigarettes   Smokeless tobacco: Never   Tobacco comments:    quit smoking 30+yrs ago  Vaping Use   Vaping status: Never Used  Substance Use Topics   Alcohol use: Yes    Alcohol/week: 3.0 standard drinks of alcohol    Types: 3 Glasses of wine per week    Comment: socially wine   Drug use: No   Allergies Nickel, Statins, Bacitracin, Gold sodium thiosulfate, and Palladium chloride  Review of Systems Review of Systems  All other systems reviewed and are negative.   Physical Exam Vital Signs  I have reviewed the triage vital signs BP (!) 121/57 (BP Location: Left Arm)   Pulse 61   Temp 98.7 F (37.1 C)  (Oral)   Resp 19   SpO2 98%  Physical Exam Vitals and nursing note reviewed.  Constitutional:      General: She is not in acute distress.    Appearance: She is well-developed.  HENT:     Head: Normocephalic and atraumatic.     Mouth/Throat:     Mouth: Mucous membranes are moist.  Eyes:     Pupils: Pupils are equal, round, and reactive to light.  Cardiovascular:     Rate and Rhythm: Normal rate and regular rhythm.     Heart sounds: No murmur heard. Pulmonary:     Effort: Pulmonary effort is normal. No respiratory distress.     Breath sounds: Normal breath sounds.  Abdominal:     General: Abdomen is flat.     Palpations: Abdomen is soft.     Tenderness: There is no abdominal tenderness.  Musculoskeletal:        General: No tenderness.     Right lower leg: No edema.     Left lower leg: No edema.  Skin:    General: Skin is warm and dry.  Neurological:     General: No focal deficit present.     Mental Status: She is alert. Mental status is at baseline.     Comments: Oriented to self, somewhat oriented to situation, knows she is in the hospital for psychiatric issue.  Not oriented to date  Psychiatric:  Mood and Affect: Mood normal.        Behavior: Behavior normal.     ED Results and Treatments Labs (all labs ordered are listed, but only abnormal results are displayed) Labs Reviewed  CBC WITH DIFFERENTIAL/PLATELET - Abnormal; Notable for the following components:      Result Value   RBC 3.84 (*)    Eosinophils Absolute 0.7 (*)    All other components within normal limits  COMPREHENSIVE METABOLIC PANEL WITH GFR - Abnormal; Notable for the following components:   GFR, Estimated 56 (*)    All other components within normal limits  URINALYSIS, ROUTINE W REFLEX MICROSCOPIC - Abnormal; Notable for the following components:   Color, Urine STRAW (*)    Specific Gravity, Urine 1.004 (*)    Hgb urine dipstick SMALL (*)    Leukocytes,Ua MODERATE (*)    All other  components within normal limits  URINE DRUG SCREEN  ETHANOL                                                                                                                          Radiology No results found.  Pertinent labs & imaging results that were available during my care of the patient were reviewed by me and considered in my medical decision making (see MDM for details).  Medications Ordered in ED Medications  amLODipine  (NORVASC ) tablet 5 mg (has no administration in time range)  aspirin  EC tablet 81 mg (has no administration in time range)  chlorthalidone  (HYGROTON ) tablet 25 mg (has no administration in time range)  citalopram  (CELEXA ) tablet 10 mg (has no administration in time range)  donepezil (ARICEPT) tablet 10 mg (has no administration in time range)  levETIRAcetam  (KEPPRA ) tablet 250 mg (has no administration in time range)  metoprolol  succinate (TOPROL -XL) 24 hr tablet 12.5 mg (has no administration in time range)  pravastatin  (PRAVACHOL ) tablet 40 mg (has no administration in time range)                                                                                                                                     Procedures Procedures  (including critical care time)  Medical Decision Making / ED Course   MDM:  83 year old presenting to the emergency department with suicidal ideation.  Patient does intermittently endorse passive suicidal ideation and apparently asked the paramedics to kill her.  Reviewed EMS note  which corroborates this.  Patient was picked up from son's house.  Differential includes underlying psychiatric process, dementia, toxic or metabolic process.  She denies any complaints and seems to be at her baseline but will discuss with family as well.  Will likely need psychiatric evaluation.  Clinical Course as of 12/04/23 1500  Mon Dec 04, 2023  1457 Discussed also with son, who also reports patient has been complaining of wanting to die,  threatening to run into traffic at home.  Has had symptoms of worsening dementia.  He has thought about wanting to have patient placed as he is worried about patient's safety at home.  Given suicidal thoughts we will consult psychiatry but if symptoms deemed more due to chronic dementia may need social work consult. [WS]    Clinical Course User Index [WS] Francesca, Elsie CROME, MD     Additional history obtained: -Additional history obtained from family and ems -External records from outside source obtained and reviewed including: Chart review including previous notes, labs, imaging, consultation notes including prior notes    Lab Tests: -I ordered, reviewed, and interpreted labs.   The pertinent results include:   Labs Reviewed  CBC WITH DIFFERENTIAL/PLATELET - Abnormal; Notable for the following components:      Result Value   RBC 3.84 (*)    Eosinophils Absolute 0.7 (*)    All other components within normal limits  COMPREHENSIVE METABOLIC PANEL WITH GFR - Abnormal; Notable for the following components:   GFR, Estimated 56 (*)    All other components within normal limits  URINALYSIS, ROUTINE W REFLEX MICROSCOPIC - Abnormal; Notable for the following components:   Color, Urine STRAW (*)    Specific Gravity, Urine 1.004 (*)    Hgb urine dipstick SMALL (*)    Leukocytes,Ua MODERATE (*)    All other components within normal limits  URINE DRUG SCREEN  ETHANOL    Notable for no UTI or other acute metabolic abnormality which would explain symptoms   Medicines ordered and prescription drug management: Meds ordered this encounter  Medications   amLODipine  (NORVASC ) tablet 5 mg   aspirin  EC tablet 81 mg   chlorthalidone  (HYGROTON ) tablet 25 mg   citalopram  (CELEXA ) tablet 10 mg   donepezil (ARICEPT) tablet 10 mg   levETIRAcetam  (KEPPRA ) tablet 250 mg   metoprolol  succinate (TOPROL -XL) 24 hr tablet 12.5 mg   pravastatin  (PRAVACHOL ) tablet 40 mg    -I have reviewed the patients  home medicines and have made adjustments as needed   Co morbidities that complicate the patient evaluation  Past Medical History:  Diagnosis Date   Anemia, normocytic normochromic 01/2012   hx of   Anxiety    takes Xanax  daily as needed   Arthritis    knees, hands   Cataract    right eye   Depression    takes Citalopram  daily   Dizziness    cardiologist is aware and told pt it was related to meds   History of bronchitis    2014   History of colon polyps    benign   History of migraine    Hyperlipidemia    takes Pravastatin  daily   Hypertension    takes Metoprolol  and Azor  daily   Joint pain    Pneumonia    hx of-2015   Primary localized osteoarthritis of right knee 10/01/2019   Pseudogout of knee, right 02/22/2017   Renal insufficiency 02/22/2017   Rheumatic fever    hx of   Wheezing  on expiration       Dispostion: Disposition decision including need for hospitalization was considered, and patient discharged from emergency department.    Final Clinical Impression(s) / ED Diagnoses Final diagnoses:  Suicidal ideations     This chart was dictated using voice recognition software.  Despite best efforts to proofread,  errors can occur which can change the documentation meaning.    Francesca Elsie CROME, MD 12/04/23 1500
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
## 2023-12-04 NOTE — ED Notes (Signed)
 Per pts son, Shawn Stebner, he has Medical Power of Gabriella, has control over her fiances, and pt does have a living will

## 2023-12-04 NOTE — ED Notes (Signed)
 Pt ambulatory with minimal assistance to the BR, but will need standby assistance for safety/fall risk

## 2023-12-05 DIAGNOSIS — F419 Anxiety disorder, unspecified: Secondary | ICD-10-CM | POA: Diagnosis not present

## 2023-12-05 MED ORDER — LORAZEPAM 2 MG/ML IJ SOLN
1.0000 mg | Freq: Once | INTRAMUSCULAR | Status: AC
  Administered 2023-12-05: 1 mg via INTRAMUSCULAR
  Filled 2023-12-05: qty 1

## 2023-12-05 MED ORDER — LORAZEPAM 1 MG PO TABS
1.0000 mg | ORAL_TABLET | Freq: Once | ORAL | Status: DC
  Filled 2023-12-05: qty 1

## 2023-12-05 MED ORDER — LORAZEPAM 2 MG/ML IJ SOLN: 1.0000 mg | Freq: Once | INTRAMUSCULAR | Status: AC

## 2023-12-05 MED ORDER — HALOPERIDOL LACTATE 5 MG/ML IJ SOLN: 5.0000 mg | Freq: Once | INTRAMUSCULAR | Status: DC

## 2023-12-05 MED ORDER — LORAZEPAM 2 MG/ML IJ SOLN
INTRAMUSCULAR | Status: AC
  Administered 2023-12-05: 1 mg via INTRAMUSCULAR
  Filled 2023-12-05: qty 1

## 2023-12-05 NOTE — ED Notes (Signed)
 Meal tray given, pt sitting down eating lunch

## 2023-12-05 NOTE — ED Notes (Signed)
 Pt was trying to leave, very unstable on feet.  Placed back in bed, pt began swinging her fists and kicking her legs.  Provider ordered ativan which was given w/ the assistance of two RNs, one NT and two security guards.  Sitter and security guards staying to talk to pt.  Pt did agree to take two of her three night time medications.  RN and tec attempted to get pt to eat dinner however she refused.  She did drink some water .

## 2023-12-05 NOTE — ED Notes (Signed)
 Pt restless, agitated, wandering, escalating. Security and PD present. Sitter present. EDP and CN notified. IM order received and initiated. Difficult to de-escalate or re-direct. Pt resistant to boundaries and direction. Becoming beligerant.

## 2023-12-05 NOTE — ED Notes (Signed)
 Pt in bed with eyes closed, sig other at bedside.
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
## 2023-12-05 NOTE — ED Notes (Signed)
 Assumed care of pt, found her in bed resting appearing to be sleeping.  RN observed slow and steady rise and fall of chest, no distress noted at this time.  Room secure and sitter watching pt.

## 2023-12-05 NOTE — ED Notes (Signed)
 Pt in bed, pt calm and cooperative, pt oriented to person and place, attempted to re orient pt, pt denies si or hi, explained plan of care and updated pt, pt then began telling about an experience with her daughter and became tearful, pt then changed subjected and began talking about her dog.  Pt has disjointed thought process/confusion.  Pt has no requests at this time.

## 2023-12-05 NOTE — ED Notes (Signed)
 Warm blanket given

## 2023-12-05 NOTE — ED Provider Notes (Signed)
 Emergency Medicine Observation Re-evaluation Note  Mackenzie [name] is a 83 y.o. female, seen on rounds today.  Pt initially presented to the ED for complaints of V70.1 Currently, the patient is awaiting placement.  Physical Exam  BP (!) 160/44 (BP Location: Left Arm)   Pulse (!) 59   Temp 98.1 F (36.7 C) (Oral)   Resp 18   SpO2 99%  Physical Exam Resting and in no acute distress  ED Course / MDM  EKG:   I have reviewed the labs performed to date as well as medications administered while in observation.  Recent changes in the last 24 hours include none.  Plan  Current plan is for placement.    Suzette Pac, MD 12/05/23 [phone]

## 2023-12-05 NOTE — [place] FL2 (Signed)
 Mackenzie [name]  MEDICAID FL2 LEVEL OF CARE FORM     IDENTIFICATION  Patient Name: Mackenzie [name] Birthdate: Aug 30, 1940 Sex: female Admission Date (Current Location): 12/04/2023  [facility] and [place] Number:  Mackenzie [name] and Address:  [facility],  [address] [place] [address]      Provider Number: [id number]  Attending Physician Name and Address:  Mackenzie Pac, MD  Relative Name and Phone Number:  Mackenzie [name] [phone]    Current Level of Care: Hospital Recommended Level of Care: Memory Care Prior Approval Number:    Date Approved/Denied:   PASRR Number:    Discharge Plan: Other (Comment) (Memory Care)    Current Diagnoses: Patient Active Problem List   Diagnosis Date Noted   H/O vitrectomy 01/16/2020   Macular pucker, left eye 12/24/2019   Intermediate stage nonexudative age-related macular degeneration of both eyes 12/24/2019   Posterior vitreous detachment of right eye 12/24/2019   Pseudophakia of both eyes 12/24/2019   Primary localized osteoarthritis of right knee 10/01/2019   Lumbar disc herniation 01/24/2018   Constipation 03/09/2017   Pseudogout of knee, right 02/22/2017   Renal insufficiency 02/22/2017   Septic joint of right knee joint (HCC) 02/21/2017   Infection of right knee (HCC) 02/06/2017   Septic arthritis (HCC) 02/06/2017   Chest pain 03/04/2015   Essential hypertension    Dyslipidemia    Primary osteoarthritis of right hip 12/16/2014   Postoperative anemia due to acute blood loss 12/16/2014   Bilateral carotid artery disease (HCC) 11/05/2014   Multiple fractures of ribs of right side 06/18/2012   Pneumothorax on right 06/18/2012   Unstable angina (HCC) 01/31/2012   Cerebrovascular disease 01/31/2012   Arteriosclerotic cardiovascular disease (ASCVD)    Hyperlipidemia    Hypertension    Anemia, normocytic normochromic 01/15/2012   Mitral valve disease 12/24/2008    Orientation RESPIRATION BLADDER Height & Weight      Self  Normal Continent Weight:   Height:     BEHAVIORAL SYMPTOMS/MOOD NEUROLOGICAL BOWEL NUTRITION STATUS  Wanderer   Continent Diet (Regular)  AMBULATORY STATUS COMMUNICATION OF NEEDS Skin   Independent Verbally Normal                       Personal Care Assistance Level of Assistance  Bathing, Feeding, Dressing Bathing Assistance: Limited assistance Feeding assistance: Independent Dressing Assistance: Limited assistance     Functional Limitations Info  Sight, Hearing, Speech Sight Info: Adequate Hearing Info: Adequate Speech Info: Adequate    SPECIAL CARE FACTORS FREQUENCY                       Contractures Contractures Info: Not present    Additional Factors Info  Code Status, Allergies Code Status Info: FULL Allergies Info: Nickel, Statins, Bacitracin, Gold Sodium Thiosulfate, Palladium Chloride           Current Medications (12/05/2023):  This is the current hospital active medication list Current Facility-Administered Medications  Medication Dose Route Frequency Provider Last Rate Last Admin   amLODipine  (NORVASC ) tablet 5 mg  5 mg Oral Daily Mackenzie Elsie CROME, MD   5 mg at 12/05/23 1044   aspirin  EC tablet 81 mg  81 mg Oral Daily Mackenzie Elsie CROME, MD   81 mg at 12/05/23 1045   chlorthalidone  (HYGROTON ) tablet 25 mg  25 mg Oral Daily Mackenzie Elsie CROME, MD   25 mg at 12/05/23 1045   citalopram  (CELEXA ) tablet 10 mg  10 mg Oral Daily Mackenzie Elsie CROME, MD   10 mg at 12/05/23 1044   donepezil (ARICEPT) tablet 10 mg  10 mg Oral QHS Mackenzie Elsie CROME, MD   10 mg at 12/04/23 2212   levETIRAcetam  (KEPPRA ) tablet 250 mg  250 mg Oral BID Mackenzie Elsie CROME, MD   250 mg at 12/05/23 1045   LORazepam (ATIVAN) injection 1 mg  1 mg Intramuscular Once [name], Joseph, MD       metoprolol  succinate (TOPROL -XL) 24 hr tablet 12.5 mg  12.5 mg Oral Daily Mackenzie Elsie CROME, MD   12.5 mg at 12/05/23 1043   pravastatin  (PRAVACHOL ) tablet 40 mg  40 mg Oral  QHS Mackenzie Elsie CROME, MD   40 mg at 12/04/23 2213   Current Outpatient Medications  Medication Sig Dispense Refill   acetaminophen  (TYLENOL ) 500 MG tablet Take 1,000 mg by mouth every 6 (six) hours as needed for moderate pain or headache.     allopurinol  (ZYLOPRIM ) 100 MG tablet Take 100 mg by mouth See admin instructions. Take 2 tablets (200 mg) by mouth daily     ALPRAZolam  (XANAX ) 0.25 MG tablet Take 0.0625-0.25 mg by mouth 2 (two) times daily as needed for anxiety.      amLODipine  (NORVASC ) 5 MG tablet Take 5 mg by mouth daily.     aspirin  EC 81 MG tablet Take 81 mg by mouth daily. Swallow whole.     chlorthalidone  (HYGROTON ) 25 MG tablet Take 25 mg by mouth daily.     citalopram  (CELEXA ) 10 MG tablet Take 10 mg by mouth daily.     Colchicine 0.6 MG CAPS Take 1 capsule by mouth daily.     cyanocobalamin (VITAMIN B12) 1000 MCG/ML injection Inject 1,000 mcg into the muscle every 30 (thirty) days.     donepezil (ARICEPT) 10 MG tablet Take 10 mg by mouth at bedtime.     levETIRAcetam  (KEPPRA ) 250 MG tablet Take 1 tablet (250 mg total) by mouth 2 (two) times daily. 180 tablet 3   metoprolol  succinate (TOPROL -XL) 25 MG 24 hr tablet Take 0.5 tablets (12.5 mg total) by mouth daily. 30 tablet 0   pravastatin  (PRAVACHOL ) 40 MG tablet Take 40 mg by mouth at bedtime.     Facility-Administered Medications Ordered in Other Encounters  Medication Dose Route Frequency Provider Last Rate Last Admin   0.9 %  sodium chloride  infusion   Intravenous Continuous Mackenzie Ronal CROME, PA-C       acetaminophen  (TYLENOL ) tablet 650 mg  650 mg Oral Q6H PRN Mackenzie Ronal CROME, PA-C       Or   acetaminophen  (TYLENOL ) suppository 650 mg  650 mg Rectal Q6H PRN Mackenzie Ronal CROME, PA-C         Discharge Medications: Please see after visit summary for a list of discharge medications.  Relevant Imaging Results:  Relevant Lab Results:   Additional Information SSN: [id number]  Mackenzie [name], [place]

## 2023-12-06 DIAGNOSIS — F419 Anxiety disorder, unspecified: Secondary | ICD-10-CM | POA: Diagnosis not present

## 2023-12-06 MED ORDER — HALOPERIDOL LACTATE 5 MG/ML IJ SOLN
2.0000 mg | Freq: Once | INTRAMUSCULAR | Status: AC
  Administered 2023-12-06: 2 mg via INTRAMUSCULAR
  Filled 2023-12-06: qty 1

## 2023-12-06 MED ORDER — TUBERCULIN PPD 5 UNIT/0.1ML ID SOLN
5.0000 [IU] | INTRADERMAL | Status: AC
  Administered 2023-12-06: 5 [IU] via INTRADERMAL
  Filled 2023-12-06: qty 0.1

## 2023-12-06 NOTE — ED Notes (Addendum)
 CSW reviewed patient chart this morning. Patient is displaying physical aggression towards staff and refusing care.  Patient behaviors consist of swinging, kicking, and scratching. CSW read about patient being in  soft restraints with no description for type of restraint. Patient was also given ativan. CSW sent Courtney at [place] [facility] [place] a secure email and she is currently on PTO until 10/28. However, she did provide a list of contacts for sales and the clinical team rep. CSW left a HIPAA confidential VM for Tim in sales and then Rowena Deleon in clinical at [phone]. Rowena did return CSW call and took down patient information and as well as CSW to give to [facility] staff. CSW will continue to follow.   Addendum 11: 51 AM  Dayonna sent CSW a secure email stating that she would be out  tomorrow to assess patient @ 11:30 AM. CSW provided her with location/ room number. CSW will continue to follow.

## 2023-12-06 NOTE — ED Notes (Addendum)
 Patient TB skin test administer to left forearm. Needs to be read 48 hrs from today.

## 2023-12-06 NOTE — ED Provider Notes (Signed)
  Physical Exam  BP (!) 152/66 (BP Location: Right Arm)   Pulse 60   Temp 97.9 F (36.6 C) (Axillary)   Resp 18   SpO2 100%   Physical Exam  Procedures  Procedures  ED Course / MDM   Clinical Course as of 12/06/23 1029  Mon Dec 04, 2023  1457 Discussed also with son, who also reports patient has been complaining of wanting to die, threatening to run into traffic at home.  Has had symptoms of worsening dementia.  He has thought about wanting to have patient placed as he is worried about patient's safety at home.  Given suicidal thoughts we will consult psychiatry but if symptoms deemed more due to chronic dementia may need social work consult. [WS]    Clinical Course User Index [WS] Francesca Elsie CROME, MD   Medical Decision Making Amount and/or Complexity of Data Reviewed Labs: ordered.  Risk OTC drugs. Prescription drug management.   Pending placement.  It appears that TB testing is requested.  QuantiFERON gold had been ordered but do not see that has been done.  Discussing with social work will get skin test placed today.  Will need read on Friday.       Patsey Lot, MD 12/06/23 1030

## 2023-12-06 NOTE — ED Notes (Signed)
 Soft restraints discontinued. Patient has no marks on her arms or legs, patient is resting comfortably with son and husband at bedside. Patient is asleep, and is in no distress at this time.

## 2023-12-06 NOTE — ED Notes (Signed)
 Patients cross necklace and diamond pendant necklace, and earrings (diamond earring, dangly) were taken off of patient and given to son and husband. Husband stated that he took the watch yesterday when patient was here.

## 2023-12-06 NOTE — ED Notes (Signed)
 Pt had managed to sleep sideways in the bed, she hit her head against the bed railing and had her legs throught the bars.  RNs and tecs had to work to get her to a safe position w/ her legs back inside the bars and her head not against the bed railing.  She attempted to get back into the same position.  RNs placed seizure padding along the sides of the bed so that she was unable to hit her head and put her feet throught the railing.  Pt was not happy and tried to kick and hit RNs.  Soothing music left on in room.  Pt settled down.

## 2023-12-06 NOTE — ED Notes (Signed)
 POA stated 4 people can visit pt. Rock Law, [name] roberts, [place], and emergency contact [facility].

## 2023-12-06 NOTE — ED Notes (Signed)
 Pt indicated that she needed to using the bathroom.  Pt was assisted up out of bed to bedside commode.  Pt has a very unsteady gait and had to be caught several times to keep from falling.  She did not want to use the bedside commode and began swinging and kicking at staff.  Staff members were able to change the bed linens b/c they were wet.  She continued to hit at and kick staff members.  This happened several times, verbal de-escalation was not effective.  Pt then scratched a staff member's arm as she was trying to keep her from falling.  Pt was placed back in the bed and soft restraints were placed per provider.  Medication was also given.

## 2023-12-06 NOTE — ED Notes (Signed)
 Spoke with patient's son, [name] Ida [facility]) who requested his mother's chart be restricted. He also requested that only four persons be allowed to visit: [name] Rummer -Alyse Handing Long-Friend Charlene Outland-Grandaughter Shawn O'Mara-Son

## 2023-12-07 DIAGNOSIS — F419 Anxiety disorder, unspecified: Secondary | ICD-10-CM | POA: Diagnosis not present

## 2023-12-07 NOTE — ED Notes (Addendum)
 CSW reviewed patient chart this morning. Dayonna with [facility] will be coming today to assess patient @ 11:30 AM. Son was made aware. CSW also shared with son another facility who patient referral was sent to yesterday , just in case [facility] could not accept. Son is aware that both facilities are in [place] since [facility] [place] does not have any openings and the only memory care facility in [place]. CSW will continue to follow.   Addendum 12:14pm   Dayonna with [facility] spoke with patient at bedside and assessed her. Afterwards , CSW and Davia spoke more about patient. Dayonna did share that patient was open to going to a facility as long as she had someone she could talk to. Patient did well with her assessment. Dayonna stated that she will go back to her team to discuss and follow back up with writer around 1pm. CSW will wait for decision before speaking with son.

## 2023-12-07 NOTE — ED Notes (Signed)
 Dayonna with [facility] here to see patient.

## 2023-12-07 NOTE — ED Notes (Signed)
 Dayonna with [facility] stated that they can accept patient and move forward with son. Dayonna stated that their  Transport planner would be reaching out to son regarding next steps and if son is able complete contracts and paperwork, their is chance that patient could DC to them tomorrow after TB reading. CSW also reached out to son and provided him with an update. CSW will continue to follow.

## 2023-12-07 NOTE — ED Provider Notes (Signed)
  Physical Exam  BP (!) 126/52 (BP Location: Right Arm)   Pulse 72   Temp 98 F (36.7 C) (Oral)   Resp 14   SpO2 99%   Physical Exam  Procedures  Procedures  ED Course / MDM   Clinical Course as of 12/07/23 0947  Mon Dec 04, 2023  1457 Discussed also with son, who also reports patient has been complaining of wanting to die, threatening to run into traffic at home.  Has had symptoms of worsening dementia.  He has thought about wanting to have patient placed as he is worried about patient's safety at home.  Given suicidal thoughts we will consult psychiatry but if symptoms deemed more due to chronic dementia may need social work consult. [WS]    Clinical Course User Index [WS] Francesca Elsie CROME, MD   Medical Decision Making Amount and/or Complexity of Data Reviewed Labs: ordered.  Risk OTC drugs. Prescription drug management.   Pending placement to memory care.  It appears that someone from [facility] will be coming here today at around 1130 to evaluate.  Has had both QuantiFERON drawn and is running but also had PPD placed yesterday.       Patsey Lot, MD 12/07/23 [phone]

## 2023-12-07 NOTE — ED Notes (Signed)
 Attempted to give meds to patient. She put them in her mouth and then spit them back out into the medicine cup. Got apple sauce to get her to take them and she spit the apple sauce back out with 4 of the pills. Attempted to get her to take them one more time and she became aggressive, hitting at me and other staff. Tucked patient back into bed, placed the seizure pads on the rails and covered with warm blankets. No other issues.

## 2023-12-08 DIAGNOSIS — F411 Generalized anxiety disorder: Secondary | ICD-10-CM | POA: Diagnosis not present

## 2023-12-08 DIAGNOSIS — F0150 Vascular dementia without behavioral disturbance: Secondary | ICD-10-CM | POA: Diagnosis not present

## 2023-12-08 DIAGNOSIS — F419 Anxiety disorder, unspecified: Secondary | ICD-10-CM | POA: Diagnosis not present

## 2023-12-08 NOTE — ED Notes (Addendum)
 1126 - Round check is complete at this time. Pt denies needing anything like food, drink or bathroom. Pt has no s/s of distress.  1220 - Round check is complete at this time. Pt denies needing anything like food, drink or bathroom. Pt has no s/s of distress.  1300 - Round check is complete at this time. Pt denies needing anything like food, drink or bathroom. Pt has no s/s of distress.  1400 - Round check is complete at this time. Pt denies needing anything like food, drink or bathroom. Pt has no s/s of distress.  1450 - Pt tried to get out the bed after husband left. Pt was guided back into the bed by staff and was reminded that the husband has left for the day and she needed to be in the bed so she does not fall.   1512 - Round check is complete at this time. Pt denies needing anything like food, drink or bathroom. Pt has no s/s of distress.  1600 - Round check is complete at this time. Pt denies needing anything like food, drink or bathroom. Pt has no s/s of distress. Pt was changed and cleaned up by staff  1700 - Round check is complete at this time. Pt denies needing anything like food, drink or bathroom. Pt has no s/s of distress.  1730 - Round check is complete at this time. Pt denies needing anything like food, drink or bathroom. Pt has no s/s of distress. Pt ate most of her dinner tray  1800 Round check is complete at this time. Pt denies needing anything like food, drink or bathroom. Pt has no s/s of distress.  1900 - Round check is complete at this time. Pt is resting with no s/s of distress.   2030 - Round check is complete at this time. Pt is resting with no s/s of distress.   2100 - Round check is complete at this time. Pt asked to go to the restroom. By the time, the staff got a wheelchair. The pt no longer had to go.  Pt has no s/s of distress.  2200 - Round check is complete at this time. Pt denies needing anything like food, drink or bathroom. Pt has no s/s of distress. Pt  has taken most of her covers off and does not want them back on.

## 2023-12-08 NOTE — ED Notes (Signed)
 Assumed care of pt, found her resting in bed.  RN observed slow and steady rise and fall of chest.  NO distress noted at this time.

## 2023-12-08 NOTE — ED Provider Notes (Signed)
 Emergency Medicine Observation Re-evaluation Note  Mackenzie [name] is a 83 y.o. female, seen on rounds today.  Pt initially presented to the ED for complaints of V70.1 Currently, the patient is asleep.  Physical Exam  BP (!) 126/58 (BP Location: Right Arm)   Pulse 68   Temp 97.8 F (36.6 C) (Oral)   Resp 18   SpO2 98%  Physical Exam General: nad   ED Course / MDM  EKG:   I have reviewed the labs performed to date as well as medications administered while in observation.  Recent changes in the last 24 hours include no change.  Plan  Current plan is for placement.    Elnor Jayson LABOR, DO 12/08/23 [phone]

## 2023-12-08 NOTE — Consult Note (Addendum)
 [facility] Consult Follow-up  Patient Name: .Mackenzie [name]  MRN: [id number]  DOB: 05/03/1940  Consult Order details:  Orders (From admission, onward)     Start     Ordered   12/04/23 1448  CONSULT TO CALL ACT TEAM       Ordering Provider: Francesca Elsie CROME, MD  Provider:  (Not yet assigned)  Question:  Reason for Consult?  Answer:  Psych consult   12/04/23 1448            Mode of Visit: Tele-visit Virtual Statement:TELE PSYCHIATRY ATTESTATION & CONSENT As the provider for this telehealth consult, I attest that I verified the patient's identity using two separate identifiers, introduced myself to the patient, provided my credentials, disclosed my location, and performed this encounter via a HIPAA-compliant, real-time, face-to-face, two-way, interactive audio and video platform and with the full consent and agreement of the patient (or guardian as applicable.) Patient physical location: [facility]. Telehealth provider physical location: home office in state of [place].   Video start time: 1515 Video end time: 1535.   Psychiatry Consult Evaluation  Service Date: December 08, 2023 LOS:  LOS: 0 days  Chief Complaint: maybe I said some things that should not have and I want to die but also reports I would never do anything to hurt myself.   Primary Psychiatric Diagnoses  Anxiety 2.  Vascular dementia  Assessment  HPI: Mackenzie [name] is a 83 y.o. female admitted: Presented to the EDfor 12/04/2023 11:32 AM for SI. She carries the psychiatric diagnoses of anxiety and has a past medical history of  hypertension, hyperlipidemia, scoliosis & mitral valve disease.   Assessment: When writer connected with the patient, she was lying in bed with the covers over her head and required multiple positive verbal reinforcements to take them off and engage in assessment. Patient is oriented to person only, [facility] is. She  states that the month is June, and yesterday was May. Unsure what the year is. Patient is reoriented of place, & time, and asked of her understanding of the reason for hospitalization. Shares that she is at the hospital because I got into it with my sister yesterday. Elaborates further that she got into an argument about money which she gave to her sister to help her purchase a home. Patient becomes tangential with flight of ideas, and rambles about multiple other topics.   Patient is repeatedly asked if she made suicidal statements, and denies it, currently denies suicidal ideations, denies past attempts, denies current intent or plans to harm herself or any one else. Denies auditory or visual hallucinations, denies paranoia, does not seem to be responding to any internal/external stimuli.  Her current presentation of altered mental status is most consistent with neurocognitive impairments related to her diagnosis of vascular dementia. We are clearing the patient from Psychiatry service at this time, and making other recommendations as noted below.   Diagnoses:  Active Hospital problems: Vascular dementia  Plan   ## Psychiatric Medication Recommendations:  Celexa  10 mg daily for GAD & MDD Aricept 10 mg nightly for cognitive impairments  ## Medical Decision Making Capacity: Not specifically addressed in this encounter  ## Further Work-up:  -Follow up with outpatient Psychiatry service for continuity of care -Follow up with medical providers for other medical conditions  ## Disposition:-- Plan Post Discharge/Psychiatric Care Follow-up resources Included in the After visit summary-Please refer to the  list of resources and make an appointment for outpatient Psychiatry management if you do not have a Psychiatrist of mental health provider.  ## Behavioral / Environmental: -Patient would benefit from more frequent contact with medical team to delineate plan of care and allow for clarification  questions, which will help alleviate anxiety regarding treatment. If possible, try to check back in with the pt in the afternoon. or Utilize compassion and acknowledge the patient's experiences while setting clear and realistic expectations for care.  ## Safety and Observation Level:  - Based on my clinical evaluation, I estimate the patient to be at risk of self harm in the current setting. - At this time, we recommend  routine. This decision is based on my review of the chart including patient's history and current presentation, interview of the patient, mental status examination, and consideration of suicide risk including evaluating suicidal ideation, plan, intent, suicidal or self-harm behaviors, risk factors, and protective factors. This judgment is based on our ability to directly address suicide risk, implement suicide prevention strategies, and develop a safety plan while the patient is in the clinical setting. Please contact our team if there is a concern that risk level has changed.  CSSR Risk Category:C-SSRS RISK CATEGORY: Low Risk  Suicide Risk Assessment: Patient has following modifiable risk factors for suicide: social isolation, which we are addressing by recommending that patient maintain a structured living environment with a good support system at discharge. Patient has following non-modifiable or demographic risk factors for suicide: early widowhood Patient has the following protective factors against suicide: Access to outpatient mental health care, Supportive family, Supportive friends, no history of suicide attempts, and no history of NSSIB  Thank you for this consult request. Recommendations have been communicated to the primary team. Psychiatry will recuse  from pt's care at this time.  We are clearing the patient from Psychiatry service at this time.  Donia Snell, NP   Exam Findings  Physical Exam:  Vital Signs:  Temp:  [97.8 F (36.6 C)-98.6 F (37 C)] 98.6 F (37  C) (10/24 1021) Pulse Rate:  [68-72] 72 (10/24 1021) Resp:  [18] 18 (10/24 1021) BP: (126-142)/(58-59) 142/59 (10/24 1021) SpO2:  [97 %-98 %] 97 % (10/24 1021) Blood pressure (!) 142/59, pulse 72, temperature 98.6 F (37 C), temperature source Oral, resp. rate 18, SpO2 97%. There is no height or weight on file to calculate BMI.  Physical Exam Vitals reviewed.  Constitutional:      Appearance: Normal appearance.  Neurological:     Mental Status: She is alert.     Mental Status Exam: General Appearance: Casual  Orientation:  Other:  disoriented  Memory:  Immediate;   Fair  Concentration:  Concentration: Fair  Recall:  Poor  Attention  Poor  Eye Contact:  Poor  Speech:  Normal Rate  Language:  Fair  Volume:  Normal  Mood: anxious   Affect:  Blunt and Depressed  Thought Process:  Disorganized  Thought Content:  Illogical  Suicidal Thoughts:  No  Homicidal Thoughts:  No  Judgement:  Poor  Insight:  Shallow  Psychomotor Activity:  not assessed-in bed at time of teleassessment  Akathisia:  NA  Fund of Knowledge:  Poor      Assets:  Housing Resilience Social Support  Cognition:  Impaired,  Severe  ADL's:  not assessed  AIMS (if indicated):   /a     Other History   These have been pulled in through the EMR, reviewed, and  updated if appropriate.  Family History:  The patient's family history includes Cancer in her mother and another family member; Colon cancer (age of onset: 76) in her maternal uncle; Colon cancer (age of onset: 67) in her maternal aunt; Colon cancer (age of onset: 52) in her maternal aunt; Colon cancer (age of onset: 7) in her maternal aunt; Diabetes in her daughter, son, and another family member; Heart attack (age of onset: 40) in her mother; Hypertension in an other family member; Pancreatitis in her sister.  Medical History: Past Medical History:  Diagnosis Date   Anemia, normocytic normochromic 01/2012   hx of   Anxiety    takes Xanax  daily  as needed   Arthritis    knees, hands   Cataract    right eye   Depression    takes Citalopram  daily   Dizziness    cardiologist is aware and told pt it was related to meds   History of bronchitis    2014   History of colon polyps    benign   History of migraine    Hyperlipidemia    takes Pravastatin  daily   Hypertension    takes Metoprolol  and Azor  daily   Joint pain    Pneumonia    hx of-2015   Primary localized osteoarthritis of right knee 10/01/2019   Pseudogout of knee, right 02/22/2017   Renal insufficiency 02/22/2017   Rheumatic fever    hx of   Wheezing on expiration     Surgical History: Past Surgical History:  Procedure Laterality Date   ABDOMINAL HYSTERECTOMY     APPENDECTOMY     CARDIAC CATHETERIZATION     early 2000's   CARDIAC CATHETERIZATION N/A 03/04/2015   Procedure: Left Heart Cath and Coronary Angiography;  Surgeon: Ozell Fell, MD;  Location: [facility] INVASIVE CV LAB;  Service: Cardiovascular;  Laterality: N/A;   CATARACT EXTRACTION Bilateral    cataract surgery Left    CHOLECYSTECTOMY     CHONDROPLASTY Right 02/21/2017   Procedure: CHONDROPLASTY WITH DEBRIDEMENT;  Surgeon: Jane Charleston, MD;  Location: [facility];  Service: Orthopedics;  Laterality: Right;   COLONOSCOPY  2005   Negative screening study   COLONOSCOPY N/A 04/04/2014   Procedure: COLONOSCOPY;  Surgeon: Claudis RAYMOND Rivet, MD;  Location: [facility];  Service: Endoscopy;  Laterality: N/A;  1225   EXPLORATORY LAPAROTOMY     x 3   EYE SURGERY     IR GENERIC HISTORICAL  10/15/2015   IR RADIOLOGIST EVAL & MGMT 10/15/2015 [facility]   IR GENERIC HISTORICAL  10/20/2015   IR KYPHO LUMBAR INC FX REDUCE BONE BX UNI/BIL CANNULATION INC/IMAGING 10/20/2015 Thyra Nash, MD [facility]   IRRIGATION AND DEBRIDEMENT KNEE Right 02/21/2017   Procedure: IRRIGATION AND DEBRIDEMENT KNEE;  Surgeon: Jane Charleston, MD;  Location:  SURGERY CENTER;  Service: Orthopedics;  Laterality:  Right;   LUMBAR LAMINECTOMY/DECOMPRESSION MICRODISCECTOMY Left 01/24/2018   Procedure: LEFT LUMBAR FOUR- LUMBAR FIVE  LAMINOTOMY AND MICRODISCECTOMY;  Surgeon: Alix Charleston, MD;  Location: [facility];  Service: Neurosurgery;  Laterality: Left;  LEFT LUMBAR 4- LUMBAR 5  LAMINOTOMY AND MICRODISCECTOMY   TOTAL HIP ARTHROPLASTY Right 12/16/2014   Procedure: TOTAL HIP ARTHROPLASTY ANTERIOR APPROACH;  Surgeon: Evalene JONETTA Chancy, MD;  Location: [facility] OR;  Service: Orthopedics;  Laterality: Right;   TOTAL KNEE ARTHROPLASTY Right 10/11/2019   Procedure: TOTAL KNEE ARTHROPLASTY;  Surgeon: Jane Charleston, MD;  Location: [facility] ORS;  Service: Orthopedics;  Laterality: Right;  Medications:   Current Facility-Administered Medications:    amLODipine  (NORVASC ) tablet 5 mg, 5 mg, Oral, Daily, Scheving, Elsie CROME, MD, 5 mg at 12/08/23 1008   aspirin  EC tablet 81 mg, 81 mg, Oral, Daily, Francesca Elsie CROME, MD, 81 mg at 12/08/23 1007   chlorthalidone  (HYGROTON ) tablet 25 mg, 25 mg, Oral, Daily, Francesca Elsie CROME, MD, 25 mg at 12/08/23 1007   citalopram  (CELEXA ) tablet 10 mg, 10 mg, Oral, Daily, Francesca Elsie CROME, MD, 10 mg at 12/08/23 1007   donepezil (ARICEPT) tablet 10 mg, 10 mg, Oral, QHS, Francesca Elsie CROME, MD, 10 mg at 12/07/23 2118   haloperidol lactate (HALDOL) injection 5 mg, 5 mg, Intramuscular, Once, Zammit, Joseph, MD   levETIRAcetam  (KEPPRA ) tablet 250 mg, 250 mg, Oral, BID, Francesca Elsie CROME, MD, 250 mg at 12/08/23 1008   metoprolol  succinate (TOPROL -XL) 24 hr tablet 12.5 mg, 12.5 mg, Oral, Daily, Francesca Elsie CROME, MD, 12.5 mg at 12/08/23 1008   pravastatin  (PRAVACHOL ) tablet 40 mg, 40 mg, Oral, QHS, Francesca Elsie CROME, MD, 40 mg at 12/07/23 2118  Current Outpatient Medications:    acetaminophen  (TYLENOL ) 500 MG tablet, Take 1,000 mg by mouth every 6 (six) hours as needed for moderate pain or headache., Disp: , Rfl:    aspirin  EC 81 MG tablet, Take 81 mg by mouth daily. Swallow whole., Disp: ,  Rfl:    chlorthalidone  (HYGROTON ) 25 MG tablet, Take 25 mg by mouth daily., Disp: , Rfl:    citalopram  (CELEXA ) 10 MG tablet, Take 10 mg by mouth daily., Disp: , Rfl:    Colchicine 0.6 MG CAPS, Take 1 capsule by mouth daily as needed (gout)., Disp: , Rfl:    donepezil (ARICEPT) 10 MG tablet, Take 10 mg by mouth at bedtime., Disp: , Rfl:    Hydrolyzed Silk (COGNIUM MEMORY EXTRA STR) TABS, Take 1 tablet by mouth every evening., Disp: , Rfl:    levETIRAcetam  (KEPPRA ) 250 MG tablet, Take 1 tablet (250 mg total) by mouth 2 (two) times daily., Disp: 180 tablet, Rfl: 3   metoprolol  succinate (TOPROL -XL) 25 MG 24 hr tablet, Take 0.5 tablets (12.5 mg total) by mouth daily. (Patient taking differently: Take 25 mg by mouth daily.), Disp: 30 tablet, Rfl: 0   pravastatin  (PRAVACHOL ) 40 MG tablet, Take 40 mg by mouth at bedtime., Disp: , Rfl:   Facility-Administered Medications Ordered in Other Encounters:    0.9 %  sodium chloride  infusion, , Intravenous, Continuous, Stanbery, Mary L, PA-C   acetaminophen  (TYLENOL ) tablet 650 mg, 650 mg, Oral, Q6H PRN **OR** acetaminophen  (TYLENOL ) suppository 650 mg, 650 mg, Rectal, Q6H PRN, Jule Ronal CROME, PA-C  Allergies: Allergies  Allergen Reactions   Nickel Itching, Swelling and Rash    Possibility... still trying to figure out   Statins Other (See Comments)    Atorvastatin , pravastatin , simvastatin  Myalgias    Bacitracin Rash   Gold Sodium Thiosulfate Rash   Palladium Chloride Other (See Comments)    Positive patch test     Donia Snell, NP

## 2023-12-08 NOTE — ED Notes (Signed)
 Checked pt brief. Pt is dry at this time.

## 2023-12-08 NOTE — ED Notes (Signed)
 CSW reached out to Dayonna today to see if Sales was able to get in contact with patient son to start process. Dayonna stated that Sales attempted to reach son yesterday and no response and provided CSW with sales contact. CSW then reached out to son and informed him that [employer] attempted to reach him. Son stated that he has made contact with Sales this morning. CSW will wait to hear from son or facility about next step. Patient TB reading will be done today and CSW will send over. Depending on the process with contracts/paperwork being completed, financial aspects, and getting patient room setup, it is likely that patient will DC Monday. CSW will continue to follow and update staff.

## 2023-12-08 NOTE — ED Notes (Signed)
 CSW reached back out to Mackenzie [name] because she stated that she would send CSW a email . CSW has not received any emails and made her aware. CSW will continue to follow.

## 2023-12-08 NOTE — ED Notes (Signed)
 Pts TB is negative on left forearm.

## 2023-12-08 NOTE — ED Notes (Signed)
 Observed pt repositioning self in bed, no complaints at this time

## 2023-12-09 DIAGNOSIS — F419 Anxiety disorder, unspecified: Secondary | ICD-10-CM | POA: Diagnosis not present

## 2023-12-09 LAB — QUANTIFERON-TB GOLD PLUS (RQFGPL)
QuantiFERON Mitogen Value: >10 [IU]/mL
QuantiFERON Nil Value: 0.07 [IU]/mL
QuantiFERON TB1 Ag Value: 0.06 [IU]/mL
QuantiFERON TB2 Ag Value: 0.05 [IU]/mL

## 2023-12-09 LAB — QUANTIFERON-TB GOLD PLUS: QuantiFERON-TB Gold Plus: NEGATIVE

## 2023-12-09 MED ORDER — ASPIRIN 81 MG PO CHEW
81.0000 mg | CHEWABLE_TABLET | Freq: Once | ORAL | Status: AC
  Administered 2023-12-09: 81 mg via ORAL

## 2023-12-09 NOTE — ED Notes (Signed)
 Family at bedside.

## 2023-12-09 NOTE — ED Notes (Signed)
 Pt wheeled to bathroom with 1 assist. Pt finally agreed to take her PO medications. Meds crushed and given to pt in apple sauce.

## 2023-12-09 NOTE — ED Notes (Addendum)
 Pt awake and cooperating with meds crushed in applesauce.  Sitter remains at bedside

## 2023-12-09 NOTE — ED Provider Notes (Signed)
 Emergency Medicine Observation Re-evaluation Note  Mackenzie [name] is a 83 y.o. female, seen on rounds today.  Pt initially presented to the ED for complaints of V70.1 Currently, the patient is sleeping.  Physical Exam  BP (!) 142/59 (BP Location: Left Arm)   Pulse 72   Temp 98.6 F (37 C) (Oral)   Resp 18   SpO2 97%  Physical Exam General: Sleeping Cardiac: Extremities well-perfused Lungs: Breathing is unlabored Psych: Deferred  ED Course / MDM  EKG:   I have reviewed the labs performed to date as well as medications administered while in observation.  Recent changes in the last 24 hours include evaluation by psychiatry yesterday.  Recommendations are for Celexa  and Aricept, and outpatient psychiatry management.  She is cleared from psychiatry service at this time.  Plan  Current plan is for placement.    Melvenia Motto, MD 12/09/23 [phone]

## 2023-12-09 NOTE — ED Notes (Signed)
 Pt resting comfortably in bed with eyes closed. Equal chest rise and fall noted. Sitter at bedside

## 2023-12-09 NOTE — ED Notes (Signed)
 Round check is complete at this time. Pt denies needing anything like food, drink or bathroom. Pt has no s/s of distress. Pt is laying on the stretcher watching TV

## 2023-12-09 NOTE — ED Notes (Signed)
 Patient is resting comfortably.

## 2023-12-10 ENCOUNTER — Emergency Department (HOSPITAL_COMMUNITY)

## 2023-12-10 DIAGNOSIS — F419 Anxiety disorder, unspecified: Secondary | ICD-10-CM | POA: Diagnosis not present

## 2023-12-10 DIAGNOSIS — R935 Abnormal findings on diagnostic imaging of other abdominal regions, including retroperitoneum: Secondary | ICD-10-CM | POA: Diagnosis not present

## 2023-12-10 DIAGNOSIS — I700 Atherosclerosis of aorta: Secondary | ICD-10-CM | POA: Diagnosis not present

## 2023-12-10 DIAGNOSIS — K838 Other specified diseases of biliary tract: Secondary | ICD-10-CM | POA: Diagnosis not present

## 2023-12-10 DIAGNOSIS — I3139 Other pericardial effusion (noninflammatory): Secondary | ICD-10-CM | POA: Diagnosis not present

## 2023-12-10 DIAGNOSIS — Z0389 Encounter for observation for other suspected diseases and conditions ruled out: Secondary | ICD-10-CM | POA: Diagnosis not present

## 2023-12-10 DIAGNOSIS — S2231XA Fracture of one rib, right side, initial encounter for closed fracture: Secondary | ICD-10-CM | POA: Diagnosis not present

## 2023-12-10 LAB — COMPREHENSIVE METABOLIC PANEL WITH GFR
ALT: 10 U/L (ref 0–44)
ALT: 7 U/L (ref 0–44)
AST: 25 U/L (ref 15–41)
AST: 19 U/L (ref 15–41)
Albumin: 4.2 g/dL (ref 3.5–5.0)
Albumin: 3.6 g/dL (ref 3.5–5.0)
Alkaline Phosphatase: 105 U/L (ref 38–126)
Alkaline Phosphatase: 84 U/L (ref 38–126)
Anion gap: 18 — ABNORMAL HIGH (ref 5–15)
Anion gap: 12 (ref 5–15)
BUN: 41 mg/dL — ABNORMAL HIGH (ref 8–23)
BUN: 34 mg/dL — ABNORMAL HIGH (ref 8–23)
CO2: 26 mmol/L (ref 22–32)
CO2: 28 mmol/L (ref 22–32)
Calcium: 10 mg/dL (ref 8.9–10.3)
Calcium: 9 mg/dL (ref 8.9–10.3)
Chloride: 96 mmol/L — ABNORMAL LOW (ref 98–111)
Chloride: 95 mmol/L — ABNORMAL LOW (ref 98–111)
Creatinine, Ser: 1.19 mg/dL — ABNORMAL HIGH (ref 0.44–1.00)
Creatinine, Ser: 0.87 mg/dL (ref 0.44–1.00)
GFR, Estimated: 45 mL/min — ABNORMAL LOW (ref >60)
GFR, Estimated: >60 mL/min (ref >60)
Glucose, Bld: 160 mg/dL — ABNORMAL HIGH (ref 70–99)
Glucose, Bld: 151 mg/dL — ABNORMAL HIGH (ref 70–99)
Potassium: 4.4 mmol/L (ref 3.5–5.1)
Potassium: 3.2 mmol/L — ABNORMAL LOW (ref 3.5–5.1)
Sodium: 139 mmol/L (ref 135–145)
Sodium: 135 mmol/L (ref 135–145)
Total Bilirubin: 0.8 mg/dL (ref 0.0–1.2)
Total Bilirubin: 0.4 mg/dL (ref 0.0–1.2)
Total Protein: 8.7 g/dL — ABNORMAL HIGH (ref 6.5–8.1)
Total Protein: 7.1 g/dL (ref 6.5–8.1)

## 2023-12-10 LAB — CBC WITH DIFFERENTIAL/PLATELET
Abs Immature Granulocytes: 0.11 K/uL — ABNORMAL HIGH (ref 0.00–0.07)
Abs Immature Granulocytes: 0.05 K/uL (ref 0.00–0.07)
Basophils Absolute: 0.1 K/uL (ref 0.0–0.1)
Basophils Absolute: 0.1 K/uL (ref 0.0–0.1)
Basophils Relative: 0 %
Basophils Relative: 0 %
Eosinophils Absolute: 0 K/uL (ref 0.0–0.5)
Eosinophils Absolute: 0 K/uL (ref 0.0–0.5)
Eosinophils Relative: 0 %
Eosinophils Relative: 0 %
HCT: 40.5 % (ref 36.0–46.0)
HCT: 33 % — ABNORMAL LOW (ref 36.0–46.0)
Hemoglobin: 13.3 g/dL (ref 12.0–15.0)
Hemoglobin: 11.1 g/dL — ABNORMAL LOW (ref 12.0–15.0)
Immature Granulocytes: 1 %
Immature Granulocytes: 0 %
Lymphocytes Relative: 10 %
Lymphocytes Relative: 15 %
Lymphs Abs: 1.7 K/uL (ref 0.7–4.0)
Lymphs Abs: 2.1 K/uL (ref 0.7–4.0)
MCH: 31 pg (ref 26.0–34.0)
MCH: 31.2 pg (ref 26.0–34.0)
MCHC: 32.8 g/dL (ref 30.0–36.0)
MCHC: 33.6 g/dL (ref 30.0–36.0)
MCV: 94.4 fL (ref 80.0–100.0)
MCV: 92.7 fL (ref 80.0–100.0)
Monocytes Absolute: 1.9 K/uL — ABNORMAL HIGH (ref 0.1–1.0)
Monocytes Absolute: 1.7 K/uL — ABNORMAL HIGH (ref 0.1–1.0)
Monocytes Relative: 11 %
Monocytes Relative: 12 %
Neutro Abs: 13.2 K/uL — ABNORMAL HIGH (ref 1.7–7.7)
Neutro Abs: 10 K/uL — ABNORMAL HIGH (ref 1.7–7.7)
Neutrophils Relative %: 78 %
Neutrophils Relative %: 73 %
Platelets: 456 K/uL — ABNORMAL HIGH (ref 150–400)
Platelets: 360 K/uL (ref 150–400)
RBC: 4.29 MIL/uL (ref 3.87–5.11)
RBC: 3.56 MIL/uL — ABNORMAL LOW (ref 3.87–5.11)
RDW: 11.8 % (ref 11.5–15.5)
RDW: 11.7 % (ref 11.5–15.5)
WBC: 17 K/uL — ABNORMAL HIGH (ref 4.0–10.5)
WBC: 13.9 K/uL — ABNORMAL HIGH (ref 4.0–10.5)
nRBC: 0 % (ref 0.0–0.2)
nRBC: 0 % (ref 0.0–0.2)

## 2023-12-10 LAB — LACTIC ACID, PLASMA
Lactic Acid, Venous: 3.9 mmol/L (ref 0.5–1.9)
Lactic Acid, Venous: 1.7 mmol/L (ref 0.5–1.9)
Lactic Acid, Venous: 1.2 mmol/L (ref 0.5–1.9)

## 2023-12-10 LAB — URINALYSIS, W/ REFLEX TO CULTURE (INFECTION SUSPECTED)
Bacteria, UA: NONE SEEN
Bilirubin Urine: NEGATIVE
Glucose, UA: NEGATIVE mg/dL
Ketones, ur: 5 mg/dL — AB
Leukocytes,Ua: NEGATIVE
Nitrite: NEGATIVE
Protein, ur: 100 mg/dL — AB
Specific Gravity, Urine: 1.028 (ref 1.005–1.030)
pH: 5 (ref 5.0–8.0)

## 2023-12-10 LAB — TROPONIN T, HIGH SENSITIVITY
Troponin T High Sensitivity: <15 ng/L (ref 0–19)
Troponin T High Sensitivity: <15 ng/L (ref 0–19)

## 2023-12-10 LAB — RESP PANEL BY RT-PCR (RSV, FLU A&B, COVID)  RVPGX2
Influenza A by PCR: NEGATIVE
Influenza B by PCR: NEGATIVE
Resp Syncytial Virus by PCR: NEGATIVE
SARS Coronavirus 2 by RT PCR: NEGATIVE

## 2023-12-10 LAB — MAGNESIUM: Magnesium: 1.9 mg/dL (ref 1.7–2.4)

## 2023-12-10 LAB — LIPASE, BLOOD: Lipase: 42 U/L (ref 11–51)

## 2023-12-10 LAB — PRO BRAIN NATRIURETIC PEPTIDE: Pro Brain Natriuretic Peptide: 474 pg/mL — ABNORMAL HIGH (ref <300.0)

## 2023-12-10 MED ORDER — VANCOMYCIN HCL IN DEXTROSE 1-5 GM/200ML-% IV SOLN
1000.0000 mg | Freq: Once | INTRAVENOUS | Status: AC
  Administered 2023-12-10: 1000 mg via INTRAVENOUS
  Filled 2023-12-10: qty 200

## 2023-12-10 MED ORDER — SODIUM CHLORIDE 0.9 % IV SOLN
Freq: Once | INTRAVENOUS | Status: AC
Start: 2023-12-10 — End: 2023-12-11

## 2023-12-10 MED ORDER — ASPIRIN 81 MG PO CHEW
81.0000 mg | CHEWABLE_TABLET | Freq: Once | ORAL | Status: AC
  Administered 2023-12-10: 81 mg via ORAL
  Filled 2023-12-10: qty 1

## 2023-12-10 MED ORDER — CEFTRIAXONE SODIUM 1 G IJ SOLR
1.0000 g | Freq: Once | INTRAMUSCULAR | Status: AC
  Administered 2023-12-10: 1 g via INTRAVENOUS
  Filled 2023-12-10: qty 10

## 2023-12-10 MED ORDER — LACTATED RINGERS IV BOLUS (SEPSIS)
1000.0000 mL | Freq: Once | INTRAVENOUS | Status: AC
  Administered 2023-12-10: 1000 mL via INTRAVENOUS

## 2023-12-10 MED ORDER — IOHEXOL 350 MG/ML SOLN
75.0000 mL | Freq: Once | INTRAVENOUS | Status: AC | PRN
Start: 2023-12-10 — End: 2023-12-10
  Administered 2023-12-10: 75 mL via INTRAVENOUS

## 2023-12-10 NOTE — ED Notes (Signed)
 Attempted to feed pt but she refused. Pt said she was not hungry at this time.

## 2023-12-10 NOTE — ED Provider Notes (Signed)
 Patient has been in the emergency department for approximately 6 days waiting for placement for suicidal ideation.  Charge nurse just brought the patient's situation to my attention.  Apparently this morning she became less active than she normally is.  She is usually combative and agitated and was lethargic.  It looks like Dr. Melvenia initiated the workup for possible altered mental status.  This included laboratory studies demonstrating a new leukocytosis of 17 with previous labs from 12/04/2023 with a white count of 8.4.  She also has a lactic acidosis of 3.9.  And a new AKI with a creatinine of 1.19.  Previous creatinine was 0.99.  Urine analysis demonstrated no urinary tract infection.  Dr. Melvenia also ordered a CT chest abdomen and pelvis that demonstrated no acute infectious etiologies.  Blood cultures were sent and are pending.  She was given IV fluids.  It does not appear that antibiotics were started.  It does not appear that the patient was admitted for sepsis.   Physical Exam  BP (!) 138/45   Pulse 80   Temp (!) 97.5 F (36.4 C) (Axillary)   Resp 18   SpO2 98%   Physical Exam  Procedures  Procedures  ED Course / MDM   Clinical Course as of 12/10/23 1946  Mon Dec 04, 2023  1457 Discussed also with son, who also reports patient has been complaining of wanting to die, threatening to run into traffic at home.  Has had symptoms of worsening dementia.  He has thought about wanting to have patient placed as he is worried about patient's safety at home.  Given suicidal thoughts we will consult psychiatry but if symptoms deemed more due to chronic dementia may need social work consult. [WS]    Clinical Course User Index [WS] Francesca Elsie CROME, MD   Medical Decision Making Amount and/or Complexity of Data Reviewed Labs: ordered.  Risk OTC drugs. Prescription drug management.   Vancomycin  and Rocephin  started.  Patient recommended for admission for possible sepsis. Antibiotics  started. Iv fluids started.   Covid/influence negative Blood cx pending still from this morning  I spoke with our hospitalist Dr. Lawence who is requesting we observe the patient longer in the ED. Concerns for dehydration and demarginalization resulting in leukocytosis and lactic acidosis. Pt signed out to oncoming EDP Dr. Maryagnes.          Elnor Bernarda SQUIBB, DO 12/11/23 0005

## 2023-12-10 NOTE — ED Notes (Signed)
 This nurse crushed the meds in applesauce so its easier for the pt to take medications. This nurse asked the pt multiple times to eat some of the applesauce. Pt refuses and keeps spitting out things in her mouth that is not there.

## 2023-12-10 NOTE — ED Notes (Signed)
 Pt stated she needs to urinate. Bedpan placed but pt was unable to go. Nurse notified.

## 2023-12-10 NOTE — ED Notes (Signed)
 Pt stated she needed to use the restroom and was able to roll to be put on a bedpan without issues, there were hotpacks under pt which I removed as pt felt really warm.

## 2023-12-10 NOTE — ED Provider Notes (Addendum)
 Emergency Medicine Observation Re-evaluation Note  Mackenzie [name] is a 83 y.o. female, seen on rounds today.  Pt initially presented to the ED for complaints of V70.1 Currently, the patient is sleeping.  Physical Exam  BP (!) 142/59 (BP Location: Left Arm)   Pulse 72   Temp 98.6 F (37 C) (Oral)   Resp 18   SpO2 98%  Physical Exam General: Sleeping Cardiac: Extremities perfused Lungs: Unlabored breathing Psych: Deferred  ED Course / MDM  EKG:   I have reviewed the labs performed to date as well as medications administered while in observation.  Recent changes in the last 24 hours include staying in bed throughout the day yesterday.  Today, had generalized weakness and requires assistance to transfer to bedside commode.  She had onset of nonbloody diarrhea, nausea, and vomiting today.  Vital signs remain normal.  Repeat lab work was ordered.  Patient does have a leukocytosis and lactic acidosis.  IV fluids were ordered.  Currently, she is not localizing any areas of discomfort.  Abdomen is soft and nontender.  Breathing is unlabored.  X-ray does not show any acute findings.  CT imaging did not show any acute findings.  Workup overall showed no source of infection.  Patient may have developed some enteritis and dehydration.  Her lactate normalized after IV fluids.  Patient's weakness improved as well.  Stool studies were ordered if she does have ongoing diarrhea.  Plan  Current plan is for placement.    Melvenia Motto, MD 12/10/23 [phone]    Melvenia Motto, MD 12/10/23 [phone]

## 2023-12-11 ENCOUNTER — Emergency Department (HOSPITAL_COMMUNITY)

## 2023-12-11 DIAGNOSIS — I672 Cerebral atherosclerosis: Secondary | ICD-10-CM | POA: Diagnosis not present

## 2023-12-11 DIAGNOSIS — F419 Anxiety disorder, unspecified: Secondary | ICD-10-CM | POA: Diagnosis not present

## 2023-12-11 DIAGNOSIS — R410 Disorientation, unspecified: Secondary | ICD-10-CM | POA: Diagnosis not present

## 2023-12-11 DIAGNOSIS — I6782 Cerebral ischemia: Secondary | ICD-10-CM | POA: Diagnosis not present

## 2023-12-11 LAB — BASIC METABOLIC PANEL WITH GFR
Anion gap: 12 (ref 5–15)
BUN: 30 mg/dL — ABNORMAL HIGH (ref 8–23)
CO2: 27 mmol/L (ref 22–32)
Calcium: 8.7 mg/dL — ABNORMAL LOW (ref 8.9–10.3)
Chloride: 98 mmol/L (ref 98–111)
Creatinine, Ser: 0.85 mg/dL (ref 0.44–1.00)
GFR, Estimated: >60 mL/min (ref >60)
Glucose, Bld: 104 mg/dL — ABNORMAL HIGH (ref 70–99)
Potassium: 3.4 mmol/L — ABNORMAL LOW (ref 3.5–5.1)
Sodium: 138 mmol/L (ref 135–145)

## 2023-12-11 LAB — LACTIC ACID, PLASMA: Lactic Acid, Venous: 0.9 mmol/L (ref 0.5–1.9)

## 2023-12-11 LAB — CBC WITH DIFFERENTIAL/PLATELET
Abs Immature Granulocytes: 0.06 K/uL (ref 0.00–0.07)
Basophils Absolute: 0.1 K/uL (ref 0.0–0.1)
Basophils Relative: 0 %
Eosinophils Absolute: 0.1 K/uL (ref 0.0–0.5)
Eosinophils Relative: 1 %
HCT: 32.4 % — ABNORMAL LOW (ref 36.0–46.0)
Hemoglobin: 11 g/dL — ABNORMAL LOW (ref 12.0–15.0)
Immature Granulocytes: 1 %
Lymphocytes Relative: 11 %
Lymphs Abs: 1.4 K/uL (ref 0.7–4.0)
MCH: 31.6 pg (ref 26.0–34.0)
MCHC: 34 g/dL (ref 30.0–36.0)
MCV: 93.1 fL (ref 80.0–100.0)
Monocytes Absolute: 1.5 K/uL — ABNORMAL HIGH (ref 0.1–1.0)
Monocytes Relative: 12 %
Neutro Abs: 9.1 K/uL — ABNORMAL HIGH (ref 1.7–7.7)
Neutrophils Relative %: 75 %
Platelets: 343 K/uL (ref 150–400)
RBC: 3.48 MIL/uL — ABNORMAL LOW (ref 3.87–5.11)
RDW: 11.8 % (ref 11.5–15.5)
WBC: 12.1 K/uL — ABNORMAL HIGH (ref 4.0–10.5)
nRBC: 0 % (ref 0.0–0.2)

## 2023-12-11 NOTE — ED Provider Notes (Signed)
  Provider Note MRN:  [id number]  Arrival date & time: 12/11/23    ED Course and Medical Decision Making  Assumed care of patient at sign-out or upon transfer.  Patient boarding for psychiatric health exhibited some lethargy recently, day team attempted hospitalist admission for observation for possible sepsis, declined by hospitalist team, plan is for further observation this evening and reassessment of labs in the morning.  7 AM update: Morning labs reassuring, CT head normal.  No real indication for admission, will continue to monitor during her boarding status here in the emergency department.  Procedures  Final Clinical Impressions(s) / ED Diagnoses     ICD-10-CM   1. Suicidal ideations  R45.851     2. Lethargy  R53.83       ED Discharge Orders     None       Discharge Instructions   None     Ozell HERO. Theadore, MD [facility] Emergency Medicine [facility] [email or web address]    Theadore Ozell HERO, MD 12/11/23 [phone]

## 2023-12-11 NOTE — ED Notes (Signed)
 CSW received a call this morning from Dayonna with [facility] about needing an FL2. CSW expressed with her that FL2 was sent last week with initial referral and she stated that they could not use the hospital FL2 so another FL2 was completed , signed by MD, and sent back to Dayonna secure email. Dayonna did shared that paperwork and contracts were signed. CSW awaiting for response regarding next steps of DC.

## 2023-12-11 NOTE — ED Notes (Signed)
 Pt transported to CT ?

## 2023-12-11 NOTE — ED Notes (Signed)
 Pt tolerated meds crushed in apple sauce, pt provided with warm blankets.

## 2023-12-11 NOTE — ED Notes (Signed)
 Patient transported to CT

## 2023-12-12 DIAGNOSIS — F419 Anxiety disorder, unspecified: Secondary | ICD-10-CM | POA: Diagnosis not present

## 2023-12-12 LAB — CBC WITH DIFFERENTIAL/PLATELET
Abs Immature Granulocytes: 0.06 K/uL (ref 0.00–0.07)
Basophils Absolute: 0.1 K/uL (ref 0.0–0.1)
Basophils Relative: 0 %
Eosinophils Absolute: 0.1 K/uL (ref 0.0–0.5)
Eosinophils Relative: 1 %
HCT: 32.4 % — ABNORMAL LOW (ref 36.0–46.0)
Hemoglobin: 11 g/dL — ABNORMAL LOW (ref 12.0–15.0)
Immature Granulocytes: 1 %
Lymphocytes Relative: 11 %
Lymphs Abs: 1.3 K/uL (ref 0.7–4.0)
MCH: 30.9 pg (ref 26.0–34.0)
MCHC: 34 g/dL (ref 30.0–36.0)
MCV: 91 fL (ref 80.0–100.0)
Monocytes Absolute: 1.2 K/uL — ABNORMAL HIGH (ref 0.1–1.0)
Monocytes Relative: 10 %
Neutro Abs: 9.2 K/uL — ABNORMAL HIGH (ref 1.7–7.7)
Neutrophils Relative %: 77 %
Platelets: 377 K/uL (ref 150–400)
RBC: 3.56 MIL/uL — ABNORMAL LOW (ref 3.87–5.11)
RDW: 11.2 % — ABNORMAL LOW (ref 11.5–15.5)
WBC: 12 K/uL — ABNORMAL HIGH (ref 4.0–10.5)
nRBC: 0 % (ref 0.0–0.2)

## 2023-12-12 LAB — COMPREHENSIVE METABOLIC PANEL WITH GFR
ALT: 11 U/L (ref 0–44)
AST: 32 U/L (ref 15–41)
Albumin: 3.4 g/dL — ABNORMAL LOW (ref 3.5–5.0)
Alkaline Phosphatase: 85 U/L (ref 38–126)
Anion gap: 15 (ref 5–15)
BUN: 24 mg/dL — ABNORMAL HIGH (ref 8–23)
CO2: 26 mmol/L (ref 22–32)
Calcium: 8.8 mg/dL — ABNORMAL LOW (ref 8.9–10.3)
Chloride: 94 mmol/L — ABNORMAL LOW (ref 98–111)
Creatinine, Ser: 0.67 mg/dL (ref 0.44–1.00)
GFR, Estimated: >60 mL/min (ref >60)
Glucose, Bld: 99 mg/dL (ref 70–99)
Potassium: 2.6 mmol/L — CL (ref 3.5–5.1)
Sodium: 135 mmol/L (ref 135–145)
Total Bilirubin: 0.6 mg/dL (ref 0.0–1.2)
Total Protein: 7.1 g/dL (ref 6.5–8.1)

## 2023-12-12 LAB — BASIC METABOLIC PANEL WITH GFR
Anion gap: 14 (ref 5–15)
BUN: 24 mg/dL — ABNORMAL HIGH (ref 8–23)
CO2: 24 mmol/L (ref 22–32)
Calcium: 9 mg/dL (ref 8.9–10.3)
Chloride: 95 mmol/L — ABNORMAL LOW (ref 98–111)
Creatinine, Ser: 0.69 mg/dL (ref 0.44–1.00)
GFR, Estimated: >60 mL/min (ref >60)
Glucose, Bld: 101 mg/dL — ABNORMAL HIGH (ref 70–99)
Potassium: 3.6 mmol/L (ref 3.5–5.1)
Sodium: 132 mmol/L — ABNORMAL LOW (ref 135–145)

## 2023-12-12 LAB — MAGNESIUM
Magnesium: 1.9 mg/dL (ref 1.7–2.4)
Magnesium: 2.5 mg/dL — ABNORMAL HIGH (ref 1.7–2.4)

## 2023-12-12 LAB — TSH: TSH: 1.69 u[IU]/mL (ref 0.350–4.500)

## 2023-12-12 LAB — CK: Total CK: 384 U/L — ABNORMAL HIGH (ref 38–234)

## 2023-12-12 MED ORDER — POTASSIUM CHLORIDE 10 MEQ/100ML IV SOLN
10.0000 meq | INTRAVENOUS | Status: AC
  Administered 2023-12-12 (×4): 10 meq via INTRAVENOUS
  Filled 2023-12-12 (×4): qty 100

## 2023-12-12 MED ORDER — MAGNESIUM SULFATE 2 GM/50ML IV SOLN
2.0000 g | Freq: Once | INTRAVENOUS | Status: AC
  Administered 2023-12-12: 2 g via INTRAVENOUS
  Filled 2023-12-12: qty 50

## 2023-12-12 MED ORDER — POTASSIUM CHLORIDE CRYS ER 20 MEQ PO TBCR: 20.0000 meq | EXTENDED_RELEASE_TABLET | Freq: Two times a day (BID) | ORAL | Status: DC

## 2023-12-12 MED ORDER — POTASSIUM CHLORIDE CRYS ER 20 MEQ PO TBCR
40.0000 meq | EXTENDED_RELEASE_TABLET | ORAL | Status: DC
  Administered 2023-12-12 – 2023-12-13 (×3): 40 meq via ORAL
  Filled 2023-12-12 (×4): qty 2

## 2023-12-12 NOTE — ED Notes (Signed)
 Pt appears to be declining in status. Per previous RN, pt was ambulatory in hallway independently upon initial visit to ER and is currently unable to get up with assistance to bedside commode. Pt appears more agitated and restless at this time. AC, Consulting Civil Engineer, and EDP made aware of pt current status and pts need for PT/OT as well as social work to assist with placement with a higher level of skilled nursing facility. See new orders. AC and charge RN to pass along to dayshift staff.

## 2023-12-12 NOTE — Evaluation (Signed)
Physical Therapy Evaluation Patient Details Name: MANUELITA MOXON MRN: [id number] DOB: August 03, 1940 Today's Date: 12/12/2023  History of Present Illness  HINDA LINDOR is a 83 y.o. female history of anxiety, vascular dementia, hypertension, hyperlipidemia presenting to the emergency department for suicidal ideation.  Patient apparently has been having suicidal and homicidal ideation.  She reports that maybe I said some things that should not have and I want to die but also reports I would never do anything to hurt myself.  She reports that she has anger towards her sister but would not do anything to harm her.  She told the paramedics that she was suicidal and asked them to kill her.  She denies any medical complaints, denies any headaches, chest pain, abdominal pain, shortness of breath, lightheadedness, dizziness, painful urination, back pain, abdominal pain.   Clinical Impression  Patient demonstrates slow labored movement for sitting up at bedside with c/o neck pain/discomfort, required increased time and repeated verbal/tactile cueing, once standing, patient very unsteady on feet and limited to a few slow labored side steps before having to sit due to BLE weakness and fall risk. Patient tolerated sitting up in chair after therapy with nursing staff present. Patient will benefit from continued skilled physical therapy in hospital and recommended venue below to increase strength, balance, endurance for safe ADLs and gait.           If plan is discharge home, recommend the following: A lot of help with walking and/or transfers;A lot of help with bathing/dressing/bathroom;Assistance with cooking/housework;Assist for transportation;Help with stairs or ramp for entrance   Can travel by private vehicle   No    Equipment Recommendations None recommended by PT  Recommendations for Other Services       Functional Status Assessment Patient has had a recent decline in their functional status and  demonstrates the ability to make significant improvements in function in a reasonable and predictable amount of time.     Precautions / Restrictions Precautions Precautions: Fall Recall of Precautions/Restrictions: Impaired Restrictions Weight Bearing Restrictions Per Provider Order: No      Mobility  Bed Mobility Overal bed mobility: Needs Assistance Bed Mobility: Supine to Sit     Supine to sit: Max assist     General bed mobility comments: slow labored movement    Transfers Overall transfer level: Needs assistance Equipment used: Rolling walker (2 wheels) Transfers: Sit to/from Stand, Bed to chair/wheelchair/BSC Sit to Stand: Mod assist, Max assist   Step pivot transfers: Mod assist, Max assist       General transfer comment: unsteady on feet, difficulty taking steps due to weakness    Ambulation/Gait Ambulation/Gait assistance: Max assist Gait Distance (Feet): 3 Feet Assistive device: Rolling walker (2 wheels) Gait Pattern/deviations: Decreased step length - right, Decreased step length - left, Decreased stride length, Knees buckling Gait velocity: slow     General Gait Details: limited to a few slow labored side steps before having to sit due to buckling of knees/weakness  Stairs            Wheelchair Mobility     Tilt Bed    Modified Rankin (Stroke Patients Only)       Balance Overall balance assessment: Needs assistance Sitting-balance support: Feet unsupported, Bilateral upper extremity supported Sitting balance-Leahy Scale: Poor Sitting balance - Comments: fair/poor seated at EOB   Standing balance support: Reliant on assistive device for balance, During functional activity, Bilateral upper extremity supported Standing balance-Leahy Scale: Poor Standing balance comment: using  RW                             Pertinent Vitals/Pain Pain Assessment Pain Assessment: Faces Faces Pain Scale: Hurts little more Pain Location:  neck Pain Descriptors / Indicators: Discomfort, Grimacing, Guarding, Sore    Home Living Family/patient expects to be discharged to:: Private residence Living Arrangements: Alone Available Help at Discharge: Family Type of Home: House Home Access: Stairs to enter Entrance Stairs-Rails: Left Entrance Stairs-Number of Steps: 3   Home Layout: One level Home Equipment: Agricultural Consultant (2 wheels);Cane - single point      Prior Function Prior Level of Function : Needs assist       Physical Assist : Mobility (physical);ADLs (physical) Mobility (physical): Bed mobility;Transfers;Gait;Stairs   Mobility Comments: Household and short distanced community ambulation without AD ADLs Comments: Independent household, assisted by family for community     Extremity/Trunk Assessment   Upper Extremity Assessment Upper Extremity Assessment: Defer to OT evaluation    Lower Extremity Assessment Lower Extremity Assessment: Generalized weakness    Cervical / Trunk Assessment Cervical / Trunk Assessment: Kyphotic  Communication   Communication Communication: No apparent difficulties    Cognition Arousal: Alert Behavior During Therapy: Agitated, Anxious   PT - Cognitive impairments: History of cognitive impairments                       PT - Cognition Comments: required repeated verbal/tactile cueing for following instructions Following commands: Impaired       Cueing Cueing Techniques: Verbal cues, Gestural cues, Tactile cues     General Comments      Exercises     Assessment/Plan    PT Assessment Patient needs continued PT services  PT Problem List Decreased strength;Decreased activity tolerance;Decreased balance;Decreased mobility       PT Treatment Interventions DME instruction;Gait training;Stair training;Functional mobility training;Therapeutic activities;Therapeutic exercise;Balance training;Patient/family education    PT Goals (Current goals can be found  in the Care Plan section)  Acute Rehab PT Goals Patient Stated Goal: return home PT Goal Formulation: With patient Time For Goal Achievement: 12/20/2023 Potential to Achieve Goals: Good    Frequency Min 2X/week     Co-evaluation PT/OT/SLP Co-Evaluation/Treatment: Yes Reason for Co-Treatment: To address functional/ADL transfers PT goals addressed during session: Mobility/safety with mobility;Balance;Proper use of DME         AM-PAC PT 6 Clicks Mobility  Outcome Measure Help needed turning from your back to your side while in a flat bed without using bedrails?: A Lot Help needed moving from lying on your back to sitting on the side of a flat bed without using bedrails?: A Lot Help needed moving to and from a bed to a chair (including a wheelchair)?: A Lot Help needed standing up from a chair using your arms (e.g., wheelchair or bedside chair)?: A Lot Help needed to walk in hospital room?: A Lot Help needed climbing 3-5 steps with a railing? : Total 6 Click Score: 11    End of Session   Activity Tolerance: Patient tolerated treatment well;Patient limited by fatigue;Patient limited by pain Patient left: in chair;with call bell/phone within reach;with nursing/sitter in room Nurse Communication: Mobility status PT Visit Diagnosis: Unsteadiness on feet (R26.81);Other abnormalities of gait and mobility (R26.89);Muscle weakness (generalized) (M62.81)    Time: 9046-8981 PT Time Calculation (min) (ACUTE ONLY): 25 min   Charges:   PT Evaluation $PT Eval Moderate Complexity: 1 Mod PT  Treatments $Therapeutic Activity: 23-37 mins PT General Charges $$ ACUTE PT VISIT: 1 Visit         12:42 PM, 12/12/23 Lynwood Music, MPT Physical Therapist with [facility] [phone] office [phone] mobile phone

## 2023-12-12 NOTE — ED Provider Notes (Addendum)
 Nursing staff reports that patient had been ambulatory when she initially presented, now is unable to stand.  I have ordered repeat CBC and comprehensive metabolic panel as well as CK to rule out rhabdomyolysis.  I have ordered physical therapy and Occupational Therapy evaluation.   Raford Lenis, MD 12/12/23 0427  Potassium is extremely low. I will order IV and oral potassium, check serum magnesium .   Raford Lenis, MD 12/12/23 0624  CK is only modestly elevated, not sufficient to account for her difficulty walking.  Magnesium  is normal but in the lower range of normal.  I have ordered some intravenous magnesium .  I note that she is on a diuretic, she will need ongoing potassium supplementation.   Raford Lenis, MD 12/12/23 [phone]

## 2023-12-12 NOTE — Plan of Care (Signed)
  Problem: Acute Rehab PT Goals(only PT should resolve) Goal: Pt Will Go Supine/Side To Sit Outcome: Progressing Flowsheets (Taken 12/12/2023 1244) Pt will go Supine/Side to Sit:  with minimal assist  with moderate assist Goal: Patient Will Transfer Sit To/From Stand Outcome: Progressing Flowsheets (Taken 12/12/2023 1244) Patient will transfer sit to/from stand:  with minimal assist  with moderate assist Goal: Pt Will Transfer Bed To Chair/Chair To Bed Outcome: Progressing Flowsheets (Taken 12/12/2023 1244) Pt will Transfer Bed to Chair/Chair to Bed:  with min assist  with mod assist Goal: Pt Will Ambulate Outcome: Progressing Flowsheets (Taken 12/12/2023 1244) Pt will Ambulate:  25 feet  with minimal assist  with moderate assist  with rolling walker   12:44 PM, 12/12/23 Lynwood Music, MPT Physical Therapist with [facility] [phone] office [phone] mobile phone

## 2023-12-12 NOTE — Evaluation (Signed)
Occupational Therapy Evaluation Patient Details Name: Mackenzie [name] MRN: [id number] DOB: 12-01-1940 Today's Date: 12/12/2023   History of Present Illness   Mackenzie [name] is a 83 y.o. female history of anxiety, vascular dementia, hypertension, hyperlipidemia presenting to the emergency department for suicidal ideation.  Patient apparently has been having suicidal and homicidal ideation.  She reports that maybe I said some things that should not have and I want to die but also reports I would never do anything to hurt myself.  She reports that she has anger towards her sister but would not do anything to harm her.  She told the paramedics that she was suicidal and asked them to kill her.  She denies any medical complaints, denies any headaches, chest pain, abdominal pain, shortness of breath, lightheadedness, dizziness, painful urination, back pain, abdominal pain.     Clinical Impressions Pt admitted for concerns listed above. PTA pt was ambulating and living independently. At this time, she presents with further decreased cognition, poor motor control, limited mobility, decreased balance, and difficulty following 1 step directions. Recommending pt have further skilled OT to improve mobility and independent ADL's. Acute OT will continue to follow.      If plan is discharge home, recommend the following:   A lot of help with walking and/or transfers;A lot of help with bathing/dressing/bathroom     Functional Status Assessment   Patient has had a recent decline in their functional status and demonstrates the ability to make significant improvements in function in a reasonable and predictable amount of time.     Equipment Recommendations   None recommended by OT     Recommendations for Other Services         Precautions/Restrictions   Precautions Precautions: Fall Recall of Precautions/Restrictions: Impaired Restrictions Weight Bearing Restrictions Per Provider Order:  No     Mobility Bed Mobility Overal bed mobility: Needs Assistance Bed Mobility: Supine to Sit     Supine to sit: Max assist     General bed mobility comments: slow labored movement    Transfers Overall transfer level: Needs assistance Equipment used: Rolling walker (2 wheels) Transfers: Sit to/from Stand, Bed to chair/wheelchair/BSC Sit to Stand: Max assist   Squat pivot transfers: Max assist       General transfer comment: unsteady on feet, difficulty taking steps due to weakness      Balance Overall balance assessment: Needs assistance Sitting-balance support: Feet unsupported, Bilateral upper extremity supported Sitting balance-Leahy Scale: Poor Sitting balance - Comments: fair/poor seated at EOB   Standing balance support: Reliant on assistive device for balance, During functional activity, Bilateral upper extremity supported Standing balance-Leahy Scale: Poor                             ADL either performed or assessed with clinical judgement   ADL Overall ADL's : Needs assistance/impaired Eating/Feeding: Set up;Sitting   Grooming: Minimal assistance;Sitting   Upper Body Bathing: Moderate assistance;Sitting   Lower Body Bathing: Sitting/lateral leans;Sit to/from stand;Maximal assistance   Upper Body Dressing : Minimal assistance;Sitting   Lower Body Dressing: Maximal assistance;Sitting/lateral leans;Sit to/from stand   Toilet Transfer: Total assistance;Squat-pivot   Toileting- Clothing Manipulation and Hygiene: Maximal assistance;Sitting/lateral lean;Sit to/from stand         General ADL Comments: Pt with increased fear of falling and weakness, unable to follow commands, requiring max to total assist with all aDL's     Vision Baseline Vision/History: 0  No visual deficits Ability to See in Adequate Light: 0 Adequate Patient Visual Report: No change from baseline Vision Assessment?: No apparent visual deficits Additional Comments: Pt  kept eyes closed entire session     Perception         Praxis         Pertinent Vitals/Pain Pain Assessment Pain Assessment: Faces Faces Pain Scale: Hurts little more Pain Location: neck Pain Descriptors / Indicators: Discomfort, Grimacing, Guarding, Sore     Extremity/Trunk Assessment Upper Extremity Assessment Upper Extremity Assessment: Generalized weakness   Lower Extremity Assessment Lower Extremity Assessment: Defer to PT evaluation   Cervical / Trunk Assessment Cervical / Trunk Assessment: Kyphotic   Communication Communication Communication: No apparent difficulties   Cognition Arousal: Alert Behavior During Therapy: Agitated, Anxious Cognition: History of cognitive impairments                               Following commands: Impaired Following commands impaired: Follows one step commands inconsistently     Cueing  General Comments   Cueing Techniques: Verbal cues;Gestural cues;Tactile cues  VSS on RA   Exercises     Shoulder Instructions      Home Living Family/patient expects to be discharged to:: Private residence Living Arrangements: Alone Available Help at Discharge: Family Type of Home: House Home Access: Stairs to enter Secretary/administrator of Steps: 3 Entrance Stairs-Rails: Left Home Layout: One level     Bathroom Shower/Tub: Producer, Television/film/video: Handicapped height Bathroom Accessibility: Yes   Home Equipment: Agricultural Consultant (2 wheels);Cane - single point          Prior Functioning/Environment Prior Level of Function : Needs assist       Physical Assist : Mobility (physical);ADLs (physical) Mobility (physical): Bed mobility;Transfers;Gait;Stairs   Mobility Comments: Household and short distanced community ambulation without AD ADLs Comments: Independent household, assisted by family for community    OT Problem List: Decreased strength;Decreased range of motion;Decreased activity  tolerance;Impaired balance (sitting and/or standing);Decreased cognition;Decreased safety awareness;Impaired UE functional use   OT Treatment/Interventions: Self-care/ADL training;Therapeutic exercise;Energy conservation;Neuromuscular education;DME and/or AE instruction;Manual therapy;Therapeutic activities      OT Goals(Current goals can be found in the care plan section)   Acute Rehab OT Goals Patient Stated Goal: None stated OT Goal Formulation: With patient Time For Goal Achievement: 12/27/2023 Potential to Achieve Goals: Fair   OT Frequency:  Min 1X/week    Co-evaluation PT/OT/SLP Co-Evaluation/Treatment: Yes Reason for Co-Treatment: To address functional/ADL transfers PT goals addressed during session: Mobility/safety with mobility;Balance;Proper use of DME OT goals addressed during session: ADL's and self-care;Strengthening/ROM      AM-PAC OT 6 Clicks Daily Activity     Outcome Measure Help from another person eating meals?: A Little Help from another person taking care of personal grooming?: A Little Help from another person toileting, which includes using toliet, bedpan, or urinal?: A Lot Help from another person bathing (including washing, rinsing, drying)?: A Lot Help from another person to put on and taking off regular upper body clothing?: A Lot Help from another person to put on and taking off regular lower body clothing?: A Lot 6 Click Score: 14   End of Session Equipment Utilized During Treatment: Rolling walker (2 wheels) Nurse Communication: Mobility status  Activity Tolerance: Patient tolerated treatment well Patient left: in chair;with nursing/sitter in room  OT Visit Diagnosis: Unsteadiness on feet (R26.81);Other abnormalities of gait and mobility (R26.89);Muscle weakness (generalized) (  M62.81)                Time: 8994-8977 OT Time Calculation (min): 17 min Charges:  OT General Charges $OT Visit: 1 Visit OT Evaluation $OT Eval Moderate Complexity:  1 Mod  Severin Bou Thelbert, OTR/L [facility]  Encarnacion Scioneaux Jillyn Thelbert 12/12/2023, 4:29 PM

## 2023-12-12 NOTE — ED Notes (Addendum)
 CSW sent patient therapy note via secure email to Dayonna at [facility] to see if she could manage patient since she is suppose to DC tomorrow to [facility]. CSW is awaiting on response and follow up with son.   Addendum 1:30 pm   CSW spoke with son and shared with him PT recommendation for SNF. Son stated that patient has gotten weaker being in the bed and not getting up for a week .CSW sent referral to [facility] and they declined, awaiting for [facility] rehab to respond back in [facility] to see if they can accept for short-term rehab. CSW will continue to follow.

## 2023-12-12 NOTE — ED Provider Notes (Signed)
 Emergency Medicine Observation Re-evaluation Note  Mackenzie [name] is a 83 y.o. female, seen on rounds today.  Pt initially presented to the ED for complaints of V70.1 Currently, the patient is resting in bed.  Physical Exam  BP 130/69 (BP Location: Left Arm)   Pulse 85   Temp 98.4 F (36.9 C) (Oral)   Resp 17   Ht 5' (1.524 m)   Wt 41.5 kg   SpO2 99%   BMI 17.87 kg/m  Physical Exam General: Awake, nondistressed Cardiac: Normal heart rate Lungs: Unlabored breathing Psych: No agitation  ED Course / MDM  EKG:   I have reviewed the labs performed to date as well as medications administered while in observation.  Recent changes in the last 24 hours include repeat lab work due to ongoing generalized weakness.  She is now found to have hypokalemia.  Replacement potassium has been ordered.  Plan  Current plan is for placement.    Melvenia Motto, MD 12/12/23 [phone]

## 2023-12-12 NOTE — ED Notes (Addendum)
 CSW sent Dayonna a message this morning responding back to what she said when asked about DC yesterday. Dayonna shared that son stated that patient will DC today since she does not have furniture in her room. She states that son will move patient in today in the late evening. CSW will follow up with son this morning to see what time patient will leave hospital. CSW will continue follow.   Addendum 9:02 AM   CSW spoke with patient son this morning regarding furniture being delivered for DC today, per Dayonna with [facility]. Son stated that patient bed will not be delivered until tomorrow from mattress firm. CSW then followed back up with Dayonna regarding bed delivery and other concerns that son had raised . Dayonna stated that she would follow up with son after we spoke. CSW assured her to leave him a VM or send him a text if he does not answer since he is still getting things situated for himself and patient move. CSW will continue to follow.

## 2023-12-13 DIAGNOSIS — F419 Anxiety disorder, unspecified: Secondary | ICD-10-CM | POA: Diagnosis not present

## 2023-12-13 NOTE — TOC CM/SW Note (Cosign Needed Addendum)
 Transition of Care (TOC) CM/SW Note    30 Day Note  To whom it May Concern:  Please be advised that the above name patient will require a short-term nursing home stay- anticipated 30 days or less rehabilitation and strengthening. The plan is for return home.

## 2023-12-13 NOTE — ED Provider Notes (Signed)
   Dementia note  To whom it may concern:   Please be advised that the above-named patient has a primary diagnosis of dementia which supersedes any psychiatric diagnosis.   Elnor Savant A, DO 12/13/23 1048

## 2023-12-13 NOTE — ED Provider Notes (Signed)
 30 Day Note:    To whom it May Concern:   Please be advised that the above name patient will require a short-term nursing home stay- anticipated 30 days or less rehabilitation and strengthening. The plan is for return home.         Elnor Savant A, DO 12/13/23 1049

## 2023-12-13 NOTE — ED Provider Notes (Signed)
 Emergency Medicine Observation Re-evaluation Note  Mackenzie [name] is a 83 y.o. female, seen on rounds today.  Pt initially presented to the ED for complaints of V70.1 Currently, the patient is resting.  Physical Exam  BP (!) 141/65   Pulse 87   Temp 98.4 F (36.9 C) (Oral)   Resp (!) 23   Ht 5' (1.524 m)   Wt 41.5 kg   SpO2 98%   BMI 17.87 kg/m  Physical Exam General: nad   ED Course / MDM  EKG:EKG Interpretation Date/Time:  Sunday December 10 2023 13:14:56 EDT Ventricular Rate:  84 PR Interval:    QRS Duration:  84 QT Interval:  376 QTC Calculation: 444 R Axis:   64  Text Interpretation: Accelerated Junctional rhythm Nonspecific ST and T wave abnormality Abnormal ECG When compared with ECG of 24-Aug-2023 16:00, PREVIOUS ECG IS PRESENT Confirmed by Mackenzie [name] [phone]) on 12/12/2023 10:53:45 PM  I have reviewed the labs performed to date as well as medications administered while in observation.  Recent changes in the last 24 hours include no acute events reported overnight. She completed PT yestd. Potential discharge today > [facility]  Plan  Current plan is for placement.    Mackenzie [name] LABOR, DO 12/13/23 [phone]

## 2023-12-13 NOTE — [place] FL2 (Signed)
 [place]  MEDICAID FL2 LEVEL OF CARE FORM     IDENTIFICATION  Patient Name: Mackenzie [name] Birthdate: September 14, 1940 Sex: female Admission Date (Current Location): 12/04/2023  [facility] and [place] Number:  [employer] and Address:  [facility],  [address] [place] [address]      Provider Number: [id number]  Attending Physician Name and Address:  Elnor Jayson LABOR, DO  Relative Name and Phone Number:  [name] Ida [phone]    Current Level of Care: Hospital Recommended Level of Care: Skilled Nursing Facility Prior Approval Number:    Date Approved/Denied:   PASRR Number:    Discharge Plan: SNF    Current Diagnoses: Patient Active Problem List   Diagnosis Date Noted   H/O vitrectomy 01/16/2020   Macular pucker, left eye 12/24/2019   Intermediate stage nonexudative age-related macular degeneration of both eyes 12/24/2019   Posterior vitreous detachment of right eye 12/24/2019   Pseudophakia of both eyes 12/24/2019   Primary localized osteoarthritis of right knee 10/01/2019   Lumbar disc herniation 01/24/2018   Constipation 03/09/2017   Pseudogout of knee, right 02/22/2017   Renal insufficiency 02/22/2017   Septic joint of right knee joint (HCC) 02/21/2017   Infection of right knee (HCC) 02/06/2017   Septic arthritis (HCC) 02/06/2017   Chest pain 03/04/2015   Essential hypertension    Dyslipidemia    Primary osteoarthritis of right hip 12/16/2014   Postoperative anemia due to acute blood loss 12/16/2014   Bilateral carotid artery disease (HCC) 11/05/2014   Multiple fractures of ribs of right side 06/18/2012   Pneumothorax on right 06/18/2012   Unstable angina (HCC) 01/31/2012   Cerebrovascular disease 01/31/2012   Arteriosclerotic cardiovascular disease (ASCVD)    Hyperlipidemia    Hypertension    Anemia, normocytic normochromic 01/15/2012   Mitral valve disease 12/24/2008    Orientation RESPIRATION BLADDER Height & Weight     Self   Normal Continent Weight: 91 lb 8 oz (41.5 kg) Height:  5' (152.4 cm)  BEHAVIORAL SYMPTOMS/MOOD NEUROLOGICAL BOWEL NUTRITION STATUS  Wanderer   Continent Diet (Soft)  AMBULATORY STATUS COMMUNICATION OF NEEDS Skin   Extensive Assist Verbally Normal                       Personal Care Assistance Level of Assistance  Bathing, Feeding, Dressing Bathing Assistance: Maximum assistance Feeding assistance: Independent Dressing Assistance: Maximum assistance     Functional Limitations Info  Sight, Hearing, Speech Sight Info: Adequate Hearing Info: Adequate Speech Info: Adequate    SPECIAL CARE FACTORS FREQUENCY  PT (By licensed PT), OT (By licensed OT)     PT Frequency: 5 x a week OT Frequency: 5 x a week            Contractures Contractures Info: Not present    Additional Factors Info  Code Status, Allergies Code Status Info: FULL Allergies Info: Nickel, Statins, Bacitracin, Gold Sodium Thiosulfate, Palladium Chloride           Current Medications (12/13/2023):  This is the current hospital active medication list Current Facility-Administered Medications  Medication Dose Route Frequency Provider Last Rate Last Admin   amLODipine  (NORVASC ) tablet 5 mg  5 mg Oral Daily Francesca Elsie CROME, MD   5 mg at 12/12/23 9078   aspirin  EC tablet 81 mg  81 mg Oral Daily Francesca Elsie CROME, MD   81 mg at 12/12/23 9078   chlorthalidone  (HYGROTON ) tablet 25 mg  25 mg Oral  Daily Francesca Elsie CROME, MD   25 mg at 12/12/23 9078   citalopram  (CELEXA ) tablet 10 mg  10 mg Oral Daily Francesca Elsie CROME, MD   10 mg at 12/12/23 0921   donepezil (ARICEPT) tablet 10 mg  10 mg Oral QHS Francesca Elsie CROME, MD   10 mg at 12/11/23 2129   levETIRAcetam  (KEPPRA ) tablet 250 mg  250 mg Oral BID Francesca Elsie CROME, MD   250 mg at 12/12/23 9078   metoprolol  succinate (TOPROL -XL) 24 hr tablet 12.5 mg  12.5 mg Oral Daily Francesca Elsie CROME, MD   12.5 mg at 12/12/23 9077   potassium chloride  SA  (KLOR-CON  M) CR tablet 20 mEq  20 mEq Oral BID Raford Lenis, MD       potassium chloride  SA (KLOR-CON  M) CR tablet 40 mEq  40 mEq Oral Q4H Raford Lenis, MD   40 mEq at 12/12/23 1121   pravastatin  (PRAVACHOL ) tablet 40 mg  40 mg Oral QHS Francesca Elsie CROME, MD   40 mg at 12/11/23 2129   Current Outpatient Medications  Medication Sig Dispense Refill   acetaminophen  (TYLENOL ) 500 MG tablet Take 1,000 mg by mouth every 6 (six) hours as needed for moderate pain or headache.     aspirin  EC 81 MG tablet Take 81 mg by mouth daily. Swallow whole.     chlorthalidone  (HYGROTON ) 25 MG tablet Take 25 mg by mouth daily.     citalopram  (CELEXA ) 10 MG tablet Take 10 mg by mouth daily.     Colchicine 0.6 MG CAPS Take 1 capsule by mouth daily as needed (gout).     donepezil (ARICEPT) 10 MG tablet Take 10 mg by mouth at bedtime.     Hydrolyzed Silk (COGNIUM MEMORY EXTRA STR) TABS Take 1 tablet by mouth every evening.     levETIRAcetam  (KEPPRA ) 250 MG tablet Take 1 tablet (250 mg total) by mouth 2 (two) times daily. 180 tablet 3   metoprolol  succinate (TOPROL -XL) 25 MG 24 hr tablet Take 0.5 tablets (12.5 mg total) by mouth daily. (Patient taking differently: Take 25 mg by mouth daily.) 30 tablet 0   pravastatin  (PRAVACHOL ) 40 MG tablet Take 40 mg by mouth at bedtime.     Facility-Administered Medications Ordered in Other Encounters  Medication Dose Route Frequency Provider Last Rate Last Admin   0.9 %  sodium chloride  infusion   Intravenous Continuous Jule Ronal CROME, PA-C       acetaminophen  (TYLENOL ) tablet 650 mg  650 mg Oral Q6H PRN Jule Ronal CROME, PA-C       Or   acetaminophen  (TYLENOL ) suppository 650 mg  650 mg Rectal Q6H PRN Jule Ronal CROME, PA-C         Discharge Medications: Please see discharge summary for a list of discharge medications.  Relevant Imaging Results:  Relevant Lab Results:   Additional Information SSN: [id number]  [name], [place]

## 2023-12-13 NOTE — TOC CM/SW Note (Cosign Needed Addendum)
 Transition of Care (TOC) CM/SW Note   Dementia note  please be advised that the above-named patient has a primary diagnosis of dementia which supersedes any psychiatric diagnosis.

## 2023-12-13 NOTE — ED Provider Notes (Signed)
 Social work had spoke with son and due to denials for short term SNF ultimately was decided to take patient home. Patient has been psychiatrically cleared and had negative medical workup. Will be discharged to care of son.    Francesca Elsie CROME, MD 12/13/23 [phone]

## 2023-12-13 NOTE — ED Notes (Cosign Needed Addendum)
 CSW spoke with patient son this morning and shared with him the SNF denial from [facility] and [facility] and asked if it was fine to send referral out to [facility]. Patient agreeable and it was explained to him that due to patient [facility] Medicare waiver eligibility, it is certain facilities that are covered . CSW will await for bed offers, if not, will discuss next steps with son. CSW will continue to follow.   Addendum 1:14 pm  CSW sent son a message updating him on the continuous denials from other SNF's for short-term rehab.  CSW asked son if he wanted to try taking patient home with him [facility] and he said yes. CSW asked MD to place orders for HHPT/HHOT/ RN.  Son had no HH agency preference.    Addendum 2:50 pm CSW spoke with son about HH orders being placed and agency. Son stated that he would be here a little before 6pm to pick patient up and take her home. Staff was made aware. CSW signing off.

## 2023-12-15 LAB — CULTURE, BLOOD (ROUTINE X 2)
Culture: NO GROWTH
Culture: NO GROWTH

## 2023-12-16 DIAGNOSIS — I10 Essential (primary) hypertension: Secondary | ICD-10-CM | POA: Diagnosis not present

## 2023-12-16 DIAGNOSIS — G311 Senile degeneration of brain, not elsewhere classified: Secondary | ICD-10-CM | POA: Diagnosis not present

## 2023-12-16 DIAGNOSIS — R5383 Other fatigue: Secondary | ICD-10-CM | POA: Diagnosis not present

## 2023-12-16 DIAGNOSIS — Z66 Do not resuscitate: Secondary | ICD-10-CM | POA: Diagnosis not present

## 2023-12-16 DIAGNOSIS — R682 Dry mouth, unspecified: Secondary | ICD-10-CM | POA: Diagnosis not present

## 2023-12-16 DIAGNOSIS — F419 Anxiety disorder, unspecified: Secondary | ICD-10-CM | POA: Diagnosis not present

## 2023-12-16 DIAGNOSIS — R45851 Suicidal ideations: Secondary | ICD-10-CM | POA: Diagnosis not present

## 2023-12-16 DIAGNOSIS — R52 Pain, unspecified: Secondary | ICD-10-CM | POA: Diagnosis not present

## 2023-12-16 DIAGNOSIS — Z743 Need for continuous supervision: Secondary | ICD-10-CM | POA: Diagnosis not present

## 2023-12-16 DIAGNOSIS — R531 Weakness: Secondary | ICD-10-CM | POA: Diagnosis not present

## 2023-12-16 DIAGNOSIS — M6281 Muscle weakness (generalized): Secondary | ICD-10-CM | POA: Diagnosis not present

## 2023-12-17 DIAGNOSIS — R682 Dry mouth, unspecified: Secondary | ICD-10-CM | POA: Diagnosis not present

## 2023-12-17 DIAGNOSIS — Z66 Do not resuscitate: Secondary | ICD-10-CM | POA: Diagnosis not present

## 2023-12-17 DIAGNOSIS — F419 Anxiety disorder, unspecified: Secondary | ICD-10-CM | POA: Diagnosis not present

## 2023-12-17 DIAGNOSIS — G311 Senile degeneration of brain, not elsewhere classified: Secondary | ICD-10-CM | POA: Diagnosis not present

## 2023-12-17 DIAGNOSIS — R45851 Suicidal ideations: Secondary | ICD-10-CM | POA: Diagnosis not present

## 2023-12-17 DIAGNOSIS — R52 Pain, unspecified: Secondary | ICD-10-CM | POA: Diagnosis not present

## 2023-12-18 DIAGNOSIS — F419 Anxiety disorder, unspecified: Secondary | ICD-10-CM | POA: Diagnosis not present

## 2023-12-18 DIAGNOSIS — G311 Senile degeneration of brain, not elsewhere classified: Secondary | ICD-10-CM | POA: Diagnosis not present

## 2023-12-18 DIAGNOSIS — R45851 Suicidal ideations: Secondary | ICD-10-CM | POA: Diagnosis not present

## 2023-12-18 DIAGNOSIS — Z66 Do not resuscitate: Secondary | ICD-10-CM | POA: Diagnosis not present

## 2023-12-18 DIAGNOSIS — R682 Dry mouth, unspecified: Secondary | ICD-10-CM | POA: Diagnosis not present

## 2023-12-18 DIAGNOSIS — R52 Pain, unspecified: Secondary | ICD-10-CM | POA: Diagnosis not present

## 2023-12-19 DIAGNOSIS — R45851 Suicidal ideations: Secondary | ICD-10-CM | POA: Diagnosis not present

## 2023-12-19 DIAGNOSIS — G311 Senile degeneration of brain, not elsewhere classified: Secondary | ICD-10-CM | POA: Diagnosis not present

## 2023-12-19 DIAGNOSIS — R682 Dry mouth, unspecified: Secondary | ICD-10-CM | POA: Diagnosis not present

## 2023-12-19 DIAGNOSIS — F419 Anxiety disorder, unspecified: Secondary | ICD-10-CM | POA: Diagnosis not present

## 2023-12-19 DIAGNOSIS — R52 Pain, unspecified: Secondary | ICD-10-CM | POA: Diagnosis not present

## 2023-12-19 DIAGNOSIS — Z66 Do not resuscitate: Secondary | ICD-10-CM | POA: Diagnosis not present

## 2023-12-22 DIAGNOSIS — R52 Pain, unspecified: Secondary | ICD-10-CM | POA: Diagnosis not present

## 2023-12-22 DIAGNOSIS — F419 Anxiety disorder, unspecified: Secondary | ICD-10-CM | POA: Diagnosis not present

## 2023-12-22 DIAGNOSIS — R682 Dry mouth, unspecified: Secondary | ICD-10-CM | POA: Diagnosis not present

## 2023-12-22 DIAGNOSIS — R45851 Suicidal ideations: Secondary | ICD-10-CM | POA: Diagnosis not present

## 2023-12-22 DIAGNOSIS — Z66 Do not resuscitate: Secondary | ICD-10-CM | POA: Diagnosis not present

## 2023-12-22 DIAGNOSIS — G311 Senile degeneration of brain, not elsewhere classified: Secondary | ICD-10-CM | POA: Diagnosis not present

## 2023-12-25 DIAGNOSIS — Z66 Do not resuscitate: Secondary | ICD-10-CM | POA: Diagnosis not present

## 2023-12-25 DIAGNOSIS — R682 Dry mouth, unspecified: Secondary | ICD-10-CM | POA: Diagnosis not present

## 2023-12-25 DIAGNOSIS — R45851 Suicidal ideations: Secondary | ICD-10-CM | POA: Diagnosis not present

## 2023-12-25 DIAGNOSIS — R52 Pain, unspecified: Secondary | ICD-10-CM | POA: Diagnosis not present

## 2023-12-25 DIAGNOSIS — F419 Anxiety disorder, unspecified: Secondary | ICD-10-CM | POA: Diagnosis not present

## 2023-12-25 DIAGNOSIS — G311 Senile degeneration of brain, not elsewhere classified: Secondary | ICD-10-CM | POA: Diagnosis not present

## 2023-12-26 DIAGNOSIS — G311 Senile degeneration of brain, not elsewhere classified: Secondary | ICD-10-CM | POA: Diagnosis not present

## 2023-12-26 DIAGNOSIS — Z66 Do not resuscitate: Secondary | ICD-10-CM | POA: Diagnosis not present

## 2023-12-26 DIAGNOSIS — R52 Pain, unspecified: Secondary | ICD-10-CM | POA: Diagnosis not present

## 2023-12-26 DIAGNOSIS — F419 Anxiety disorder, unspecified: Secondary | ICD-10-CM | POA: Diagnosis not present

## 2023-12-26 DIAGNOSIS — R682 Dry mouth, unspecified: Secondary | ICD-10-CM | POA: Diagnosis not present

## 2023-12-26 DIAGNOSIS — R45851 Suicidal ideations: Secondary | ICD-10-CM | POA: Diagnosis not present

## 2024-01-15 DEATH — deceased

## 2024-04-23 ENCOUNTER — Ambulatory Visit: Admitting: Neurology
